# Patient Record
Sex: Female | Born: 1960
Health system: Southern US, Community
[De-identification: ages and names within clinical notes are randomized; demographics above are authoritative.]

## PROBLEM LIST (undated history)

## (undated) DIAGNOSIS — G8929 Other chronic pain: Secondary | ICD-10-CM

## (undated) DIAGNOSIS — J40 Bronchitis, not specified as acute or chronic: Secondary | ICD-10-CM

## (undated) DIAGNOSIS — M65311 Trigger thumb, right thumb: Secondary | ICD-10-CM

## (undated) DIAGNOSIS — E079 Disorder of thyroid, unspecified: Secondary | ICD-10-CM

## (undated) DIAGNOSIS — I10 Essential (primary) hypertension: Secondary | ICD-10-CM

## (undated) DIAGNOSIS — K760 Fatty (change of) liver, not elsewhere classified: Secondary | ICD-10-CM

## (undated) DIAGNOSIS — I639 Cerebral infarction, unspecified: Secondary | ICD-10-CM

## (undated) DIAGNOSIS — M549 Dorsalgia, unspecified: Secondary | ICD-10-CM

## (undated) DIAGNOSIS — E039 Hypothyroidism, unspecified: Secondary | ICD-10-CM

## (undated) DIAGNOSIS — N189 Chronic kidney disease, unspecified: Secondary | ICD-10-CM

## (undated) DIAGNOSIS — M1990 Unspecified osteoarthritis, unspecified site: Secondary | ICD-10-CM

## (undated) HISTORY — DX: Fatty (change of) liver, not elsewhere classified: K76.0

## (undated) HISTORY — PX: OOPHORECTOMY: SHX86

---

## 1991-10-28 HISTORY — PX: BACK SURGERY: SHX140

## 1998-03-28 ENCOUNTER — Emergency Department (HOSPITAL_COMMUNITY): Admission: EM | Admit: 1998-03-28 | Discharge: 1998-03-28 | Payer: Self-pay | Admitting: Emergency Medicine

## 1998-09-13 ENCOUNTER — Inpatient Hospital Stay (HOSPITAL_COMMUNITY): Admission: AD | Admit: 1998-09-13 | Discharge: 1998-09-13 | Payer: Self-pay | Admitting: Obstetrics & Gynecology

## 1998-11-30 ENCOUNTER — Emergency Department (HOSPITAL_COMMUNITY): Admission: EM | Admit: 1998-11-30 | Discharge: 1998-11-30 | Payer: Self-pay | Admitting: Internal Medicine

## 1998-11-30 ENCOUNTER — Encounter: Payer: Self-pay | Admitting: Internal Medicine

## 1999-01-16 ENCOUNTER — Encounter: Payer: Self-pay | Admitting: Emergency Medicine

## 1999-01-16 ENCOUNTER — Emergency Department (HOSPITAL_COMMUNITY): Admission: EM | Admit: 1999-01-16 | Discharge: 1999-01-16 | Payer: Self-pay | Admitting: Emergency Medicine

## 1999-11-08 ENCOUNTER — Emergency Department (HOSPITAL_COMMUNITY): Admission: EM | Admit: 1999-11-08 | Discharge: 1999-11-08 | Payer: Self-pay | Admitting: Emergency Medicine

## 1999-11-08 ENCOUNTER — Encounter: Payer: Self-pay | Admitting: Emergency Medicine

## 1999-12-09 ENCOUNTER — Encounter: Admission: RE | Admit: 1999-12-09 | Discharge: 1999-12-09 | Payer: Self-pay | Admitting: Internal Medicine

## 1999-12-24 ENCOUNTER — Ambulatory Visit (HOSPITAL_COMMUNITY): Admission: RE | Admit: 1999-12-24 | Discharge: 1999-12-24 | Payer: Self-pay | Admitting: *Deleted

## 2000-04-06 ENCOUNTER — Encounter: Payer: Self-pay | Admitting: Obstetrics

## 2000-04-06 ENCOUNTER — Inpatient Hospital Stay (HOSPITAL_COMMUNITY): Admission: AD | Admit: 2000-04-06 | Discharge: 2000-04-06 | Payer: Self-pay | Admitting: Obstetrics

## 2000-04-14 ENCOUNTER — Encounter: Payer: Self-pay | Admitting: Emergency Medicine

## 2000-04-14 ENCOUNTER — Emergency Department (HOSPITAL_COMMUNITY): Admission: EM | Admit: 2000-04-14 | Discharge: 2000-04-14 | Payer: Self-pay | Admitting: Emergency Medicine

## 2000-04-18 ENCOUNTER — Inpatient Hospital Stay (HOSPITAL_COMMUNITY): Admission: AD | Admit: 2000-04-18 | Discharge: 2000-04-18 | Payer: Self-pay | Admitting: Obstetrics

## 2000-06-09 ENCOUNTER — Encounter: Admission: RE | Admit: 2000-06-09 | Discharge: 2000-06-09 | Payer: Self-pay | Admitting: Obstetrics & Gynecology

## 2000-06-10 ENCOUNTER — Encounter: Admission: RE | Admit: 2000-06-10 | Discharge: 2000-06-10 | Payer: Self-pay | Admitting: Hematology and Oncology

## 2000-07-20 ENCOUNTER — Encounter: Payer: Self-pay | Admitting: Obstetrics

## 2000-07-21 ENCOUNTER — Ambulatory Visit (HOSPITAL_COMMUNITY): Admission: RE | Admit: 2000-07-21 | Discharge: 2000-07-21 | Payer: Self-pay | Admitting: Obstetrics

## 2000-07-21 ENCOUNTER — Encounter (INDEPENDENT_AMBULATORY_CARE_PROVIDER_SITE_OTHER): Payer: Self-pay | Admitting: Specialist

## 2000-08-04 ENCOUNTER — Encounter: Admission: RE | Admit: 2000-08-04 | Discharge: 2000-08-04 | Payer: Self-pay | Admitting: Obstetrics & Gynecology

## 2002-03-16 ENCOUNTER — Emergency Department (HOSPITAL_COMMUNITY): Admission: EM | Admit: 2002-03-16 | Discharge: 2002-03-16 | Payer: Self-pay | Admitting: Emergency Medicine

## 2003-03-16 ENCOUNTER — Inpatient Hospital Stay (HOSPITAL_COMMUNITY): Admission: AD | Admit: 2003-03-16 | Discharge: 2003-03-16 | Payer: Self-pay | Admitting: Obstetrics

## 2003-03-30 ENCOUNTER — Encounter: Payer: Self-pay | Admitting: Obstetrics

## 2003-03-30 ENCOUNTER — Inpatient Hospital Stay (HOSPITAL_COMMUNITY): Admission: AD | Admit: 2003-03-30 | Discharge: 2003-04-01 | Payer: Self-pay | Admitting: Obstetrics

## 2003-03-30 ENCOUNTER — Ambulatory Visit (HOSPITAL_COMMUNITY): Admission: RE | Admit: 2003-03-30 | Discharge: 2003-03-30 | Payer: Self-pay | Admitting: Obstetrics

## 2003-09-04 ENCOUNTER — Emergency Department (HOSPITAL_COMMUNITY): Admission: EM | Admit: 2003-09-04 | Discharge: 2003-09-04 | Payer: Self-pay | Admitting: Emergency Medicine

## 2004-03-29 ENCOUNTER — Ambulatory Visit (HOSPITAL_COMMUNITY): Admission: RE | Admit: 2004-03-29 | Discharge: 2004-03-29 | Payer: Self-pay | Admitting: Obstetrics

## 2004-08-08 ENCOUNTER — Emergency Department (HOSPITAL_COMMUNITY): Admission: EM | Admit: 2004-08-08 | Discharge: 2004-08-09 | Payer: Self-pay

## 2004-12-04 ENCOUNTER — Ambulatory Visit (HOSPITAL_COMMUNITY): Admission: RE | Admit: 2004-12-04 | Discharge: 2004-12-04 | Payer: Self-pay | Admitting: Obstetrics

## 2004-12-07 ENCOUNTER — Emergency Department (HOSPITAL_COMMUNITY): Admission: AD | Admit: 2004-12-07 | Discharge: 2004-12-07 | Payer: Self-pay | Admitting: Family Medicine

## 2005-05-02 ENCOUNTER — Emergency Department (HOSPITAL_COMMUNITY): Admission: EM | Admit: 2005-05-02 | Discharge: 2005-05-02 | Payer: Self-pay | Admitting: Family Medicine

## 2005-06-05 ENCOUNTER — Emergency Department (HOSPITAL_COMMUNITY): Admission: EM | Admit: 2005-06-05 | Discharge: 2005-06-05 | Payer: Self-pay | Admitting: Emergency Medicine

## 2006-01-04 ENCOUNTER — Emergency Department (HOSPITAL_COMMUNITY): Admission: EM | Admit: 2006-01-04 | Discharge: 2006-01-04 | Payer: Self-pay | Admitting: Family Medicine

## 2006-06-02 ENCOUNTER — Emergency Department (HOSPITAL_COMMUNITY): Admission: EM | Admit: 2006-06-02 | Discharge: 2006-06-02 | Payer: Self-pay | Admitting: Family Medicine

## 2006-09-01 ENCOUNTER — Ambulatory Visit (HOSPITAL_COMMUNITY): Admission: RE | Admit: 2006-09-01 | Discharge: 2006-09-01 | Payer: Self-pay | Admitting: Obstetrics

## 2006-12-25 ENCOUNTER — Emergency Department (HOSPITAL_COMMUNITY): Admission: EM | Admit: 2006-12-25 | Discharge: 2006-12-25 | Payer: Self-pay | Admitting: Emergency Medicine

## 2007-04-08 ENCOUNTER — Emergency Department (HOSPITAL_COMMUNITY): Admission: EM | Admit: 2007-04-08 | Discharge: 2007-04-08 | Payer: Self-pay | Admitting: Emergency Medicine

## 2007-04-27 ENCOUNTER — Ambulatory Visit (HOSPITAL_COMMUNITY): Admission: RE | Admit: 2007-04-27 | Discharge: 2007-04-27 | Payer: Self-pay | Admitting: Obstetrics

## 2007-08-26 ENCOUNTER — Emergency Department (HOSPITAL_COMMUNITY): Admission: EM | Admit: 2007-08-26 | Discharge: 2007-08-26 | Payer: Self-pay | Admitting: Emergency Medicine

## 2007-11-10 ENCOUNTER — Emergency Department (HOSPITAL_COMMUNITY): Admission: EM | Admit: 2007-11-10 | Discharge: 2007-11-10 | Payer: Self-pay | Admitting: Emergency Medicine

## 2008-01-13 ENCOUNTER — Ambulatory Visit (HOSPITAL_COMMUNITY): Admission: RE | Admit: 2008-01-13 | Discharge: 2008-01-13 | Payer: Self-pay | Admitting: Obstetrics

## 2008-02-02 ENCOUNTER — Ambulatory Visit: Payer: Self-pay | Admitting: Gastroenterology

## 2008-02-16 ENCOUNTER — Ambulatory Visit: Payer: Self-pay | Admitting: Gastroenterology

## 2008-02-16 ENCOUNTER — Encounter: Payer: Self-pay | Admitting: Gastroenterology

## 2008-04-22 ENCOUNTER — Emergency Department (HOSPITAL_COMMUNITY): Admission: EM | Admit: 2008-04-22 | Discharge: 2008-04-22 | Payer: Self-pay | Admitting: Emergency Medicine

## 2008-07-14 ENCOUNTER — Emergency Department (HOSPITAL_COMMUNITY): Admission: EM | Admit: 2008-07-14 | Discharge: 2008-07-14 | Payer: Self-pay | Admitting: Emergency Medicine

## 2008-09-23 ENCOUNTER — Emergency Department (HOSPITAL_COMMUNITY): Admission: EM | Admit: 2008-09-23 | Discharge: 2008-09-24 | Payer: Self-pay | Admitting: Emergency Medicine

## 2008-10-24 ENCOUNTER — Emergency Department (HOSPITAL_COMMUNITY): Admission: EM | Admit: 2008-10-24 | Discharge: 2008-10-24 | Payer: Self-pay | Admitting: Family Medicine

## 2008-12-13 ENCOUNTER — Emergency Department (HOSPITAL_COMMUNITY): Admission: EM | Admit: 2008-12-13 | Discharge: 2008-12-13 | Payer: Self-pay | Admitting: Family Medicine

## 2009-01-18 ENCOUNTER — Ambulatory Visit (HOSPITAL_COMMUNITY): Admission: RE | Admit: 2009-01-18 | Discharge: 2009-01-18 | Payer: Self-pay | Admitting: Internal Medicine

## 2009-02-06 ENCOUNTER — Emergency Department (HOSPITAL_COMMUNITY): Admission: EM | Admit: 2009-02-06 | Discharge: 2009-02-06 | Payer: Self-pay | Admitting: Emergency Medicine

## 2009-02-06 ENCOUNTER — Ambulatory Visit (HOSPITAL_COMMUNITY): Admission: RE | Admit: 2009-02-06 | Discharge: 2009-02-06 | Payer: Self-pay | Admitting: Obstetrics

## 2009-03-15 ENCOUNTER — Emergency Department (HOSPITAL_COMMUNITY): Admission: EM | Admit: 2009-03-15 | Discharge: 2009-03-15 | Payer: Self-pay | Admitting: Emergency Medicine

## 2009-03-18 ENCOUNTER — Emergency Department (HOSPITAL_COMMUNITY): Admission: EM | Admit: 2009-03-18 | Discharge: 2009-03-18 | Payer: Self-pay | Admitting: Emergency Medicine

## 2009-03-30 ENCOUNTER — Emergency Department (HOSPITAL_COMMUNITY): Admission: EM | Admit: 2009-03-30 | Discharge: 2009-03-30 | Payer: Self-pay | Admitting: Family Medicine

## 2009-12-10 ENCOUNTER — Emergency Department (HOSPITAL_COMMUNITY): Admission: EM | Admit: 2009-12-10 | Discharge: 2009-12-10 | Payer: Self-pay | Admitting: Emergency Medicine

## 2010-03-07 ENCOUNTER — Emergency Department (HOSPITAL_COMMUNITY): Admission: EM | Admit: 2010-03-07 | Discharge: 2010-03-07 | Payer: Self-pay | Admitting: Emergency Medicine

## 2010-04-18 ENCOUNTER — Encounter: Admission: RE | Admit: 2010-04-18 | Discharge: 2010-04-18 | Payer: Self-pay | Admitting: Orthopaedic Surgery

## 2010-05-07 ENCOUNTER — Emergency Department (HOSPITAL_COMMUNITY): Admission: EM | Admit: 2010-05-07 | Discharge: 2010-05-07 | Payer: Self-pay | Admitting: Emergency Medicine

## 2010-05-16 ENCOUNTER — Ambulatory Visit (HOSPITAL_COMMUNITY): Admission: RE | Admit: 2010-05-16 | Discharge: 2010-05-16 | Payer: Self-pay | Admitting: Obstetrics

## 2010-06-07 ENCOUNTER — Emergency Department (HOSPITAL_COMMUNITY): Admission: EM | Admit: 2010-06-07 | Discharge: 2010-06-07 | Payer: Self-pay | Admitting: Emergency Medicine

## 2010-10-09 ENCOUNTER — Encounter: Admission: RE | Admit: 2010-10-09 | Payer: Self-pay | Source: Home / Self Care | Admitting: Internal Medicine

## 2010-10-30 ENCOUNTER — Encounter: Admission: RE | Admit: 2010-10-30 | Payer: Self-pay | Source: Home / Self Care | Admitting: Internal Medicine

## 2010-11-17 ENCOUNTER — Encounter: Payer: Self-pay | Admitting: Obstetrics

## 2010-11-18 ENCOUNTER — Emergency Department (HOSPITAL_COMMUNITY)
Admission: EM | Admit: 2010-11-18 | Discharge: 2010-11-18 | Payer: Self-pay | Source: Home / Self Care | Admitting: Emergency Medicine

## 2011-01-12 LAB — CBC
HCT: 39.7 % (ref 36.0–46.0)
Hemoglobin: 13.9 g/dL (ref 12.0–15.0)
MCH: 31.9 pg (ref 26.0–34.0)
MCHC: 35 g/dL (ref 30.0–36.0)
MCV: 91 fL (ref 78.0–100.0)
Platelets: 244 10*3/uL (ref 150–400)
RBC: 4.36 MIL/uL (ref 3.87–5.11)
RDW: 13.1 % (ref 11.5–15.5)
WBC: 5.8 10*3/uL (ref 4.0–10.5)

## 2011-01-12 LAB — DIFFERENTIAL
Basophils Absolute: 0 10*3/uL (ref 0.0–0.1)
Basophils Relative: 1 % (ref 0–1)
Eosinophils Absolute: 0.1 10*3/uL (ref 0.0–0.7)
Eosinophils Relative: 2 % (ref 0–5)
Lymphocytes Relative: 54 % — ABNORMAL HIGH (ref 12–46)
Lymphs Abs: 3.1 10*3/uL (ref 0.7–4.0)
Monocytes Absolute: 0.6 10*3/uL (ref 0.1–1.0)
Monocytes Relative: 10 % (ref 3–12)
Neutro Abs: 1.9 10*3/uL (ref 1.7–7.7)
Neutrophils Relative %: 33 % — ABNORMAL LOW (ref 43–77)

## 2011-01-12 LAB — BASIC METABOLIC PANEL
BUN: 10 mg/dL (ref 6–23)
CO2: 29 mEq/L (ref 19–32)
Calcium: 9.1 mg/dL (ref 8.4–10.5)
Chloride: 106 mEq/L (ref 96–112)
Creatinine, Ser: 0.86 mg/dL (ref 0.4–1.2)
GFR calc Af Amer: >60 mL/min (ref >60)
GFR calc non Af Amer: >60 mL/min (ref >60)
Glucose, Bld: 141 mg/dL — ABNORMAL HIGH (ref 70–99)
Potassium: 3.5 mEq/L (ref 3.5–5.1)
Sodium: 140 mEq/L (ref 135–145)

## 2011-01-17 LAB — POCT I-STAT, CHEM 8
BUN: 10 mg/dL (ref 6–23)
Calcium, Ion: 1.19 mmol/L (ref 1.12–1.32)
Chloride: 102 mEq/L (ref 96–112)
Creatinine, Ser: 1 mg/dL (ref 0.4–1.2)
Glucose, Bld: 153 mg/dL — ABNORMAL HIGH (ref 70–99)
HCT: 48 % — ABNORMAL HIGH (ref 36.0–46.0)
Hemoglobin: 16.3 g/dL — ABNORMAL HIGH (ref 12.0–15.0)
Potassium: 4.6 mEq/L (ref 3.5–5.1)
Sodium: 136 mEq/L (ref 135–145)
TCO2: 30 mmol/L (ref 0–100)

## 2011-01-17 LAB — URINALYSIS, ROUTINE W REFLEX MICROSCOPIC
Bilirubin Urine: NEGATIVE
Glucose, UA: NEGATIVE mg/dL
Hgb urine dipstick: NEGATIVE
Ketones, ur: NEGATIVE mg/dL
Nitrite: NEGATIVE
Protein, ur: NEGATIVE mg/dL
Specific Gravity, Urine: 1.02 (ref 1.005–1.030)
Urobilinogen, UA: 0.2 mg/dL (ref 0.0–1.0)
pH: 5.5 (ref 5.0–8.0)

## 2011-03-11 NOTE — Letter (Signed)
February 02, 2008    Charles A. Clearance Coots, M.D.  [address]  [place], [place]  [address]   RE:  NEKESHA, FONT  MRN:  [id number]  /  DOB:  October 24, 1961   Dear Dr. Clearance Coots:   Upon your kind referral, I had the pleasure of evaluating your patient  and I am pleased to offer my findings.  I saw Ms. Wyble in the office  today.  Enclosed is a copy of my progress note that details my findings  and recommendations.   Thank you for the opportunity to participate in your patient's care.    Sincerely,      Barbette Hair. Arlyce Dice, MD,FACG  Electronically Signed    RDK/MedQ  DD: 02/02/2008  DT: 02/02/2008  Job #: [id number]

## 2011-03-11 NOTE — Assessment & Plan Note (Signed)
[facility]                         GASTROENTEROLOGY OFFICE NOTE   MAKAI, AGOSTINELLI                      MRN:          [id number]  DATE:02/02/2008                            DOB:          1961-05-08    REASON FOR CONSULTATION:  Abdominal pain.   HISTORY OF PRESENT ILLNESS:  Margaret Webster is a 50 year old African American  female referred through the courtesy of Dr. Clearance Coots for evaluation.  For  several weeks, she has been complaining of mid abdominal pain.  Pain is  invariably worse postprandially.  It is poorly described, but appears  like an aching-type pain that radiates across both sides but not through  to the back.  She has had intermittent pyrosis and nausea.  There has  been no change in her bowel habits.  She has mild chronic constipation.  She has taken Zantac and short courses of PPI therapy with relief, only  for the pain to return.  She notes that the pain has occurred when she  has had two different sets of steroid injections.  She does not take  nonsteroidals because of stomach upset.  There is no history of melena  or hematochezia.  She also complains of rectal pain which she attributes  to hemorrhoids.  She has had no bleeding.   PAST MEDICAL HISTORY:  1. Arthritis.  2. Sleep apnea.  3. Slipped disk and is on disability.  4. Status post oophorectomy and back surgery.   FAMILY HISTORY:  Pertinent for daughter with ovarian cancer.   ALLERGIES:  Ibuprofen and Naprosyn.   MEDICATIONS:  Citalopram daily.   SOCIAL HISTORY:  She is single and on disability.  She smokes half a  pack a day.  She drinks rarely.   REVIEW OF SYSTEMS:  Pertinent for feet swelling, joint pains, sleeping  problems, back pain, headaches, anxiety, depression, muscle pains.  Remainder of review of systems was negative.   PHYSICAL EXAMINATION:  VITAL SIGNS:  Pulse 106, blood pressure 120/68,  weight 229.  HEENT: EOMI.  PERRLA.  Sclerae are anicteric.   Conjunctivae are pink.  NECK:  Supple without thyromegaly, adenopathy or carotid bruits.  CHEST:  Clear to auscultation and percussion without adventitious  sounds.  CARDIAC:  Regular rhythm; normal S1 S2.  There are no murmurs, gallops  or rubs.  ABDOMEN:  Bowel sounds are normoactive.  Abdomen is soft, nontender and  nondistended.  There are no abdominal masses, tenderness, splenic  enlargement or hepatomegaly.  EXTREMITIES:  Full range of motion.  No cyanosis, clubbing or edema.  RECTAL:  External hemorrhoids.  There are no masses.  Stool is Hemoccult  negative.  MUSCULOSKELETAL:  Without deformities.  NEUROLOGICAL:  Alert and oriented x3.  No focal abnormalities.   IMPRESSION:  1. Dyspepsia.  This could be due to ulcer or nonulcerative dyspepsia.  2. GERD.  3. Symptomatic hemorrhoids.   RECOMMENDATION:  1. Upper endoscopy.  2. Begin Protonix 40 mg daily.  3. Anusol-HC suppositories.     Barbette Hair. Arlyce Dice, MD,FACG  Electronically Signed    RDK/MedQ  DD: 02/02/2008  DT: 02/02/2008  Job #:  [id number]   cc:   Fleet Contras, M.D.  Charles A. Clearance Coots, M.D.

## 2011-03-11 NOTE — Letter (Signed)
February 02, 2008    [name]   RE:  CORALYNN, GAONA  MRN:  [id number]  /  DOB:  12-24-1960   Dear Ms. Manson Passey:   It is my pleasure to have treated you recently as a new patient in my  office.  I appreciate your confidence and the opportunity to participate  in your care.   Since I do have a busy inpatient endoscopy schedule and office schedule,  my office hours vary weekly.  I am, however, available for emergency  calls every day through my office.  If I cannot promptly meet an urgent  office appointment, another one of our gastroenterologists will be able  to assist you.   My well-trained staff are prepared to help you at all times.  For  emergencies after office hours, a physician from our gastroenterology  section is always available through my 24-hour answering service.   While you are under my care, I encourage discussion of your questions  and concerns, and I will be happy to return your calls as soon as I am  available.   Once again, I welcome you as a new patient and I look forward to a happy  and healthy relationship.    Sincerely,      Barbette Hair. Arlyce Dice, MD,FACG  Electronically Signed   RDK/MedQ  DD: 02/02/2008  DT: 02/02/2008  Job #: [id number]

## 2011-03-14 NOTE — Op Note (Signed)
NAME:  Margaret Webster, Margaret Webster                         ACCOUNT NO.:  [id number]   MEDICAL RECORD NO.:  [id number]                   PATIENT TYPE:  AMB   LOCATION:  SDC                                  FACILITY:  [facility]   PHYSICIAN:  Charles A. Clearance Coots, M.D.             DATE OF BIRTH:  07-01-1961   DATE OF PROCEDURE:  03/29/2004  DATE OF DISCHARGE:                                 OPERATIVE REPORT   PREOPERATIVE DIAGNOSIS:  Metrorrhagia.   POSTOPERATIVE DIAGNOSIS:  Metrorrhagia.   PROCEDURE:  Bipolar endometrial ablation (Novasure).   SURGEON:  Charles A. Clearance Coots, M.D.   ANESTHESIA:  General.   ESTIMATED BLOOD LOSS:  Negligible.   COMPLICATIONS:  None.   SPECIMENS:  None.   OPERATION:  The patient was brought to the operating room and after  satisfactory general endotracheal anesthesia, the legs were brought up in  stirrups and the vagina was prepped and draped in the usual sterile fashion.  The urinary bladder was emptied of approximately 100 mL of clear urine.  Bimanual examination revealed the uterus to be about eight weeks size,  retroverted.  A sterile speculum was inserted in the vaginal vault and the  cervix was isolated.  The anterior lip of the cervix was grasped with a  single tooth tenaculum.  The uterine cavity was sounded to 8.5 cm.  The  Hegar dilator was used to measure the distance to the internal os which was  3 cm.  Hysteroscopic survey was then performed.  The bipolar endometrial  ablation technique was then performed in a routine fashion after  hysteroscopy.  The power used was 79 watts and time of ablation was 60  seconds.  The patient tolerated the procedure well.  A post ablation  hysteroscopy was then performed.  All instruments were retired.  The patient  tolerated the procedure well and was transferred to the recovery room in  satisfactory condition.                                               Charles A. Clearance Coots, M.D.    CAH/MEDQ  D:  03/29/2004  T:   03/29/2004  Job:  [id number]

## 2011-03-14 NOTE — Op Note (Signed)
[facility]  Patient:    Margaret Webster, Margaret Webster                        MRN: [id number] Proc. Date: 07/21/00 Attending:  Kathreen Cosier, M.D.                           Operative Report  PREOPERATIVE DIAGNOSIS:       Dysfunctional uterine bleeding.  PROCEDURE:                    Diagnostic dilatation and curettage.  SURGEON:                      Kathreen Cosier, M.D.   DESCRIPTION OF PROCEDURE:     Using MAC with the patient in the lithotomy position, the perineum and vagina were prepped and draped.  The bladder was entered with a straight catheter.  A bimanual examination revealed a top normal sized uterus ______ and adnexa.   A weighted speculum was placed in the vagina.  The cervix was injected at 3, 9, and 12 oclock with 1% Xylocaine, a total of 5%.  The anterior lip of the cervix was grasped with the tenaculum.  The endocervix was curetted.  A small amount of tissue was obtained.  The endometrial cavity was sounded to 9 cm.  The cervix was dilated with #25 Shawnie Pons and the endometrium curetted, and a small amount of tissue obtained.  Polyp forceps were inserted.  No polyps were obtained.  The patient tolerated the procedure well and was taken to the recovery room in good condition. DD:  07/21/00 TD:  07/21/00 Job: [id number]

## 2011-03-14 NOTE — Discharge Summary (Signed)
NAME:  Margaret Webster, Margaret Webster                         ACCOUNT NO.:  [id number]   MEDICAL RECORD NO.:  [id number]                   PATIENT TYPE:  INP   LOCATION:  9304                                 FACILITY:  [facility]   PHYSICIAN:  Charles A. Clearance Coots, M.D.             DATE OF BIRTH:  [birth year]   DATE OF ADMISSION:  03/30/2003  DATE OF DISCHARGE:  04/01/2003                                 DISCHARGE SUMMARY   ADMITTING DIAGNOSES:  Acute pelvic inflammatory disease.   DISCHARGE DIAGNOSES:  Acute pelvic inflammatory disease.   DISPOSITION:  Discharged home improved after IV antibiotic therapy.   REASON FOR ADMISSION:  A 50 year old black female G1, P1.  Last menstrual  period February 24, 2003.  Last Pap smear February 13, 2003 within normal limits.  Last mammogram February 13, 2002 was normal.  The patient presented to the  [facility] for complaint of abnormal bleeding during the month of  April.  Periods are usually regular but she had onset of her period on January 29, 2003 and bled until February 24, 2003.  She also complained of right lower  quadrant pain.  She is no contraception.   PAST SURGICAL HISTORY:  1. Back surgery.  2. Left ovarian cystectomy.  3. D&C.   PAST MEDICAL HISTORY:  1. Irritable bowel syndrome.  2. Ear infection.   MEDICATIONS:  1. Zelnorm.  2. Amoxicillin.  3. Multivitamins.  4. Decongestant for ear infection.   ALLERGIES:  No known drug allergies.   SOCIAL HISTORY:  Single.  Positive for tobacco and alcohol use.  Negative  for recreational drug use.   FAMILY HISTORY:  Positive for stroke, diabetes, Alzheimer's disease.  Negative for gynecologic cancer.   REVIEW OF SYSTEMS:  Noncontributory except for history of present illness.   PHYSICAL EXAMINATION:  GENERAL:  Well-nourished, well-developed black female  in no acute distress.  VITAL SIGNS:  Temperature 98.5, pulse 92, blood pressure 130/92.  HEENT:  Within normal limits.  LUNGS:  Clear to  auscultation bilaterally.  HEART:  Regular rate and rhythm.  ABDOMEN:  Soft.  Positive right lower quadrant tenderness.  No masses  appreciated.  PELVIC:  Normal external female genitalia.  Vaginal mucosa normal.  Cervix  is parous.  Scant amount of menstrual like blood from the cervical os.  Endometrial biopsy was done at the time of examination.  GC and Chlamydia  cultures were done.  Uterus was normal size, shape, and contour, mid  position, nontender.  The adnexa positive for right adnexal tenderness.  No  masses were appreciated.   LABORATORIES:  GC culture was positive.  Ultrasound examination revealed  normal uterus and ovaries and a scant amount of free fluid around the right  ovary.   IMPRESSION:  1. Acute pelvic inflammatory disease.  2. Menometrorrhagia.   PLAN:  Admit for IV antibiotic therapy.   ADMITTING LABORATORY VALUES:  Hemoglobin 13.9, hematocrit  39.7, white blood  cell count 8200, platelets 324,000.  Comprehensive metabolic panel was  within normal limits.  GC culture was positive.   HOSPITAL COURSE:  The patient was admitted and received IV antibiotic  therapy.  She was started on Unasyn 3 g IV q.6h. along with doxycycline 100  mg p.o. b.i.d.  Responded well to IV antibiotic therapy and was discharged  home on hospital day #2 much improved, nontender on examination to complete  a 10-day course of p.o. antibiotic therapy with doxycycline and Flagyl.   DISCHARGE DISPOSITION:   DISCHARGE MEDICATIONS:  1. Doxycycline 100 mg p.o. b.i.d. for eight days.  2. Flagyl 500 mg b.i.d. for eight days.   FOLLOWUP:  The patient is to follow up at the [facility] for a  reexamination in two to three days.   DISCHARGE INSTRUCTIONS:  Routine written instructions were given for  gynecologic discharge.                                               Charles A. Clearance Coots, M.D.    CAH/MEDQ  D:  06/15/2003  T:  06/15/2003  Job:  [id number]

## 2011-03-14 NOTE — H&P (Signed)
NAME:  Margaret Webster, Margaret Webster                         ACCOUNT NO.:  [id number]   MEDICAL RECORD NO.:  [id number]                   PATIENT TYPE:  OUT   LOCATION:  ULT                                  [facility]ILITY:  [facility]   PHYSICIAN:  Charles A. Clearance Coots, M.D.             DATE OF BIRTH:  July 07, 1961   DATE OF ADMISSION:  03/30/2003  DATE OF DISCHARGE:                                HISTORY & PHYSICAL   HISTORY OF PRESENT ILLNESS:  A 50 year old black female, G1, P1, last  menstrual period February 24, 2003, last Pap smear February 13, 2003, normal.  Last mammogram February 13, 2002, normal, presents to the [facility]  for history of abnormal bleeding during the month of April.  Her periods are  usually regular, but she had onset of her period on January 29, 2003 and bled  until February 24, 2003.  She also complained of right lower quadrant pain.  Contraception is none.   PAST MEDICAL HISTORY:  Surgeries:  Back surgery, left ovarian cystectomy,  D&C.  Illnesses:  Irritable bowel syndrome, ear infection.   MEDICATIONS:  Zelnorm, amoxicillin, multivitamins, and a decongestant for  ear infection.   ALLERGIES:  No known drug allergies.   SOCIAL HISTORY:  Single.  Positive tobacco and ethanol use.  Negative drug  use.   FAMILY HISTORY:  Positive for stroke, diabetes, Alzheimer's disease.  Negative for gynecologic cancer.   REVIEW OF SYSTEMS:  Noncontributory except per history of present illness.   PHYSICAL EXAMINATION:  GENERAL:  Well nourished, well developed, black  female in no acute distress.  VITAL SIGNS:  Temperature 98.5, pulse 92,  blood pressure 130/92.  HEENT:  Within normal limits.  LUNGS:  Clear to auscultation bilaterally.  HEART:  Regular, rate, and rhythm.  ABDOMEN:  Soft, positive right lower quadrant tenderness.  No masses  appreciated.  PELVIC:  Normal external female genitalia.  Vaginal mucosa normal.  Cervix  parous.  Scant amount of menstrual period from cervical os.   Endometrial  biopsy was done at the time of exam.  GC and Chlamydia cultures were done.  Uterus is normal size, shape, and contour, mid position, nontender.  Adnexa  with positive right adnexal tenderness.  No masses appreciated.   LABORATORY VALUES:  GC culture was positive.  The patient received an  ultrasound today, which also showed normal uterus and ovaries and scant  amount of free fluid around the right ovary.    IMPRESSION:  1. Dysfunctional uterine bleeding (menometrorrhagia).  2. Positive gonorrhea culture and evidence of acute pelvic inflammatory     disease on exam.   PLAN:  The patient was therefore admitted for I.V. antibiotic therapy for  acute pelvic inflammatory disease.  Charles A. Clearance Coots, M.D.    CAH/MEDQ  D:  03/30/2003  T:  03/30/2003  Job:  [id number]

## 2011-06-23 ENCOUNTER — Other Ambulatory Visit: Payer: Self-pay | Admitting: Obstetrics

## 2011-06-23 DIAGNOSIS — Z1231 Encounter for screening mammogram for malignant neoplasm of breast: Secondary | ICD-10-CM

## 2011-06-25 ENCOUNTER — Ambulatory Visit (HOSPITAL_COMMUNITY)
Admission: RE | Admit: 2011-06-25 | Discharge: 2011-06-25 | Disposition: A | Discharge status: Home / Self Care | Payer: Medicare Other | Source: Ambulatory Visit | Attending: Obstetrics | Admitting: Obstetrics

## 2011-06-25 DIAGNOSIS — Z1231 Encounter for screening mammogram for malignant neoplasm of breast: Secondary | ICD-10-CM | POA: Insufficient documentation

## 2011-07-13 ENCOUNTER — Inpatient Hospital Stay (INDEPENDENT_AMBULATORY_CARE_PROVIDER_SITE_OTHER)
Admission: RE | Admit: 2011-07-13 | Discharge: 2011-07-13 | Disposition: A | Discharge status: Home / Self Care | Payer: Medicare Other | Source: Ambulatory Visit | Attending: Family Medicine | Admitting: Family Medicine

## 2011-07-13 DIAGNOSIS — S90129A Contusion of unspecified lesser toe(s) without damage to nail, initial encounter: Secondary | ICD-10-CM

## 2011-07-24 LAB — CBC
HCT: 39.6
Hemoglobin: 14
MCHC: 35.3
MCV: 91.6
Platelets: 264
RBC: 4.32
RDW: 12.7
WBC: 7.2

## 2011-07-24 LAB — DIFFERENTIAL
Basophils Absolute: 0
Basophils Relative: 1
Eosinophils Absolute: 0.1
Eosinophils Relative: 1
Lymphocytes Relative: 36
Lymphs Abs: 2.6
Monocytes Absolute: 0.6
Monocytes Relative: 8
Neutro Abs: 3.9
Neutrophils Relative %: 54

## 2011-07-24 LAB — COMPREHENSIVE METABOLIC PANEL
ALT: 21
AST: 22
Albumin: 3.9
Alkaline Phosphatase: 61
BUN: 11
CO2: 28
Calcium: 9.6
Chloride: 106
Creatinine, Ser: 0.84
GFR calc Af Amer: >60
GFR calc non Af Amer: >60
Glucose, Bld: 130 — ABNORMAL HIGH
Potassium: 4
Sodium: 141
Total Bilirubin: 0.9
Total Protein: 6.8

## 2011-07-24 LAB — URINALYSIS, ROUTINE W REFLEX MICROSCOPIC
Bilirubin Urine: NEGATIVE
Glucose, UA: NEGATIVE
Hgb urine dipstick: NEGATIVE
Ketones, ur: NEGATIVE
Nitrite: NEGATIVE
Protein, ur: NEGATIVE
Specific Gravity, Urine: 1.016
Urobilinogen, UA: 0.2
pH: 5.5

## 2011-07-24 LAB — LIPASE, BLOOD: Lipase: 61 — ABNORMAL HIGH

## 2011-07-24 LAB — URINE MICROSCOPIC-ADD ON

## 2011-07-24 LAB — POCT PREGNANCY, URINE
Operator id: 133351
Preg Test, Ur: NEGATIVE

## 2011-07-29 LAB — RAPID STREP SCREEN (MED CTR MEBANE ONLY): Streptococcus, Group A Screen (Direct): NEGATIVE

## 2011-08-06 LAB — DIFFERENTIAL
Basophils Absolute: 0.1
Basophils Relative: 1
Eosinophils Absolute: 0.1
Eosinophils Relative: 2
Lymphocytes Relative: 51 — ABNORMAL HIGH
Lymphs Abs: 2.7
Monocytes Absolute: 0.5
Monocytes Relative: 9
Neutro Abs: 1.9
Neutrophils Relative %: 37 — ABNORMAL LOW

## 2011-08-06 LAB — I-STAT 8, (EC8 V) (CONVERTED LAB)
Acid-Base Excess: 1
BUN: 14
Bicarbonate: 26.6 — ABNORMAL HIGH
Chloride: 107
Glucose, Bld: 151 — ABNORMAL HIGH
HCT: 43
Hemoglobin: 14.6
Operator id: 198171
Potassium: 4.4
Sodium: 137
TCO2: 28
pCO2, Ven: 43.3 — ABNORMAL LOW
pH, Ven: 7.396 — ABNORMAL HIGH

## 2011-08-06 LAB — CBC
HCT: 39.5
Hemoglobin: 13.9
MCHC: 35.3
MCV: 89.6
Platelets: 296
RBC: 4.41
RDW: 12.8
WBC: 5.2

## 2011-08-06 LAB — POCT I-STAT CREATININE
Creatinine, Ser: 0.7
Operator id: 198171

## 2011-08-06 LAB — POCT CARDIAC MARKERS
CKMB, poc: 1.3
Myoglobin, poc: 52.2
Operator id: 198171
Troponin i, poc: <0.05

## 2011-10-09 ENCOUNTER — Emergency Department (HOSPITAL_COMMUNITY)
Admission: EM | Admit: 2011-10-09 | Discharge: 2011-10-09 | Discharge status: Absent without leave | Payer: Medicare Other | Source: Home / Self Care

## 2011-10-31 ENCOUNTER — Encounter: Payer: Self-pay | Admitting: Gastroenterology

## 2011-11-04 ENCOUNTER — Emergency Department (HOSPITAL_COMMUNITY)
Admission: EM | Admit: 2011-11-04 | Discharge: 2011-11-04 | Disposition: A | Discharge status: Home / Self Care | Payer: Medicare Other | Attending: Emergency Medicine | Admitting: Emergency Medicine

## 2011-11-04 ENCOUNTER — Emergency Department (HOSPITAL_COMMUNITY): Payer: Medicare Other

## 2011-11-04 ENCOUNTER — Encounter: Payer: Self-pay | Admitting: Emergency Medicine

## 2011-11-04 DIAGNOSIS — M546 Pain in thoracic spine: Secondary | ICD-10-CM | POA: Insufficient documentation

## 2011-11-04 DIAGNOSIS — M545 Low back pain, unspecified: Secondary | ICD-10-CM | POA: Insufficient documentation

## 2011-11-04 DIAGNOSIS — R509 Fever, unspecified: Secondary | ICD-10-CM | POA: Insufficient documentation

## 2011-11-04 DIAGNOSIS — R05 Cough: Secondary | ICD-10-CM | POA: Insufficient documentation

## 2011-11-04 DIAGNOSIS — I10 Essential (primary) hypertension: Secondary | ICD-10-CM | POA: Insufficient documentation

## 2011-11-04 DIAGNOSIS — R059 Cough, unspecified: Secondary | ICD-10-CM | POA: Insufficient documentation

## 2011-11-04 DIAGNOSIS — M533 Sacrococcygeal disorders, not elsewhere classified: Secondary | ICD-10-CM | POA: Insufficient documentation

## 2011-11-04 DIAGNOSIS — G8929 Other chronic pain: Secondary | ICD-10-CM | POA: Insufficient documentation

## 2011-11-04 DIAGNOSIS — E119 Type 2 diabetes mellitus without complications: Secondary | ICD-10-CM | POA: Insufficient documentation

## 2011-11-04 DIAGNOSIS — IMO0001 Reserved for inherently not codable concepts without codable children: Secondary | ICD-10-CM | POA: Insufficient documentation

## 2011-11-04 DIAGNOSIS — R51 Headache: Secondary | ICD-10-CM | POA: Insufficient documentation

## 2011-11-04 DIAGNOSIS — M79609 Pain in unspecified limb: Secondary | ICD-10-CM | POA: Insufficient documentation

## 2011-11-04 DIAGNOSIS — M549 Dorsalgia, unspecified: Secondary | ICD-10-CM | POA: Insufficient documentation

## 2011-11-04 DIAGNOSIS — R070 Pain in throat: Secondary | ICD-10-CM | POA: Insufficient documentation

## 2011-11-04 DIAGNOSIS — Z79899 Other long term (current) drug therapy: Secondary | ICD-10-CM | POA: Insufficient documentation

## 2011-11-04 DIAGNOSIS — R6889 Other general symptoms and signs: Secondary | ICD-10-CM | POA: Insufficient documentation

## 2011-11-04 HISTORY — DX: Dorsalgia, unspecified: M54.9

## 2011-11-04 HISTORY — DX: Essential (primary) hypertension: I10

## 2011-11-04 HISTORY — DX: Other chronic pain: G89.29

## 2011-11-04 LAB — URINALYSIS, ROUTINE W REFLEX MICROSCOPIC
Bilirubin Urine: NEGATIVE
Glucose, UA: NEGATIVE mg/dL
Hgb urine dipstick: NEGATIVE
Ketones, ur: NEGATIVE mg/dL
Leukocytes, UA: NEGATIVE
Nitrite: NEGATIVE
Protein, ur: 100 mg/dL — AB
Specific Gravity, Urine: 1.023 (ref 1.005–1.030)
Urobilinogen, UA: 0.2 mg/dL (ref 0.0–1.0)
pH: 5 (ref 5.0–8.0)

## 2011-11-04 LAB — CBC
HCT: 39.4 % (ref 36.0–46.0)
Hemoglobin: 13.5 g/dL (ref 12.0–15.0)
MCH: 30.8 pg (ref 26.0–34.0)
MCHC: 34.3 g/dL (ref 30.0–36.0)
MCV: 89.7 fL (ref 78.0–100.0)
Platelets: 269 10*3/uL (ref 150–400)
RBC: 4.39 MIL/uL (ref 3.87–5.11)
RDW: 13.1 % (ref 11.5–15.5)
WBC: 10.4 10*3/uL (ref 4.0–10.5)

## 2011-11-04 LAB — DIFFERENTIAL
Basophils Absolute: 0 10*3/uL (ref 0.0–0.1)
Basophils Relative: 0 % (ref 0–1)
Eosinophils Absolute: 0.1 10*3/uL (ref 0.0–0.7)
Eosinophils Relative: 1 % (ref 0–5)
Lymphocytes Relative: 44 % (ref 12–46)
Lymphs Abs: 4.6 10*3/uL — ABNORMAL HIGH (ref 0.7–4.0)
Monocytes Absolute: 0.7 10*3/uL (ref 0.1–1.0)
Monocytes Relative: 7 % (ref 3–12)
Neutro Abs: 5 10*3/uL (ref 1.7–7.7)
Neutrophils Relative %: 48 % (ref 43–77)

## 2011-11-04 LAB — POCT I-STAT, CHEM 8
BUN: 22 mg/dL (ref 6–23)
Calcium, Ion: 1.21 mmol/L (ref 1.12–1.32)
Chloride: 104 mEq/L (ref 96–112)
Creatinine, Ser: 1.1 mg/dL (ref 0.50–1.10)
Glucose, Bld: 113 mg/dL — ABNORMAL HIGH (ref 70–99)
HCT: 40 % (ref 36.0–46.0)
Hemoglobin: 13.6 g/dL (ref 12.0–15.0)
Potassium: 4.5 mEq/L (ref 3.5–5.1)
Sodium: 139 mEq/L (ref 135–145)
TCO2: 28 mmol/L (ref 0–100)

## 2011-11-04 LAB — URINE MICROSCOPIC-ADD ON

## 2011-11-04 NOTE — ED Provider Notes (Signed)
History     CSN: [id number]  Arrival date & time 11/04/11  1457   First MD Initiated Contact with Patient 11/04/11 2015      Chief Complaint  Patient presents with  . Back Pain    (Consider location/radiation/quality/duration/timing/severity/associated sxs/prior treatment) HPI Comments: Patient has a history of chronic back pain.  This has been exacerbated over the last 4 days with flulike symptoms, myalgias, fever, headache, sore throat, runny nose, cough  Patient is a 51 y.o. female presenting with back pain. The history is provided by the patient.  Back Pain  This is a new problem. The current episode started 2 days ago. The problem occurs constantly. The problem has not changed since onset.The pain is associated with no known injury. The pain is present in the thoracic spine, lumbar spine, sacro-iliac joint and gluteal region. The pain is at a severity of 5/10. Associated symptoms include leg pain. Pertinent negatives include no chest pain, no fever, no perianal numbness, no dysuria, no pelvic pain and no weakness. She has tried nothing for the symptoms. Risk factors include obesity.    Past Medical History  Diagnosis Date  . Diabetes mellitus   . Chronic back pain   . Hypertension     History reviewed. No pertinent past surgical history.  History reviewed. No pertinent family history.  History  Substance Use Topics  . Smoking status: Current Everyday Smoker  . Smokeless tobacco: Not on file  . Alcohol Use: Yes    OB History    Grav Para Term Preterm Abortions TAB SAB Ect Mult Living                  Review of Systems  Constitutional: Negative for fever and chills.  HENT: Positive for rhinorrhea. Negative for neck stiffness.   Eyes: Negative.   Respiratory: Positive for cough. Negative for shortness of breath and wheezing.   Cardiovascular: Negative for chest pain.  Gastrointestinal: Negative for nausea and vomiting.  Genitourinary: Negative for dysuria and  pelvic pain.  Musculoskeletal: Positive for myalgias and back pain.  Skin: Negative.   Neurological: Negative for dizziness and weakness.  Psychiatric/Behavioral: Negative.     Allergies  Lipitor and Naproxen  Home Medications   Current Outpatient Rx  Name Route Sig Dispense Refill  . VITAMIN D-3 5000 UNITS PO TABS Oral Take 1 tablet by mouth daily.      Marland Kitchen DEXTROMETHORPHAN POLISTIREX ER 30 MG/5ML PO LQCR Oral Take 60 mg by mouth 2 (two) times daily as needed. For cough.     . EZETIMIBE-SIMVASTATIN 10-20 MG PO TABS Oral Take 1 tablet by mouth at bedtime.      Marland Kitchen LISINOPRIL 20 MG PO TABS Oral Take 20 mg by mouth daily.      Marland Kitchen LORATADINE 10 MG PO TABS Oral Take 10 mg by mouth daily.      Marland Kitchen METFORMIN HCL 500 MG PO TABS Oral Take 500 mg by mouth 2 (two) times daily with a meal.      . SENNOSIDES-DOCUSATE SODIUM 8.6-50 MG PO TABS Oral Take 2 tablets by mouth daily.        BP 105/64  Pulse 76  Temp(Src) 98.8 F (37.1 C) (Oral)  Resp 17  SpO2 91%  Physical Exam  Constitutional: She is oriented to person, place, and time. She appears well-developed and well-nourished.  HENT:  Head: Normocephalic.  Neck: Normal range of motion.  Cardiovascular: Normal rate.   Pulmonary/Chest: Effort normal.  Abdominal: Soft. Bowel  sounds are normal.  Musculoskeletal: Normal range of motion.  Neurological: She is alert and oriented to person, place, and time.  Skin: Skin is warm.  Psychiatric: She has a normal mood and affect.    ED Course  Procedures (including critical care time)  Labs Reviewed  URINALYSIS, ROUTINE W REFLEX MICROSCOPIC - Abnormal; Notable for the following:    APPearance HAZY (*)    Protein, ur 100 (*)    All other components within normal limits  URINE MICROSCOPIC-ADD ON - Abnormal; Notable for the following:    Squamous Epithelial / LPF MANY (*)    Bacteria, UA FEW (*)    All other components within normal limits  DIFFERENTIAL - Abnormal; Notable for the following:     Lymphs Abs 4.6 (*)    All other components within normal limits  POCT I-STAT, CHEM 8 - Abnormal; Notable for the following:    Glucose, Bld 113 (*)    All other components within normal limits  CBC  I-STAT, CHEM 8   Dg Chest 2 View  11/04/2011  *RADIOLOGY REPORT*  Clinical Data: Chest pain  CHEST - 2 VIEW  Comparison:  05/07/2010  Findings:  The heart size and mediastinal contours are within normal limits.  Both lungs are clear.  The visualized skeletal structures are unremarkable.  IMPRESSION: No active cardiopulmonary disease.  Original Report Authenticated By: Judie Petit. Ruel Favors, M.D.     1. Flu-like symptoms     Will obtain labs to assess for potassium level and her blood sugars.  She is diabetic has recently been on steroids as well as a chest x-ray  MDM  Flulike symptoms.  She is exacerbating her back pain.  She describes as cramping        Arman Filter, NP 11/04/11 2049  Arman Filter, NP 11/04/11 2049  Arman Filter, NP 11/04/11 2229

## 2011-11-04 NOTE — ED Notes (Signed)
Pt c/o bilateral mid back pain that is tight muscle feeling; pt sts pain around to right hip and down leg; pt sts urinary frequency recently

## 2011-11-04 NOTE — ED Notes (Signed)
Pt c/o back pain with radiation to legs since Sunday, ambulates with no difficulty to dep

## 2011-11-04 NOTE — ED Provider Notes (Signed)
Medical screening examination/treatment/procedure(s) were performed by non-physician practitioner and as supervising physician I was immediately available for consultation/collaboration.   Pecola Haxton, MD 11/04/11 2333 

## 2011-11-07 ENCOUNTER — Encounter: Payer: Self-pay | Admitting: Gastroenterology

## 2011-11-13 ENCOUNTER — Ambulatory Visit: Payer: Medicare Other | Admitting: Gastroenterology

## 2011-11-18 ENCOUNTER — Telehealth: Payer: Self-pay | Admitting: Gastroenterology

## 2011-11-18 ENCOUNTER — Ambulatory Visit: Payer: Medicare Other | Admitting: Gastroenterology

## 2011-11-18 NOTE — Telephone Encounter (Signed)
No Charge, Pt called and cancelled

## 2011-12-02 ENCOUNTER — Encounter: Payer: Self-pay | Admitting: Gastroenterology

## 2011-12-02 ENCOUNTER — Ambulatory Visit (INDEPENDENT_AMBULATORY_CARE_PROVIDER_SITE_OTHER): Payer: Medicare Other | Admitting: Gastroenterology

## 2011-12-02 DIAGNOSIS — R195 Other fecal abnormalities: Secondary | ICD-10-CM

## 2011-12-02 DIAGNOSIS — E119 Type 2 diabetes mellitus without complications: Secondary | ICD-10-CM

## 2011-12-02 MED ORDER — PEG-KCL-NACL-NASULF-NA ASC-C 100 G PO SOLR: 1.0000 | Freq: Once | ORAL | Status: DC

## 2011-12-02 NOTE — Assessment & Plan Note (Signed)
Heme positive stool could be due to hemorrhoidal disease. A more proximal colonic bleeding source should be ruled out.  Recommendations #1 colonoscopy; if negative I will obtain followup Hemoccults

## 2011-12-02 NOTE — Patient Instructions (Signed)
Colonoscopy A colonoscopy is an exam to evaluate your entire colon. In this exam, your colon is cleansed. A long fiberoptic tube is inserted through your rectum and into your colon. The fiberoptic scope (endoscope) is a long bundle of enclosed and very flexible fibers. These fibers transmit light to the area examined and send images from that area to your caregiver. Discomfort is usually minimal. You may be given a drug to help you sleep (sedative) during or prior to the procedure. This exam helps to detect lumps (tumors), polyps, inflammation, and areas of bleeding. Your caregiver may also take a small piece of tissue (biopsy) that will be examined under a microscope. LET YOUR CAREGIVER KNOW ABOUT:   Allergies to food or medicine.   Medicines taken, including vitamins, herbs, eyedrops, over-the-counter medicines, and creams.   Use of steroids (by mouth or creams).   Previous problems with anesthetics or numbing medicines.   History of bleeding problems or blood clots.   Previous surgery.   Other health problems, including diabetes and kidney problems.   Possibility of pregnancy, if this applies.  BEFORE THE PROCEDURE   A clear liquid diet may be required for 2 days before the exam.   Ask your caregiver about changing or stopping your regular medications.   Liquid injections (enemas) or laxatives may be required.   A large amount of electrolyte solution may be given to you to drink over a short period of time. This solution is used to clean out your colon.   You should be present 60 minutes prior to your procedure or as directed by your caregiver.  AFTER THE PROCEDURE   If you received a sedative or pain relieving medication, you will need to arrange for someone to drive you home.   Occasionally, there is a little blood passed with the first bowel movement. Do not be concerned.  FINDING OUT THE RESULTS OF YOUR TEST Not all test results are available during your visit. If your test  results are not back during the visit, make an appointment with your caregiver to find out the results. Do not assume everything is normal if you have not heard from your caregiver or the medical facility. It is important for you to follow up on all of your test results. HOME CARE INSTRUCTIONS   It is not unusual to pass moderate amounts of gas and experience mild abdominal cramping following the procedure. This is due to air being used to inflate your colon during the exam. Walking or a warm pack on your belly (abdomen) may help.   You may resume all normal meals and activities after sedatives and medicines have worn off.   Only take over-the-counter or prescription medicines for pain, discomfort, or fever as directed by your caregiver. Do not use aspirin or blood thinners if a biopsy was taken. Consult your caregiver for medicine usage if biopsies were taken.  SEEK IMMEDIATE MEDICAL CARE IF:   You have a fever.   You pass large blood clots or fill a toilet with blood following the procedure. This may also occur 10 to 14 days following the procedure. This is more likely if a biopsy was taken.   You develop abdominal pain that keeps getting worse and cannot be relieved with medicine.  Document Released: 10/10/2000 Document Revised: 06/25/2011 Document Reviewed: 05/25/2008 [employer] Patient Information 2012 [employer]. 

## 2011-12-02 NOTE — Progress Notes (Signed)
History of Present Illness:  Margaret Webster is a 51 year old Afro-American female referred after request of Dr.Ariwodo evaluation of Hemoccult-positive stool. This was noted on routine testing. Except for occasional constipation the patient has no GI complaints. With straining she may rarely see blood on the toilet tissue. She is on no gastric irritants including nonsteroidals.    Past Medical History  Diagnosis Date  . Diabetes mellitus   . Chronic back pain   . Hypertension    Past Surgical History  Procedure Date  . Back surgery 1993  . Oophorectomy    family history includes Cancer in her sister; Clotting disorder in her maternal grandfather; Cystic fibrosis in her sister; and Heart disease in her maternal grandmother. Current Outpatient Prescriptions  Medication Sig Dispense Refill  . Cholecalciferol (VITAMIN D-3) 5000 UNITS TABS Take 1 tablet by mouth daily.        Marland Kitchen dextromethorphan (DELSYM) 30 MG/5ML liquid Take 60 mg by mouth 2 (two) times daily as needed. For cough.       . ezetimibe-simvastatin (VYTORIN) 10-20 MG per tablet Take 1 tablet by mouth at bedtime.        Marland Kitchen lisinopril (PRINIVIL,ZESTRIL) 20 MG tablet Take 20 mg by mouth daily.        Marland Kitchen loratadine (CLARITIN) 10 MG tablet Take 10 mg by mouth daily.        . metFORMIN (GLUCOPHAGE) 500 MG tablet Take 500 mg by mouth 2 (two) times daily with a meal.        . senna-docusate (SENOKOT-S) 8.6-50 MG per tablet Take 2 tablets by mouth daily.         Allergies as of 12/02/2011 - Review Complete 12/02/2011  Allergen Reaction Noted  . Lipitor (atorvastatin calcium)  11/04/2011  . Naproxen  11/04/2011    reports that she has been smoking.  She does not have any smokeless tobacco history on file. She reports that she drinks alcohol. She reports that she does not use illicit drugs.     Review of Systems: Pertinent positive and negative review of systems were noted in the above HPI section. All other review of systems were otherwise  negative.  Vital signs were reviewed in today's medical record Physical Exam: General: Well developed , well nourished, no acute distress Head: Normocephalic and atraumatic Eyes:  sclerae anicteric, EOMI Ears: Normal auditory acuity Mouth: No deformity or lesions Neck: Supple, no masses or thyromegaly Lungs: Clear throughout to auscultation Heart: Regular rate and rhythm; no murmurs, rubs or bruits Abdomen: Soft, non tender and non distended. No masses, hepatosplenomegaly or hernias noted. Normal Bowel sounds Rectal:deferred Musculoskeletal: Symmetrical with no gross deformities  Skin: No lesions on visible extremities Pulses:  Normal pulses noted Extremities: No clubbing, cyanosis, edema or deformities noted Neurological: Alert oriented x 4, grossly nonfocal Cervical Nodes:  No significant cervical adenopathy Inguinal Nodes: No significant inguinal adenopathy Psychological:  Alert and cooperative. Normal mood and affect

## 2011-12-08 ENCOUNTER — Ambulatory Visit (AMBULATORY_SURGERY_CENTER): Payer: Medicare Other | Admitting: Gastroenterology

## 2011-12-08 ENCOUNTER — Encounter: Payer: Self-pay | Admitting: Gastroenterology

## 2011-12-08 VITALS — BP 133/98 | HR 72 | Temp 97.6°F | Resp 21 | Ht 67.0 in | Wt 226.0 lb

## 2011-12-08 DIAGNOSIS — D126 Benign neoplasm of colon, unspecified: Secondary | ICD-10-CM

## 2011-12-08 DIAGNOSIS — R195 Other fecal abnormalities: Secondary | ICD-10-CM

## 2011-12-08 DIAGNOSIS — K621 Rectal polyp: Secondary | ICD-10-CM

## 2011-12-08 DIAGNOSIS — K620 Anal polyp: Secondary | ICD-10-CM

## 2011-12-08 LAB — GLUCOSE, CAPILLARY
Glucose-Capillary: 81 mg/dL (ref 70–99)
Glucose-Capillary: 129 mg/dL — ABNORMAL HIGH (ref 70–99)
Glucose-Capillary: 106 mg/dL — ABNORMAL HIGH (ref 70–99)

## 2011-12-08 MED ORDER — SODIUM CHLORIDE 0.9 % IV SOLN: 500.0000 mL | INTRAVENOUS | Status: DC

## 2011-12-08 MED ORDER — DEXTROSE 5 % IV SOLN: INTRAVENOUS | Status: DC

## 2011-12-08 NOTE — Progress Notes (Signed)
1400 cbg 129; ivf's changed from d5w to NS

## 2011-12-08 NOTE — Patient Instructions (Signed)
Discharge instructions given with verbal understanding. Handouts on polyps given. hemeoccults cards sent home to start after five days. Resume previous medications.

## 2011-12-08 NOTE — Progress Notes (Signed)
Patient did not experience any of the following events: a burn prior to discharge; a fall within the facility; wrong site/side/patient/procedure/implant event; or a hospital transfer or hospital admission upon discharge from the facility. (G8907) Patient did not have preoperative order for IV antibiotic SSI prophylaxis. (G8918)  

## 2011-12-08 NOTE — Progress Notes (Signed)
Propofol per s camp crna. See scanned intra procedure report. ewm 

## 2011-12-08 NOTE — Op Note (Signed)
[facility] [address] [place], [place]  [address]  COLONOSCOPY PROCEDURE REPORT  PATIENT:  Webster, Margaret  MR#:  [id number] BIRTHDATE:  [birth year], 50 yrs. old  GENDER:  female ENDOSCOPIST:  Margaret Hair. Arlyce Dice, MD REF. BY: PROCEDURE DATE:  12/08/2011 PROCEDURE:  Colon with cold biopsy polypectomy ASA CLASS:  Class II INDICATIONS:  heme positive stool MEDICATIONS:   MAC sedation, administered by CRNA propofol 280mg IV  DESCRIPTION OF PROCEDURE:   After the risks benefits and alternatives of the procedure were thoroughly explained, informed consent was obtained.  Digital rectal exam was performed and revealed no abnormalities.   The [id number] endoscope was introduced through the anus and advanced to the cecum, which was identified by both the appendix and ileocecal valve, without limitations.  The quality of the prep was excellent, using MoviPrep.  The instrument was then slowly withdrawn as the colon was fully examined. <<PROCEDUREIMAGES>>  FINDINGS:  A sessile polyp was found in the rectum. 2mm nonbleeding polyp in rectal vault The polyp was removed using cold biopsy forceps (see image5).  This was otherwise a normal examination of the colon (see image2, image3, and image4). Retroflexed views in the rectum revealed no abnormalities.    The time to cecum =  1) 2.50  minutes. The scope was then withdrawn in 1) 7.0  minutes from the cecum and the procedure completed. COMPLICATIONS:  None ENDOSCOPIC IMPRESSION: 1) Sessile polyp in the rectum 2) Otherwise normal examination RECOMMENDATIONS: 1) followup hemeoccults in 4-5 days 2) If the polyp(s) removed today are proven to be adenomatous (pre-cancerous) polyps, you will need a repeat colonoscopy in 5 years. Otherwise you should continue to follow colorectal cancer screening guidelines for "routine risk" patients with colonoscopy in 10 years. REPEAT EXAM:  You will receive a letter from Dr. Arlyce Dice in 1-2 weeks,  after reviewing the final pathology, with followup recommendations.  ______________________________ Margaret Hair Arlyce Dice, MD  CC:  Nicki Reaper MD  n. Rosalie DoctorBarbette Hair. Dequane Webster at 12/08/2011 02:42 PM  Leonie Douglas, [phone]

## 2011-12-09 ENCOUNTER — Telehealth: Payer: Self-pay | Admitting: *Deleted

## 2011-12-09 NOTE — Telephone Encounter (Signed)
  Follow up Call-  Call back number 12/08/2011  Post procedure Call Back phone  # [phone]  Permission to leave phone message Yes     Patient questions:  Do you have a fever, pain , or abdominal swelling? no Pain Score  0 *  Have you tolerated food without any problems? no  Patient states trouble swallowing; suggested EGD  Have you been able to return to your normal activities? yes  Do you have any questions about your discharge instructions: Diet   no Medications  no Follow up visit  no  Do you have questions or concerns about your Care? no  Actions: * If pain score is 4 or above: No action needed, pain <4.

## 2011-12-16 ENCOUNTER — Encounter: Payer: Self-pay | Admitting: Gastroenterology

## 2011-12-26 ENCOUNTER — Other Ambulatory Visit: Payer: Self-pay | Admitting: Gastroenterology

## 2011-12-26 DIAGNOSIS — D649 Anemia, unspecified: Secondary | ICD-10-CM

## 2012-06-08 ENCOUNTER — Other Ambulatory Visit (HOSPITAL_COMMUNITY): Payer: Self-pay | Admitting: Internal Medicine

## 2012-06-08 DIAGNOSIS — Z1231 Encounter for screening mammogram for malignant neoplasm of breast: Secondary | ICD-10-CM

## 2012-06-29 ENCOUNTER — Ambulatory Visit (HOSPITAL_COMMUNITY): Payer: Medicare Other | Attending: Internal Medicine

## 2012-07-08 ENCOUNTER — Ambulatory Visit (HOSPITAL_COMMUNITY)
Admission: RE | Admit: 2012-07-08 | Discharge: 2012-07-08 | Disposition: A | Discharge status: Home / Self Care | Payer: Medicare Other | Source: Ambulatory Visit | Attending: Internal Medicine | Admitting: Internal Medicine

## 2012-07-08 DIAGNOSIS — Z1231 Encounter for screening mammogram for malignant neoplasm of breast: Secondary | ICD-10-CM | POA: Insufficient documentation

## 2012-08-04 ENCOUNTER — Encounter (HOSPITAL_COMMUNITY): Payer: Self-pay | Admitting: Emergency Medicine

## 2012-08-04 ENCOUNTER — Emergency Department (HOSPITAL_COMMUNITY)
Admission: EM | Admit: 2012-08-04 | Discharge: 2012-08-04 | Disposition: A | Discharge status: Home / Self Care | Payer: Medicare Other | Attending: Emergency Medicine | Admitting: Emergency Medicine

## 2012-08-04 ENCOUNTER — Emergency Department (HOSPITAL_COMMUNITY): Payer: Medicare Other

## 2012-08-04 DIAGNOSIS — R10819 Abdominal tenderness, unspecified site: Secondary | ICD-10-CM | POA: Insufficient documentation

## 2012-08-04 DIAGNOSIS — R112 Nausea with vomiting, unspecified: Secondary | ICD-10-CM | POA: Insufficient documentation

## 2012-08-04 DIAGNOSIS — Z79899 Other long term (current) drug therapy: Secondary | ICD-10-CM | POA: Insufficient documentation

## 2012-08-04 DIAGNOSIS — M549 Dorsalgia, unspecified: Secondary | ICD-10-CM

## 2012-08-04 DIAGNOSIS — R1012 Left upper quadrant pain: Secondary | ICD-10-CM | POA: Insufficient documentation

## 2012-08-04 DIAGNOSIS — F17200 Nicotine dependence, unspecified, uncomplicated: Secondary | ICD-10-CM | POA: Insufficient documentation

## 2012-08-04 DIAGNOSIS — I10 Essential (primary) hypertension: Secondary | ICD-10-CM | POA: Insufficient documentation

## 2012-08-04 DIAGNOSIS — E119 Type 2 diabetes mellitus without complications: Secondary | ICD-10-CM | POA: Insufficient documentation

## 2012-08-04 LAB — COMPREHENSIVE METABOLIC PANEL
ALT: 23 U/L (ref 0–35)
AST: 22 U/L (ref 0–37)
Albumin: 3.7 g/dL (ref 3.5–5.2)
Alkaline Phosphatase: 63 U/L (ref 39–117)
BUN: 10 mg/dL (ref 6–23)
CO2: 26 mEq/L (ref 19–32)
Calcium: 10 mg/dL (ref 8.4–10.5)
Chloride: 106 mEq/L (ref 96–112)
Creatinine, Ser: 0.81 mg/dL (ref 0.50–1.10)
GFR calc Af Amer: >90 mL/min (ref >90)
GFR calc non Af Amer: 81 mL/min — ABNORMAL LOW (ref >90)
Glucose, Bld: 95 mg/dL (ref 70–99)
Potassium: 3.9 mEq/L (ref 3.5–5.1)
Sodium: 141 mEq/L (ref 135–145)
Total Bilirubin: 0.3 mg/dL (ref 0.3–1.2)
Total Protein: 7.2 g/dL (ref 6.0–8.3)

## 2012-08-04 LAB — URINALYSIS, ROUTINE W REFLEX MICROSCOPIC
Bilirubin Urine: NEGATIVE
Glucose, UA: NEGATIVE mg/dL
Hgb urine dipstick: NEGATIVE
Ketones, ur: NEGATIVE mg/dL
Leukocytes, UA: NEGATIVE
Nitrite: NEGATIVE
Protein, ur: 30 mg/dL — AB
Specific Gravity, Urine: 1.006 (ref 1.005–1.030)
Urobilinogen, UA: 0.2 mg/dL (ref 0.0–1.0)
pH: 7.5 (ref 5.0–8.0)

## 2012-08-04 LAB — CBC WITH DIFFERENTIAL/PLATELET
Basophils Absolute: 0 10*3/uL (ref 0.0–0.1)
Basophils Relative: 0 % (ref 0–1)
Eosinophils Absolute: 0.1 10*3/uL (ref 0.0–0.7)
Eosinophils Relative: 1 % (ref 0–5)
HCT: 37.9 % (ref 36.0–46.0)
Hemoglobin: 13.3 g/dL (ref 12.0–15.0)
Lymphocytes Relative: 39 % (ref 12–46)
Lymphs Abs: 2.1 10*3/uL (ref 0.7–4.0)
MCH: 31.5 pg (ref 26.0–34.0)
MCHC: 35.1 g/dL (ref 30.0–36.0)
MCV: 89.8 fL (ref 78.0–100.0)
Monocytes Absolute: 0.5 10*3/uL (ref 0.1–1.0)
Monocytes Relative: 9 % (ref 3–12)
Neutro Abs: 2.7 10*3/uL (ref 1.7–7.7)
Neutrophils Relative %: 51 % (ref 43–77)
Platelets: 226 10*3/uL (ref 150–400)
RBC: 4.22 MIL/uL (ref 3.87–5.11)
RDW: 13 % (ref 11.5–15.5)
WBC: 5.3 10*3/uL (ref 4.0–10.5)

## 2012-08-04 LAB — GLUCOSE, CAPILLARY
Glucose-Capillary: 69 mg/dL — ABNORMAL LOW (ref 70–99)
Glucose-Capillary: 99 mg/dL (ref 70–99)

## 2012-08-04 LAB — LIPASE, BLOOD: Lipase: 20 U/L (ref 11–59)

## 2012-08-04 LAB — URINE MICROSCOPIC-ADD ON

## 2012-08-04 LAB — PREGNANCY, URINE: Preg Test, Ur: NEGATIVE

## 2012-08-04 MED ORDER — OXYCODONE-ACETAMINOPHEN 5-325 MG PO TABS: 1.0000 | ORAL_TABLET | ORAL | Status: DC | PRN

## 2012-08-04 MED ORDER — MORPHINE SULFATE 4 MG/ML IJ SOLN: 4.0000 mg | Freq: Once | INTRAMUSCULAR | Status: DC

## 2012-08-04 MED ORDER — OXYCODONE-ACETAMINOPHEN 5-325 MG PO TABS
2.0000 | ORAL_TABLET | Freq: Once | ORAL | Status: AC
  Administered 2012-08-04: 2 via ORAL
  Filled 2012-08-04: qty 2

## 2012-08-04 MED ORDER — SODIUM CHLORIDE 0.9 % IV BOLUS (SEPSIS)
1000.0000 mL | Freq: Once | INTRAVENOUS | Status: AC
  Administered 2012-08-04: 1000 mL via INTRAVENOUS

## 2012-08-04 MED ORDER — MORPHINE SULFATE 4 MG/ML IJ SOLN
4.0000 mg | Freq: Once | INTRAMUSCULAR | Status: AC
  Administered 2012-08-04: 4 mg via INTRAVENOUS
  Filled 2012-08-04: qty 1

## 2012-08-04 NOTE — ED Provider Notes (Signed)
History     CSN: [id number]  Arrival date & time 08/04/12  1018   First MD Initiated Contact with Patient 08/04/12 1104      Chief Complaint  Patient presents with  . Flank Pain    (Consider location/radiation/quality/duration/timing/severity/associated sxs/prior treatment) HPI Comments: This is a 51 year old, female who presents to the ED with a chief complaint of RUQ pain.  She states that she has been having pain since last Friday.  She states that she has not had this type of pain before.  She states that if she lies down on her right side it will cause her to vomit.  Nothing makes the pain better.  The pain is intermittent in nature.  She is in 8/10 pain.  The history is provided by the patient. No language interpreter was used.    Past Medical History  Diagnosis Date  . Diabetes mellitus   . Chronic back pain   . Hypertension     Past Surgical History  Procedure Date  . Back surgery 1993  . Oophorectomy     Family History  Problem Relation Age of Onset  . Cystic fibrosis Sister   . Cancer Sister     ?  Marland Kitchen Heart disease Maternal Grandmother   . Clotting disorder Maternal Grandfather     History  Substance Use Topics  . Smoking status: Current Every Day Smoker  . Smokeless tobacco: Not on file  . Alcohol Use: Yes     rarely    OB History    Grav Para Term Preterm Abortions TAB SAB Ect Mult Living                  Review of Systems  Constitutional: Negative for fever.  Eyes: Negative for visual disturbance.  Respiratory: Negative for cough and shortness of breath.   Cardiovascular: Negative for chest pain.  Gastrointestinal: Positive for nausea, vomiting and abdominal pain. Negative for diarrhea and constipation.  Genitourinary: Negative for dysuria, vaginal bleeding and pelvic pain.  Musculoskeletal: Positive for back pain.  Skin: Negative for rash and wound.  Neurological: Negative for weakness.  Psychiatric/Behavioral: Negative for decreased  concentration.  All other systems reviewed and are negative.    Allergies  Lipitor; Naproxen; and Other  Home Medications   Current Outpatient Rx  Name Route Sig Dispense Refill  . VITAMIN D-3 5000 UNITS PO TABS Oral Take 1 tablet by mouth daily.      Marland Kitchen EZETIMIBE-SIMVASTATIN 10-20 MG PO TABS Oral Take 1 tablet by mouth at bedtime.      Marland Kitchen LISINOPRIL 20 MG PO TABS Oral Take 20 mg by mouth daily.      Marland Kitchen LORATADINE 10 MG PO TABS Oral Take 10 mg by mouth daily.      Marland Kitchen METFORMIN HCL 500 MG PO TABS Oral Take 500 mg by mouth 2 (two) times daily with a meal.      . SENNOSIDES-DOCUSATE SODIUM 8.6-50 MG PO TABS Oral Take 1 tablet by mouth daily as needed. For constipation      BP 145/63  Pulse 63  Temp 98.2 F (36.8 C)  Resp 16  SpO2 99%  Physical Exam  Nursing note and vitals reviewed. Constitutional: She is oriented to person, place, and time. She appears well-developed and well-nourished.  HENT:  Head: Normocephalic and atraumatic.  Eyes: Conjunctivae normal and EOM are normal. Pupils are equal, round, and reactive to light.  Neck: Normal range of motion. Neck supple.  Cardiovascular: Normal  rate, regular rhythm and normal heart sounds.   Pulmonary/Chest: Effort normal and breath sounds normal.  Abdominal: Soft. She exhibits no distension and no mass. There is tenderness. There is no rebound and no guarding.       RUQ and RLQ pain with palpation. CVA tenderness.    Genitourinary: No vaginal discharge found.  Musculoskeletal: Normal range of motion.  Neurological: She is alert and oriented to person, place, and time.  Skin: Skin is warm and dry.  Psychiatric: She has a normal mood and affect. Her behavior is normal. Judgment and thought content normal.    ED Course  Procedures (including critical care time)   Labs Reviewed  CBC WITH DIFFERENTIAL  URINALYSIS, ROUTINE W REFLEX MICROSCOPIC  COMPREHENSIVE METABOLIC PANEL  LIPASE, BLOOD  PREGNANCY, URINE   Results for orders  placed during the hospital encounter of 08/04/12  URINALYSIS, ROUTINE W REFLEX MICROSCOPIC      Component Value Range   Color, Urine YELLOW  YELLOW   APPearance CLEAR  CLEAR   Specific Gravity, Urine 1.006  1.005 - 1.030   pH 7.5  5.0 - 8.0   Glucose, UA NEGATIVE  NEGATIVE mg/dL   Hgb urine dipstick NEGATIVE  NEGATIVE   Bilirubin Urine NEGATIVE  NEGATIVE   Ketones, ur NEGATIVE  NEGATIVE mg/dL   Protein, ur 30 (*) NEGATIVE mg/dL   Urobilinogen, UA 0.2  0.0 - 1.0 mg/dL   Nitrite NEGATIVE  NEGATIVE   Leukocytes, UA NEGATIVE  NEGATIVE  CBC WITH DIFFERENTIAL      Component Value Range   WBC 5.3  4.0 - 10.5 K/uL   RBC 4.22  3.87 - 5.11 MIL/uL   Hemoglobin 13.3  12.0 - 15.0 g/dL   HCT 16.1  09.6 - 04.5 %   MCV 89.8  78.0 - 100.0 fL   MCH 31.5  26.0 - 34.0 pg   MCHC 35.1  30.0 - 36.0 g/dL   RDW 40.9  81.1 - 91.4 %   Platelets 226  150 - 400 K/uL   Neutrophils Relative 51  43 - 77 %   Neutro Abs 2.7  1.7 - 7.7 K/uL   Lymphocytes Relative 39  12 - 46 %   Lymphs Abs 2.1  0.7 - 4.0 K/uL   Monocytes Relative 9  3 - 12 %   Monocytes Absolute 0.5  0.1 - 1.0 K/uL   Eosinophils Relative 1  0 - 5 %   Eosinophils Absolute 0.1  0.0 - 0.7 K/uL   Basophils Relative 0  0 - 1 %   Basophils Absolute 0.0  0.0 - 0.1 K/uL  COMPREHENSIVE METABOLIC PANEL      Component Value Range   Sodium 141  135 - 145 mEq/L   Potassium 3.9  3.5 - 5.1 mEq/L   Chloride 106  96 - 112 mEq/L   CO2 26  19 - 32 mEq/L   Glucose, Bld 95  70 - 99 mg/dL   BUN 10  6 - 23 mg/dL   Creatinine, Ser 7.82  0.50 - 1.10 mg/dL   Calcium 95.6  8.4 - 21.3 mg/dL   Total Protein 7.2  6.0 - 8.3 g/dL   Albumin 3.7  3.5 - 5.2 g/dL   AST 22  0 - 37 U/L   ALT 23  0 - 35 U/L   Alkaline Phosphatase 63  39 - 117 U/L   Total Bilirubin 0.3  0.3 - 1.2 mg/dL   GFR calc non Af Amer 81 (*) >  90 mL/min   GFR calc Af Amer >90  >90 mL/min  LIPASE, BLOOD      Component Value Range   Lipase 20  11 - 59 U/L  PREGNANCY, URINE      Component Value  Range   Preg Test, Ur NEGATIVE  NEGATIVE  URINE MICROSCOPIC-ADD ON      Component Value Range   Squamous Epithelial / LPF RARE  RARE   Bacteria, UA RARE  RARE   Urine-Other RARE YEAST    GLUCOSE, CAPILLARY      Component Value Range   Glucose-Capillary 69 (*) 70 - 99 mg/dL   Comment 1 Documented in Chart     US Abdomen Complete  08/04/2012  *RADIOLOGY REPORT*  Clinical Data:  Right upper quadrant pain  COMPLETE ABDOMINAL ULTRASOUND  Comparison:  None.  Findings:  Gallbladder:  Normal without stones, sludge or wall thickening.  Common bile duct:  Normal at 4 mm.  Liver:  Normal echogenicity.  No focal lesions or ductal dilatation.  IVC:  Normal.  Pancreas:  Normal.  Spleen:  Normal.  6.5 cm in length.  Right Kidney:  13 cm in length.  Normal echogenicity.  No cyst, mass, stone or hydronephrosis.  Left Kidney:  11.7 cm in length.  Similarly normal.  Abdominal aorta:  Normal.  No ascites  IMPRESSION: Normal abdominal ultrasound.  No cause of right upper quadrant pain identified.   Original Report Authenticated By: Thomasenia Sales, M.D.       1. Back pain       MDM  51 year old female, who presents with RUQ pain.  Concerned about gallbladder disease vs kidney stone.  Patient has had no history of kidney stones.  Murphy's sign is positive, will perform abdominal US as suspicion is higher for gallbladder disease. Abdominal US results detailed above.  This patient has been discussed with Dr. Effie Shy.  2:38 PM Dr. Effie Shy evaluated the patient.  We believe the pain to be musculoskeletal in nature.  Nurse tells me that the patient's blood sugar is 69.  Told nurse to give orange juice and percocet.  3:19 PM  Dr. Effie Shy tells me that this patient could benefit from being seen at the [facility].  As there are no emergent findings throughout the workup, I am going to discharge the patient to home.  Patient is encouraged to use ice and heat for the pain.  I will prescribe pain medicine and have  her follow-up with her PCP for PT or the pain clinic.  Patient is agreeable with this plan.  The patient is stable and ready for discharge.        Roxy Horseman, PA-C 08/04/12 1524

## 2012-08-04 NOTE — ED Provider Notes (Signed)
Medical screening examination/treatment/procedure(s) were conducted as a shared visit with non-physician practitioner(s) and myself.  I personally evaluated the patient during the encounter   Patient with right flank pain, reproducible on examination. Ongoing, without trauma. No significant nausea, vomiting, weakness, or dizziness. ED evaluation is negative for acute infectious processes, or evidence of gallbladder, liver, or kidney disorders. She is improved, with ED treatment, and stable for discharge.  Flint Melter, MD 08/04/12 2008

## 2012-08-04 NOTE — ED Notes (Signed)
MD at bedside. 

## 2012-08-04 NOTE — ED Provider Notes (Signed)
Medical screening examination/treatment/procedure(s) were performed by non-physician practitioner and as supervising physician I was immediately available for consultation/collaboration.   EDP Exam- Tender right chest and flank. NO ABDOMINAL TENDERNESS.  Plan: f/u with PCP. No recommended f/u with a pain clinic.  Flint Melter, MD 08/04/12 2011

## 2012-08-04 NOTE — ED Notes (Signed)
Back pain and rt side vomits she states got flu shot on Friday has been sick since

## 2012-11-04 ENCOUNTER — Emergency Department (HOSPITAL_COMMUNITY)
Admission: EM | Admit: 2012-11-04 | Discharge: 2012-11-04 | Disposition: A | Discharge status: Home / Self Care | Payer: Medicare Other | Attending: Emergency Medicine | Admitting: Emergency Medicine

## 2012-11-04 ENCOUNTER — Encounter (HOSPITAL_COMMUNITY): Payer: Self-pay | Admitting: Emergency Medicine

## 2012-11-04 ENCOUNTER — Emergency Department (HOSPITAL_COMMUNITY): Payer: Medicare Other

## 2012-11-04 DIAGNOSIS — H6690 Otitis media, unspecified, unspecified ear: Secondary | ICD-10-CM | POA: Insufficient documentation

## 2012-11-04 DIAGNOSIS — Z79899 Other long term (current) drug therapy: Secondary | ICD-10-CM | POA: Insufficient documentation

## 2012-11-04 DIAGNOSIS — I10 Essential (primary) hypertension: Secondary | ICD-10-CM | POA: Insufficient documentation

## 2012-11-04 DIAGNOSIS — G8929 Other chronic pain: Secondary | ICD-10-CM | POA: Insufficient documentation

## 2012-11-04 DIAGNOSIS — F17200 Nicotine dependence, unspecified, uncomplicated: Secondary | ICD-10-CM | POA: Insufficient documentation

## 2012-11-04 DIAGNOSIS — E119 Type 2 diabetes mellitus without complications: Secondary | ICD-10-CM | POA: Insufficient documentation

## 2012-11-04 MED ORDER — DIPHENHYDRAMINE HCL 25 MG PO CAPS: 25.0000 mg | ORAL_CAPSULE | Freq: Four times a day (QID) | ORAL | Status: DC | PRN

## 2012-11-04 MED ORDER — AMOXICILLIN-POT CLAVULANATE 875-125 MG PO TABS: 1.0000 | ORAL_TABLET | Freq: Two times a day (BID) | ORAL | Status: DC

## 2012-11-04 MED ORDER — DEXTROMETHORPHAN HBR 15 MG/5ML PO SYRP: 10.0000 mL | ORAL_SOLUTION | Freq: Four times a day (QID) | ORAL | Status: DC | PRN

## 2012-11-04 NOTE — ED Notes (Signed)
States productive cough green sputum with bilateral ear pain right worse to right then left. Pain 6/10 dull intermittent sharp. No hearing deficits. Airway intact bilateral equal chest rise and fall.

## 2012-11-04 NOTE — ED Provider Notes (Signed)
History     CSN: [id number]  Arrival date & time 11/04/12  0865   First MD Initiated Contact with Patient 11/04/12 0957      Chief Complaint  Patient presents with  . Cough  . Otalgia    (Consider location/radiation/quality/duration/timing/severity/associated sxs/prior treatment) HPI Comments: Patient is a 52 year old female who presents with a 2 day history of right ear pain. Symptoms started gradually and progressively worsened since the onset. The pain is sharp and severe and radiates down her right neck. Pain is constant. No aggravating/alleviating factors. Associated symptoms include productive cough. Patient has not tried anything for symptoms.   Patient is a 52 y.o. female presenting with cough and ear pain.  Cough Associated symptoms include ear pain.  Otalgia Associated symptoms include cough.    Past Medical History  Diagnosis Date  . Diabetes mellitus   . Chronic back pain   . Hypertension     Past Surgical History  Procedure Date  . Back surgery 1993  . Oophorectomy     Family History  Problem Relation Age of Onset  . Cystic fibrosis Sister   . Cancer Sister     ?  Marland Kitchen Heart disease Maternal Grandmother   . Clotting disorder Maternal Grandfather     History  Substance Use Topics  . Smoking status: Current Every Day Smoker  . Smokeless tobacco: Not on file  . Alcohol Use: Yes     Comment: rarely    OB History    Grav Para Term Preterm Abortions TAB SAB Ect Mult Living                  Review of Systems  HENT: Positive for ear pain.   Respiratory: Positive for cough.   All other systems reviewed and are negative.    Allergies  Lipitor; Naproxen; and Other  Home Medications   Current Outpatient Rx  Name  Route  Sig  Dispense  Refill  . ALBUTEROL SULFATE HFA 108 (90 BASE) MCG/ACT IN AERS   Inhalation   Inhale 2 puffs into the lungs every 6 (six) hours as needed. For shortness of breath         . ALPRAZOLAM 0.5 MG PO TABS   Oral  Take 0.5 mg by mouth 3 (three) times daily as needed. For anxiety         . VITAMIN D-3 5000 UNITS PO TABS   Oral   Take 1 tablet by mouth daily.           Marland Kitchen ESOMEPRAZOLE MAGNESIUM 40 MG PO CPDR   Oral   Take 40 mg by mouth daily before breakfast.         . EZETIMIBE-SIMVASTATIN 10-20 MG PO TABS   Oral   Take 1 tablet by mouth at bedtime.           Marland Kitchen HYDROCODONE-ACETAMINOPHEN 7.5-750 MG PO TABS   Oral   Take 1 tablet by mouth every 6 (six) hours as needed. For pain         . LISINOPRIL 20 MG PO TABS   Oral   Take 20 mg by mouth daily.           Marland Kitchen LORATADINE 10 MG PO TABS   Oral   Take 10 mg by mouth daily.           Marland Kitchen METFORMIN HCL 500 MG PO TABS   Oral   Take 500 mg by mouth 2 (two) times daily with  a meal.           . SENNOSIDES-DOCUSATE SODIUM 8.6-50 MG PO TABS   Oral   Take 1 tablet by mouth daily as needed. For constipation           BP 108/75  Pulse 84  Temp 99.1 F (37.3 C) (Oral)  Resp 16  SpO2 99%  Physical Exam  Nursing note and vitals reviewed. Constitutional: She is oriented to person, place, and time. She appears well-developed and well-nourished. No distress.  HENT:  Head: Normocephalic and atraumatic.  Mouth/Throat: Oropharynx is clear and moist. No oropharyngeal exudate.       Left ear painful with retraction of auricle and palpation of mastoid process. Left external ear canal erythematous. Right external ear canal clear. TM intact bilaterally.   Eyes: Conjunctivae normal and EOM are normal. Pupils are equal, round, and reactive to light. No scleral icterus.  Neck: Normal range of motion. Neck supple.  Cardiovascular: Normal rate and regular rhythm.  Exam reveals no gallop and no friction rub.   No murmur heard. Pulmonary/Chest: Effort normal and breath sounds normal. She has no wheezes. She has no rales. She exhibits no tenderness.  Abdominal: Soft. She exhibits no distension. There is no tenderness. There is no rebound and no  guarding.  Musculoskeletal: Normal range of motion.  Neurological: She is alert and oriented to person, place, and time.       Speech is goal-oriented. Moves limbs without ataxia.   Skin: Skin is warm and dry.  Psychiatric: She has a normal mood and affect. Her behavior is normal.    ED Course  Procedures (including critical care time)  Labs Reviewed - No data to display Dg Chest 2 View  11/04/2012  *RADIOLOGY REPORT*  Clinical Data: Cough.  Pain in the neck.  CHEST - 2 VIEW  Comparison: Chest x-ray 11/04/2011.  Findings: Lung volumes are normal.  No consolidative airspace disease.  No pleural effusions.  No pneumothorax.  No pulmonary nodule or mass noted.  Pulmonary vasculature and the cardiomediastinal silhouette are within normal limits.  IMPRESSION: 1. No radiographic evidence of acute cardiopulmonary disease.   Original Report Authenticated By: Trudie Reed, M.D.      1. Otitis media       MDM  10:23 AM Chest xray unremarkable. Patient likely has otitis media and otitis externa. Patient will have Augmentin. Patient will also have benadryl for congestion and dextromethorphan for cough. Patient afebrile. Vitals stable for discharge. No further evaluation needed at this time.         Emilia Beck, PA-C 11/08/12 1438

## 2012-11-08 NOTE — ED Provider Notes (Signed)
Medical screening examination/treatment/procedure(s) were performed by non-physician practitioner and as supervising physician I was immediately available for consultation/collaboration.   Richardean Canal, MD 11/08/12 [phone]

## 2012-11-27 HISTORY — PX: CERVICAL SPINE SURGERY: SHX589

## 2012-12-06 DIAGNOSIS — M4712 Other spondylosis with myelopathy, cervical region: Secondary | ICD-10-CM | POA: Insufficient documentation

## 2012-12-06 DIAGNOSIS — G5603 Carpal tunnel syndrome, bilateral upper limbs: Secondary | ICD-10-CM | POA: Insufficient documentation

## 2012-12-13 DIAGNOSIS — M5412 Radiculopathy, cervical region: Secondary | ICD-10-CM | POA: Insufficient documentation

## 2013-01-17 DIAGNOSIS — Z981 Arthrodesis status: Secondary | ICD-10-CM | POA: Insufficient documentation

## 2013-02-21 ENCOUNTER — Encounter (HOSPITAL_COMMUNITY): Payer: Self-pay

## 2013-02-21 ENCOUNTER — Emergency Department (HOSPITAL_COMMUNITY): Payer: Medicare Other

## 2013-02-21 ENCOUNTER — Emergency Department (HOSPITAL_COMMUNITY)
Admission: EM | Admit: 2013-02-21 | Discharge: 2013-02-21 | Disposition: A | Discharge status: Home / Self Care | Payer: Medicare Other | Attending: Emergency Medicine | Admitting: Emergency Medicine

## 2013-02-21 DIAGNOSIS — IMO0002 Reserved for concepts with insufficient information to code with codable children: Secondary | ICD-10-CM | POA: Insufficient documentation

## 2013-02-21 DIAGNOSIS — I10 Essential (primary) hypertension: Secondary | ICD-10-CM | POA: Insufficient documentation

## 2013-02-21 DIAGNOSIS — M25519 Pain in unspecified shoulder: Secondary | ICD-10-CM | POA: Insufficient documentation

## 2013-02-21 DIAGNOSIS — X58XXXA Exposure to other specified factors, initial encounter: Secondary | ICD-10-CM | POA: Insufficient documentation

## 2013-02-21 DIAGNOSIS — S29011A Strain of muscle and tendon of front wall of thorax, initial encounter: Secondary | ICD-10-CM

## 2013-02-21 DIAGNOSIS — E119 Type 2 diabetes mellitus without complications: Secondary | ICD-10-CM | POA: Insufficient documentation

## 2013-02-21 DIAGNOSIS — Y939 Activity, unspecified: Secondary | ICD-10-CM | POA: Insufficient documentation

## 2013-02-21 DIAGNOSIS — Z8739 Personal history of other diseases of the musculoskeletal system and connective tissue: Secondary | ICD-10-CM | POA: Insufficient documentation

## 2013-02-21 DIAGNOSIS — Y929 Unspecified place or not applicable: Secondary | ICD-10-CM | POA: Insufficient documentation

## 2013-02-21 DIAGNOSIS — IMO0001 Reserved for inherently not codable concepts without codable children: Secondary | ICD-10-CM | POA: Insufficient documentation

## 2013-02-21 DIAGNOSIS — F17200 Nicotine dependence, unspecified, uncomplicated: Secondary | ICD-10-CM | POA: Insufficient documentation

## 2013-02-21 DIAGNOSIS — Z79899 Other long term (current) drug therapy: Secondary | ICD-10-CM | POA: Insufficient documentation

## 2013-02-21 LAB — BASIC METABOLIC PANEL
BUN: 22 mg/dL (ref 6–23)
CO2: 30 mEq/L (ref 19–32)
Calcium: 10 mg/dL (ref 8.4–10.5)
Chloride: 104 mEq/L (ref 96–112)
Creatinine, Ser: 1.21 mg/dL — ABNORMAL HIGH (ref 0.50–1.10)
GFR calc Af Amer: 58 mL/min — ABNORMAL LOW (ref >90)
GFR calc non Af Amer: 50 mL/min — ABNORMAL LOW (ref >90)
Glucose, Bld: 106 mg/dL — ABNORMAL HIGH (ref 70–99)
Potassium: 4.7 mEq/L (ref 3.5–5.1)
Sodium: 140 mEq/L (ref 135–145)

## 2013-02-21 LAB — POCT I-STAT TROPONIN I: Troponin i, poc: 0 ng/mL (ref 0.00–0.08)

## 2013-02-21 LAB — CBC
HCT: 34.7 % — ABNORMAL LOW (ref 36.0–46.0)
Hemoglobin: 12.1 g/dL (ref 12.0–15.0)
MCH: 30.6 pg (ref 26.0–34.0)
MCHC: 34.9 g/dL (ref 30.0–36.0)
MCV: 87.8 fL (ref 78.0–100.0)
Platelets: 238 10*3/uL (ref 150–400)
RBC: 3.95 MIL/uL (ref 3.87–5.11)
RDW: 13.4 % (ref 11.5–15.5)
WBC: 6.2 10*3/uL (ref 4.0–10.5)

## 2013-02-21 MED ORDER — METHOCARBAMOL 500 MG PO TABS: 500.0000 mg | ORAL_TABLET | Freq: Two times a day (BID) | ORAL | Status: DC

## 2013-02-21 MED ORDER — METHOCARBAMOL 500 MG PO TABS
500.0000 mg | ORAL_TABLET | Freq: Once | ORAL | Status: AC
  Administered 2013-02-21: 500 mg via ORAL
  Filled 2013-02-21: qty 1

## 2013-02-21 MED ORDER — HYDROCODONE-ACETAMINOPHEN 5-325 MG PO TABS: 1.0000 | ORAL_TABLET | ORAL | Status: DC | PRN

## 2013-02-21 MED ORDER — HYDROCODONE-ACETAMINOPHEN 5-325 MG PO TABS
1.0000 | ORAL_TABLET | Freq: Once | ORAL | Status: AC
  Administered 2013-02-21: 1 via ORAL
  Filled 2013-02-21: qty 1

## 2013-02-21 NOTE — ED Notes (Signed)
Patient transported to X-ray 

## 2013-02-21 NOTE — ED Provider Notes (Signed)
History     CSN: [id number]  Arrival date & time 02/21/13  4098   First MD Initiated Contact with Patient 02/21/13 [phone]      Chief Complaint  Patient presents with  . Chest Pain  . Shoulder Pain    (Consider location/radiation/quality/duration/timing/severity/associated sxs/prior treatment) HPI Pt woke 2 days ago with R chest and shoulder pain. Worse with movement and palpation. No known trauma. No SOB, wheezing or cough. No fever or chills. No lower extremity swelling.  Past Medical History  Diagnosis Date  . Diabetes mellitus   . Chronic back pain   . Hypertension     Past Surgical History  Procedure Laterality Date  . Back surgery  1993  . Oophorectomy    . Cervical spine surgery Right 11/2012    Family History  Problem Relation Age of Onset  . Cystic fibrosis Sister   . Cancer Sister     ?  Marland Kitchen Heart disease Maternal Grandmother   . Clotting disorder Maternal Grandfather     History  Substance Use Topics  . Smoking status: Current Every Day Smoker  . Smokeless tobacco: Never Used  . Alcohol Use: Yes     Comment: rarely    OB History   Grav Para Term Preterm Abortions TAB SAB Ect Mult Living                  Review of Systems  Constitutional: Negative for fever and chills.  HENT: Negative for neck pain and neck stiffness.   Respiratory: Negative for chest tightness and shortness of breath.   Cardiovascular: Positive for chest pain. Negative for palpitations and leg swelling.  Gastrointestinal: Negative for nausea, vomiting, abdominal pain and diarrhea.  Musculoskeletal: Positive for myalgias. Negative for back pain and arthralgias.  Skin: Negative for rash and wound.  Neurological: Negative for dizziness, weakness, light-headedness, numbness and headaches.  All other systems reviewed and are negative.    Allergies  Lipitor; Naproxen; and Other  Home Medications   Current Outpatient Rx  Name  Route  Sig  Dispense  Refill  . albuterol  (PROVENTIL) 4 MG tablet   Oral   Take 2 mg by mouth 3 (three) times daily as needed (for shortness of breath).         . Cholecalciferol (VITAMIN D-3) 5000 UNITS TABS   Oral   Take 1 tablet by mouth daily.           . diazepam (VALIUM) 10 MG tablet   Oral   Take 10 mg by mouth daily as needed for anxiety.         Marland Kitchen esomeprazole (NEXIUM) 40 MG capsule   Oral   Take 40 mg by mouth daily before breakfast.         . ezetimibe-simvastatin (VYTORIN) 10-20 MG per tablet   Oral   Take 1 tablet by mouth at bedtime.           . gabapentin (NEURONTIN) 300 MG capsule   Oral   Take 300 mg by mouth 2 (two) times daily.         Marland Kitchen lisinopril (PRINIVIL,ZESTRIL) 20 MG tablet   Oral   Take 20 mg by mouth daily.           Marland Kitchen loratadine (CLARITIN) 10 MG tablet   Oral   Take 10 mg by mouth daily.           . metFORMIN (GLUCOPHAGE) 500 MG tablet   Oral   Take  500 mg by mouth 2 (two) times daily with a meal.           . Multiple Vitamin (MULTIVITAMIN WITH MINERALS) TABS   Oral   Take 1 tablet by mouth daily.         Marland Kitchen senna-docusate (SENOKOT-S) 8.6-50 MG per tablet   Oral   Take 1 tablet by mouth daily as needed. For constipation         . HYDROcodone-acetaminophen (NORCO) 5-325 MG per tablet   Oral   Take 1 tablet by mouth every 4 (four) hours as needed for pain.   10 tablet   0   . methocarbamol (ROBAXIN) 500 MG tablet   Oral   Take 1 tablet (500 mg total) by mouth 2 (two) times daily.   20 tablet   0     BP 100/58  Temp(Src) 98.3 F (36.8 C) (Oral)  Resp 18  SpO2 99%  Physical Exam  Nursing note and vitals reviewed. Constitutional: She is oriented to person, place, and time. She appears well-developed and well-nourished. No distress.  HENT:  Head: Normocephalic and atraumatic.  Mouth/Throat: Oropharynx is clear and moist.  Eyes: EOM are normal. Pupils are equal, round, and reactive to light.  Neck: Normal range of motion. Neck supple.  No  posterior cervical tenderness  Cardiovascular: Normal rate and regular rhythm.   Pulmonary/Chest: Effort normal and breath sounds normal. No respiratory distress. She has no wheezes. She has no rales. She exhibits tenderness (Pain is completely reproduced with palapation of chest wall and R deltoid. No crepitance or deformity. ).  Abdominal: Soft. Bowel sounds are normal. She exhibits no distension and no mass. There is no tenderness. There is no rebound and no guarding.  Musculoskeletal: Normal range of motion. She exhibits no edema and no tenderness.  Neurological: She is alert and oriented to person, place, and time.  5/5 motor in all ext, sensation intact  Skin: Skin is warm and dry. No rash noted. No erythema.  Psychiatric: She has a normal mood and affect. Her behavior is normal.    ED Course  Procedures (including critical care time)  Labs Reviewed  CBC - Abnormal; Notable for the following:    HCT 34.7 (*)    All other components within normal limits  BASIC METABOLIC PANEL - Abnormal; Notable for the following:    Glucose, Bld 106 (*)    Creatinine, Ser 1.21 (*)    GFR calc non Af Amer 50 (*)    GFR calc Af Amer 58 (*)    All other components within normal limits   Dg Chest 2 View  02/21/2013  *RADIOLOGY REPORT*  Clinical Data: 52 year old female left chest pain.  CHEST - 2 VIEW  Comparison: 11/04/2012 and earlier.  Findings: Interval multilevel cervical ACDF.  Hardware incompletely visible on the lateral view.  AP view shows a degree of hardware obliquity.  Cardiac size and mediastinal contours are within normal limits.  No pneumothorax, pulmonary edema, pleural effusion or confluent pulmonary opacity.  Mildly lower lung volumes.  IMPRESSION: Mildly lower lung volumes. No acute cardiopulmonary abnormality.   Original Report Authenticated By: Erskine Speed, M.D.      1. Chest wall muscle strain, initial encounter       Date: 02/21/2013  Rate: 67  Rhythm: normal sinus  rhythm  QRS Axis: normal  Intervals: normal  ST/T Wave abnormalities: normal  Conduction Disutrbances:none  Narrative Interpretation:   Old EKG Reviewed: none available  MDM  Chest pain appears MSK. Will do screening test and treat symptomatically.         Loren Racer, MD 02/21/13 1028

## 2013-02-21 NOTE — ED Notes (Signed)
Patient presents with c/o right chest and right shoulder pain. Onset 2 days ago. Also with nausea and abdominal pain. No dizziness, headache or shortness of breath. Pain worse with movement. Describes pain as sharp, 10/10.

## 2013-02-28 ENCOUNTER — Encounter: Payer: Self-pay | Admitting: Obstetrics & Gynecology

## 2013-04-14 ENCOUNTER — Emergency Department (HOSPITAL_COMMUNITY): Payer: Medicare Other

## 2013-04-14 ENCOUNTER — Encounter (HOSPITAL_COMMUNITY): Payer: Self-pay | Admitting: Emergency Medicine

## 2013-04-14 ENCOUNTER — Emergency Department (HOSPITAL_COMMUNITY)
Admission: EM | Admit: 2013-04-14 | Discharge: 2013-04-14 | Disposition: A | Discharge status: Home / Self Care | Payer: Medicare Other | Attending: Emergency Medicine | Admitting: Emergency Medicine

## 2013-04-14 DIAGNOSIS — S0990XA Unspecified injury of head, initial encounter: Secondary | ICD-10-CM | POA: Insufficient documentation

## 2013-04-14 DIAGNOSIS — F17200 Nicotine dependence, unspecified, uncomplicated: Secondary | ICD-10-CM | POA: Insufficient documentation

## 2013-04-14 DIAGNOSIS — Z9889 Other specified postprocedural states: Secondary | ICD-10-CM | POA: Insufficient documentation

## 2013-04-14 DIAGNOSIS — IMO0002 Reserved for concepts with insufficient information to code with codable children: Secondary | ICD-10-CM | POA: Insufficient documentation

## 2013-04-14 DIAGNOSIS — Y9389 Activity, other specified: Secondary | ICD-10-CM | POA: Insufficient documentation

## 2013-04-14 DIAGNOSIS — Z79899 Other long term (current) drug therapy: Secondary | ICD-10-CM | POA: Insufficient documentation

## 2013-04-14 DIAGNOSIS — E119 Type 2 diabetes mellitus without complications: Secondary | ICD-10-CM | POA: Insufficient documentation

## 2013-04-14 DIAGNOSIS — S0993XA Unspecified injury of face, initial encounter: Secondary | ICD-10-CM | POA: Insufficient documentation

## 2013-04-14 DIAGNOSIS — Y92410 Unspecified street and highway as the place of occurrence of the external cause: Secondary | ICD-10-CM | POA: Insufficient documentation

## 2013-04-14 DIAGNOSIS — I10 Essential (primary) hypertension: Secondary | ICD-10-CM | POA: Insufficient documentation

## 2013-04-14 DIAGNOSIS — M542 Cervicalgia: Secondary | ICD-10-CM

## 2013-04-14 MED ORDER — METHOCARBAMOL 500 MG PO TABS
1000.0000 mg | ORAL_TABLET | Freq: Once | ORAL | Status: AC
  Administered 2013-04-14: 1000 mg via ORAL
  Filled 2013-04-14: qty 2

## 2013-04-14 MED ORDER — METHOCARBAMOL 500 MG PO TABS: 500.0000 mg | ORAL_TABLET | Freq: Three times a day (TID) | ORAL | Status: DC | PRN
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
## 2013-04-15 NOTE — ED Provider Notes (Signed)
Medical screening examination/treatment/procedure(s) were performed by non-physician practitioner and as supervising physician I was immediately available for consultation/collaboration.  Prudie Guthridge L Reola Buckles, MD 04/15/13 0048 

## 2013-05-26 ENCOUNTER — Encounter (HOSPITAL_COMMUNITY): Payer: Self-pay | Admitting: Emergency Medicine

## 2013-05-26 ENCOUNTER — Emergency Department (HOSPITAL_COMMUNITY)
Admission: EM | Admit: 2013-05-26 | Discharge: 2013-05-27 | Disposition: A | Discharge status: Home / Self Care | Payer: Medicare Other | Attending: Emergency Medicine | Admitting: Emergency Medicine

## 2013-05-26 DIAGNOSIS — Z23 Encounter for immunization: Secondary | ICD-10-CM | POA: Insufficient documentation

## 2013-05-26 DIAGNOSIS — T148 Other injury of unspecified body region: Secondary | ICD-10-CM | POA: Insufficient documentation

## 2013-05-26 DIAGNOSIS — E119 Type 2 diabetes mellitus without complications: Secondary | ICD-10-CM | POA: Insufficient documentation

## 2013-05-26 DIAGNOSIS — Y939 Activity, unspecified: Secondary | ICD-10-CM | POA: Insufficient documentation

## 2013-05-26 DIAGNOSIS — W57XXXA Bitten or stung by nonvenomous insect and other nonvenomous arthropods, initial encounter: Secondary | ICD-10-CM | POA: Insufficient documentation

## 2013-05-26 DIAGNOSIS — I10 Essential (primary) hypertension: Secondary | ICD-10-CM | POA: Insufficient documentation

## 2013-05-26 DIAGNOSIS — Z79899 Other long term (current) drug therapy: Secondary | ICD-10-CM | POA: Insufficient documentation

## 2013-05-26 DIAGNOSIS — L299 Pruritus, unspecified: Secondary | ICD-10-CM | POA: Insufficient documentation

## 2013-05-26 DIAGNOSIS — M549 Dorsalgia, unspecified: Secondary | ICD-10-CM | POA: Insufficient documentation

## 2013-05-26 DIAGNOSIS — F17200 Nicotine dependence, unspecified, uncomplicated: Secondary | ICD-10-CM | POA: Insufficient documentation

## 2013-05-26 DIAGNOSIS — Y929 Unspecified place or not applicable: Secondary | ICD-10-CM | POA: Insufficient documentation

## 2013-05-26 DIAGNOSIS — G8929 Other chronic pain: Secondary | ICD-10-CM | POA: Insufficient documentation

## 2013-05-26 MED ORDER — TETANUS-DIPHTH-ACELL PERTUSSIS 5-2.5-18.5 LF-MCG/0.5 IM SUSP
0.5000 mL | Freq: Once | INTRAMUSCULAR | Status: AC
  Administered 2013-05-27: 0.5 mL via INTRAMUSCULAR
  Filled 2013-05-26: qty 0.5

## 2013-05-26 NOTE — ED Provider Notes (Signed)
CSN: [id number]     Arrival date & time 05/26/13  2231 History    This chart was scribed for non-physician practitioner, Wynetta Emery, PA-C, working with Paula Libra, MD, by Yevette Edwards, ED Scribe. This patient was seen in room TR06C/TR06C and the patient's care was started at 11:42 PM.   First MD Initiated Contact with Patient 05/26/13 2332     Chief Complaint  Patient presents with  . Insect Bite    The history is provided by the patient. No language interpreter was used.   HPI Comments: Margaret Webster is a 52 y.o. female who presents to the Emergency Department complaining of a suspected insect bite to her left thumb several hours prior to arrival. She describes her pain as moderate, 5/10, exacerbated by flexion. She reports experiencing a burning pain, soreness, and itching around the affected site. She states the pain is mild, but due to her DM, she wanted to seek treatment. The pt has not treated the pain with any medication. She is unsure of her last tetanus shot.   Past Medical History  Diagnosis Date  . Diabetes mellitus   . Chronic back pain   . Hypertension    Past Surgical History  Procedure Laterality Date  . Back surgery  1993  . Oophorectomy    . Cervical spine surgery Right 11/2012   Family History  Problem Relation Age of Onset  . Cystic fibrosis Sister   . Cancer Sister     ?  Marland Kitchen Heart disease Maternal Grandmother   . Clotting disorder Maternal Grandfather    History  Substance Use Topics  . Smoking status: Current Every Day Smoker  . Smokeless tobacco: Never Used  . Alcohol Use: Yes     Comment: rarely   OB History   Grav Para Term Preterm Abortions TAB SAB Ect Mult Living                 Review of Systems 10 systems reviewed and found to be negative, except as noted in the HPI   Allergies  Influenza vaccines; Lipitor; and Naproxen  Home Medications   Current Outpatient Rx  Name  Route  Sig  Dispense  Refill  . acetaminophen  (TYLENOL) 325 MG tablet   Oral   Take 650 mg by mouth every 6 (six) hours as needed for pain.         . Cholecalciferol (VITAMIN D-3) 5000 UNITS TABS   Oral   Take 1 tablet by mouth daily.           . diazepam (VALIUM) 10 MG tablet   Oral   Take 10 mg by mouth daily as needed for anxiety.         Marland Kitchen esomeprazole (NEXIUM) 40 MG capsule   Oral   Take 40 mg by mouth daily before breakfast.         . ezetimibe-simvastatin (VYTORIN) 10-20 MG per tablet   Oral   Take 1 tablet by mouth at bedtime.           . gabapentin (NEURONTIN) 300 MG capsule   Oral   Take 300 mg by mouth daily as needed (pain).          Marland Kitchen lisinopril (PRINIVIL,ZESTRIL) 20 MG tablet   Oral   Take 20 mg by mouth daily.           Marland Kitchen loratadine (CLARITIN) 10 MG tablet   Oral   Take 10 mg by mouth daily.           Marland Kitchen  metFORMIN (GLUCOPHAGE) 500 MG tablet   Oral   Take 500 mg by mouth 2 (two) times daily.          . methocarbamol (ROBAXIN) 500 MG tablet   Oral   Take 1 tablet (500 mg total) by mouth 3 (three) times daily as needed.   16 tablet   0   . oxyCODONE-acetaminophen (PERCOCET) 10-325 MG per tablet   Oral   Take 1 tablet by mouth 2 (two) times daily.          Marland Kitchen senna-docusate (SENOKOT-S) 8.6-50 MG per tablet   Oral   Take 1 tablet by mouth daily as needed. For constipation          Triage Vitals: BP 182/87  Pulse 80  Temp(Src) 98.4 F (36.9 C) (Oral)  Resp 14  SpO2 100%  Physical Exam  Nursing note and vitals reviewed. Constitutional: She is oriented to person, place, and time. She appears well-developed and well-nourished. No distress.  HENT:  Head: Normocephalic.  Eyes: Conjunctivae and EOM are normal.  Cardiovascular: Normal rate.   Pulmonary/Chest: Effort normal. No stridor.  Musculoskeletal: Normal range of motion.  Neurological: She is alert and oriented to person, place, and time.  Skin:  Interphalangeal joint on the palmar side shows a pinpoint puncture  wound, no erythema, full range of motion and joint in both flexion and extension.  Psychiatric: She has a normal mood and affect.    ED Course   Procedures (including critical care time)  Labs Reviewed - No data to display No results found. No diagnosis found.  MDM   Filed Vitals:   05/26/13 2234  BP: 182/87  Pulse: 80  Temp: 98.4 F (36.9 C)  TempSrc: Oral  Resp: 14  SpO2: 100%     Margaret Webster is a 52 y.o. female insect bite to left thumb. Patient will be updated on her tetanus. Return precautions for infection given.   Medications  TDaP (BOOSTRIX) injection 0.5 mL (0.5 mLs Intramuscular Given 05/27/13 0001)    Pt is hemodynamically stable, appropriate for, and amenable to discharge at this time. Pt verbalized understanding and agrees with care plan. All questions answered. Outpatient follow-up and specific return precautions discussed.    Note: Portions of this report may have been transcribed using voice recognition software. Every effort was made to ensure accuracy; however, inadvertent computerized transcription errors may be present    Wynetta Emery, PA-C 05/27/13 0424

## 2013-05-26 NOTE — ED Notes (Signed)
PT. REPORTS LEFT THUMB INSECT BITE THIS AFTERNOON WITH PAIN AND SLIGHT SWELLING.

## 2013-05-27 NOTE — ED Provider Notes (Signed)
Medical screening examination/treatment/procedure(s) were performed by non-physician practitioner and as supervising physician I was immediately available for consultation/collaboration.   Hanley Seamen, MD 05/27/13 [phone]

## 2013-06-06 ENCOUNTER — Other Ambulatory Visit: Payer: Self-pay | Admitting: Family Medicine

## 2013-06-06 DIAGNOSIS — Z1231 Encounter for screening mammogram for malignant neoplasm of breast: Secondary | ICD-10-CM

## 2013-06-07 ENCOUNTER — Encounter: Payer: Self-pay | Admitting: Obstetrics

## 2013-06-07 ENCOUNTER — Ambulatory Visit (INDEPENDENT_AMBULATORY_CARE_PROVIDER_SITE_OTHER): Payer: Medicare Other | Admitting: Obstetrics

## 2013-06-07 VITALS — BP 171/95 | HR 73 | Temp 99.5°F | Ht 67.5 in | Wt 204.0 lb

## 2013-06-07 DIAGNOSIS — R52 Pain, unspecified: Secondary | ICD-10-CM | POA: Insufficient documentation

## 2013-06-07 DIAGNOSIS — N760 Acute vaginitis: Secondary | ICD-10-CM | POA: Insufficient documentation

## 2013-06-07 DIAGNOSIS — Z0000 Encounter for general adult medical examination without abnormal findings: Secondary | ICD-10-CM

## 2013-06-07 DIAGNOSIS — N390 Urinary tract infection, site not specified: Secondary | ICD-10-CM

## 2013-06-07 LAB — POCT URINALYSIS DIPSTICK
Bilirubin, UA: NEGATIVE
Blood, UA: NEGATIVE
Glucose, UA: NEGATIVE
Ketones, UA: NEGATIVE
Leukocytes, UA: NEGATIVE
Nitrite, UA: NEGATIVE
Spec Grav, UA: <=1.005
Urobilinogen, UA: NEGATIVE
pH, UA: 5

## 2013-06-07 MED ORDER — OXYCODONE-ACETAMINOPHEN 10-325 MG PO TABS: 1.0000 | ORAL_TABLET | Freq: Two times a day (BID) | ORAL | Status: DC

## 2013-06-07 MED ORDER — FLUCONAZOLE 150 MG PO TABS: 150.0000 mg | ORAL_TABLET | Freq: Once | ORAL | Status: DC

## 2013-06-07 NOTE — Progress Notes (Signed)
Subjective:     Margaret Webster is a 52 y.o. female here for a routine exam.  Current complaints: pt is having vaginal discharge with itching. Pt states she is also having pelvic pain. Pt states when she sits down pain shoots across the bottom of her abdomen. Pt states the pain is a constant sharp pain. Pt states she it hurts when she urinates.  Personal health questionnaire reviewed: yes.   Gynecologic History No LMP recorded. Patient is postmenopausal. Contraception: post menopausal status Last Pap: 07/2012. Results were: normal Last mammogram: 06/2012. Results were: normal  Obstetric History OB History   Grav Para Term Preterm Abortions TAB SAB Ect Mult Living                   The following portions of the patient's history were reviewed and updated as appropriate: allergies, current medications, past family history, past medical history, past social history, past surgical history and problem list.  Review of Systems Pertinent items are noted in HPI.    Objective:    General appearance: alert and no distress Abdomen: normal findings: soft, non-tender Pelvic: cervix normal in appearance, external genitalia normal, no adnexal masses or tenderness, no cervical motion tenderness, uterus normal size, shape, and consistency and vagina with whitish discharge.    Assessment:    Yeast vaginitis.    Plan:    Follow up in: several months. Diflucan Rx.

## 2013-06-08 LAB — WET PREP BY MOLECULAR PROBE
Candida species: POSITIVE — AB
Gardnerella vaginalis: POSITIVE — AB
Trichomonas vaginosis: NEGATIVE

## 2013-06-08 LAB — GC/CHLAMYDIA PROBE AMP
CT Probe RNA: NEGATIVE
GC Probe RNA: NEGATIVE

## 2013-06-09 ENCOUNTER — Other Ambulatory Visit: Payer: Self-pay | Admitting: *Deleted

## 2013-06-09 DIAGNOSIS — B9689 Other specified bacterial agents as the cause of diseases classified elsewhere: Secondary | ICD-10-CM

## 2013-06-09 MED ORDER — METRONIDAZOLE 500 MG PO TABS: 500.0000 mg | ORAL_TABLET | Freq: Two times a day (BID) | ORAL | Status: DC

## 2013-06-09 NOTE — Progress Notes (Signed)
Pt aware of results and prescription sent to pt's pharmacy. Pt expressed understanding.  (a prescription for Diflucan had already been sent to pharmacy-per our records with refills.)

## 2013-07-11 ENCOUNTER — Ambulatory Visit (HOSPITAL_COMMUNITY): Payer: Medicare Other | Attending: Family Medicine

## 2013-08-03 ENCOUNTER — Ambulatory Visit: Payer: Medicare Other | Admitting: Obstetrics

## 2013-11-03 ENCOUNTER — Ambulatory Visit: Payer: Medicare Other | Admitting: Obstetrics

## 2013-11-03 ENCOUNTER — Ambulatory Visit (HOSPITAL_COMMUNITY): Payer: Medicare Other

## 2013-11-16 ENCOUNTER — Ambulatory Visit (INDEPENDENT_AMBULATORY_CARE_PROVIDER_SITE_OTHER): Payer: Medicare Other | Admitting: Obstetrics

## 2013-11-16 ENCOUNTER — Encounter: Payer: Self-pay | Admitting: Obstetrics

## 2013-11-16 ENCOUNTER — Ambulatory Visit (HOSPITAL_COMMUNITY)
Admission: RE | Admit: 2013-11-16 | Discharge: 2013-11-16 | Disposition: A | Discharge status: Home / Self Care | Payer: Medicare Other | Source: Ambulatory Visit | Attending: Family Medicine | Admitting: Family Medicine

## 2013-11-16 VITALS — BP 123/82 | HR 67 | Temp 98.1°F | Ht 67.0 in | Wt 200.0 lb

## 2013-11-16 DIAGNOSIS — B373 Candidiasis of vulva and vagina: Secondary | ICD-10-CM

## 2013-11-16 DIAGNOSIS — B3731 Acute candidiasis of vulva and vagina: Secondary | ICD-10-CM

## 2013-11-16 DIAGNOSIS — N949 Unspecified condition associated with female genital organs and menstrual cycle: Secondary | ICD-10-CM

## 2013-11-16 DIAGNOSIS — Z124 Encounter for screening for malignant neoplasm of cervix: Secondary | ICD-10-CM

## 2013-11-16 DIAGNOSIS — Z1231 Encounter for screening mammogram for malignant neoplasm of breast: Secondary | ICD-10-CM

## 2013-11-16 MED ORDER — CYCLOBENZAPRINE HCL 10 MG PO TABS: 10.0000 mg | ORAL_TABLET | Freq: Three times a day (TID) | ORAL | Status: DC | PRN

## 2013-11-16 MED ORDER — FLUCONAZOLE 150 MG PO TABS: 150.0000 mg | ORAL_TABLET | Freq: Once | ORAL | Status: DC

## 2013-11-16 MED ORDER — OXYCODONE HCL 10 MG PO TABS: 10.0000 mg | ORAL_TABLET | Freq: Four times a day (QID) | ORAL | Status: DC | PRN

## 2013-11-16 MED ORDER — CLOTRIMAZOLE 1 % EX SOLN: 1.0000 "application " | Freq: Two times a day (BID) | CUTANEOUS | Status: DC

## 2013-11-16 NOTE — Progress Notes (Signed)
Subjective:     Margaret Webster is a 53 y.o. female here for a routine exam.  Current complaints: annual exam. Patient states she has a yeats infection. Patient states she is having discharge with itching. Patient states she is having recurrent yeast infections.  Personal health questionnaire reviewed: yes.   Gynecologic History No LMP recorded. Patient is postmenopausal. Contraception: none Last Pap:07/2012 . Results were: normal Last mammogram: 11-16-13. Results were: awaiting results  Obstetric History OB History  No data available     The following portions of the patient's history were reviewed and updated as appropriate: allergies, current medications, past family history, past medical history, past social history, past surgical history and problem list.  Review of Systems Pertinent items are noted in HPI.    Objective:    General appearance: alert and no distress Breasts: normal appearance, no masses or tenderness Abdomen: normal findings: Tenderness to palpation RLQ. Pelvic: cervix normal in appearance, external genitalia normal, no adnexal masses or tenderness, no cervical motion tenderness, positive findings: tenderness of right adnexa(e), rectovaginal septum normal, uterus normal size, shape, and consistency and vagina normal without discharge    Assessment:    Healthy female exam.   Pelvic pain.  Vulvovaginal yeast   Plan:    Education reviewed: safe sex/STD prevention, self breast exams and management of pelvic pain.  Vulvovaginal yeast management.. Follow up in: 2 weeks. Diflucan Rx and Clotrimazole Ext. cream Rx.

## 2013-11-16 NOTE — Addendum Note (Signed)
Addended by: Lewie Loron D on: 11/16/2013 04:27 PM   Modules accepted: Orders

## 2013-11-17 LAB — WET PREP BY MOLECULAR PROBE
Candida species: POSITIVE — AB
Gardnerella vaginalis: NEGATIVE
Trichomonas vaginosis: NEGATIVE

## 2013-11-17 LAB — GC/CHLAMYDIA PROBE AMP
CT Probe RNA: NEGATIVE
GC Probe RNA: NEGATIVE

## 2013-11-17 LAB — PAP IG W/ RFLX HPV ASCU

## 2013-11-22 ENCOUNTER — Other Ambulatory Visit: Payer: Self-pay | Admitting: Obstetrics

## 2013-11-22 ENCOUNTER — Ambulatory Visit (HOSPITAL_COMMUNITY)
Admission: RE | Admit: 2013-11-22 | Discharge: 2013-11-22 | Disposition: A | Discharge status: Home / Self Care | Payer: Medicare Other | Source: Ambulatory Visit | Attending: Obstetrics | Admitting: Obstetrics

## 2013-11-22 DIAGNOSIS — N949 Unspecified condition associated with female genital organs and menstrual cycle: Secondary | ICD-10-CM

## 2013-11-22 DIAGNOSIS — N83209 Unspecified ovarian cyst, unspecified side: Secondary | ICD-10-CM | POA: Insufficient documentation

## 2013-11-22 DIAGNOSIS — R1031 Right lower quadrant pain: Secondary | ICD-10-CM | POA: Insufficient documentation

## 2013-11-22 DIAGNOSIS — D250 Submucous leiomyoma of uterus: Secondary | ICD-10-CM | POA: Insufficient documentation

## 2013-11-30 ENCOUNTER — Encounter: Payer: Self-pay | Admitting: Obstetrics

## 2013-11-30 ENCOUNTER — Ambulatory Visit (INDEPENDENT_AMBULATORY_CARE_PROVIDER_SITE_OTHER): Payer: Medicare Other | Admitting: Obstetrics

## 2013-11-30 VITALS — BP 134/75 | HR 68 | Temp 97.0°F | Ht 67.0 in | Wt 201.0 lb

## 2013-11-30 DIAGNOSIS — D259 Leiomyoma of uterus, unspecified: Secondary | ICD-10-CM

## 2013-11-30 NOTE — Progress Notes (Signed)
Subjective:     Margaret Webster is a 53 y.o. female here for a routine exam.  Current complaints: follow up to discuss ultrasound.  Personal health questionnaire reviewed: yes.   Gynecologic History No LMP recorded. Patient is postmenopausal. Contraception: none   Obstetric History OB History  No data available     The following portions of the patient's history were reviewed and updated as appropriate: allergies, current medications, past family history, past medical history, past social history, past surgical history and problem list.  Review of Systems Pertinent items are noted in HPI.    Objective:    No exam performed today, Consult only..    Assessment:    Uterine fibroid, small.  Will follow.   Plan:    Education reviewed: Management of fibroids..  F/U 6 months.

## 2013-12-07 ENCOUNTER — Encounter: Payer: Self-pay | Admitting: Obstetrics

## 2014-04-06 ENCOUNTER — Ambulatory Visit (INDEPENDENT_AMBULATORY_CARE_PROVIDER_SITE_OTHER): Payer: Medicare Other | Admitting: Obstetrics

## 2014-04-06 VITALS — BP 117/72 | HR 69 | Temp 98.5°F | Wt 205.0 lb

## 2014-04-06 DIAGNOSIS — B9689 Other specified bacterial agents as the cause of diseases classified elsewhere: Secondary | ICD-10-CM | POA: Insufficient documentation

## 2014-04-06 DIAGNOSIS — N899 Noninflammatory disorder of vagina, unspecified: Secondary | ICD-10-CM

## 2014-04-06 DIAGNOSIS — N898 Other specified noninflammatory disorders of vagina: Secondary | ICD-10-CM

## 2014-04-06 DIAGNOSIS — R52 Pain, unspecified: Secondary | ICD-10-CM

## 2014-04-06 DIAGNOSIS — N760 Acute vaginitis: Secondary | ICD-10-CM | POA: Insufficient documentation

## 2014-04-06 DIAGNOSIS — A499 Bacterial infection, unspecified: Secondary | ICD-10-CM

## 2014-04-06 DIAGNOSIS — L0232 Furuncle of buttock: Secondary | ICD-10-CM | POA: Insufficient documentation

## 2014-04-06 DIAGNOSIS — L0233 Carbuncle of buttock: Secondary | ICD-10-CM

## 2014-04-06 LAB — POCT URINALYSIS DIPSTICK
Blood, UA: NEGATIVE
Glucose, UA: NEGATIVE
Ketones, UA: NEGATIVE
Leukocytes, UA: NEGATIVE
Nitrite, UA: NEGATIVE
Protein, UA: 2
Spec Grav, UA: 1.02
pH, UA: 5

## 2014-04-06 MED ORDER — OXYCODONE HCL 10 MG PO TABS: 10.0000 mg | ORAL_TABLET | Freq: Four times a day (QID) | ORAL | Status: DC | PRN

## 2014-04-06 MED ORDER — METRONIDAZOLE 500 MG PO TABS: 500.0000 mg | ORAL_TABLET | Freq: Two times a day (BID) | ORAL | Status: DC

## 2014-04-07 ENCOUNTER — Encounter: Payer: Self-pay | Admitting: Obstetrics

## 2014-04-07 DIAGNOSIS — R52 Pain, unspecified: Secondary | ICD-10-CM

## 2014-04-07 DIAGNOSIS — N898 Other specified noninflammatory disorders of vagina: Secondary | ICD-10-CM | POA: Insufficient documentation

## 2014-04-07 DIAGNOSIS — N760 Acute vaginitis: Secondary | ICD-10-CM | POA: Insufficient documentation

## 2014-04-07 HISTORY — DX: Pain, unspecified: R52

## 2014-04-07 LAB — GC/CHLAMYDIA PROBE AMP
CT Probe RNA: NEGATIVE
GC Probe RNA: NEGATIVE

## 2014-04-07 LAB — WET PREP BY MOLECULAR PROBE
Candida species: NEGATIVE
Gardnerella vaginalis: POSITIVE — AB
Trichomonas vaginosis: NEGATIVE

## 2014-04-07 NOTE — Progress Notes (Signed)
Patient ID: Margaret Webster, female   DOB: 1961/07/20, 53 y.o.   MRN: [id number]  Chief Complaint  Patient presents with  . Pelvic Pain    pain in lower pelvis.  some itch/irritation with some d/c    HPI Margaret Webster is a 53 y.o. female.  Lower abdominal pain and malodorous vaginal discharge.  HPI  Past Medical History  Diagnosis Date  . Diabetes mellitus   . Chronic back pain   . Hypertension     Past Surgical History  Procedure Laterality Date  . Back surgery  1993  . Oophorectomy    . Cervical spine surgery Right 11/2012    Family History  Problem Relation Age of Onset  . Cystic fibrosis Sister   . Cancer Sister     ?  Marland Kitchen Heart disease Maternal Grandmother   . Clotting disorder Maternal Grandfather   . Diabetes Mother     Social History History  Substance Use Topics  . Smoking status: Current Every Day Smoker -- 0.50 packs/day  . Smokeless tobacco: Never Used  . Alcohol Use: Yes     Comment: rarely    Allergies  Allergen Reactions  . Influenza Vaccines Hives  . Lipitor [Atorvastatin Calcium] Other (See Comments)    Muscle ache   . Naproxen Nausea And Vomiting    Current Outpatient Prescriptions  Medication Sig Dispense Refill  . acetaminophen (TYLENOL) 325 MG tablet Take 650 mg by mouth every 6 (six) hours as needed for pain.      . Cholecalciferol (VITAMIN D-3) 5000 UNITS TABS Take 1 tablet by mouth daily.        . metFORMIN (GLUCOPHAGE) 500 MG tablet Take 500 mg by mouth 2 (two) times daily.       . simvastatin (ZOCOR) 10 MG tablet Take 10 mg by mouth daily.      . metroNIDAZOLE (FLAGYL) 500 MG tablet Take 1 tablet (500 mg total) by mouth 2 (two) times daily.  14 tablet  2  . Oxycodone HCl 10 MG TABS Take 1 tablet (10 mg total) by mouth every 6 (six) hours as needed.  40 tablet  0  . senna-docusate (SENOKOT-S) 8.6-50 MG per tablet Take 1 tablet by mouth daily as needed. For constipation       Current Facility-Administered Medications  Medication  Dose Route Frequency Provider Last Rate Last Dose  . dextrose 5 % solution   Intravenous Continuous Inda Castle, MD        Review of Systems Review of Systems Constitutional: negative for fatigue and weight loss Respiratory: negative for cough and wheezing Cardiovascular: negative for chest pain, fatigue and palpitations Gastrointestinal: negative for abdominal pain and change in bowel habits Genitourinary:negative Integument/breast: negative for nipple discharge Musculoskeletal:negative for myalgias Neurological: negative for gait problems and tremors Behavioral/Psych: negative for abusive relationship, depression Endocrine: negative for temperature intolerance     Blood pressure 117/72, pulse 69, temperature 98.5 F (36.9 C), weight 205 lb (92.987 kg).  Physical Exam Physical Exam General:   alert  Skin:   no rash or abnormalities  Lungs:   clear to auscultation bilaterally  Heart:   regular rate and rhythm, S1, S2 normal, no murmur, click, rub or gallop  Breasts:   normal without suspicious masses, skin or nipple changes or axillary nodes  Abdomen:  normal findings: no organomegaly, soft, non-tender and no hernia  Pelvis:  External genitalia: normal general appearance Urinary system: urethral meatus normal and bladder without fullness, nontender Vaginal:  normal without tenderness, induration or masses Cervix: normal appearance Adnexa: normal bimanual exam Uterus: anteverted and non-tender, normal size      Data Reviewed Labs Ultrasound  Assessment    Vaginitis  Pelvic pain.  Probable MSK pain. Boil on buttocks    Plan  Flagyl Rx Oxycodone Rx.  Robaxin Rx. Doxycycline Rx  F/U in 6 weeks.    Orders Placed This Encounter  Procedures  . WET PREP BY MOLECULAR PROBE  . GC/Chlamydia Probe Amp  . POCT urinalysis dipstick   Meds ordered this encounter  Medications  . simvastatin (ZOCOR) 10 MG tablet    Sig: Take 10 mg by mouth daily.  . Oxycodone HCl 10  MG TABS    Sig: Take 1 tablet (10 mg total) by mouth every 6 (six) hours as needed.    Dispense:  40 tablet    Refill:  0  . metroNIDAZOLE (FLAGYL) 500 MG tablet    Sig: Take 1 tablet (500 mg total) by mouth 2 (two) times daily.    Dispense:  14 tablet    Refill:  2      Henley Boettner A 04/07/2014, 12:34 AM

## 2014-04-18 ENCOUNTER — Other Ambulatory Visit: Payer: Self-pay | Admitting: Obstetrics

## 2014-05-18 ENCOUNTER — Ambulatory Visit: Payer: Medicare Other | Admitting: Obstetrics

## 2014-05-30 ENCOUNTER — Ambulatory Visit: Payer: Medicare Other | Admitting: Obstetrics

## 2014-06-21 ENCOUNTER — Emergency Department (HOSPITAL_COMMUNITY)
Admission: EM | Admit: 2014-06-21 | Discharge: 2014-06-21 | Disposition: A | Discharge status: Home / Self Care | Payer: Medicare Other | Attending: Emergency Medicine | Admitting: Emergency Medicine

## 2014-06-21 ENCOUNTER — Encounter (HOSPITAL_COMMUNITY): Payer: Self-pay | Admitting: Emergency Medicine

## 2014-06-21 DIAGNOSIS — Z79899 Other long term (current) drug therapy: Secondary | ICD-10-CM | POA: Insufficient documentation

## 2014-06-21 DIAGNOSIS — K089 Disorder of teeth and supporting structures, unspecified: Secondary | ICD-10-CM | POA: Insufficient documentation

## 2014-06-21 DIAGNOSIS — F17200 Nicotine dependence, unspecified, uncomplicated: Secondary | ICD-10-CM | POA: Diagnosis not present

## 2014-06-21 DIAGNOSIS — E119 Type 2 diabetes mellitus without complications: Secondary | ICD-10-CM | POA: Diagnosis not present

## 2014-06-21 DIAGNOSIS — R21 Rash and other nonspecific skin eruption: Secondary | ICD-10-CM | POA: Insufficient documentation

## 2014-06-21 DIAGNOSIS — K029 Dental caries, unspecified: Secondary | ICD-10-CM | POA: Insufficient documentation

## 2014-06-21 DIAGNOSIS — I10 Essential (primary) hypertension: Secondary | ICD-10-CM | POA: Diagnosis not present

## 2014-06-21 DIAGNOSIS — G8929 Other chronic pain: Secondary | ICD-10-CM | POA: Insufficient documentation

## 2014-06-21 DIAGNOSIS — R42 Dizziness and giddiness: Secondary | ICD-10-CM | POA: Insufficient documentation

## 2014-06-21 DIAGNOSIS — K0889 Other specified disorders of teeth and supporting structures: Secondary | ICD-10-CM

## 2014-06-21 LAB — CBG MONITORING, ED: Glucose-Capillary: 91 mg/dL (ref 70–99)

## 2014-06-21 MED ORDER — CLOTRIMAZOLE-BETAMETHASONE 1-0.05 % EX CREA: TOPICAL_CREAM | CUTANEOUS | Status: DC

## 2014-06-21 MED ORDER — HYDROCODONE-ACETAMINOPHEN 5-325 MG PO TABS: 1.0000 | ORAL_TABLET | Freq: Four times a day (QID) | ORAL | Status: DC | PRN

## 2014-06-21 MED ORDER — CLINDAMYCIN HCL 300 MG PO CAPS: 300.0000 mg | ORAL_CAPSULE | Freq: Three times a day (TID) | ORAL | Status: DC

## 2014-06-21 NOTE — ED Notes (Signed)
Per pt sts rash to neck area. sts she has been using cream that her doctor gave her but not working. sts also some right dental pain and has a tooth she needs pulled.

## 2014-06-21 NOTE — Discharge Instructions (Signed)
Dental Caries °Dental caries (also called tooth decay) is the most common oral disease. It can occur at any age but is more common in children and young adults.  °HOW DENTAL CARIES DEVELOPS  °The process of decay begins when bacteria and foods (particularly sugars and starches) combine in your mouth to produce plaque. Plaque is a substance that sticks to the hard, outer surface of a tooth (enamel). The bacteria in plaque produce acids that attack enamel. These acids may also attack the root surface of a tooth (cementum) if it is exposed. Repeated attacks dissolve these surfaces and create holes in the tooth (cavities). If left untreated, the acids destroy the other layers of the tooth.  °RISK FACTORS °· Frequent sipping of sugary beverages.   °· Frequent snacking on sugary and starchy foods, especially those that easily get stuck in the teeth.   °· Poor oral hygiene.   °· Dry mouth.   °· Substance abuse such as methamphetamine abuse.   °· Broken or poor-fitting dental restorations.   °· Eating disorders.   °· Gastroesophageal reflux disease (GERD).   °· Certain radiation treatments to the head and neck. °SYMPTOMS °In the early stages of dental caries, symptoms are seldom present. Sometimes white, chalky areas may be seen on the enamel or other tooth layers. In later stages, symptoms may include: °· Pits and holes on the enamel. °· Toothache after sweet, hot, or cold foods or drinks are consumed. °· Pain around the tooth. °· Swelling around the tooth. °DIAGNOSIS  °Most of the time, dental caries is detected during a regular dental checkup. A diagnosis is made after a thorough medical and dental history is taken and the surfaces of your teeth are checked for signs of dental caries. Sometimes special instruments, such as lasers, are used to check for dental caries. Dental X-ray exams may be taken so that areas not visible to the eye (such as between the contact areas of the teeth) can be checked for cavities.    °TREATMENT  °If dental caries is in its early stages, it may be reversed with a fluoride treatment or an application of a remineralizing agent at the dental office. Thorough brushing and flossing at home is needed to aid these treatments. If it is in its later stages, treatment depends on the location and extent of tooth destruction:  °· If a small area of the tooth has been destroyed, the destroyed area will be removed and cavities will be filled with a material such as gold, silver amalgam, or composite resin.   °· If a large area of the tooth has been destroyed, the destroyed area will be removed and a cap (crown) will be fitted over the remaining tooth structure.   °· If the center part of the tooth (pulp) is affected, a procedure called a root canal will be needed before a filling or crown can be placed.   °· If most of the tooth has been destroyed, the tooth may need to be pulled (extracted). °HOME CARE INSTRUCTIONS °You can prevent, stop, or reverse dental caries at home by practicing good oral hygiene. Good oral hygiene includes: °· Thoroughly cleaning your teeth at least twice a day with a toothbrush and dental floss.   °· Using a fluoride toothpaste. A fluoride mouth rinse may also be used if recommended by your dentist or health care provider.   °· Restricting the amount of sugary and starchy foods and sugary liquids you consume.   °· Avoiding frequent snacking on these foods and sipping of these liquids.   °· Keeping regular visits with   a dentist for checkups and cleanings. PREVENTION   Practice good oral hygiene.  Consider a dental sealant. A dental sealant is a coating material that is applied by your dentist to the pits and grooves of teeth. The sealant prevents food from being trapped in them. It may protect the teeth for several years.  Ask about fluoride supplements if you live in a community without fluorinated water or with water that has a low fluoride content. Use fluoride supplements  as directed by your dentist or health care provider.  Allow fluoride varnish applications to teeth if directed by your dentist or health care provider. Document Released: 07/05/2002 Document Revised: 02/27/2014 Document Reviewed: 10/15/2012 [facility] Patient Information 2015 [place], [place]. This information is not intended to replace advice given to you by your health care provider. Make sure you discuss any questions you have with your health care provider.  Dental Pain A tooth ache may be caused by cavities (tooth decay). Cavities expose the nerve of the tooth to air and hot or cold temperatures. It may come from an infection or abscess (also called a boil or furuncle) around your tooth. It is also often caused by dental caries (tooth decay). This causes the pain you are having. DIAGNOSIS  Your caregiver can diagnose this problem by exam. TREATMENT   If caused by an infection, it may be treated with medications which kill germs (antibiotics) and pain medications as prescribed by your caregiver. Take medications as directed.  Only take over-the-counter or prescription medicines for pain, discomfort, or fever as directed by your caregiver.  Whether the tooth ache today is caused by infection or dental disease, you should see your dentist as soon as possible for further care. SEEK MEDICAL CARE IF: The exam and treatment you received today has been provided on an emergency basis only. This is not a substitute for complete medical or dental care. If your problem worsens or new problems (symptoms) appear, and you are unable to meet with your dentist, call or return to this location. SEEK IMMEDIATE MEDICAL CARE IF:   You have a fever.  You develop redness and swelling of your face, jaw, or neck.  You are unable to open your mouth.  You have severe pain uncontrolled by pain medicine. MAKE SURE YOU:   Understand these instructions.  Will watch your condition.  Will get help right away if  you are not doing well or get worse. Document Released: 10/13/2005 Document Revised: 01/05/2012 Document Reviewed: 05/31/2008 [facility] Patient Information 2015 [place], [place]. This information is not intended to replace advice given to you by your health care provider. Make sure you discuss any questions you have with your health care provider.  Rash A rash is a change in the color or texture of your skin. There are many different types of rashes. You may have other problems that accompany your rash. CAUSES   Infections.  Allergic reactions. This can include allergies to pets or foods.  Certain medicines.  Exposure to certain chemicals, soaps, or cosmetics.  Heat.  Exposure to poisonous plants.  Tumors, both cancerous and noncancerous. SYMPTOMS   Redness.  Scaly skin.  Itchy skin.  Dry or cracked skin.  Bumps.  Blisters.  Pain. DIAGNOSIS  Your caregiver may do a physical exam to determine what type of rash you have. A skin sample (biopsy) may be taken and examined under a microscope. TREATMENT  Treatment depends on the type of rash you have. Your caregiver may prescribe certain medicines. For serious  conditions, you may need to see a skin doctor (dermatologist). HOME CARE INSTRUCTIONS   Avoid the substance that caused your rash.  Do not scratch your rash. This can cause infection.  You may take cool baths to help stop itching.  Only take over-the-counter or prescription medicines as directed by your caregiver.  Keep all follow-up appointments as directed by your caregiver. SEEK IMMEDIATE MEDICAL CARE IF:  You have increasing pain, swelling, or redness.  You have a fever.  You have new or severe symptoms.  You have body aches, diarrhea, or vomiting.  Your rash is not better after 3 days. MAKE SURE YOU:  Understand these instructions.  Will watch your condition.  Will get help right away if you are not doing well or get worse. Document Released:  10/03/2002 Document Revised: 01/05/2012 Document Reviewed: 07/28/2011 [facility] Patient Information 2015 [place], [place]. This information is not intended to replace advice given to you by your health care provider. Make sure you discuss any questions you have with your health care provider.

## 2014-06-21 NOTE — ED Provider Notes (Signed)
CSN: [id number]     Arrival date & time 06/21/14  0909 History   First MD Initiated Contact with Patient 06/21/14 0930     Chief Complaint  Patient presents with  . Rash  . Dental Pain     (Consider location/radiation/quality/duration/timing/severity/associated sxs/prior Treatment) HPI Comments: 53 year old female presents complaining of a toothache in her right upper jaw and a rash on the right side of her neck. She has had a toothache for a few days. She says the mole in and was draining pus last night, and today the pain is not as bad. She has had difficulty eating and is worried about her blood sugar being low. Also she has a rash on the right side of her neck. This initially started one month ago, he was treated with a clotrimazole solution. It got better, but then came back about a week ago and has been getting worse. She did not try using the solution again. The rash is itchy and painful. She says that was draining pus as well. No systemic symptoms at this time, although she does admit to being dizzy a few days ago.  Patient is a 53 y.o. female presenting with rash and tooth pain.  Rash Dental Pain   Past Medical History  Diagnosis Date  . Diabetes mellitus   . Chronic back pain   . Hypertension    Past Surgical History  Procedure Laterality Date  . Back surgery  1993  . Oophorectomy    . Cervical spine surgery Right 11/2012   Family History  Problem Relation Age of Onset  . Cystic fibrosis Sister   . Cancer Sister     ?  Marland Kitchen Heart disease Maternal Grandmother   . Clotting disorder Maternal Grandfather   . Diabetes Mother    History  Substance Use Topics  . Smoking status: Current Every Day Smoker -- 0.50 packs/day  . Smokeless tobacco: Never Used  . Alcohol Use: Yes     Comment: rarely   OB History   Grav Para Term Preterm Abortions TAB SAB Ect Mult Living                 Review of Systems  HENT: Positive for dental problem.   Skin: Positive for rash.   Neurological: Positive for dizziness.  All other systems reviewed and are negative.     Allergies  Influenza vaccines; Lipitor; and Naproxen  Home Medications   Prior to Admission medications   Medication Sig Start Date End Date Taking? Authorizing Provider  acetaminophen (TYLENOL) 325 MG tablet Take 650 mg by mouth every 6 (six) hours as needed for pain.    Historical Provider, MD  Cholecalciferol (VITAMIN D-3) 5000 UNITS TABS Take 1 tablet by mouth daily.      Historical Provider, MD  clindamycin (CLEOCIN) 300 MG capsule Take 1 capsule (300 mg total) by mouth 3 (three) times daily. 06/21/14   Liam Graham, PA-C  clotrimazole-betamethasone (LOTRISONE) cream Apply to affected area 2 times daily prn 06/21/14   Liam Graham, PA-C  HYDROcodone-acetaminophen (NORCO) 5-325 MG per tablet Take 1 tablet by mouth every 6 (six) hours as needed for moderate pain. 06/21/14   Liam Graham, PA-C  metFORMIN (GLUCOPHAGE) 500 MG tablet Take 500 mg by mouth 2 (two) times daily.     Historical Provider, MD  metroNIDAZOLE (FLAGYL) 500 MG tablet Take 1 tablet (500 mg total) by mouth 2 (two) times daily. 04/06/14   Shelly Bombard, MD  Oxycodone  HCl 10 MG TABS Take 1 tablet (10 mg total) by mouth every 6 (six) hours as needed. 04/06/14   Shelly Bombard, MD  senna-docusate (SENOKOT-S) 8.6-50 MG per tablet Take 1 tablet by mouth daily as needed. For constipation    Historical Provider, MD  simvastatin (ZOCOR) 10 MG tablet Take 10 mg by mouth daily.    Historical Provider, MD   BP 136/83  Pulse 69  Temp(Src) 98.3 F (36.8 C) (Oral)  Resp 20  SpO2 100% Physical Exam  Nursing note and vitals reviewed. Constitutional: She is oriented to person, place, and time. Vital signs are normal. She appears well-developed and well-nourished. No distress.  HENT:  Head: Normocephalic and atraumatic.  Right Ear: Tympanic membrane, external ear and ear canal normal.  Left Ear: Tympanic membrane, external ear and  ear canal normal.  Mouth/Throat: Uvula is midline. No trismus in the jaw. Abnormal dentition (Multiple missing teeth in the upper and lower jaw, with multiple dental caries. The upper right lateral incisor is very loose). Dental caries present.  Pulmonary/Chest: Effort normal. No respiratory distress.  Lymphadenopathy:       Head (right side): No tonsillar adenopathy present.       Head (left side): No tonsillar adenopathy present.    She has no cervical adenopathy.  Neurological: She is alert and oriented to person, place, and time. She has normal strength. Coordination normal.  Skin: Skin is warm and dry. Rash (on the right side of the neck, there is a 4 cm diameter area of erythema, weeping. Inferior and lateral to this, there is an area with multiple 1 mm papules with a slight scale.) noted. She is not diaphoretic.  Psychiatric: She has a normal mood and affect. Judgment normal.    ED Course  Procedures (including critical care time) Labs Review Labs Reviewed  CBG MONITORING, ED    Imaging Review No results found.   EKG Interpretation None      MDM   Final diagnoses:  Dental caries  Toothache  Rash of neck    Rash may be tinea-type infection, maybe bacterial superinfection given the pain associated with it. Will use clindamycin for this and a dental infection, should cover both. Also Lotrisone. Followup with primary care in one week, and free dental clinic this weekend.   Meds ordered this encounter  Medications  . clindamycin (CLEOCIN) 300 MG capsule    Sig: Take 1 capsule (300 mg total) by mouth 3 (three) times daily.    Dispense:  30 capsule    Refill:  0    Order Specific Question:  Supervising Provider    Answer:  MERRELL, DAVID J [[id number]]  . clotrimazole-betamethasone (LOTRISONE) cream    Sig: Apply to affected area 2 times daily prn    Dispense:  45 g    Refill:  0    Order Specific Question:  Supervising Provider    Answer:  MERRELL, DAVID J [[id number]]  .  HYDROcodone-acetaminophen (NORCO) 5-325 MG per tablet    Sig: Take 1 tablet by mouth every 6 (six) hours as needed for moderate pain.    Dispense:  12 tablet    Refill:  0    Order Specific Question:  Supervising Provider    Answer:  MERRELL, DAVID J [[id number]]     Followup with free dental clinic.    Liam Graham, PA-C 06/21/14 [phone]

## 2014-06-21 NOTE — ED Provider Notes (Signed)
Medical screening examination/treatment/procedure(s) were performed by non-physician practitioner and as supervising physician I was immediately available for consultation/collaboration.  Philipp Deputy, M.D.   Harden Mo, MD 06/21/14 [phone]

## 2014-07-05 ENCOUNTER — Emergency Department (HOSPITAL_COMMUNITY)
Admission: EM | Admit: 2014-07-05 | Discharge: 2014-07-05 | Disposition: A | Discharge status: Home / Self Care | Payer: Medicare Other | Attending: Emergency Medicine | Admitting: Emergency Medicine

## 2014-07-05 ENCOUNTER — Encounter (HOSPITAL_COMMUNITY): Payer: Self-pay | Admitting: Emergency Medicine

## 2014-07-05 ENCOUNTER — Emergency Department (HOSPITAL_COMMUNITY): Payer: Medicare Other

## 2014-07-05 DIAGNOSIS — Z79899 Other long term (current) drug therapy: Secondary | ICD-10-CM | POA: Insufficient documentation

## 2014-07-05 DIAGNOSIS — Z792 Long term (current) use of antibiotics: Secondary | ICD-10-CM | POA: Diagnosis not present

## 2014-07-05 DIAGNOSIS — G8929 Other chronic pain: Secondary | ICD-10-CM | POA: Insufficient documentation

## 2014-07-05 DIAGNOSIS — E119 Type 2 diabetes mellitus without complications: Secondary | ICD-10-CM | POA: Insufficient documentation

## 2014-07-05 DIAGNOSIS — R059 Cough, unspecified: Secondary | ICD-10-CM | POA: Diagnosis present

## 2014-07-05 DIAGNOSIS — J189 Pneumonia, unspecified organism: Secondary | ICD-10-CM

## 2014-07-05 DIAGNOSIS — I10 Essential (primary) hypertension: Secondary | ICD-10-CM | POA: Insufficient documentation

## 2014-07-05 DIAGNOSIS — R05 Cough: Secondary | ICD-10-CM | POA: Diagnosis present

## 2014-07-05 DIAGNOSIS — J209 Acute bronchitis, unspecified: Secondary | ICD-10-CM | POA: Insufficient documentation

## 2014-07-05 DIAGNOSIS — J159 Unspecified bacterial pneumonia: Secondary | ICD-10-CM | POA: Insufficient documentation

## 2014-07-05 DIAGNOSIS — R6883 Chills (without fever): Secondary | ICD-10-CM | POA: Diagnosis not present

## 2014-07-05 DIAGNOSIS — J40 Bronchitis, not specified as acute or chronic: Secondary | ICD-10-CM

## 2014-07-05 DIAGNOSIS — F17200 Nicotine dependence, unspecified, uncomplicated: Secondary | ICD-10-CM | POA: Insufficient documentation

## 2014-07-05 HISTORY — DX: Bronchitis, not specified as acute or chronic: J40

## 2014-07-05 LAB — CBG MONITORING, ED: Glucose-Capillary: 119 mg/dL — ABNORMAL HIGH (ref 70–99)

## 2014-07-05 MED ORDER — PREDNISONE 20 MG PO TABS
60.0000 mg | ORAL_TABLET | Freq: Once | ORAL | Status: AC
  Administered 2014-07-05: 60 mg via ORAL
  Filled 2014-07-05: qty 3

## 2014-07-05 MED ORDER — AZITHROMYCIN 250 MG PO TABS: ORAL_TABLET | ORAL | Status: DC

## 2014-07-05 MED ORDER — IPRATROPIUM-ALBUTEROL 0.5-2.5 (3) MG/3ML IN SOLN
3.0000 mL | Freq: Once | RESPIRATORY_TRACT | Status: AC
Start: 2014-07-05 — End: 2014-07-05
  Administered 2014-07-05: 3 mL via RESPIRATORY_TRACT
  Filled 2014-07-05: qty 3

## 2014-07-05 MED ORDER — AZITHROMYCIN 250 MG PO TABS
500.0000 mg | ORAL_TABLET | Freq: Once | ORAL | Status: AC
  Administered 2014-07-05: 500 mg via ORAL
  Filled 2014-07-05: qty 2

## 2014-07-05 MED ORDER — KETOROLAC TROMETHAMINE 30 MG/ML IJ SOLN
30.0000 mg | Freq: Once | INTRAMUSCULAR | Status: AC
  Administered 2014-07-05: 30 mg via INTRAMUSCULAR
  Filled 2014-07-05: qty 1

## 2014-07-05 MED ORDER — PREDNISONE 20 MG PO TABS: 60.0000 mg | ORAL_TABLET | Freq: Once | ORAL | Status: DC

## 2014-07-05 NOTE — Discharge Instructions (Signed)
Pneumonia  You will be placed on steroids for your wheezing and an antibiotic for the pneumonia.  You should continue to monitor glucoses while on steroids.  Pneumonia is an infection of the lungs.  CAUSES Pneumonia may be caused by bacteria or a virus. Usually, these infections are caused by breathing infectious particles into the lungs (respiratory tract). SIGNS AND SYMPTOMS   Cough.  Fever.  Chest pain.  Increased rate of breathing.  Wheezing.  Mucus production. DIAGNOSIS  If you have the common symptoms of pneumonia, your health care provider will typically confirm the diagnosis with a chest X-ray. The X-ray will show an abnormality in the lung (pulmonary infiltrate) if you have pneumonia. Other tests of your blood, urine, or sputum may be done to find the specific cause of your pneumonia. Your health care provider may also do tests (blood gases or pulse oximetry) to see how well your lungs are working. TREATMENT  Some forms of pneumonia may be spread to other people when you cough or sneeze. You may be asked to wear a mask before and during your exam. Pneumonia that is caused by bacteria is treated with antibiotic medicine. Pneumonia that is caused by the influenza virus may be treated with an antiviral medicine. Most other viral infections must run their course. These infections will not respond to antibiotics.  HOME CARE INSTRUCTIONS   Cough suppressants may be used if you are losing too much rest. However, coughing protects you by clearing your lungs. You should avoid using cough suppressants if you can.  Your health care provider may have prescribed medicine if he or she thinks your pneumonia is caused by bacteria or influenza. Finish your medicine even if you start to feel better.  Your health care provider may also prescribe an expectorant. This loosens the mucus to be coughed up.  Take medicines only as directed by your health care provider.  Do not smoke. Smoking is a  common cause of bronchitis and can contribute to pneumonia. If you are a smoker and continue to smoke, your cough may last several weeks after your pneumonia has cleared.  A cold steam vaporizer or humidifier in your room or home may help loosen mucus.  Coughing is often worse at night. Sleeping in a semi-upright position in a recliner or using a couple pillows under your head will help with this.  Get rest as you feel it is needed. Your body will usually let you know when you need to rest. PREVENTION A pneumococcal shot (vaccine) is available to prevent a common bacterial cause of pneumonia. This is usually suggested for:  People over 55 years old.  Patients on chemotherapy.  People with chronic lung problems, such as bronchitis or emphysema.  People with immune system problems. If you are over 65 or have a high risk condition, you may receive the pneumococcal vaccine if you have not received it before. In some countries, a routine influenza vaccine is also recommended. This vaccine can help prevent some cases of pneumonia.You may be offered the influenza vaccine as part of your care. If you smoke, it is time to quit. You may receive instructions on how to stop smoking. Your health care provider can provide medicines and counseling to help you quit. SEEK MEDICAL CARE IF: You have a fever. SEEK IMMEDIATE MEDICAL CARE IF:   Your illness becomes worse. This is especially true if you are elderly or weakened from any other disease.  You cannot control your cough with suppressants and  are losing sleep.  You begin coughing up blood.  You develop pain which is getting worse or is uncontrolled with medicines.  Any of the symptoms which initially brought you in for treatment are getting worse rather than better.  You develop shortness of breath or chest pain. MAKE SURE YOU:   Understand these instructions.  Will watch your condition.  Will get help right away if you are not doing well  or get worse. Document Released: 10/13/2005 Document Revised: 02/27/2014 Document Reviewed: 01/02/2011 [facility] Patient Information 2015 [place], [place]. This information is not intended to replace advice given to you by your health care provider. Make sure you discuss any questions you have with your health care provider.

## 2014-07-05 NOTE — ED Notes (Signed)
Pt c/o productive cough with green sputum; pt sts hx of bronchitis and feels similar; pt sts pain with cough and HA

## 2014-07-05 NOTE — ED Provider Notes (Signed)
CSN: [id number]     Arrival date & time 07/05/14  0750 History   First MD Initiated Contact with Patient 07/05/14 0800     Chief Complaint  Patient presents with  . Cough  . URI     (Consider location/radiation/quality/duration/timing/severity/associated sxs/prior Treatment) HPI  This is a 53 year old female with a history of diabetes and bronchitis who presents with cough. Patient reports cough since Saturday. She reports production of green sputum. She has associated chest pain but only when coughing. She also reports shortness of breath. Denies any fevers but does endorse chills. No known sick contacts. Patient is a current smoker. She does have a history of bronchitis.  Patient reports that she tried an inhaler at home which did not seem to help. She reports rhinorrhea. She denies any nausea, vomiting, diarrhea, abdominal pain.  Past Medical History  Diagnosis Date  . Diabetes mellitus   . Chronic back pain   . Hypertension   . Bronchitis    Past Surgical History  Procedure Laterality Date  . Back surgery  1993  . Oophorectomy    . Cervical spine surgery Right 11/2012   Family History  Problem Relation Age of Onset  . Cystic fibrosis Sister   . Cancer Sister     ?  Marland Kitchen Heart disease Maternal Grandmother   . Clotting disorder Maternal Grandfather   . Diabetes Mother    History  Substance Use Topics  . Smoking status: Current Every Day Smoker -- 0.50 packs/day  . Smokeless tobacco: Never Used  . Alcohol Use: Yes     Comment: rarely   OB History   Grav Para Term Preterm Abortions TAB SAB Ect Mult Living                 Review of Systems  Constitutional: Positive for chills. Negative for fever.  HENT: Positive for rhinorrhea.   Respiratory: Positive for cough, chest tightness and shortness of breath.   Cardiovascular: Positive for chest pain. Negative for leg swelling.  Gastrointestinal: Negative for nausea, vomiting and abdominal pain.  Genitourinary: Negative for  dysuria.  Skin: Negative for wound.  Neurological: Negative for headaches.  All other systems reviewed and are negative.     Allergies  Sulfa antibiotics; Influenza vaccines; Lipitor; and Naproxen  Home Medications   Prior to Admission medications   Medication Sig Start Date End Date Taking? Authorizing Provider  albuterol (PROVENTIL HFA;VENTOLIN HFA) 108 (90 BASE) MCG/ACT inhaler Inhale 2 puffs into the lungs every 6 (six) hours as needed for wheezing or shortness of breath.   Yes Historical Provider, MD  butalbital-acetaminophen-caffeine (FIORICET, ESGIC) 50-325-40 MG per tablet Take 1 tablet by mouth daily as needed for headache or migraine.  06/08/14  Yes Historical Provider, MD  Cholecalciferol (VITAMIN D-3) 5000 UNITS TABS Take 5,000 Units by mouth daily.    Yes Historical Provider, MD  clindamycin (CLEOCIN) 300 MG capsule Take 1 capsule (300 mg total) by mouth 3 (three) times daily. 06/21/14  Yes Liam Graham, PA-C  clotrimazole-betamethasone (LOTRISONE) cream Apply 1 application topically 2 (two) times daily.   Yes Historical Provider, MD  HYDROcodone-acetaminophen (NORCO) 5-325 MG per tablet Take 1 tablet by mouth every 6 (six) hours as needed for moderate pain. 06/21/14  Yes Liam Graham, PA-C  levothyroxine (SYNTHROID, LEVOTHROID) 125 MCG tablet Take 125 mcg by mouth daily. 05/23/14  Yes Historical Provider, MD  lisinopril (PRINIVIL,ZESTRIL) 20 MG tablet Take 20 mg by mouth daily. 06/18/14  Yes Historical Provider, MD  LYRICA 75 MG capsule Take 75 mg by mouth daily. 04/26/14  Yes Historical Provider, MD  metFORMIN (GLUCOPHAGE) 500 MG tablet Take 500 mg by mouth 2 (two) times daily.    Yes Historical Provider, MD  senna-docusate (SENOKOT-S) 8.6-50 MG per tablet Take 1 tablet by mouth daily as needed for moderate constipation.    Yes Historical Provider, MD  simvastatin (ZOCOR) 10 MG tablet Take 10 mg by mouth daily.   Yes Historical Provider, MD  ZETIA 10 MG tablet Take 10 mg by  mouth daily. 06/18/14  Yes Historical Provider, MD  acetaminophen (TYLENOL) 325 MG tablet Take 325 mg by mouth every 6 (six) hours as needed for moderate pain or headache.     Historical Provider, MD  azithromycin (ZITHROMAX) 250 MG tablet Take 250 mg (1 tablet) by mouth daily for the next four days. 07/05/14   Merryl Hacker, MD  predniSONE (DELTASONE) 20 MG tablet Take 3 tablets (60 mg total) by mouth once. 07/05/14   Merryl Hacker, MD   BP 119/68  Pulse 57  Temp(Src) 98 F (36.7 C) (Oral)  Resp 20  SpO2 97% Physical Exam  Nursing note and vitals reviewed. Constitutional: She is oriented to person, place, and time. She appears well-developed and well-nourished.  HENT:  Head: Normocephalic and atraumatic.  Mouth/Throat: Oropharynx is clear and moist.  Eyes: Pupils are equal, round, and reactive to light.  Neck: Neck supple.  Cardiovascular: Normal rate, regular rhythm and normal heart sounds.   No murmur heard. Pulmonary/Chest: Effort normal. No respiratory distress. She has wheezes. She exhibits tenderness.  Actively coughing  Abdominal: Soft. Bowel sounds are normal. There is no tenderness. There is no rebound.  Musculoskeletal: She exhibits no edema.  Neurological: She is alert and oriented to person, place, and time.  Skin: Skin is warm and dry.  Psychiatric: She has a normal mood and affect.    ED Course  Procedures (including critical care time) Labs Review Labs Reviewed  CBG MONITORING, ED - Abnormal; Notable for the following:    Glucose-Capillary 119 (*)    All other components within normal limits    Imaging Review Dg Chest 2 View  07/05/2014   CLINICAL DATA:  Cough. Upper respiratory infection. Shortness of breath. Right-sided chest pain.  EXAM: CHEST  2 VIEW  COMPARISON:  01/2013  FINDINGS: The cardiomediastinal silhouette is within normal limits. Lung volumes are unchanged. New hazy infiltrate is questioned in the right lung base. Left lung remains clear. No  pleural effusion or pneumothorax is identified. No acute osseous abnormality is seen. Obliquely oriented lower cervical spine fusion plate is again seen.  IMPRESSION: Possible right basilar infiltrate, which may reflect developing infection.   Electronically Signed   By: Logan Bores   On: 07/05/2014 08:49     EKG Interpretation None      MDM   Final diagnoses:  Community acquired pneumonia  Bronchitis    Patient presents with cough and reproducible chest pain on exam. Expiratory wheezing with chest wall tenderness and active coughing. Patient is afebrile. Patient given a DuoNeb and a chest x-ray was obtained. Chest x-ray concerning for developing infiltrate. Given symptoms, will place on azithromycin. Patient was also given prednisone given wheezing and history of smoking. Discussed with patient need to monitor blood sugars while on prednisone. She will use albuterol for cough when necessary.  After history, exam, and medical workup I feel the patient has been appropriately medically screened and is safe for discharge home.  Pertinent diagnoses were discussed with the patient. Patient was given return precautions.     Merryl Hacker, MD 07/05/14 [phone]

## 2014-07-20 ENCOUNTER — Emergency Department (HOSPITAL_BASED_OUTPATIENT_CLINIC_OR_DEPARTMENT_OTHER): Payer: Medicare Other

## 2014-07-20 ENCOUNTER — Emergency Department (HOSPITAL_BASED_OUTPATIENT_CLINIC_OR_DEPARTMENT_OTHER)
Admission: EM | Admit: 2014-07-20 | Discharge: 2014-07-20 | Disposition: A | Discharge status: Home / Self Care | Payer: Medicare Other | Attending: Emergency Medicine | Admitting: Emergency Medicine

## 2014-07-20 ENCOUNTER — Encounter (HOSPITAL_BASED_OUTPATIENT_CLINIC_OR_DEPARTMENT_OTHER): Payer: Self-pay | Admitting: Emergency Medicine

## 2014-07-20 DIAGNOSIS — IMO0002 Reserved for concepts with insufficient information to code with codable children: Secondary | ICD-10-CM | POA: Insufficient documentation

## 2014-07-20 DIAGNOSIS — Z79899 Other long term (current) drug therapy: Secondary | ICD-10-CM | POA: Diagnosis not present

## 2014-07-20 DIAGNOSIS — W010XXA Fall on same level from slipping, tripping and stumbling without subsequent striking against object, initial encounter: Secondary | ICD-10-CM | POA: Diagnosis not present

## 2014-07-20 DIAGNOSIS — S161XXA Strain of muscle, fascia and tendon at neck level, initial encounter: Secondary | ICD-10-CM

## 2014-07-20 DIAGNOSIS — S0993XA Unspecified injury of face, initial encounter: Secondary | ICD-10-CM | POA: Diagnosis present

## 2014-07-20 DIAGNOSIS — Y939 Activity, unspecified: Secondary | ICD-10-CM | POA: Diagnosis not present

## 2014-07-20 DIAGNOSIS — E119 Type 2 diabetes mellitus without complications: Secondary | ICD-10-CM | POA: Insufficient documentation

## 2014-07-20 DIAGNOSIS — J40 Bronchitis, not specified as acute or chronic: Secondary | ICD-10-CM | POA: Diagnosis not present

## 2014-07-20 DIAGNOSIS — G8929 Other chronic pain: Secondary | ICD-10-CM | POA: Diagnosis not present

## 2014-07-20 DIAGNOSIS — F17200 Nicotine dependence, unspecified, uncomplicated: Secondary | ICD-10-CM | POA: Diagnosis not present

## 2014-07-20 DIAGNOSIS — S199XXA Unspecified injury of neck, initial encounter: Secondary | ICD-10-CM

## 2014-07-20 DIAGNOSIS — Y929 Unspecified place or not applicable: Secondary | ICD-10-CM | POA: Diagnosis not present

## 2014-07-20 DIAGNOSIS — W19XXXA Unspecified fall, initial encounter: Secondary | ICD-10-CM

## 2014-07-20 DIAGNOSIS — S139XXA Sprain of joints and ligaments of unspecified parts of neck, initial encounter: Secondary | ICD-10-CM | POA: Diagnosis not present

## 2014-07-20 DIAGNOSIS — S335XXA Sprain of ligaments of lumbar spine, initial encounter: Secondary | ICD-10-CM | POA: Insufficient documentation

## 2014-07-20 DIAGNOSIS — I10 Essential (primary) hypertension: Secondary | ICD-10-CM | POA: Insufficient documentation

## 2014-07-20 DIAGNOSIS — S39012A Strain of muscle, fascia and tendon of lower back, initial encounter: Secondary | ICD-10-CM

## 2014-07-20 MED ORDER — DIAZEPAM 5 MG PO TABS: 5.0000 mg | ORAL_TABLET | Freq: Two times a day (BID) | ORAL | Status: DC

## 2014-07-20 MED ORDER — DIAZEPAM 5 MG PO TABS
5.0000 mg | ORAL_TABLET | Freq: Once | ORAL | Status: AC
Start: 2014-07-20 — End: 2014-07-20
  Administered 2014-07-20: 5 mg via ORAL
  Filled 2014-07-20: qty 1

## 2014-07-20 MED ORDER — HYDROCODONE-ACETAMINOPHEN 5-325 MG PO TABS: 1.0000 | ORAL_TABLET | ORAL | Status: DC | PRN

## 2014-07-20 NOTE — Discharge Instructions (Signed)
Take Vicodin for severe pain only. No driving or operating heavy machinery while taking vicodin. This medication may cause drowsiness. Take Valium as needed as directed for muscle spasm. No driving or operating heavy machinery while taking valium. This medication may cause drowsiness.  Cervical Sprain A cervical sprain is when the tissues (ligaments) that hold the neck bones in place stretch or tear. HOME CARE   Put ice on the injured area.  Put ice in a plastic bag.  Place a towel between your skin and the bag.  Leave the ice on for 15-20 minutes, 3-4 times a day.  You may have been given a collar to wear. This collar keeps your neck from moving while you heal.  Do not take the collar off unless told by your doctor.  If you have long hair, keep it outside of the collar.  Ask your doctor before changing the position of your collar. You may need to change its position over time to make it more comfortable.  If you are allowed to take off the collar for cleaning or bathing, follow your doctor's instructions on how to do it safely.  Keep your collar clean by wiping it with mild soap and water. Dry it completely. If the collar has removable pads, remove them every 1-2 days to hand wash them with soap and water. Allow them to air dry. They should be dry before you wear them in the collar.  Do not drive while wearing the collar.  Only take medicine as told by your doctor.  Keep all doctor visits as told.  Keep all physical therapy visits as told.  Adjust your work station so that you have good posture while you work.  Avoid positions and activities that make your problems worse.  Warm up and stretch before being active. GET HELP IF:  Your pain is not controlled with medicine.  You cannot take less pain medicine over time as planned.  Your activity level does not improve as expected. GET HELP RIGHT AWAY IF:   You are bleeding.  Your stomach is upset.  You have an  allergic reaction to your medicine.  You develop new problems that you cannot explain.  You lose feeling (become numb) or you cannot move any part of your body (paralysis).  You have tingling or weakness in any part of your body.  Your symptoms get worse. Symptoms include:  Pain, soreness, stiffness, puffiness (swelling), or a burning feeling in your neck.  Pain when your neck is touched.  Shoulder or upper back pain.  Limited ability to move your neck.  Headache.  Dizziness.  Your hands or arms feel week, lose feeling, or tingle.  Muscle spasms.  Difficulty swallowing or chewing. MAKE SURE YOU:   Understand these instructions.  Will watch your condition.  Will get help right away if you are not doing well or get worse. Document Released: 03/31/2008 Document Revised: 06/15/2013 Document Reviewed: 04/20/2013 [facility] Patient Information 2015 [place], [place]. This information is not intended to replace advice given to you by your health care provider. Make sure you discuss any questions you have with your health care provider.  Lumbosacral Strain Lumbosacral strain is a strain of any of the parts that make up your lumbosacral vertebrae. Your lumbosacral vertebrae are the bones that make up the lower third of your backbone. Your lumbosacral vertebrae are held together by muscles and tough, fibrous tissue (ligaments).  CAUSES  A sudden blow to your back can cause lumbosacral strain. Also,  anything that causes an excessive stretch of the muscles in the low back can cause this strain. This is typically seen when people exert themselves strenuously, fall, lift heavy objects, bend, or crouch repeatedly. RISK FACTORS  Physically demanding work.  Participation in pushing or pulling sports or sports that require a sudden twist of the back (tennis, golf, baseball).  Weight lifting.  Excessive lower back curvature.  Forward-tilted pelvis.  Weak back or abdominal muscles or  both.  Tight hamstrings. SIGNS AND SYMPTOMS  Lumbosacral strain may cause pain in the area of your injury or pain that moves (radiates) down your leg.  DIAGNOSIS Your health care provider can often diagnose lumbosacral strain through a physical exam. In some cases, you may need tests such as X-ray exams.  TREATMENT  Treatment for your lower back injury depends on many factors that your clinician will have to evaluate. However, most treatment will include the use of anti-inflammatory medicines. HOME CARE INSTRUCTIONS   Avoid hard physical activities (tennis, racquetball, waterskiing) if you are not in proper physical condition for it. This may aggravate or create problems.  If you have a back problem, avoid sports requiring sudden body movements. Swimming and walking are generally safer activities.  Maintain good posture.  Maintain a healthy weight.  For acute conditions, you may put ice on the injured area.  Put ice in a plastic bag.  Place a towel between your skin and the bag.  Leave the ice on for 20 minutes, 2-3 times a day.  When the low back starts healing, stretching and strengthening exercises may be recommended. SEEK MEDICAL CARE IF:  Your back pain is getting worse.  You experience severe back pain not relieved with medicines. SEEK IMMEDIATE MEDICAL CARE IF:   You have numbness, tingling, weakness, or problems with the use of your arms or legs.  There is a change in bowel or bladder control.  You have increasing pain in any area of the body, including your belly (abdomen).  You notice shortness of breath, dizziness, or feel faint.  You feel sick to your stomach (nauseous), are throwing up (vomiting), or become sweaty.  You notice discoloration of your toes or legs, or your feet get very cold. MAKE SURE YOU:   Understand these instructions.  Will watch your condition.  Will get help right away if you are not doing well or get worse. Document Released:  07/23/2005 Document Revised: 10/18/2013 Document Reviewed: 06/01/2013 [facility] Patient Information 2015 [place], [place]. This information is not intended to replace advice given to you by your health care provider. Make sure you discuss any questions you have with your health care provider.

## 2014-07-20 NOTE — ED Provider Notes (Signed)
Medical screening examination/treatment/procedure(s) were performed by non-physician practitioner and as supervising physician I was immediately available for consultation/collaboration.   Dot Lanes, MD 07/20/14 [phone]

## 2014-07-20 NOTE — ED Notes (Signed)
She tripped over her friends dogs this am and fell forward landing on her arms. C.o pain from her neck down to her lower back. Hx of neck surgery. To tx room via w/c. She was able to stand and move to the stretcher without difficulty.

## 2014-07-20 NOTE — ED Provider Notes (Signed)
CSN: [id number]     Arrival date & time 07/20/14  1105 History   First MD Initiated Contact with Patient 07/20/14 1220     No chief complaint on file.    (Consider location/radiation/quality/duration/timing/severity/associated sxs/prior Treatment) HPI Comments: Pt is a 53 y/o female who presents to the ED complaining of neck and lower back pain beginning after tripping and falling over her friend's dogs earlier this morning. Pt reports she fell forward and landed onto her arms causing her neck and lower back to "twitch". Pain radiates from her neck down to her lower back, constant, slightly relieved when she takes pressure off of her back, worse with certain movements. States she always has pain radiating down her right leg, no new radiating pain down extremities. Denies numbness or tingling, loss of control of bowels or bladder or saddle anesthesia. Hx of cervical and lumbar fusion.  The history is provided by the patient.    Past Medical History  Diagnosis Date  . Diabetes mellitus   . Chronic back pain   . Hypertension   . Bronchitis    Past Surgical History  Procedure Laterality Date  . Back surgery  1993  . Oophorectomy    . Cervical spine surgery Right 11/2012   Family History  Problem Relation Age of Onset  . Cystic fibrosis Sister   . Cancer Sister     ?  Marland Kitchen Heart disease Maternal Grandmother   . Clotting disorder Maternal Grandfather   . Diabetes Mother    History  Substance Use Topics  . Smoking status: Current Every Day Smoker -- 0.50 packs/day  . Smokeless tobacco: Never Used  . Alcohol Use: Yes     Comment: rarely   OB History   Grav Para Term Preterm Abortions TAB SAB Ect Mult Living                 Review of Systems  Musculoskeletal: Positive for back pain and neck pain.  All other systems reviewed and are negative.     Allergies  Sulfa antibiotics; Influenza vaccines; Lipitor; and Naproxen  Home Medications   Prior to Admission medications    Medication Sig Start Date End Date Taking? Authorizing Provider  acetaminophen (TYLENOL) 325 MG tablet Take 325 mg by mouth every 6 (six) hours as needed for moderate pain or headache.     Historical Provider, MD  albuterol (PROVENTIL HFA;VENTOLIN HFA) 108 (90 BASE) MCG/ACT inhaler Inhale 2 puffs into the lungs every 6 (six) hours as needed for wheezing or shortness of breath.    Historical Provider, MD  azithromycin (ZITHROMAX) 250 MG tablet Take 250 mg (1 tablet) by mouth daily for the next four days. 07/05/14   Merryl Hacker, MD  butalbital-acetaminophen-caffeine (FIORICET, ESGIC) [phone] MG per tablet Take 1 tablet by mouth daily as needed for headache or migraine.  06/08/14   Historical Provider, MD  Cholecalciferol (VITAMIN D-3) 5000 UNITS TABS Take 5,000 Units by mouth daily.     Historical Provider, MD  clindamycin (CLEOCIN) 300 MG capsule Take 1 capsule (300 mg total) by mouth 3 (three) times daily. 06/21/14   Liam Graham, PA-C  clotrimazole-betamethasone (LOTRISONE) cream Apply 1 application topically 2 (two) times daily.    Historical Provider, MD  diazepam (VALIUM) 5 MG tablet Take 1 tablet (5 mg total) by mouth 2 (two) times daily. 07/20/14   Illene Labrador, PA-C  HYDROcodone-acetaminophen (NORCO) 5-325 MG per tablet Take 1 tablet by mouth every 6 (six) hours as  needed for moderate pain. 06/21/14   Liam Graham, PA-C  HYDROcodone-acetaminophen (NORCO/VICODIN) 5-325 MG per tablet Take 1-2 tablets by mouth every 4 (four) hours as needed. 07/20/14   Illene Labrador, PA-C  levothyroxine (SYNTHROID, LEVOTHROID) 125 MCG tablet Take 125 mcg by mouth daily. 05/23/14   Historical Provider, MD  lisinopril (PRINIVIL,ZESTRIL) 20 MG tablet Take 20 mg by mouth daily. 06/18/14   Historical Provider, MD  LYRICA 75 MG capsule Take 75 mg by mouth daily. 04/26/14   Historical Provider, MD  metFORMIN (GLUCOPHAGE) 500 MG tablet Take 500 mg by mouth 2 (two) times daily.     Historical Provider, MD   predniSONE (DELTASONE) 20 MG tablet Take 3 tablets (60 mg total) by mouth once. 07/05/14   Merryl Hacker, MD  senna-docusate (SENOKOT-S) 8.6-50 MG per tablet Take 1 tablet by mouth daily as needed for moderate constipation.     Historical Provider, MD  simvastatin (ZOCOR) 10 MG tablet Take 10 mg by mouth daily.    Historical Provider, MD  ZETIA 10 MG tablet Take 10 mg by mouth daily. 06/18/14   Historical Provider, MD   BP 123/55  Pulse 61  Temp(Src) 98.8 F (37.1 C) (Oral)  Resp 20  Wt 205 lb (92.987 kg)  SpO2 100% Physical Exam  Nursing note and vitals reviewed. Constitutional: She is oriented to person, place, and time. She appears well-developed and well-nourished. No distress.  HENT:  Head: Normocephalic and atraumatic.  Mouth/Throat: Oropharynx is clear and moist.  Eyes: Conjunctivae are normal.  Neck: Normal range of motion. Neck supple. No spinous process tenderness and no muscular tenderness present.  Cardiovascular: Normal rate, regular rhythm and normal heart sounds.   Pulmonary/Chest: Effort normal and breath sounds normal. No respiratory distress.  Musculoskeletal: She exhibits no edema.  TTP lower cervical spine and paraspinal muscles. TTP across lower back, winces with any palpation, light touch or stronger palpation.  Neurological: She is alert and oriented to person, place, and time. She has normal strength.  Strength lower extremities 5/5 and equal bilateral. Sensation intact. Gait not assessed on initial exam.  Skin: Skin is warm and dry. No rash noted. She is not diaphoretic.  Psychiatric: She has a normal mood and affect. Her behavior is normal.    ED Course  Procedures (including critical care time) Labs Review Labs Reviewed - No data to display  Imaging Review Dg Cervical Spine Complete  07/20/2014   CLINICAL DATA:  Pain post trauma  EXAM: CERVICAL SPINE  4+ VIEWS  COMPARISON:  April 14, 2013  FINDINGS: Frontal, lateral, open-mouth odontoid, and  bilateral oblique views were obtained. Patient is status post anterior fusion from C4-C7. There are bony plugs at C4-5, C5-6, and C6-7. The screw and plate fixation device and bony plug devices appear intact. There is no fracture or spondylolisthesis. Prevertebral soft tissues and predental space regions are normal. There is no appreciable disc space narrowing. There is no appreciable exit foraminal narrowing on the oblique views.  IMPRESSION: Multilevel postoperative change.  No fracture or spondylolisthesis.   Electronically Signed   By: Lowella Grip M.D.   On: 07/20/2014 13:05   Dg Lumbar Spine Complete  07/20/2014   CLINICAL DATA:  Tripped over dog this morning. Lower back pain. History of lumbar spine surgery.  EXAM: LUMBAR SPINE - COMPLETE 4+ VIEW  COMPARISON:  04/18/2010 MRI  FINDINGS: There is transitional anatomy at L5. Degenerative changes are identified at L5-S1 with significant disc height loss at this  level. There is no evidence for acute fracture or dislocation. No suspicious lytic or blastic lesion identified.  IMPRESSION: No evidence for acute  abnormality.   Electronically Signed   By: Shon Hale M.D.   On: 07/20/2014 13:02     EKG Interpretation None      MDM   Final diagnoses:  Fall, initial encounter  Neck strain, initial encounter  Lumbar strain, initial encounter   Pt presenting with neck and back pain after fall. She is well appearing and in NAD. AFVSS. Neurovascularly intact. No red flags concerning patient's back pain. No s/s of central cord compression or cauda equina. Upper and lower extremities are neurovascularly intact and patient is ambulating without difficulty. Gait evaluated after pt received valium and imaging studies returned normal. Tx with muscle relaxer and short course of pain medication. Pt requests something "stronger than vicodin". I do not feel this is appropriate, will give rx for vicodin. F/u with PCP. Stable for d/c. Return precautions given.  Patient states understanding of treatment care plan and is agreeable.   Illene Labrador, PA-C 07/20/14 [place], PA-C 07/20/14 1501

## 2015-01-10 ENCOUNTER — Observation Stay (HOSPITAL_COMMUNITY)
Admission: EM | Admit: 2015-01-10 | Discharge: 2015-01-11 | Disposition: A | Discharge status: Home / Self Care | Payer: Medicare Other | Attending: Internal Medicine | Admitting: Internal Medicine

## 2015-01-10 ENCOUNTER — Emergency Department (HOSPITAL_COMMUNITY): Payer: Medicare Other

## 2015-01-10 ENCOUNTER — Encounter (HOSPITAL_COMMUNITY): Payer: Self-pay

## 2015-01-10 DIAGNOSIS — E119 Type 2 diabetes mellitus without complications: Secondary | ICD-10-CM | POA: Diagnosis present

## 2015-01-10 DIAGNOSIS — R0902 Hypoxemia: Secondary | ICD-10-CM | POA: Diagnosis not present

## 2015-01-10 DIAGNOSIS — R195 Other fecal abnormalities: Secondary | ICD-10-CM | POA: Diagnosis not present

## 2015-01-10 DIAGNOSIS — E079 Disorder of thyroid, unspecified: Secondary | ICD-10-CM | POA: Insufficient documentation

## 2015-01-10 DIAGNOSIS — G8929 Other chronic pain: Secondary | ICD-10-CM | POA: Diagnosis not present

## 2015-01-10 DIAGNOSIS — R1032 Left lower quadrant pain: Secondary | ICD-10-CM | POA: Diagnosis present

## 2015-01-10 DIAGNOSIS — Z792 Long term (current) use of antibiotics: Secondary | ICD-10-CM | POA: Insufficient documentation

## 2015-01-10 DIAGNOSIS — J40 Bronchitis, not specified as acute or chronic: Secondary | ICD-10-CM | POA: Insufficient documentation

## 2015-01-10 DIAGNOSIS — R1031 Right lower quadrant pain: Secondary | ICD-10-CM | POA: Diagnosis not present

## 2015-01-10 DIAGNOSIS — I10 Essential (primary) hypertension: Secondary | ICD-10-CM | POA: Insufficient documentation

## 2015-01-10 DIAGNOSIS — E039 Hypothyroidism, unspecified: Secondary | ICD-10-CM | POA: Diagnosis present

## 2015-01-10 DIAGNOSIS — H538 Other visual disturbances: Secondary | ICD-10-CM | POA: Insufficient documentation

## 2015-01-10 DIAGNOSIS — Z720 Tobacco use: Secondary | ICD-10-CM | POA: Diagnosis not present

## 2015-01-10 DIAGNOSIS — R1084 Generalized abdominal pain: Secondary | ICD-10-CM | POA: Diagnosis not present

## 2015-01-10 DIAGNOSIS — R0981 Nasal congestion: Secondary | ICD-10-CM | POA: Diagnosis not present

## 2015-01-10 DIAGNOSIS — R509 Fever, unspecified: Secondary | ICD-10-CM | POA: Diagnosis not present

## 2015-01-10 DIAGNOSIS — O26899 Other specified pregnancy related conditions, unspecified trimester: Secondary | ICD-10-CM

## 2015-01-10 DIAGNOSIS — R05 Cough: Secondary | ICD-10-CM | POA: Insufficient documentation

## 2015-01-10 DIAGNOSIS — R102 Pelvic and perineal pain: Principal | ICD-10-CM | POA: Insufficient documentation

## 2015-01-10 DIAGNOSIS — R51 Headache: Secondary | ICD-10-CM

## 2015-01-10 DIAGNOSIS — Z79899 Other long term (current) drug therapy: Secondary | ICD-10-CM | POA: Insufficient documentation

## 2015-01-10 DIAGNOSIS — Z3202 Encounter for pregnancy test, result negative: Secondary | ICD-10-CM | POA: Diagnosis not present

## 2015-01-10 DIAGNOSIS — I129 Hypertensive chronic kidney disease with stage 1 through stage 4 chronic kidney disease, or unspecified chronic kidney disease: Secondary | ICD-10-CM | POA: Diagnosis present

## 2015-01-10 DIAGNOSIS — R112 Nausea with vomiting, unspecified: Secondary | ICD-10-CM | POA: Insufficient documentation

## 2015-01-10 DIAGNOSIS — K5900 Constipation, unspecified: Secondary | ICD-10-CM | POA: Insufficient documentation

## 2015-01-10 DIAGNOSIS — R079 Chest pain, unspecified: Secondary | ICD-10-CM | POA: Diagnosis not present

## 2015-01-10 DIAGNOSIS — R519 Headache, unspecified: Secondary | ICD-10-CM | POA: Diagnosis present

## 2015-01-10 DIAGNOSIS — R109 Unspecified abdominal pain: Secondary | ICD-10-CM | POA: Diagnosis present

## 2015-01-10 HISTORY — DX: Disorder of thyroid, unspecified: E07.9

## 2015-01-10 LAB — URINALYSIS, ROUTINE W REFLEX MICROSCOPIC
Bilirubin Urine: NEGATIVE
Glucose, UA: NEGATIVE mg/dL
Hgb urine dipstick: NEGATIVE
Ketones, ur: NEGATIVE mg/dL
Leukocytes, UA: NEGATIVE
Nitrite: NEGATIVE
Protein, ur: 100 mg/dL — AB
Specific Gravity, Urine: 1.024 (ref 1.005–1.030)
Urobilinogen, UA: 0.2 mg/dL (ref 0.0–1.0)
pH: 5.5 (ref 5.0–8.0)

## 2015-01-10 LAB — COMPREHENSIVE METABOLIC PANEL
ALT: 13 U/L (ref 0–35)
AST: 19 U/L (ref 0–37)
Albumin: 3.4 g/dL — ABNORMAL LOW (ref 3.5–5.2)
Alkaline Phosphatase: 63 U/L (ref 39–117)
Anion gap: 7 (ref 5–15)
BUN: 8 mg/dL (ref 6–23)
CO2: 27 mmol/L (ref 19–32)
Calcium: 9.7 mg/dL (ref 8.4–10.5)
Chloride: 106 mmol/L (ref 96–112)
Creatinine, Ser: 1.09 mg/dL (ref 0.50–1.10)
GFR calc Af Amer: 65 mL/min — ABNORMAL LOW (ref >90)
GFR calc non Af Amer: 56 mL/min — ABNORMAL LOW (ref >90)
Glucose, Bld: 104 mg/dL — ABNORMAL HIGH (ref 70–99)
Potassium: 4 mmol/L (ref 3.5–5.1)
Sodium: 140 mmol/L (ref 135–145)
Total Bilirubin: 0.9 mg/dL (ref 0.3–1.2)
Total Protein: 6.7 g/dL (ref 6.0–8.3)

## 2015-01-10 LAB — WET PREP, GENITAL
Clue Cells Wet Prep HPF POC: NONE SEEN
Trich, Wet Prep: NONE SEEN

## 2015-01-10 LAB — CBC
HCT: 35.5 % — ABNORMAL LOW (ref 36.0–46.0)
Hemoglobin: 12.3 g/dL (ref 12.0–15.0)
MCH: 31.1 pg (ref 26.0–34.0)
MCHC: 34.6 g/dL (ref 30.0–36.0)
MCV: 89.9 fL (ref 78.0–100.0)
Platelets: 235 10*3/uL (ref 150–400)
RBC: 3.95 MIL/uL (ref 3.87–5.11)
RDW: 12.5 % (ref 11.5–15.5)
WBC: 5.6 10*3/uL (ref 4.0–10.5)

## 2015-01-10 LAB — CBC WITH DIFFERENTIAL/PLATELET
Basophils Absolute: 0 10*3/uL (ref 0.0–0.1)
Basophils Relative: 0 % (ref 0–1)
Eosinophils Absolute: 0.1 10*3/uL (ref 0.0–0.7)
Eosinophils Relative: 2 % (ref 0–5)
HCT: 40.6 % (ref 36.0–46.0)
Hemoglobin: 13.6 g/dL (ref 12.0–15.0)
Lymphocytes Relative: 39 % (ref 12–46)
Lymphs Abs: 2.2 10*3/uL (ref 0.7–4.0)
MCH: 30.7 pg (ref 26.0–34.0)
MCHC: 33.5 g/dL (ref 30.0–36.0)
MCV: 91.6 fL (ref 78.0–100.0)
Monocytes Absolute: 0.6 10*3/uL (ref 0.1–1.0)
Monocytes Relative: 11 % (ref 3–12)
Neutro Abs: 2.6 10*3/uL (ref 1.7–7.7)
Neutrophils Relative %: 48 % (ref 43–77)
Platelets: 256 10*3/uL (ref 150–400)
RBC: 4.43 MIL/uL (ref 3.87–5.11)
RDW: 12.8 % (ref 11.5–15.5)
WBC: 5.5 10*3/uL (ref 4.0–10.5)

## 2015-01-10 LAB — URINE MICROSCOPIC-ADD ON

## 2015-01-10 LAB — POC URINE PREG, ED: Preg Test, Ur: NEGATIVE

## 2015-01-10 LAB — GLUCOSE, CAPILLARY
Glucose-Capillary: 122 mg/dL — ABNORMAL HIGH (ref 70–99)
Glucose-Capillary: 132 mg/dL — ABNORMAL HIGH (ref 70–99)

## 2015-01-10 LAB — D-DIMER, QUANTITATIVE (NOT AT ARMC): D-Dimer, Quant: 0.47 ug/mL-FEU (ref 0.00–0.48)

## 2015-01-10 LAB — I-STAT TROPONIN, ED: Troponin i, poc: 0 ng/mL (ref 0.00–0.08)

## 2015-01-10 LAB — LIPASE, BLOOD: Lipase: 23 U/L (ref 11–59)

## 2015-01-10 LAB — CBG MONITORING, ED: Glucose-Capillary: 88 mg/dL (ref 70–99)

## 2015-01-10 LAB — POC OCCULT BLOOD, ED: Fecal Occult Bld: NEGATIVE

## 2015-01-10 MED ORDER — PNEUMOCOCCAL VAC POLYVALENT 25 MCG/0.5ML IJ INJ
0.5000 mL | INJECTION | INTRAMUSCULAR | Status: DC
  Filled 2015-01-10: qty 0.5

## 2015-01-10 MED ORDER — EZETIMIBE 10 MG PO TABS
10.0000 mg | ORAL_TABLET | Freq: Every day | ORAL | Status: DC
  Administered 2015-01-10 – 2015-01-11 (×2): 10 mg via ORAL
  Filled 2015-01-10 (×2): qty 1

## 2015-01-10 MED ORDER — SIMVASTATIN 20 MG PO TABS
20.0000 mg | ORAL_TABLET | Freq: Every day | ORAL | Status: DC
  Filled 2015-01-10: qty 1

## 2015-01-10 MED ORDER — ZOLPIDEM TARTRATE 5 MG PO TABS
5.0000 mg | ORAL_TABLET | Freq: Every evening | ORAL | Status: DC | PRN
  Administered 2015-01-10: 5 mg via ORAL
  Filled 2015-01-10: qty 1

## 2015-01-10 MED ORDER — PREGABALIN 50 MG PO CAPS
75.0000 mg | ORAL_CAPSULE | Freq: Every day | ORAL | Status: DC
  Administered 2015-01-10 – 2015-01-11 (×2): 75 mg via ORAL
  Filled 2015-01-10 (×4): qty 1

## 2015-01-10 MED ORDER — IOHEXOL 300 MG/ML  SOLN
80.0000 mL | Freq: Once | INTRAMUSCULAR | Status: AC | PRN
  Administered 2015-01-10: 80 mL via INTRAVENOUS

## 2015-01-10 MED ORDER — IOHEXOL 300 MG/ML  SOLN
25.0000 mL | Freq: Once | INTRAMUSCULAR | Status: AC | PRN
  Administered 2015-01-10: 25 mL via ORAL

## 2015-01-10 MED ORDER — GI COCKTAIL ~~LOC~~
30.0000 mL | Freq: Four times a day (QID) | ORAL | Status: DC | PRN
  Administered 2015-01-11: 30 mL via ORAL
  Filled 2015-01-10: qty 30

## 2015-01-10 MED ORDER — LEVOTHYROXINE SODIUM 25 MCG PO TABS
125.0000 ug | ORAL_TABLET | Freq: Every day | ORAL | Status: DC
  Administered 2015-01-11: 125 ug via ORAL
  Filled 2015-01-10 (×2): qty 1

## 2015-01-10 MED ORDER — LISINOPRIL 20 MG PO TABS
20.0000 mg | ORAL_TABLET | Freq: Every day | ORAL | Status: DC
  Administered 2015-01-10 – 2015-01-11 (×2): 20 mg via ORAL
  Filled 2015-01-10 (×2): qty 1

## 2015-01-10 MED ORDER — INSULIN ASPART 100 UNIT/ML ~~LOC~~ SOLN: 0.0000 [IU] | Freq: Three times a day (TID) | SUBCUTANEOUS | Status: DC

## 2015-01-10 MED ORDER — ONDANSETRON HCL 4 MG/2ML IJ SOLN: 4.0000 mg | Freq: Four times a day (QID) | INTRAMUSCULAR | Status: DC | PRN

## 2015-01-10 MED ORDER — ENOXAPARIN SODIUM 40 MG/0.4ML ~~LOC~~ SOLN
40.0000 mg | SUBCUTANEOUS | Status: DC
  Administered 2015-01-10: 40 mg via SUBCUTANEOUS
  Filled 2015-01-10: qty 0.4

## 2015-01-10 MED ORDER — ACETAMINOPHEN 325 MG PO TABS
650.0000 mg | ORAL_TABLET | ORAL | Status: DC | PRN
  Administered 2015-01-10 – 2015-01-11 (×2): 650 mg via ORAL
  Filled 2015-01-10 (×2): qty 2

## 2015-01-10 MED ORDER — SODIUM CHLORIDE 0.9 % IV BOLUS (SEPSIS)
1000.0000 mL | Freq: Once | INTRAVENOUS | Status: AC
  Administered 2015-01-10: 1000 mL via INTRAVENOUS

## 2015-01-10 NOTE — ED Notes (Signed)
Pt transported to CT scan.

## 2015-01-10 NOTE — H&P (Signed)
History and Physical  Margaret Webster GMW:[id number] DOB: 1961-06-20 DOA: 01/10/2015  Referring physician: Jamse Mead, ER PA PCP: Patient is not exactly sure what her doctors last name is-she thinks it is Dr. Elpidio Eric. We'll need to confirm with her pharmacy when it is opened tomorrow  Chief Complaint: Abdominal pain and headache  HPI: Margaret Webster is a 54 y.o. female  With past history of diabetes and hypothyroidism who came to the emergency room of her having intermittent headaches and vague abdominal pain described as random sharp pain 7 going on intermittently for the past 4 weeks. In the emergency room, CT scan of the head and abdomen and pelvis were unremarkable. Patient was also complaining of some chest pain but this again was more vague and described as more radiating from her belly. Occasional sharp and stabbing, radiating 1 Center for chest without going elsewhere. No associated shortness of breath. In the emergency room however they did note that she had some episodes of mild hypoxia. Patient suffers noted some cough with clear white sputum in the past few weeks. No fever. Chest x-ray unrevealing. D-dimer within normal limits. EKG noted some ST depressions in the inferior leads, however this was similar to previous labs. Patient had an overnight for observation.   Review of Systems:  Patient seen after arrival to floor. Pt complains of intermittent headache. Abdominal pain and chest pain feeling better. No shortness of breath. Complains of associated blurry vision with the headaches. Denies any dysphagia, palpitations, shortness of breath, wheeze, hematuria, dysuria, constipation, diarrhea, focal extremity numbness weakness or pain   Review of systems are otherwise negative  Past Medical History  Diagnosis Date  . Diabetes mellitus   . Chronic back pain   . Hypertension   . Bronchitis   . Thyroid disease    Past Surgical History  Procedure Laterality Date  . Back surgery   1993  . Oophorectomy    . Cervical spine surgery Right 11/2012   Social History:  reports that she has been smoking.  She has never used smokeless tobacco. She reports that she drinks alcohol. She reports that she uses illicit drugs (Marijuana). Patient lives at home by herself & is able to participate in activities of daily living with out any assistance  Allergies  Allergen Reactions  . Sulfa Antibiotics Other (See Comments)    Hard to breathe  . Influenza Vaccines Hives  . Lipitor [Atorvastatin Calcium] Other (See Comments)    Muscle ache   . Naproxen Nausea And Vomiting    Family History  Problem Relation Age of Onset  . Cystic fibrosis Sister   . Cancer Sister     ?  Marland Kitchen Heart disease Maternal Grandmother   . Clotting disorder Maternal Grandfather   . Diabetes Mother     Barbaraann Rondo and grandmother with heart attacks  Prior to Admission medications   Medication Sig Start Date End Date Taking? Authorizing Provider  levothyroxine (SYNTHROID, LEVOTHROID) 125 MCG tablet Take 125 mcg by mouth daily. 05/23/14  Yes Historical Provider, MD  lisinopril (PRINIVIL,ZESTRIL) 20 MG tablet Take 20 mg by mouth daily. 06/18/14  Yes Historical Provider, MD  LYRICA 75 MG capsule Take 75 mg by mouth daily. 04/26/14  Yes Historical Provider, MD  metFORMIN (GLUCOPHAGE-XR) 500 MG 24 hr tablet Take 500 mg by mouth daily. 12/04/14  Yes Historical Provider, MD  simvastatin (ZOCOR) 10 MG tablet Take 20 mg by mouth daily.    Yes Historical Provider, MD  ZETIA 10 MG  tablet Take 10 mg by mouth daily. 06/18/14  Yes Historical Provider, MD    Physical Exam: BP 151/76 mmHg  Pulse 57  Temp(Src) 98.4 F (36.9 C) (Oral)  Resp 14  SpO2 97%  General:  Alert and oriented 3, no acute distress Eyes: Sclera nonicteric, extraocular movements are intact ENT: Normocephalic, atraumatic, mucous membranes are moist Neck: Supple, no JVD Cardiovascular: Regular rate and rhythm, X5-Q0, soft 2/6 systolic ejection  murmur Respiratory: Clear to auscultation bilaterally Abdomen: Soft, nontender, nondistended, positive bowel sounds Skin: No skin breaks, tears or lesions Musculoskeletal: No clubbing or cyanosis or edema Psychiatric: Patient is appropriate, no evidence of psychoses Neurologic: No focal deficits           Labs on Admission:  Basic Metabolic Panel:  Recent Labs Lab 01/10/15 1134  NA 140  K 4.0  CL 106  CO2 27  GLUCOSE 104*  BUN 8  CREATININE 1.09  CALCIUM 9.7   Liver Function Tests:  Recent Labs Lab 01/10/15 1134  AST 19  ALT 13  ALKPHOS 63  BILITOT 0.9  PROT 6.7  ALBUMIN 3.4*    Recent Labs Lab 01/10/15 1134  LIPASE 23   No results for input(s): AMMONIA in the last 168 hours. CBC:  Recent Labs Lab 01/10/15 1134  WBC 5.5  NEUTROABS 2.6  HGB 13.6  HCT 40.6  MCV 91.6  PLT 256   Cardiac Enzymes: No results for input(s): CKTOTAL, CKMB, CKMBINDEX, TROPONINI in the last 168 hours.  BNP (last 3 results) No results for input(s): BNP in the last 8760 hours.  ProBNP (last 3 results) No results for input(s): PROBNP in the last 8760 hours.  CBG:  Recent Labs Lab 01/10/15 1236 01/10/15 1837  GLUCAP 88 122*    Radiological Exams on Admission: Dg Chest 2 View  01/10/2015   CLINICAL DATA:  54 year old female with abdominal pain which is increasing. Initial encounter.  EXAM: CHEST  2 VIEW  COMPARISON:  07/05/2014 and earlier.  FINDINGS: Normal lung volumes. Normal cardiac size and mediastinal contours. No pneumothorax, pulmonary edema, pleural effusion or confluent pulmonary opacity. No pneumoperitoneum. Negative visible bowel gas pattern. Chronic cervical ACDF hardware. No acute osseous abnormality identified.  IMPRESSION: No acute cardiopulmonary abnormality.   Electronically Signed   By: Genevie Ann M.D.   On: 01/10/2015 13:16   Ct Head Wo Contrast  01/10/2015   CLINICAL DATA:  Progressive headache for approximately 1 month. Intermittent blurred vision.   EXAM: CT HEAD WITHOUT CONTRAST  TECHNIQUE: Contiguous axial images were obtained from the base of the skull through the vertex without intravenous contrast.  COMPARISON:  None.  FINDINGS: The ventricles are normal in size and configuration. There is no mass, hemorrhage, extra-axial fluid collection, or midline shift. Gray-white compartments appear normal. No acute infarct apparent. Bony calvarium appears intact. The mastoid air cells clear.  IMPRESSION: Study within normal limits.   Electronically Signed   By: Lowella Grip III M.D.   On: 01/10/2015 13:56   US Transvaginal Non-ob  01/10/2015   CLINICAL DATA:  Pelvic pain affecting pregnancy. Pelvic pain for 4 weeks.  EXAM: TRANSABDOMINAL AND TRANSVAGINAL ULTRASOUND OF PELVIS  DOPPLER ULTRASOUND OF OVARIES  TECHNIQUE: Both transabdominal and transvaginal ultrasound examinations of the pelvis were performed. Transabdominal technique was performed for global imaging of the pelvis including uterus, ovaries, adnexal regions, and pelvic cul-de-sac.  It was necessary to proceed with endovaginal exam following the transabdominal exam to visualize the ovaries and endometrium. Color and duplex Doppler  ultrasound was utilized to evaluate blood flow to the ovaries.  COMPARISON:  CT of the abdomen and pelvis from the same day. Pelvic ultrasound 11/22/2013.  FINDINGS: Uterus  Measurements: 4.6 x 1.8 x 3.9 cm. A 12 mm anterior submucosal fibroid is stable to slightly increased in size. No other fibroids or other mass visualized.  Endometrium  Thickness: 12 mm.  No focal abnormality visualized.  Right ovary  Measurements: 3.2 x 1.4 x 3.4 cm, within normal limits. A 10 mm follicle or paraovarian cyst is noted.  Left ovary  Surgically absent.  Pulsed Doppler evaluation of both ovaries demonstrates normal low-resistance arterial and venous waveforms in the right ovary.  Other findings  No free fluid.  IMPRESSION: 1. Stable to slight increase in size of a anterior sub mucosal  fibroid. 2. The right ovary is within normal limits. A dominant follicle or paraovarian cyst measures 10 mm maximally.   Electronically Signed   By: San Morelle M.D.   On: 01/10/2015 15:51   US Pelvis Complete  01/10/2015   CLINICAL DATA:  Pelvic pain affecting pregnancy. Pelvic pain for 4 weeks.  EXAM: TRANSABDOMINAL AND TRANSVAGINAL ULTRASOUND OF PELVIS  DOPPLER ULTRASOUND OF OVARIES  TECHNIQUE: Both transabdominal and transvaginal ultrasound examinations of the pelvis were performed. Transabdominal technique was performed for global imaging of the pelvis including uterus, ovaries, adnexal regions, and pelvic cul-de-sac.  It was necessary to proceed with endovaginal exam following the transabdominal exam to visualize the ovaries and endometrium. Color and duplex Doppler ultrasound was utilized to evaluate blood flow to the ovaries.  COMPARISON:  CT of the abdomen and pelvis from the same day. Pelvic ultrasound 11/22/2013.  FINDINGS: Uterus  Measurements: 4.6 x 1.8 x 3.9 cm. A 12 mm anterior submucosal fibroid is stable to slightly increased in size. No other fibroids or other mass visualized.  Endometrium  Thickness: 12 mm.  No focal abnormality visualized.  Right ovary  Measurements: 3.2 x 1.4 x 3.4 cm, within normal limits. A 10 mm follicle or paraovarian cyst is noted.  Left ovary  Surgically absent.  Pulsed Doppler evaluation of both ovaries demonstrates normal low-resistance arterial and venous waveforms in the right ovary.  Other findings  No free fluid.  IMPRESSION: 1. Stable to slight increase in size of a anterior sub mucosal fibroid. 2. The right ovary is within normal limits. A dominant follicle or paraovarian cyst measures 10 mm maximally.   Electronically Signed   By: San Morelle M.D.   On: 01/10/2015 15:51   Ct Abdomen Pelvis W Contrast  01/10/2015   CLINICAL DATA:  54 year old female with a history of abdominal pain. For week duration.  EXAM: CT ABDOMEN AND PELVIS WITH  CONTRAST  TECHNIQUE: Multidetector CT imaging of the abdomen and pelvis was performed using the standard protocol following bolus administration of intravenous contrast.  CONTRAST:  58mL OMNIPAQUE IOHEXOL 300 MG/ML  SOLN  COMPARISON:  Abdominal plain film 07/20/2014, MR lumbar spine 04/18/2010  FINDINGS: Lower chest:  Unremarkable appearance of the superficial soft tissue the chest wall.  Unremarkable appearance of the heart, with no pericardial fluid/ thickening.  Small hiatal hernia.  No pleural effusion or confluent airspace disease.  Abdomen/pelvis:  Focal fatty infiltration of the liver adjacent to the falciform ligament. Unremarkable appearance the spleen.  Unremarkable appearance the bilateral adrenal glands.  Unremarkable appearance of the pancreas and gallbladder. No intrahepatic or extrahepatic biliary ductal dilatation.  Enteric contrast within the distal small bowel and proximal colon. No evidence of  abnormally distended small bowel or colon. Normal appendix.  No intraperitoneal free air. No significant free fluid. No abnormally enlarged lymph nodes of the mesenteric. No mesenteric inflammatory changes. Index lymph node at the celiac nodal station measures 8 mm in greatest diameter.  No evidence of left or right-sided hydronephrosis. No nephrolithiasis.  Unremarkable appearance of the course of the bilateral ureters.  Decompressed urinary bladder. The dome of the urinary bladder is peaked towards the midline abdominal wall musculature, extending towards the umbilicus. This may represent a partially persistent urachus.  Unremarkable appearance of the uterus and adnexa.  No evidence of aneurysm or dissection flap. Scattered atherosclerotic calcifications, predominantly distal aorta and the a iliofemoral systems. Calcifications of the pelvic vasculature.  Mesenteric vessels are patent.  Unremarkable appearance of the venous system.  No displaced fracture.  Degenerative changes of L5-S1 with nearly  bone-on-bone articulation. Facet disease of the L4-L5 level. Trace retrolisthesis.  No aggressive bone lesions identified.  IMPRESSION: No acute CT finding to account for the patient's symptoms of abdominal pain.  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 No significant inflammatory changes. Referral for urologic evaluation may be warranted, as urachal remnants are known to rarely develop carcinoma.  Atherosclerosis.  Small hiatal hernia.  Signed,  Dulcy Fanny. Earleen Newport, DO  [facility]  [facility]   Electronically Signed   By: Corrie Mckusick D.O.   On: 01/10/2015 14:10   [place] Art/ven Flow Abd Pelv Doppler  01/10/2015   CLINICAL DATA:  Pelvic pain affecting pregnancy. Pelvic pain for 4 weeks.  EXAM: TRANSABDOMINAL AND TRANSVAGINAL ULTRASOUND OF PELVIS  DOPPLER ULTRASOUND OF OVARIES  TECHNIQUE: Both transabdominal and transvaginal ultrasound examinations of the pelvis were performed. Transabdominal technique was performed for global imaging of the pelvis including uterus, ovaries, adnexal regions, and pelvic cul-de-sac.  It was necessary to proceed with endovaginal exam following the transabdominal exam to visualize the ovaries and endometrium. Color and duplex Doppler ultrasound was utilized to evaluate blood flow to the ovaries.  COMPARISON:  CT of the abdomen and pelvis from the same day. Pelvic ultrasound 11/22/2013.  FINDINGS: Uterus  Measurements: 4.6 x 1.8 x 3.9 cm. A 12 mm anterior submucosal fibroid is stable to slightly increased in size. No other fibroids or other mass visualized.  Endometrium  Thickness: 12 mm.  No focal abnormality visualized.  Right ovary  Measurements: 3.2 x 1.4 x 3.4 cm, within normal limits. A 10 mm follicle or paraovarian cyst is noted.  Left ovary  Surgically absent.  Pulsed Doppler evaluation of both ovaries demonstrates normal low-resistance  arterial and venous waveforms in the right ovary.  Other findings  No free fluid.  IMPRESSION: 1. Stable to slight increase in size of a anterior sub mucosal fibroid. 2. The right ovary is within normal limits. A dominant follicle or paraovarian cyst measures 10 mm maximally.   Electronically Signed   By: San Morelle M.D.   On: 01/10/2015 15:51    EKG: Independently reviewed. Abnormal R-wave progression, inferior ST depressions-however this was previously seen on EKG in June 2011  Assessment/Plan Present on Admission:  . Chest pain: Limited suspicion for cardiac in nature. Heart score of 4. Check TSH. Cycle enzymes. If negative, home tomorrow  . Diabetes mellitus: Check A1c, sliding scale  . Hypertension: Continue ACE inhibitor  . Thyroid disease: Check TSH  . Headache: Suspect migraines, Tylenol  . Abdominal pain: Unremarkable  CT. Vague in nature. Question marked hypoxia: Chest x-ray unrevealing. We'll check laboratory pulse ox morning. Patient comfortable, does complain of a mild productive cough. We'll check flu titer Tobacco abuse: Counseled, declines nicotine patch Consultants: None  Code Status: Full code  Family Communication: No family  Disposition Plan: Home likely tomorrow if enzymes negative  Time spent: 35 minutes  [facility] Pager [phone]

## 2015-01-10 NOTE — ED Notes (Signed)
PA Marissa at bedside. 

## 2015-01-10 NOTE — ED Notes (Signed)
Phlebotomy at bedside.

## 2015-01-10 NOTE — ED Provider Notes (Signed)
CSN: [id number]     Arrival date & time 01/10/15  1033 History   First MD Initiated Contact with Patient 01/10/15 1038     Chief Complaint  Patient presents with  . Abdominal Pain     (Consider location/radiation/quality/duration/timing/severity/associated sxs/prior Treatment) The history is provided by the patient. No language interpreter was used.  Margaret Webster is a 54 year old female with past medical history of diabetes, chronic back pain, hypertension, bronchitis, thyroid disease presenting to the ED with stomach pain and headache that has been ongoing for approximately 4 weeks. Patient reports the abdominal pain is localized to lower portion of her abdomen and pelvis described as a sharp shooting pain that was intermittent for the past 3 weeks, but has now become constant over the past week. Reported that at times the stomach pain radiates towards her back, but reports it is mainly localized to the lower portion of her abdomen. Stated that she has been feeling constipated-reported that she took a laxative within normal bowel movement on Sunday. Stated that she did experience melena couple of days ago, but reported that has now resolved. Reported that she's been having chest discomfort described as a gaseous sensation. Reported that she's been having decreased appetite. Stated that she's been having nausea and vomiting with at least 2 episodes yesterday mainly of phlegm-NB/NB. Reported that she was able to hold down small amount of chicken today as well as fluids. Reported that the headache started off as a low-grade headache and progressive gotten worse over the past 2 weeks. Reported that the headache comes and goes-stated that she's been using Benadryl without relief. Reports that it hurts all over her forehead and has radiated to her ears with blurred vision intermittently bilaterally. Reported that she's been having sinus congestion with a runny nose of thick clear sputum. Reported that she  is also been having some sinus pressure as well. Stated that there are sick contacts at work. Reported that she's been having intermittent chills. Denied fever, hematochezia, dysuria, hematuria, diarrhea, travels, fainting, dizziness, vaginal bleeding, vaginal discharge, head injury, blood thinners, shortness of breath, difficulty breathing, hemoptysis, worse headache of life, thunderclap onset. PCP Dr. Harmon Pier  Past Medical History  Diagnosis Date  . Diabetes mellitus   . Chronic back pain   . Hypertension   . Bronchitis   . Thyroid disease    Past Surgical History  Procedure Laterality Date  . Back surgery  1993  . Oophorectomy    . Cervical spine surgery Right 11/2012   Family History  Problem Relation Age of Onset  . Cystic fibrosis Sister   . Cancer Sister     ?  Marland Kitchen Heart disease Maternal Grandmother   . Clotting disorder Maternal Grandfather   . Diabetes Mother    History  Substance Use Topics  . Smoking status: Current Every Day Smoker -- 0.50 packs/day  . Smokeless tobacco: Never Used  . Alcohol Use: Yes     Comment: rarely   OB History    No data available     Review of Systems  Constitutional: Positive for fever (Subjective). Negative for chills.  HENT: Positive for congestion and sinus pressure.   Eyes: Positive for visual disturbance. Negative for photophobia.  Respiratory: Positive for cough. Negative for chest tightness and shortness of breath.   Cardiovascular: Positive for chest pain.  Gastrointestinal: Positive for nausea, vomiting, abdominal pain, constipation and blood in stool. Negative for diarrhea and anal bleeding.  Genitourinary: Positive for pelvic pain. Negative for  dysuria, hematuria, vaginal bleeding, vaginal discharge and vaginal pain.  Musculoskeletal: Negative for back pain, neck pain and neck stiffness.  Neurological: Positive for headaches. Negative for dizziness, weakness and numbness.      Allergies  Sulfa antibiotics; Influenza  vaccines; Lipitor; and Naproxen  Home Medications   Prior to Admission medications   Medication Sig Start Date End Date Taking? Authorizing Provider  levothyroxine (SYNTHROID, LEVOTHROID) 125 MCG tablet Take 125 mcg by mouth daily. 05/23/14  Yes Historical Provider, MD  lisinopril (PRINIVIL,ZESTRIL) 20 MG tablet Take 20 mg by mouth daily. 06/18/14  Yes Historical Provider, MD  LYRICA 75 MG capsule Take 75 mg by mouth daily. 04/26/14  Yes Historical Provider, MD  metFORMIN (GLUCOPHAGE-XR) 500 MG 24 hr tablet Take 500 mg by mouth daily. 12/04/14  Yes Historical Provider, MD  simvastatin (ZOCOR) 10 MG tablet Take 20 mg by mouth daily.    Yes Historical Provider, MD  ZETIA 10 MG tablet Take 10 mg by mouth daily. 06/18/14  Yes Historical Provider, MD  albuterol (PROVENTIL HFA;VENTOLIN HFA) 108 (90 BASE) MCG/ACT inhaler Inhale 2 puffs into the lungs every 6 (six) hours as needed for wheezing or shortness of breath.    Historical Provider, MD  azithromycin (ZITHROMAX) 250 MG tablet Take 250 mg (1 tablet) by mouth daily for the next four days. Patient not taking: Reported on 01/10/2015 07/05/14   Merryl Hacker, MD  clindamycin (CLEOCIN) 300 MG capsule Take 1 capsule (300 mg total) by mouth 3 (three) times daily. Patient not taking: Reported on 01/10/2015 06/21/14   Liam Graham, PA-C  diazepam (VALIUM) 5 MG tablet Take 1 tablet (5 mg total) by mouth 2 (two) times daily. Patient not taking: Reported on 01/10/2015 07/20/14   Carman Ching, PA-C  HYDROcodone-acetaminophen (NORCO) 5-325 MG per tablet Take 1 tablet by mouth every 6 (six) hours as needed for moderate pain. Patient not taking: Reported on 01/10/2015 06/21/14   Liam Graham, PA-C  HYDROcodone-acetaminophen (NORCO/VICODIN) 5-325 MG per tablet Take 1-2 tablets by mouth every 4 (four) hours as needed. Patient not taking: Reported on 01/10/2015 07/20/14   Carman Ching, PA-C  predniSONE (DELTASONE) 20 MG tablet Take 3 tablets (60 mg total) by mouth  once. Patient not taking: Reported on 01/10/2015 07/05/14   Merryl Hacker, MD   BP 151/82 mmHg  Pulse 64  Temp(Src) 98.4 F (36.9 C) (Oral)  Resp 18  SpO2 96% Physical Exam  Constitutional: She is oriented to person, place, and time. She appears well-developed and well-nourished. No distress.  HENT:  Head: Normocephalic and atraumatic.  Mouth/Throat: Oropharynx is clear and moist. No oropharyngeal exudate.  Eyes: Conjunctivae and EOM are normal. Pupils are equal, round, and reactive to light. Right eye exhibits no discharge. Left eye exhibits no discharge.  Negative nystagmus  Neck: Normal range of motion. Neck supple. No tracheal deviation present.  Negative neck stiffness Negative nuchal rigidity  Negative cervical lymphadenopathy  Negative meningeal signs   Cardiovascular: Normal rate, regular rhythm and normal heart sounds.  Exam reveals no friction rub.   No murmur heard. Pulses:      Radial pulses are 2+ on the right side, and 2+ on the left side.       Dorsalis pedis pulses are 2+ on the right side, and 2+ on the left side.  Pulmonary/Chest: Effort normal and breath sounds normal. No respiratory distress. She has no wheezes. She has no rales. She exhibits tenderness (Mild discomfort upon palpation to  the center and left side of the chest - appears muscular in nature.).  Abdominal: Soft. Bowel sounds are normal. She exhibits no distension. There is tenderness in the right lower quadrant, suprapubic area and left lower quadrant. There is no rebound and no guarding.  Obese   Genitourinary:  Pelvic exam: Negative swelling, erythema, inflammation, lesions, sores, deformities, areas of fluctuance or induration identified to the external genitalia. Negative blood in the vaginal fold. Thick white discharge identified with negative odor. Cervix identified with negative friability, deformities. Negative CMT or left adnexal tenderness. Right adnexal tenderness identified. Exam chaperoned  with nurse, Hildred Alamin.  Rectal exam: Negative bright red blood per rectum. Negative hemorrhoids identified. Strong sphincter tone. Negative palpation of masses or polyps. Graffeo stool noted on glove without bright red blood. Exam chaperoned with nurse, Hildred Alamin.  Musculoskeletal: Normal range of motion.  Full ROM to upper and lower extremities without difficulty noted, negative ataxia noted.  Lymphadenopathy:    She has no cervical adenopathy.  Neurological: She is alert and oriented to person, place, and time. No cranial nerve deficit. She exhibits normal muscle tone. Coordination normal.  Cranial nerves III-XII grossly intact Strength 5+/5+ to upper and lower extremities bilaterally with resistance applied, equal distribution noted Equal grip strength bilaterally Negative saddle paresthesias bilaterally Sensation intact Patient is able to bring finger to nose bilaterally without difficulty or ataxia Negative arm drift Fine motor skills intact Patient follows commands well Patient responds to questions appropriately  Skin: Skin is warm and dry. No rash noted. She is not diaphoretic. No erythema.  Psychiatric: She has a normal mood and affect. Her behavior is normal. Thought content normal.  Nursing note and vitals reviewed.   ED Course  Procedures (including critical care time)  Results for orders placed or performed during the hospital encounter of 01/10/15  Wet prep, genital  Result Value Ref Range   Yeast Wet Prep HPF POC FEW (A) NONE SEEN   Trich, Wet Prep NONE SEEN NONE SEEN   Clue Cells Wet Prep HPF POC NONE SEEN NONE SEEN   WBC, Wet Prep HPF POC FEW (A) NONE SEEN  CBC with Differential/Platelet  Result Value Ref Range   WBC 5.5 4.0 - 10.5 K/uL   RBC 4.43 3.87 - 5.11 MIL/uL   Hemoglobin 13.6 12.0 - 15.0 g/dL   HCT 40.6 36.0 - 46.0 %   MCV 91.6 78.0 - 100.0 fL   MCH 30.7 26.0 - 34.0 pg   MCHC 33.5 30.0 - 36.0 g/dL   RDW 12.8 11.5 - 15.5 %   Platelets 256 150 - 400 K/uL    Neutrophils Relative % 48 43 - 77 %   Neutro Abs 2.6 1.7 - 7.7 K/uL   Lymphocytes Relative 39 12 - 46 %   Lymphs Abs 2.2 0.7 - 4.0 K/uL   Monocytes Relative 11 3 - 12 %   Monocytes Absolute 0.6 0.1 - 1.0 K/uL   Eosinophils Relative 2 0 - 5 %   Eosinophils Absolute 0.1 0.0 - 0.7 K/uL   Basophils Relative 0 0 - 1 %   Basophils Absolute 0.0 0.0 - 0.1 K/uL  Comprehensive metabolic panel  Result Value Ref Range   Sodium 140 135 - 145 mmol/L   Potassium 4.0 3.5 - 5.1 mmol/L   Chloride 106 96 - 112 mmol/L   CO2 27 19 - 32 mmol/L   Glucose, Bld 104 (H) 70 - 99 mg/dL   BUN 8 6 - 23 mg/dL   Creatinine,  Ser 1.09 0.50 - 1.10 mg/dL   Calcium 9.7 8.4 - 10.5 mg/dL   Total Protein 6.7 6.0 - 8.3 g/dL   Albumin 3.4 (L) 3.5 - 5.2 g/dL   AST 19 0 - 37 U/L   ALT 13 0 - 35 U/L   Alkaline Phosphatase 63 39 - 117 U/L   Total Bilirubin 0.9 0.3 - 1.2 mg/dL   GFR calc non Af Amer 56 (L) >90 mL/min   GFR calc Af Amer 65 (L) >90 mL/min   Anion gap 7 5 - 15  Lipase, blood  Result Value Ref Range   Lipase 23 11 - 59 U/L  Urinalysis, Routine w reflex microscopic  Result Value Ref Range   Color, Urine AMBER (A) YELLOW   APPearance CLOUDY (A) CLEAR   Specific Gravity, Urine 1.024 1.005 - 1.030   pH 5.5 5.0 - 8.0   Glucose, UA NEGATIVE NEGATIVE mg/dL   Hgb urine dipstick NEGATIVE NEGATIVE   Bilirubin Urine NEGATIVE NEGATIVE   Ketones, ur NEGATIVE NEGATIVE mg/dL   Protein, ur 100 (A) NEGATIVE mg/dL   Urobilinogen, UA 0.2 0.0 - 1.0 mg/dL   Nitrite NEGATIVE NEGATIVE   Leukocytes, UA NEGATIVE NEGATIVE  Urine microscopic-add on  Result Value Ref Range   Squamous Epithelial / LPF MANY (A) RARE   WBC, UA 3-6 <3 WBC/hpf   Bacteria, UA FEW (A) RARE   Casts GRANULAR CAST (A) NEGATIVE   Urine-Other MUCOUS PRESENT   POC urine preg, ED (not at [facility])  Result Value Ref Range   Preg Test, Ur NEGATIVE NEGATIVE  POC occult blood, ED  Result Value Ref Range   Fecal Occult Bld NEGATIVE NEGATIVE  CBG monitoring,  ED  Result Value Ref Range   Glucose-Capillary 88 70 - 99 mg/dL  I-stat troponin, ED  Result Value Ref Range   Troponin i, poc 0.00 0.00 - 0.08 ng/mL   Comment 3            Labs Review Labs Reviewed  WET PREP, GENITAL - Abnormal; Notable for the following:    Yeast Wet Prep HPF POC FEW (*)    WBC, Wet Prep HPF POC FEW (*)    All other components within normal limits  COMPREHENSIVE METABOLIC PANEL - Abnormal; Notable for the following:    Glucose, Bld 104 (*)    Albumin 3.4 (*)    GFR calc non Af Amer 56 (*)    GFR calc Af Amer 65 (*)    All other components within normal limits  URINALYSIS, ROUTINE W REFLEX MICROSCOPIC - Abnormal; Notable for the following:    Color, Urine AMBER (*)    APPearance CLOUDY (*)    Protein, ur 100 (*)    All other components within normal limits  URINE MICROSCOPIC-ADD ON - Abnormal; Notable for the following:    Squamous Epithelial / LPF MANY (*)    Bacteria, UA FEW (*)    Casts GRANULAR CAST (*)    All other components within normal limits  CBC WITH DIFFERENTIAL/PLATELET  LIPASE, BLOOD  D-DIMER, QUANTITATIVE  POC URINE PREG, ED  POC OCCULT BLOOD, ED  CBG MONITORING, ED  I-STAT TROPOININ, ED  GC/CHLAMYDIA PROBE AMP ([place])    Imaging Review Dg Chest 2 View  01/10/2015   CLINICAL DATA:  54 year old female with abdominal pain which is increasing. Initial encounter.  EXAM: CHEST  2 VIEW  COMPARISON:  07/05/2014 and earlier.  FINDINGS: Normal lung volumes. Normal cardiac size and mediastinal contours.  No pneumothorax, pulmonary edema, pleural effusion or confluent pulmonary opacity. No pneumoperitoneum. Negative visible bowel gas pattern. Chronic cervical ACDF hardware. No acute osseous abnormality identified.  IMPRESSION: No acute cardiopulmonary abnormality.   Electronically Signed   By: Genevie Ann M.D.   On: 01/10/2015 13:16   Ct Head Wo Contrast  01/10/2015   CLINICAL DATA:  Progressive headache for approximately 1 month. Intermittent  blurred vision.  EXAM: CT HEAD WITHOUT CONTRAST  TECHNIQUE: Contiguous axial images were obtained from the base of the skull through the vertex without intravenous contrast.  COMPARISON:  None.  FINDINGS: The ventricles are normal in size and configuration. There is no mass, hemorrhage, extra-axial fluid collection, or midline shift. Gray-white compartments appear normal. No acute infarct apparent. Bony calvarium appears intact. The mastoid air cells clear.  IMPRESSION: Study within normal limits.   Electronically Signed   By: Lowella Grip III M.D.   On: 01/10/2015 13:56   US Transvaginal Non-ob  01/10/2015   CLINICAL DATA:  Pelvic pain affecting pregnancy. Pelvic pain for 4 weeks.  EXAM: TRANSABDOMINAL AND TRANSVAGINAL ULTRASOUND OF PELVIS  DOPPLER ULTRASOUND OF OVARIES  TECHNIQUE: Both transabdominal and transvaginal ultrasound examinations of the pelvis were performed. Transabdominal technique was performed for global imaging of the pelvis including uterus, ovaries, adnexal regions, and pelvic cul-de-sac.  It was necessary to proceed with endovaginal exam following the transabdominal exam to visualize the ovaries and endometrium. Color and duplex Doppler ultrasound was utilized to evaluate blood flow to the ovaries.  COMPARISON:  CT of the abdomen and pelvis from the same day. Pelvic ultrasound 11/22/2013.  FINDINGS: Uterus  Measurements: 4.6 x 1.8 x 3.9 cm. A 12 mm anterior submucosal fibroid is stable to slightly increased in size. No other fibroids or other mass visualized.  Endometrium  Thickness: 12 mm.  No focal abnormality visualized.  Right ovary  Measurements: 3.2 x 1.4 x 3.4 cm, within normal limits. A 10 mm follicle or paraovarian cyst is noted.  Left ovary  Surgically absent.  Pulsed Doppler evaluation of both ovaries demonstrates normal low-resistance arterial and venous waveforms in the right ovary.  Other findings  No free fluid.  IMPRESSION: 1. Stable to slight increase in size of a  anterior sub mucosal fibroid. 2. The right ovary is within normal limits. A dominant follicle or paraovarian cyst measures 10 mm maximally.   Electronically Signed   By: San Morelle M.D.   On: 01/10/2015 15:51   US Pelvis Complete  01/10/2015   CLINICAL DATA:  Pelvic pain affecting pregnancy. Pelvic pain for 4 weeks.  EXAM: TRANSABDOMINAL AND TRANSVAGINAL ULTRASOUND OF PELVIS  DOPPLER ULTRASOUND OF OVARIES  TECHNIQUE: Both transabdominal and transvaginal ultrasound examinations of the pelvis were performed. Transabdominal technique was performed for global imaging of the pelvis including uterus, ovaries, adnexal regions, and pelvic cul-de-sac.  It was necessary to proceed with endovaginal exam following the transabdominal exam to visualize the ovaries and endometrium. Color and duplex Doppler ultrasound was utilized to evaluate blood flow to the ovaries.  COMPARISON:  CT of the abdomen and pelvis from the same day. Pelvic ultrasound 11/22/2013.  FINDINGS: Uterus  Measurements: 4.6 x 1.8 x 3.9 cm. A 12 mm anterior submucosal fibroid is stable to slightly increased in size. No other fibroids or other mass visualized.  Endometrium  Thickness: 12 mm.  No focal abnormality visualized.  Right ovary  Measurements: 3.2 x 1.4 x 3.4 cm, within normal limits. A 10 mm follicle or paraovarian cyst is noted.  Left ovary  Surgically absent.  Pulsed Doppler evaluation of both ovaries demonstrates normal low-resistance arterial and venous waveforms in the right ovary.  Other findings  No free fluid.  IMPRESSION: 1. Stable to slight increase in size of a anterior sub mucosal fibroid. 2. The right ovary is within normal limits. A dominant follicle or paraovarian cyst measures 10 mm maximally.   Electronically Signed   By: San Morelle M.D.   On: 01/10/2015 15:51   Ct Abdomen Pelvis W Contrast  01/10/2015   CLINICAL DATA:  54 year old female with a history of abdominal pain. For week duration.  EXAM: CT ABDOMEN  AND PELVIS WITH CONTRAST  TECHNIQUE: Multidetector CT imaging of the abdomen and pelvis was performed using the standard protocol following bolus administration of intravenous contrast.  CONTRAST:  32mL OMNIPAQUE IOHEXOL 300 MG/ML  SOLN  COMPARISON:  Abdominal plain film 07/20/2014, MR lumbar spine 04/18/2010  FINDINGS: Lower chest:  Unremarkable appearance of the superficial soft tissue the chest wall.  Unremarkable appearance of the heart, with no pericardial fluid/ thickening.  Small hiatal hernia.  No pleural effusion or confluent airspace disease.  Abdomen/pelvis:  Focal fatty infiltration of the liver adjacent to the falciform ligament. Unremarkable appearance the spleen.  Unremarkable appearance the bilateral adrenal glands.  Unremarkable appearance of the pancreas and gallbladder. No intrahepatic or extrahepatic biliary ductal dilatation.  Enteric contrast within the distal small bowel and proximal colon. No evidence of abnormally distended small bowel or colon. Normal appendix.  No intraperitoneal free air. No significant free fluid. No abnormally enlarged lymph nodes of the mesenteric. No mesenteric inflammatory changes. Index lymph node at the celiac nodal station measures 8 mm in greatest diameter.  No evidence of left or right-sided hydronephrosis. No nephrolithiasis.  Unremarkable appearance of the course of the bilateral ureters.  Decompressed urinary bladder. The dome of the urinary bladder is peaked towards the midline abdominal wall musculature, extending towards the umbilicus. This may represent a partially persistent urachus.  Unremarkable appearance of the uterus and adnexa.  No evidence of aneurysm or dissection flap. Scattered atherosclerotic calcifications, predominantly distal aorta and the a iliofemoral systems. Calcifications of the pelvic vasculature.  Mesenteric vessels are patent.  Unremarkable appearance of the venous system.  No displaced fracture.  Degenerative changes of L5-S1 with  nearly bone-on-bone articulation. Facet disease of the L4-L5 level. Trace retrolisthesis.  No aggressive bone lesions identified.  IMPRESSION: No acute CT finding to account for the patient's symptoms of abdominal pain.  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 No significant inflammatory changes. Referral for urologic evaluation may be warranted, as urachal remnants are known to rarely develop carcinoma.  Atherosclerosis.  Small hiatal hernia.  Signed,  Dulcy Fanny. Earleen Newport, DO  [facility]  [facility]   Electronically Signed   By: Corrie Mckusick D.O.   On: 01/10/2015 14:10   [place] Art/ven Flow Abd Pelv Doppler  01/10/2015   CLINICAL DATA:  Pelvic pain affecting pregnancy. Pelvic pain for 4 weeks.  EXAM: TRANSABDOMINAL AND TRANSVAGINAL ULTRASOUND OF PELVIS  DOPPLER ULTRASOUND OF OVARIES  TECHNIQUE: Both transabdominal and transvaginal ultrasound examinations of the pelvis were performed. Transabdominal technique was performed for global imaging of the pelvis including uterus, ovaries, adnexal regions, and pelvic cul-de-sac.  It was necessary to proceed with endovaginal exam following the transabdominal exam to visualize the ovaries and endometrium. Color and duplex Doppler ultrasound was utilized  to evaluate blood flow to the ovaries.  COMPARISON:  CT of the abdomen and pelvis from the same day. Pelvic ultrasound 11/22/2013.  FINDINGS: Uterus  Measurements: 4.6 x 1.8 x 3.9 cm. A 12 mm anterior submucosal fibroid is stable to slightly increased in size. No other fibroids or other mass visualized.  Endometrium  Thickness: 12 mm.  No focal abnormality visualized.  Right ovary  Measurements: 3.2 x 1.4 x 3.4 cm, within normal limits. A 10 mm follicle or paraovarian cyst is noted.  Left ovary  Surgically absent.  Pulsed Doppler evaluation of both ovaries demonstrates normal low-resistance  arterial and venous waveforms in the right ovary.  Other findings  No free fluid.  IMPRESSION: 1. Stable to slight increase in size of a anterior sub mucosal fibroid. 2. The right ovary is within normal limits. A dominant follicle or paraovarian cyst measures 10 mm maximally.   Electronically Signed   By: San Morelle M.D.   On: 01/10/2015 15:51     EKG Interpretation   Date/Time:  Wednesday January 10 2015 11:27:31 EDT Ventricular Rate:  62 PR Interval:  150 QRS Duration: 79 QT Interval:  404 QTC Calculation: 410 R Axis:   55 Text Interpretation:  Sinus rhythm Abnormal R-wave progression, early  transition Minimal ST depression, inferior leads - present on 04/07/10  Confirmed by Hazle Coca [phone]) on 01/10/2015 5:00:54 PM       5:35 PM This provider spoke with Dr. Lyman Speller, discussed case, labs, imaging, vitals, ED course in great detail. Patient to be admitted for observation telemetry regarding chest pain.   MDM   Final diagnoses:  Pelvic pain affecting pregnancy  Chest pain, unspecified chest pain type  Hypoxia   **Correction - patient is not pregnant, patient has had a hysterectomy. Accidental clicking of this option - correct diagnosis is pelvic pain in a female, computer would not let this provider change it.**  Medications  iohexol (OMNIPAQUE) 300 MG/ML solution 25 mL (25 mLs Oral Contrast Given 01/10/15 1141)  sodium chloride 0.9 % bolus 1,000 mL (0 mLs Intravenous Stopped 01/10/15 1439)  iohexol (OMNIPAQUE) 300 MG/ML solution 80 mL (80 mLs Intravenous Contrast Given 01/10/15 1333)    Filed Vitals:   01/10/15 1700 01/10/15 1715 01/10/15 1719 01/10/15 1730  BP: 135/72  135/72 151/82  Pulse: 61 64 64 64  Temp:      TempSrc:      Resp: 9 18 18 18   SpO2: 93% 95% 99% 96%   EKG noted sinus rhythm with a heart rate of 62 bpm - deep q waves noted in the inferior leads. Troponin negative elevation. CBC negative elevated leukocytosis. Hemoglobin 13.3, hematocrit 40.6.  CMP unremarkable-glucose 104, negative elevated anion gap. Lipase negative elevation. Urine pregnancy negative. Urinalysis negative for hemoglobin, nitrites, leukocytes-few bacteria identified with granular casts. Wet prep noted few yeast with few white blood cells. Fecal occult negative. Chest x-ray no acute cortical abnormalities. CT head within normal limits. CT abdomen and pelvis with contrast identified no acute CT findings for patient's symptoms-anterior tethering of the urinary bladder dome likely along the spectrum the patent urachus - referral for urologic evaluation may be warranted. Small hiatal hernia identified. Pelvic ultrasound identified stable to slight increase in size of an anterior subcostal fibroid. Right ovary is normal limits with a dominant follicle or paraovarian cyst that measures approximately 10 mm.  Doubt SAH. Doubt ICH. Doubt stroke. Patient has been monitored in ED setting without changes to mentation. Negative findings of PID  are acute infection of the pelvis. Negative findings of UTI or pyelonephritis. Negative findings of torsion. Negative acute abdominal processes. Ultrasound identified right ovarian cyst. Negative findings of pneumonia. Discomfort in the abdomen on the right lower quadrant identified to be due to ovarian cyst. Headache appears to be typical. Patient did present to the ED with chest pain-EKG changes identified with deep Q waves and very subtle ST segment depressions that appeared to be similar to 2011. First troponin not elevated. Patient did appear to be hypoxic in ED setting with a pulse ox of 87% and 89% on room air. D-dimer to be ordered. Patient to be admitted to the hospital for observation regarding hypoxia and chest pain for cardiac rule out. Discussed plan for admission with patient agrees to plan of care. Patient stable for transfer to floor.   Jamse Mead, PA-C 01/10/15 [place], PA-C 01/10/15 [place], MD 01/11/15  [phone]

## 2015-01-10 NOTE — ED Notes (Addendum)
Gave pt [place] sandwich and water per PA Marissa approval. Pt tolerating well at this time.

## 2015-01-10 NOTE — ED Notes (Signed)
Pt finished drinking PO contrast and CT made aware.

## 2015-01-10 NOTE — ED Notes (Signed)
Pt returned from US

## 2015-01-10 NOTE — ED Notes (Signed)
Placed pt on 2L nasal cannula 

## 2015-01-10 NOTE — ED Notes (Signed)
Pt. States she started having abd pain approx 4 weeks ago in the bottom of her abd./pelvic area. Pt. States she had some constipation so she took xlax for it and it helped. Pt. States it has been getting worse weekly. Having BM's weekly. Pt. States her appetite has diminished. Pt. Started having a HA 4 weeks ago as well, that has gotten worse weekly too. Pt. States that she has been taking benadryl allergy medicine and ibuprofen. Pt. States she has had some blurry vision.

## 2015-01-10 NOTE — ED Notes (Signed)
Marissa,PA at the bedside.  

## 2015-01-10 NOTE — ED Notes (Signed)
Lab called to verify troponin I results.

## 2015-01-10 NOTE — ED Notes (Addendum)
CBG 88 

## 2015-01-11 DIAGNOSIS — R079 Chest pain, unspecified: Secondary | ICD-10-CM | POA: Diagnosis not present

## 2015-01-11 DIAGNOSIS — R1031 Right lower quadrant pain: Secondary | ICD-10-CM | POA: Diagnosis not present

## 2015-01-11 DIAGNOSIS — R51 Headache: Secondary | ICD-10-CM | POA: Diagnosis not present

## 2015-01-11 DIAGNOSIS — R0789 Other chest pain: Secondary | ICD-10-CM

## 2015-01-11 DIAGNOSIS — R102 Pelvic and perineal pain: Secondary | ICD-10-CM | POA: Diagnosis not present

## 2015-01-11 DIAGNOSIS — R0902 Hypoxemia: Secondary | ICD-10-CM | POA: Diagnosis not present

## 2015-01-11 DIAGNOSIS — I10 Essential (primary) hypertension: Secondary | ICD-10-CM | POA: Diagnosis not present

## 2015-01-11 LAB — TROPONIN I
Troponin I: <0.03 ng/mL (ref <0.031)
Troponin I: <0.03 ng/mL (ref <0.031)
Troponin I: <0.03 ng/mL (ref <0.031)

## 2015-01-11 LAB — INFLUENZA PANEL BY PCR (TYPE A & B)
H1N1 flu by pcr: NOT DETECTED
Influenza A By PCR: NEGATIVE
Influenza B By PCR: NEGATIVE

## 2015-01-11 LAB — GLUCOSE, CAPILLARY: Glucose-Capillary: 109 mg/dL — ABNORMAL HIGH (ref 70–99)

## 2015-01-11 LAB — CREATININE, SERUM
Creatinine, Ser: 0.94 mg/dL (ref 0.50–1.10)
GFR calc Af Amer: 78 mL/min — ABNORMAL LOW (ref >90)
GFR calc non Af Amer: 67 mL/min — ABNORMAL LOW (ref >90)

## 2015-01-11 LAB — GC/CHLAMYDIA PROBE AMP (~~LOC~~) NOT AT ARMC
Chlamydia: NEGATIVE
Neisseria Gonorrhea: NEGATIVE

## 2015-01-11 MED ORDER — SALINE SPRAY 0.65 % NA SOLN: 1.0000 | NASAL | Status: DC | PRN

## 2015-01-11 MED ORDER — SALINE SPRAY 0.65 % NA SOLN
1.0000 | NASAL | Status: DC | PRN
  Filled 2015-01-11: qty 44

## 2015-01-11 NOTE — Discharge Instructions (Signed)
Your primary doctor is LAWAL GARBA.

## 2015-01-11 NOTE — Progress Notes (Signed)
UR completed 

## 2015-01-11 NOTE — Discharge Summary (Signed)
Discharge Summary  Margaret Webster MGQ:[id number] DOB: 28-Apr-1961  PCP: Barbette Merino, MD  Admit date: 01/10/2015 Discharge date: 01/11/2015  Time spent: 25 minutes  Recommendations for Outpatient Follow-up:  1. Saline nasal spray use when necessary 2. Patient will follow-up with Dr. Mcarthur Rossetti, her PCP as needed   Discharge Diagnoses:  Active Hospital Problems   Diagnosis Date Noted  . Chest pain 01/10/2015  . Diabetes mellitus 01/10/2015  . Hypertension 01/10/2015  . Thyroid disease 01/10/2015  . Headache 01/10/2015  . Abdominal pain 01/10/2015    Resolved Hospital Problems   Diagnosis Date Noted Date Resolved  No resolved problems to display.    Discharge Condition: Improved, being discharged home  Diet recommendation: Carb modified  Filed Weights   01/10/15 2000 01/11/15 0440  Weight: 90.719 kg (200 lb) 90.655 kg (199 lb 13.7 oz)    History of present illness:  54 year old female past history of diabetes and hypothyroidism can to the emergency room on 3/16 complaining of intermittent headaches and vague abdominal pain with unrevealing findings by CT scan of abdomen pelvis or head. She also was complaining of some vague chest pain and was noted to have a few episodes of hypoxia. She was brought to the hospital service for further evaluation.  Hospital Course:  Principal Problem:   Chest pain-likely GERD: Enzymes 3 negative. Heart score 4 at best. Initial EKG unchanged from previous and repeat EKG in the morning unchanged as well. No further workup indicated. D-dimer within normal limits. Active Problems:   Diabetes mellitus: CBGs remained stable during hospitalization, staying under 150 patient on sliding scale   Hypertension   Thyroid disease: Patient continued on Synthroid   Headache: Looked to be migraine related. Patient isn't sensitive to light. After having some sleep, she felt better.   Abdominal pain: No acute findings by CT scan. No evidence of  UTI.   Procedures:  None  Consultations:  None  Discharge Exam: BP 127/77 mmHg  Pulse 60  Temp(Src) 98.3 F (36.8 C) (Oral)  Resp 18  Ht 5\' 8"  (1.727 m)  Wt 90.655 kg (199 lb 13.7 oz)  BMI 30.40 kg/m2  SpO2 100%  General: Alert and oriented 3, no acute distress Cardiovascular: Regular rate and rhythm, S1-S2 Respiratory: Clear to auscultation bilaterally  Discharge Instructions You were cared for by a hospitalist during your hospital stay. If you have any questions about your discharge medications or the care you received while you were in the hospital after you are discharged, you can call the unit and asked to speak with the hospitalist on call if the hospitalist that took care of you is not available. Once you are discharged, your primary care physician will handle any further medical issues. Please note that NO REFILLS for any discharge medications will be authorized once you are discharged, as it is imperative that you return to your primary care physician (or establish a relationship with a primary care physician if you do not have one) for your aftercare needs so that they can reassess your need for medications and monitor your lab values.  Discharge Instructions    Diet Carb Modified    Complete by:  As directed      Increase activity slowly    Complete by:  As directed             Medication List    TAKE these medications        levothyroxine 125 MCG tablet  Commonly known as:  SYNTHROID, LEVOTHROID  Take  125 mcg by mouth daily.     lisinopril 20 MG tablet  Commonly known as:  PRINIVIL,ZESTRIL  Take 20 mg by mouth daily.     LYRICA 75 MG capsule  Generic drug:  pregabalin  Take 75 mg by mouth daily.     metFORMIN 500 MG 24 hr tablet  Commonly known as:  GLUCOPHAGE-XR  Take 500 mg by mouth daily.  Notes to Patient:  Hold for 48 hours after contrast adminstartion     simvastatin 10 MG tablet  Commonly known as:  ZOCOR  Take 20 mg by mouth daily.      sodium chloride 0.65 % Soln nasal spray  Commonly known as:  OCEAN  Place 1 spray into both nostrils as needed for congestion.     ZETIA 10 MG tablet  Generic drug:  ezetimibe  Take 10 mg by mouth daily.       Allergies  Allergen Reactions  . Sulfa Antibiotics Other (See Comments)    Hard to breathe  . Influenza Vaccines Hives  . Lipitor [Atorvastatin Calcium] Other (See Comments)    Muscle ache   . Naproxen Nausea And Vomiting      The results of significant diagnostics from this hospitalization (including imaging, microbiology, ancillary and laboratory) are listed below for reference.    Significant Diagnostic Studies: Dg Chest 2 View  01/10/2015   CLINICAL DATA:  54 year old female with abdominal pain which is increasing. Initial encounter.  EXAM: CHEST  2 VIEW  COMPARISON:  07/05/2014 and earlier.  FINDINGS: Normal lung volumes. Normal cardiac size and mediastinal contours. No pneumothorax, pulmonary edema, pleural effusion or confluent pulmonary opacity. No pneumoperitoneum. Negative visible bowel gas pattern. Chronic cervical ACDF hardware. No acute osseous abnormality identified.  IMPRESSION: No acute cardiopulmonary abnormality.   Electronically Signed   By: Genevie Ann M.D.   On: 01/10/2015 13:16   Ct Head Wo Contrast  01/10/2015   CLINICAL DATA:  Progressive headache for approximately 1 month. Intermittent blurred vision.  EXAM: CT HEAD WITHOUT CONTRAST  TECHNIQUE: Contiguous axial images were obtained from the base of the skull through the vertex without intravenous contrast.  COMPARISON:  None.  FINDINGS: The ventricles are normal in size and configuration. There is no mass, hemorrhage, extra-axial fluid collection, or midline shift. Gray-white compartments appear normal. No acute infarct apparent. Bony calvarium appears intact. The mastoid air cells clear.  IMPRESSION: Study within normal limits.   Electronically Signed   By: Lowella Grip III M.D.   On: 01/10/2015 13:56    US Transvaginal Non-ob  01/10/2015   CLINICAL DATA:  Pelvic pain affecting pregnancy. Pelvic pain for 4 weeks.  EXAM: TRANSABDOMINAL AND TRANSVAGINAL ULTRASOUND OF PELVIS  DOPPLER ULTRASOUND OF OVARIES  TECHNIQUE: Both transabdominal and transvaginal ultrasound examinations of the pelvis were performed. Transabdominal technique was performed for global imaging of the pelvis including uterus, ovaries, adnexal regions, and pelvic cul-de-sac.  It was necessary to proceed with endovaginal exam following the transabdominal exam to visualize the ovaries and endometrium. Color and duplex Doppler ultrasound was utilized to evaluate blood flow to the ovaries.  COMPARISON:  CT of the abdomen and pelvis from the same day. Pelvic ultrasound 11/22/2013.  FINDINGS: Uterus  Measurements: 4.6 x 1.8 x 3.9 cm. A 12 mm anterior submucosal fibroid is stable to slightly increased in size. No other fibroids or other mass visualized.  Endometrium  Thickness: 12 mm.  No focal abnormality visualized.  Right ovary  Measurements: 3.2 x 1.4 x 3.4  cm, within normal limits. A 10 mm follicle or paraovarian cyst is noted.  Left ovary  Surgically absent.  Pulsed Doppler evaluation of both ovaries demonstrates normal low-resistance arterial and venous waveforms in the right ovary.  Other findings  No free fluid.  IMPRESSION: 1. Stable to slight increase in size of a anterior sub mucosal fibroid. 2. The right ovary is within normal limits. A dominant follicle or paraovarian cyst measures 10 mm maximally.   Electronically Signed   By: San Morelle M.D.   On: 01/10/2015 15:51   US Pelvis Complete  01/10/2015   CLINICAL DATA:  Pelvic pain affecting pregnancy. Pelvic pain for 4 weeks.  EXAM: TRANSABDOMINAL AND TRANSVAGINAL ULTRASOUND OF PELVIS  DOPPLER ULTRASOUND OF OVARIES  TECHNIQUE: Both transabdominal and transvaginal ultrasound examinations of the pelvis were performed. Transabdominal technique was performed for global imaging of the  pelvis including uterus, ovaries, adnexal regions, and pelvic cul-de-sac.  It was necessary to proceed with endovaginal exam following the transabdominal exam to visualize the ovaries and endometrium. Color and duplex Doppler ultrasound was utilized to evaluate blood flow to the ovaries.  COMPARISON:  CT of the abdomen and pelvis from the same day. Pelvic ultrasound 11/22/2013.  FINDINGS: Uterus  Measurements: 4.6 x 1.8 x 3.9 cm. A 12 mm anterior submucosal fibroid is stable to slightly increased in size. No other fibroids or other mass visualized.  Endometrium  Thickness: 12 mm.  No focal abnormality visualized.  Right ovary  Measurements: 3.2 x 1.4 x 3.4 cm, within normal limits. A 10 mm follicle or paraovarian cyst is noted.  Left ovary  Surgically absent.  Pulsed Doppler evaluation of both ovaries demonstrates normal low-resistance arterial and venous waveforms in the right ovary.  Other findings  No free fluid.  IMPRESSION: 1. Stable to slight increase in size of a anterior sub mucosal fibroid. 2. The right ovary is within normal limits. A dominant follicle or paraovarian cyst measures 10 mm maximally.   Electronically Signed   By: San Morelle M.D.   On: 01/10/2015 15:51   Ct Abdomen Pelvis W Contrast  01/10/2015   CLINICAL DATA:  54 year old female with a history of abdominal pain. For week duration.  EXAM: CT ABDOMEN AND PELVIS WITH CONTRAST  TECHNIQUE: Multidetector CT imaging of the abdomen and pelvis was performed using the standard protocol following bolus administration of intravenous contrast.  CONTRAST:  67mL OMNIPAQUE IOHEXOL 300 MG/ML  SOLN  COMPARISON:  Abdominal plain film 07/20/2014, MR lumbar spine 04/18/2010  FINDINGS: Lower chest:  Unremarkable appearance of the superficial soft tissue the chest wall.  Unremarkable appearance of the heart, with no pericardial fluid/ thickening.  Small hiatal hernia.  No pleural effusion or confluent airspace disease.  Abdomen/pelvis:  Focal fatty  infiltration of the liver adjacent to the falciform ligament. Unremarkable appearance the spleen.  Unremarkable appearance the bilateral adrenal glands.  Unremarkable appearance of the pancreas and gallbladder. No intrahepatic or extrahepatic biliary ductal dilatation.  Enteric contrast within the distal small bowel and proximal colon. No evidence of abnormally distended small bowel or colon. Normal appendix.  No intraperitoneal free air. No significant free fluid. No abnormally enlarged lymph nodes of the mesenteric. No mesenteric inflammatory changes. Index lymph node at the celiac nodal station measures 8 mm in greatest diameter.  No evidence of left or right-sided hydronephrosis. No nephrolithiasis.  Unremarkable appearance of the course of the bilateral ureters.  Decompressed urinary bladder. The dome of the urinary bladder is peaked towards the midline  abdominal wall musculature, extending towards the umbilicus. This may represent a partially persistent urachus.  Unremarkable appearance of the uterus and adnexa.  No evidence of aneurysm or dissection flap. Scattered atherosclerotic calcifications, predominantly distal aorta and the a iliofemoral systems. Calcifications of the pelvic vasculature.  Mesenteric vessels are patent.  Unremarkable appearance of the venous system.  No displaced fracture.  Degenerative changes of L5-S1 with nearly bone-on-bone articulation. Facet disease of the L4-L5 level. Trace retrolisthesis.  No aggressive bone lesions identified.  IMPRESSION: No acute CT finding to account for the patient's symptoms of abdominal pain.  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 No significant inflammatory changes. Referral for urologic evaluation may be warranted, as urachal remnants are known to rarely develop carcinoma.  Atherosclerosis.  Small hiatal hernia.  Signed,  Dulcy Fanny. Earleen Newport, DO  [facility]  [facility]   Electronically Signed   By: Corrie Mckusick D.O.   On: 01/10/2015 14:10   [place] Art/ven Flow Abd Pelv Doppler  01/10/2015   CLINICAL DATA:  Pelvic pain affecting pregnancy. Pelvic pain for 4 weeks.  EXAM: TRANSABDOMINAL AND TRANSVAGINAL ULTRASOUND OF PELVIS  DOPPLER ULTRASOUND OF OVARIES  TECHNIQUE: Both transabdominal and transvaginal ultrasound examinations of the pelvis were performed. Transabdominal technique was performed for global imaging of the pelvis including uterus, ovaries, adnexal regions, and pelvic cul-de-sac.  It was necessary to proceed with endovaginal exam following the transabdominal exam to visualize the ovaries and endometrium. Color and duplex Doppler ultrasound was utilized to evaluate blood flow to the ovaries.  COMPARISON:  CT of the abdomen and pelvis from the same day. Pelvic ultrasound 11/22/2013.  FINDINGS: Uterus  Measurements: 4.6 x 1.8 x 3.9 cm. A 12 mm anterior submucosal fibroid is stable to slightly increased in size. No other fibroids or other mass visualized.  Endometrium  Thickness: 12 mm.  No focal abnormality visualized.  Right ovary  Measurements: 3.2 x 1.4 x 3.4 cm, within normal limits. A 10 mm follicle or paraovarian cyst is noted.  Left ovary  Surgically absent.  Pulsed Doppler evaluation of both ovaries demonstrates normal low-resistance arterial and venous waveforms in the right ovary.  Other findings  No free fluid.  IMPRESSION: 1. Stable to slight increase in size of a anterior sub mucosal fibroid. 2. The right ovary is within normal limits. A dominant follicle or paraovarian cyst measures 10 mm maximally.   Electronically Signed   By: San Morelle M.D.   On: 01/10/2015 15:51    Microbiology: Recent Results (from the past 240 hour(s))  Wet prep, genital     Status: Abnormal   Collection Time: 01/10/15 12:02 PM  Result Value Ref Range Status   Yeast Wet Prep HPF POC FEW (A) NONE SEEN Final    Trich, Wet Prep NONE SEEN NONE SEEN Final   Clue Cells Wet Prep HPF POC NONE SEEN NONE SEEN Final   WBC, Wet Prep HPF POC FEW (A) NONE SEEN Final     Labs: Basic Metabolic Panel:  Recent Labs Lab 01/10/15 1134 01/10/15 2230  NA 140  --   K 4.0  --   CL 106  --   CO2 27  --   GLUCOSE 104*  --   BUN 8  --   CREATININE 1.09 0.94  CALCIUM 9.7  --    Liver Function Tests:  Recent Labs Lab 01/10/15 1134  AST 19  ALT 13  ALKPHOS 63  BILITOT 0.9  PROT 6.7  ALBUMIN 3.4*    Recent Labs Lab 01/10/15 1134  LIPASE 23   No results for input(s): AMMONIA in the last 168 hours. CBC:  Recent Labs Lab 01/10/15 1134 01/10/15 2230  WBC 5.5 5.6  NEUTROABS 2.6  --   HGB 13.6 12.3  HCT 40.6 35.5*  MCV 91.6 89.9  PLT 256 235   Cardiac Enzymes:  Recent Labs Lab 01/10/15 2230 01/11/15 0116 01/11/15 0440  TROPONINI <0.03 <0.03 <0.03   BNP: BNP (last 3 results) No results for input(s): BNP in the last 8760 hours.  ProBNP (last 3 results) No results for input(s): PROBNP in the last 8760 hours.  CBG:  Recent Labs Lab 01/10/15 1236 01/10/15 1837 01/10/15 2103 01/11/15 0748  GLUCAP 88 122* 132* 109*       Signed:  Yerick Eggebrecht K  [facility] 01/11/2015, 5:59 PM

## 2015-01-12 LAB — HEMOGLOBIN A1C
Hgb A1c MFr Bld: 5.9 % — ABNORMAL HIGH (ref 4.8–5.6)
Mean Plasma Glucose: 123 mg/dL

## 2015-03-21 ENCOUNTER — Emergency Department (HOSPITAL_COMMUNITY)
Admission: EM | Admit: 2015-03-21 | Discharge: 2015-03-21 | Disposition: A | Discharge status: Home / Self Care | Payer: Medicare Other | Attending: Emergency Medicine | Admitting: Emergency Medicine

## 2015-03-21 ENCOUNTER — Emergency Department (HOSPITAL_COMMUNITY): Payer: Medicare Other

## 2015-03-21 ENCOUNTER — Encounter (HOSPITAL_COMMUNITY): Payer: Self-pay | Admitting: Emergency Medicine

## 2015-03-21 DIAGNOSIS — Y9289 Other specified places as the place of occurrence of the external cause: Secondary | ICD-10-CM | POA: Diagnosis not present

## 2015-03-21 DIAGNOSIS — S4992XA Unspecified injury of left shoulder and upper arm, initial encounter: Secondary | ICD-10-CM | POA: Diagnosis not present

## 2015-03-21 DIAGNOSIS — Z720 Tobacco use: Secondary | ICD-10-CM | POA: Insufficient documentation

## 2015-03-21 DIAGNOSIS — M25511 Pain in right shoulder: Secondary | ICD-10-CM

## 2015-03-21 DIAGNOSIS — E079 Disorder of thyroid, unspecified: Secondary | ICD-10-CM | POA: Insufficient documentation

## 2015-03-21 DIAGNOSIS — Y9389 Activity, other specified: Secondary | ICD-10-CM | POA: Insufficient documentation

## 2015-03-21 DIAGNOSIS — Y998 Other external cause status: Secondary | ICD-10-CM | POA: Insufficient documentation

## 2015-03-21 DIAGNOSIS — S3992XA Unspecified injury of lower back, initial encounter: Secondary | ICD-10-CM | POA: Insufficient documentation

## 2015-03-21 DIAGNOSIS — Z8709 Personal history of other diseases of the respiratory system: Secondary | ICD-10-CM | POA: Diagnosis not present

## 2015-03-21 DIAGNOSIS — T148XXA Other injury of unspecified body region, initial encounter: Secondary | ICD-10-CM

## 2015-03-21 DIAGNOSIS — I10 Essential (primary) hypertension: Secondary | ICD-10-CM | POA: Diagnosis not present

## 2015-03-21 DIAGNOSIS — S4991XA Unspecified injury of right shoulder and upper arm, initial encounter: Secondary | ICD-10-CM | POA: Diagnosis not present

## 2015-03-21 DIAGNOSIS — S80211A Abrasion, right knee, initial encounter: Secondary | ICD-10-CM | POA: Insufficient documentation

## 2015-03-21 DIAGNOSIS — Z79899 Other long term (current) drug therapy: Secondary | ICD-10-CM | POA: Diagnosis not present

## 2015-03-21 DIAGNOSIS — Z23 Encounter for immunization: Secondary | ICD-10-CM | POA: Diagnosis not present

## 2015-03-21 DIAGNOSIS — W19XXXA Unspecified fall, initial encounter: Secondary | ICD-10-CM

## 2015-03-21 DIAGNOSIS — W010XXA Fall on same level from slipping, tripping and stumbling without subsequent striking against object, initial encounter: Secondary | ICD-10-CM | POA: Diagnosis not present

## 2015-03-21 DIAGNOSIS — E119 Type 2 diabetes mellitus without complications: Secondary | ICD-10-CM | POA: Insufficient documentation

## 2015-03-21 DIAGNOSIS — G8929 Other chronic pain: Secondary | ICD-10-CM | POA: Diagnosis not present

## 2015-03-21 DIAGNOSIS — S8991XA Unspecified injury of right lower leg, initial encounter: Secondary | ICD-10-CM | POA: Diagnosis present

## 2015-03-21 DIAGNOSIS — M25561 Pain in right knee: Secondary | ICD-10-CM

## 2015-03-21 DIAGNOSIS — M549 Dorsalgia, unspecified: Secondary | ICD-10-CM

## 2015-03-21 MED ORDER — TETANUS-DIPHTH-ACELL PERTUSSIS 5-2.5-18.5 LF-MCG/0.5 IM SUSP
0.5000 mL | Freq: Once | INTRAMUSCULAR | Status: AC
  Administered 2015-03-21: 0.5 mL via INTRAMUSCULAR
  Filled 2015-03-21: qty 0.5

## 2015-03-21 MED ORDER — TRAMADOL HCL 50 MG PO TABS: 50.0000 mg | ORAL_TABLET | Freq: Four times a day (QID) | ORAL | Status: DC | PRN

## 2015-03-21 MED ORDER — IBUPROFEN 400 MG PO TABS: 800.0000 mg | ORAL_TABLET | Freq: Once | ORAL | Status: DC

## 2015-03-21 MED ORDER — CYCLOBENZAPRINE HCL 10 MG PO TABS: 10.0000 mg | ORAL_TABLET | Freq: Two times a day (BID) | ORAL | Status: DC | PRN

## 2015-03-21 NOTE — Discharge Instructions (Signed)
Knee Pain The knee is the complex joint between your thigh and your lower leg. It is made up of bones, tendons, ligaments, and cartilage. The bones that make up the knee are:  The femur in the thigh.  The tibia and fibula in the lower leg.  The patella or kneecap riding in the groove on the lower femur. CAUSES  Knee pain is a common complaint with many causes. A few of these causes are:  Injury, such as:  A ruptured ligament or tendon injury.  Torn cartilage.  Medical conditions, such as:  Gout  Arthritis  Infections  Overuse, over training, or overdoing a physical activity. Knee pain can be minor or severe. Knee pain can accompany debilitating injury. Minor knee problems often respond well to self-care measures or get well on their own. More serious injuries may need medical intervention or even surgery. SYMPTOMS The knee is complex. Symptoms of knee problems can vary widely. Some of the problems are:  Pain with movement and weight bearing.  Swelling and tenderness.  Buckling of the knee.  Inability to straighten or extend your knee.  Your knee locks and you cannot straighten it.  Warmth and redness with pain and fever.  Deformity or dislocation of the kneecap. DIAGNOSIS  Determining what is wrong may be very straight forward such as when there is an injury. It can also be challenging because of the complexity of the knee. Tests to make a diagnosis may include:  Your caregiver taking a history and doing a physical exam.  Routine X-rays can be used to rule out other problems. X-rays will not reveal a cartilage tear. Some injuries of the knee can be diagnosed by:  Arthroscopy a surgical technique by which a small video camera is inserted through tiny incisions on the sides of the knee. This procedure is used to examine and repair internal knee joint problems. Tiny instruments can be used during arthroscopy to repair the torn knee cartilage (meniscus).  Arthrography  is a radiology technique. A contrast liquid is directly injected into the knee joint. Internal structures of the knee joint then become visible on X-ray film.  An MRI scan is a non X-ray radiology procedure in which magnetic fields and a computer produce two- or three-dimensional images of the inside of the knee. Cartilage tears are often visible using an MRI scanner. MRI scans have largely replaced arthrography in diagnosing cartilage tears of the knee.  Blood work.  Examination of the fluid that helps to lubricate the knee joint (synovial fluid). This is done by taking a sample out using a needle and a syringe. TREATMENT The treatment of knee problems depends on the cause. Some of these treatments are:  Depending on the injury, proper casting, splinting, surgery, or physical therapy care will be needed.  Give yourself adequate recovery time. Do not overuse your joints. If you begin to get sore during workout routines, back off. Slow down or do fewer repetitions.  For repetitive activities such as cycling or running, maintain your strength and nutrition.  Alternate muscle groups. For example, if you are a weight lifter, work the upper body on one day and the lower body the next.  Either tight or weak muscles do not give the proper support for your knee. Tight or weak muscles do not absorb the stress placed on the knee joint. Keep the muscles surrounding the knee strong.  Take care of mechanical problems.  If you have flat feet, orthotics or special shoes may help.  See your caregiver if you need help.  Arch supports, sometimes with wedges on the inner or outer aspect of the heel, can help. These can shift pressure away from the side of the knee most bothered by osteoarthritis.  A brace called an "unloader" brace also may be used to help ease the pressure on the most arthritic side of the knee.  If your caregiver has prescribed crutches, braces, wraps or ice, use as directed. The acronym  for this is PRICE. This means protection, rest, ice, compression, and elevation.  Nonsteroidal anti-inflammatory drugs (NSAIDs), can help relieve pain. But if taken immediately after an injury, they may actually increase swelling. Take NSAIDs with food in your stomach. Stop them if you develop stomach problems. Do not take these if you have a history of ulcers, stomach pain, or bleeding from the bowel. Do not take without your caregiver's approval if you have problems with fluid retention, heart failure, or kidney problems.  For ongoing knee problems, physical therapy may be helpful.  Glucosamine and chondroitin are over-the-counter dietary supplements. Both may help relieve the pain of osteoarthritis in the knee. These medicines are different from the usual anti-inflammatory drugs. Glucosamine may decrease the rate of cartilage destruction.  Injections of a corticosteroid drug into your knee joint may help reduce the symptoms of an arthritis flare-up. They may provide pain relief that lasts a few months. You may have to wait a few months between injections. The injections do have a small increased risk of infection, water retention, and elevated blood sugar levels.  Hyaluronic acid injected into damaged joints may ease pain and provide lubrication. These injections may work by reducing inflammation. A series of shots may give relief for as long as 6 months.  Topical painkillers. Applying certain ointments to your skin may help relieve the pain and stiffness of osteoarthritis. Ask your pharmacist for suggestions. Many over the-counter products are approved for temporary relief of arthritis pain.  In some countries, doctors often prescribe topical NSAIDs for relief of chronic conditions such as arthritis and tendinitis. A review of treatment with NSAID creams found that they worked as well as oral medications but without the serious side effects. PREVENTION  Maintain a healthy weight. Extra pounds  put more strain on your joints.  Get strong, stay limber. Weak muscles are a common cause of knee injuries. Stretching is important. Include flexibility exercises in your workouts.  Be smart about exercise. If you have osteoarthritis, chronic knee pain or recurring injuries, you may need to change the way you exercise. This does not mean you have to stop being active. If your knees ache after jogging or playing basketball, consider switching to swimming, water aerobics, or other low-impact activities, at least for a few days a week. Sometimes limiting high-impact activities will provide relief.  Make sure your shoes fit well. Choose footwear that is right for your sport.  Protect your knees. Use the proper gear for knee-sensitive activities. Use kneepads when playing volleyball or laying carpet. Buckle your seat belt every time you drive. Most shattered kneecaps occur in car accidents.  Rest when you are tired. SEEK MEDICAL CARE IF:  You have knee pain that is continual and does not seem to be getting better.  SEEK IMMEDIATE MEDICAL CARE IF:  Your knee joint feels hot to the touch and you have a high fever. MAKE SURE YOU:   Understand these instructions.  Will watch your condition.  Will get help right away if you are not  doing well or get worse. Document Released: 08/10/2007 Document Revised: 01/05/2012 Document Reviewed: 08/10/2007 [facility] Patient Information 2015 [place], [place]. This information is not intended to replace advice given to you by your health care provider. Make sure you discuss any questions you have with your health care provider.  Shoulder Pain The shoulder is the joint that connects your arms to your body. The bones that form the shoulder joint include the upper arm bone (humerus), the shoulder blade (scapula), and the collarbone (clavicle). The top of the humerus is shaped like a ball and fits into a rather flat socket on the scapula (glenoid cavity). A combination  of muscles and strong, fibrous tissues that connect muscles to bones (tendons) support your shoulder joint and hold the ball in the socket. Small, fluid-filled sacs (bursae) are located in different areas of the joint. They act as cushions between the bones and the overlying soft tissues and help reduce friction between the gliding tendons and the bone as you move your arm. Your shoulder joint allows a wide range of motion in your arm. This range of motion allows you to do things like scratch your back or throw a ball. However, this range of motion also makes your shoulder more prone to pain from overuse and injury. Causes of shoulder pain can originate from both injury and overuse and usually can be grouped in the following four categories:  Redness, swelling, and pain (inflammation) of the tendon (tendinitis) or the bursae (bursitis).  Instability, such as a dislocation of the joint.  Inflammation of the joint (arthritis).  Broken bone (fracture). HOME CARE INSTRUCTIONS   Apply ice to the sore area.  Put ice in a plastic bag.  Place a towel between your skin and the bag.  Leave the ice on for 15-20 minutes, 3-4 times per day for the first 2 days, or as directed by your health care provider.  Stop using cold packs if they do not help with the pain.  If you have a shoulder sling or immobilizer, wear it as long as your caregiver instructs. Only remove it to shower or bathe. Move your arm as little as possible, but keep your hand moving to prevent swelling.  Squeeze a soft ball or foam pad as much as possible to help prevent swelling.  Only take over-the-counter or prescription medicines for pain, discomfort, or fever as directed by your caregiver. SEEK MEDICAL CARE IF:   Your shoulder pain increases, or new pain develops in your arm, hand, or fingers.  Your hand or fingers become cold and numb.  Your pain is not relieved with medicines. SEEK IMMEDIATE MEDICAL CARE IF:   Your arm,  hand, or fingers are numb or tingling.  Your arm, hand, or fingers are significantly swollen or turn white or blue. MAKE SURE YOU:   Understand these instructions.  Will watch your condition.  Will get help right away if you are not doing well or get worse. Document Released: 07/23/2005 Document Revised: 02/27/2014 Document Reviewed: 09/27/2011 [facility] Patient Information 2015 [place], [place]. This information is not intended to replace advice given to you by your health care provider. Make sure you discuss any questions you have with your health care provider.  Back Pain, Adult Low back pain is very common. About 1 in 5 people have back pain.The cause of low back pain is rarely dangerous. The pain often gets better over time.About half of people with a sudden onset of back pain feel better in just 2 weeks.  About 8 in 10 people feel better by 6 weeks.  CAUSES Some common causes of back pain include:  Strain of the muscles or ligaments supporting the spine.  Wear and tear (degeneration) of the spinal discs.  Arthritis.  Direct injury to the back. DIAGNOSIS Most of the time, the direct cause of low back pain is not known.However, back pain can be treated effectively even when the exact cause of the pain is unknown.Answering your caregiver's questions about your overall health and symptoms is one of the most accurate ways to make sure the cause of your pain is not dangerous. If your caregiver needs more information, he or she may order lab work or imaging tests (X-rays or MRIs).However, even if imaging tests show changes in your back, this usually does not require surgery. HOME CARE INSTRUCTIONS For many people, back pain returns.Since low back pain is rarely dangerous, it is often a condition that people can learn to [facility] their own.   Remain active. It is stressful on the back to sit or stand in one place. Do not sit, drive, or stand in one place for more than 30 minutes at a  time. Take short walks on level surfaces as soon as pain allows.Try to increase the length of time you walk each day.  Do not stay in bed.Resting more than 1 or 2 days can delay your recovery.  Do not avoid exercise or work.Your body is made to move.It is not dangerous to be active, even though your back may hurt.Your back will likely heal faster if you return to being active before your pain is gone.  Pay attention to your body when you bend and lift. Many people have less discomfortwhen lifting if they bend their knees, keep the load close to their bodies,and avoid twisting. Often, the most comfortable positions are those that put less stress on your recovering back.  Find a comfortable position to sleep. Use a firm mattress and lie on your side with your knees slightly bent. If you lie on your back, put a pillow under your knees.  Only take over-the-counter or prescription medicines as directed by your caregiver. Over-the-counter medicines to reduce pain and inflammation are often the most helpful.Your caregiver may prescribe muscle relaxant drugs.These medicines help dull your pain so you can more quickly return to your normal activities and healthy exercise.  Put ice on the injured area.  Put ice in a plastic bag.  Place a towel between your skin and the bag.  Leave the ice on for 15-20 minutes, 03-04 times a day for the first 2 to 3 days. After that, ice and heat may be alternated to reduce pain and spasms.  Ask your caregiver about trying back exercises and gentle massage. This may be of some benefit.  Avoid feeling anxious or stressed.Stress increases muscle tension and can worsen back pain.It is important to recognize when you are anxious or stressed and learn ways to manage it.Exercise is a great option. SEEK MEDICAL CARE IF:  You have pain that is not relieved with rest or medicine.  You have pain that does not improve in 1 week.  You have new symptoms.  You are  generally not feeling well. SEEK IMMEDIATE MEDICAL CARE IF:   You have pain that radiates from your back into your legs.  You develop new bowel or bladder control problems.  You have unusual weakness or numbness in your arms or legs.  You develop nausea or vomiting.  You develop  abdominal pain.  You feel faint. Document Released: 10/13/2005 Document Revised: 04/13/2012 Document Reviewed: 02/14/2014 [facility] Patient Information 2015 [place], [place]. This information is not intended to replace advice given to you by your health care provider. Make sure you discuss any questions you have with your health care provider.

## 2015-03-21 NOTE — ED Notes (Signed)
Pt sts tripped and fall today c/o right sided pain; abrasion noted to right knee

## 2015-03-21 NOTE — ED Provider Notes (Signed)
CSN: [id number]     Arrival date & time 03/21/15  1314 History  This chart was scribed for non-physician practitioner, Margarita Mail, PA-C working with Milton Ferguson, MD by Hansel Feinstein, ED scribe. This patient was seen in room TR08C/TR08C and the patient's care was started at 2:56 PM    Chief Complaint  Patient presents with  . Fall  . Knee Pain   The history is provided by the patient. No language interpreter was used.    HPI Comments: Margaret Webster is a 54 y.o. female with Hx of DM and HTN who presents to the Emergency Department complaining of a mechanical fall 4 hours PTA with associated right-sided shoulder pain, right-sided knee pain.  She reports losing her balance and falling from a standing position onto her right side. She denies LOC or head injury.  She reports that she took Tylenol PTA with mild relief.  Past Medical History  Diagnosis Date  . Diabetes mellitus   . Chronic back pain   . Hypertension   . Bronchitis   . Thyroid disease    Past Surgical History  Procedure Laterality Date  . Back surgery  1993  . Oophorectomy    . Cervical spine surgery Right 11/2012   Family History  Problem Relation Age of Onset  . Cystic fibrosis Sister   . Cancer Sister     ?  Marland Kitchen Heart disease Maternal Grandmother   . Clotting disorder Maternal Grandfather   . Diabetes Mother    History  Substance Use Topics  . Smoking status: Current Every Day Smoker -- 0.50 packs/day  . Smokeless tobacco: Never Used  . Alcohol Use: Yes     Comment: rarely   OB History    No data available     Review of Systems  Musculoskeletal: Positive for back pain, arthralgias and neck pain.  Skin: Positive for wound.    Allergies  Sulfa antibiotics; Influenza vaccines; Lipitor; and Naproxen  Home Medications   Prior to Admission medications   Medication Sig Start Date End Date Taking? Authorizing Provider  cyclobenzaprine (FLEXERIL) 10 MG tablet Take 1 tablet (10 mg total) by mouth 2 (two)  times daily as needed for muscle spasms. 03/21/15   Margarita Mail, PA-C  levothyroxine (SYNTHROID, LEVOTHROID) 125 MCG tablet Take 125 mcg by mouth daily. 05/23/14   Historical Provider, MD  lisinopril (PRINIVIL,ZESTRIL) 20 MG tablet Take 20 mg by mouth daily. 06/18/14   Historical Provider, MD  LYRICA 75 MG capsule Take 75 mg by mouth daily. 04/26/14   Historical Provider, MD  metFORMIN (GLUCOPHAGE-XR) 500 MG 24 hr tablet Take 500 mg by mouth daily. 12/04/14   Historical Provider, MD  simvastatin (ZOCOR) 10 MG tablet Take 20 mg by mouth daily.     Historical Provider, MD  sodium chloride (OCEAN) 0.65 % SOLN nasal spray Place 1 spray into both nostrils as needed for congestion. 01/11/15   Annita Brod, MD  traMADol (ULTRAM) 50 MG tablet Take 1 tablet (50 mg total) by mouth every 6 (six) hours as needed. 03/21/15   Calyn Rubi, PA-C  ZETIA 10 MG tablet Take 10 mg by mouth daily. 06/18/14   Historical Provider, MD   BP 126/75 mmHg  Pulse 68  Temp(Src) 97.7 F (36.5 C) (Oral)  Resp 18  SpO2 98% Physical Exam  Constitutional: She is oriented to person, place, and time. She appears well-developed and well-nourished. No distress.  HENT:  Head: Normocephalic and atraumatic.  Eyes: Conjunctivae and EOM  are normal.  Neck: Neck supple.  Cardiovascular: Normal rate.   Pulmonary/Chest: Breath sounds normal. No respiratory distress.  Musculoskeletal:  TTP to left trapezius.  Full passive ROM and strength of right shoulder.  Pain with active flexion of the right shoulder.   Full active ROM of ligaments of right knee; stable; NVI  Neurological: She is alert and oriented to person, place, and time.  Skin: Skin is warm and dry.  Abrasion over the tibial tuberosity right knee.     Psychiatric: Her behavior is normal.  Nursing note and vitals reviewed.   ED Course  Procedures (including critical care time) DIAGNOSTIC STUDIES: Oxygen Saturation is 98% on RA, normal by my interpretation.     COORDINATION OF CARE: 3:02 PM Informed patient of negative imaging.  Will order ibuprofen.  Pt agreed to plan.   Labs Review Labs Reviewed - No data to display  Imaging Review Dg Thoracic Spine 2 View  03/21/2015   CLINICAL DATA:  Pain after fall  EXAM: THORACIC SPINE - 3 VIEW  COMPARISON:  Chest radiograph January 10, 2015  FINDINGS: Frontal, lateral, and swimmer's views were obtained. There is no fracture or spondylolisthesis. There is mild disc space narrowing at several levels. There are anterior and lateral osteophytes at several levels in the mid to lower thoracic spine. No erosive change. There is postoperative change anteriorly in the lower cervical region.  IMPRESSION: Areas of relatively mild osteoarthritic change. No fracture or spondylolisthesis. Postoperative change anteriorly in the cervical spine region.   Electronically Signed   By: Lowella Grip III M.D.   On: 03/21/2015 14:50   Dg Lumbar Spine Complete  03/21/2015   CLINICAL DATA:  Fall with right-sided pain. Diffuse thoracic and lumbar spine without radiation. Cannot bear weight on right lower extremity. Initial encounter.  EXAM: LUMBAR SPINE - COMPLETE 4+ VIEW  COMPARISON:  MR lumbar spine 04/18/2010.  FINDINGS: Numbering system utilized on 04/18/2010 is preserved. Alignment is anatomic. Vertebral body height is maintained. Endplate degenerative changes are seen at L5-S1 with associated loss of disc space height and facet hypertrophy. Mild anterior marginal osteophytosis at L3-4. No definite pars defects.  IMPRESSION: 1. No acute findings. 2. L5-S1 degenerative disc disease.   Electronically Signed   By: Lorin Picket M.D.   On: 03/21/2015 14:50   Dg Knee Complete 4 Views Right  03/21/2015   CLINICAL DATA:  Tripped and fell today with injury to right knee. Pain anterior knee with difficulty bearing weight.  EXAM: RIGHT KNEE - COMPLETE 4+ VIEW  COMPARISON:  05/02/2005  FINDINGS: There is no evidence of fracture, dislocation,  or joint effusion. There is no evidence of arthropathy or other focal bone abnormality. Soft tissues are unremarkable.  IMPRESSION: Negative.   Electronically Signed   By: Marin Olp M.D.   On: 03/21/2015 14:48     EKG Interpretation None     MDM   Final diagnoses:  Fall, initial encounter  Knee pain, acute, right  Shoulder pain, right  Back pain, chronic  Abrasion   Patient X-Ray negative for obvious fracture or dislocation. Pain managed in ED. Pt advised to follow up with orthopedics if symptoms persist for possibility of missed fracture diagnosis. Patient given brace while in ED, conservative therapy recommended and discussed. Patient will be dc home & is agreeable with above plan.  I personally performed the services described in this documentation, which was scribed in my presence. The recorded information has been reviewed and is accurate.  Margarita Mail, PA-C 03/21/15 [place], MD 03/22/15 [phone]

## 2015-03-27 ENCOUNTER — Encounter (HOSPITAL_COMMUNITY): Payer: Self-pay

## 2015-03-27 ENCOUNTER — Emergency Department (HOSPITAL_COMMUNITY): Payer: Medicare Other

## 2015-03-27 ENCOUNTER — Emergency Department (HOSPITAL_COMMUNITY)
Admission: EM | Admit: 2015-03-27 | Discharge: 2015-03-27 | Disposition: A | Discharge status: Home / Self Care | Payer: Medicare Other | Attending: Emergency Medicine | Admitting: Emergency Medicine

## 2015-03-27 DIAGNOSIS — R0789 Other chest pain: Secondary | ICD-10-CM | POA: Insufficient documentation

## 2015-03-27 DIAGNOSIS — Z720 Tobacco use: Secondary | ICD-10-CM | POA: Insufficient documentation

## 2015-03-27 DIAGNOSIS — I10 Essential (primary) hypertension: Secondary | ICD-10-CM | POA: Insufficient documentation

## 2015-03-27 DIAGNOSIS — E119 Type 2 diabetes mellitus without complications: Secondary | ICD-10-CM | POA: Insufficient documentation

## 2015-03-27 DIAGNOSIS — Z8709 Personal history of other diseases of the respiratory system: Secondary | ICD-10-CM | POA: Insufficient documentation

## 2015-03-27 DIAGNOSIS — G8929 Other chronic pain: Secondary | ICD-10-CM | POA: Diagnosis not present

## 2015-03-27 DIAGNOSIS — Z79899 Other long term (current) drug therapy: Secondary | ICD-10-CM | POA: Insufficient documentation

## 2015-03-27 DIAGNOSIS — K625 Hemorrhage of anus and rectum: Secondary | ICD-10-CM | POA: Insufficient documentation

## 2015-03-27 DIAGNOSIS — E079 Disorder of thyroid, unspecified: Secondary | ICD-10-CM | POA: Insufficient documentation

## 2015-03-27 DIAGNOSIS — R0781 Pleurodynia: Secondary | ICD-10-CM

## 2015-03-27 LAB — CBC
HCT: 40.4 % (ref 36.0–46.0)
Hemoglobin: 13.6 g/dL (ref 12.0–15.0)
MCH: 30.5 pg (ref 26.0–34.0)
MCHC: 33.7 g/dL (ref 30.0–36.0)
MCV: 90.6 fL (ref 78.0–100.0)
Platelets: 310 10*3/uL (ref 150–400)
RBC: 4.46 MIL/uL (ref 3.87–5.11)
RDW: 12.7 % (ref 11.5–15.5)
WBC: 5.1 10*3/uL (ref 4.0–10.5)

## 2015-03-27 LAB — COMPREHENSIVE METABOLIC PANEL
ALT: 13 U/L — ABNORMAL LOW (ref 14–54)
AST: 17 U/L (ref 15–41)
Albumin: 3.2 g/dL — ABNORMAL LOW (ref 3.5–5.0)
Alkaline Phosphatase: 68 U/L (ref 38–126)
Anion gap: 9 (ref 5–15)
BUN: 12 mg/dL (ref 6–20)
CO2: 26 mmol/L (ref 22–32)
Calcium: 9.1 mg/dL (ref 8.9–10.3)
Chloride: 107 mmol/L (ref 101–111)
Creatinine, Ser: 1.03 mg/dL — ABNORMAL HIGH (ref 0.44–1.00)
GFR calc Af Amer: >60 mL/min (ref >60)
GFR calc non Af Amer: 60 mL/min — ABNORMAL LOW (ref >60)
Glucose, Bld: 132 mg/dL — ABNORMAL HIGH (ref 65–99)
Potassium: 3.4 mmol/L — ABNORMAL LOW (ref 3.5–5.1)
Sodium: 142 mmol/L (ref 135–145)
Total Bilirubin: 0.5 mg/dL (ref 0.3–1.2)
Total Protein: 6.4 g/dL — ABNORMAL LOW (ref 6.5–8.1)

## 2015-03-27 LAB — TYPE AND SCREEN
ABO/RH(D): O POS
Antibody Screen: NEGATIVE

## 2015-03-27 LAB — ABO/RH: ABO/RH(D): O POS

## 2015-03-27 LAB — POC OCCULT BLOOD, ED: Fecal Occult Bld: NEGATIVE

## 2015-03-27 MED ORDER — DOCUSATE SODIUM 100 MG PO CAPS: 100.0000 mg | ORAL_CAPSULE | Freq: Two times a day (BID) | ORAL | Status: DC

## 2015-03-27 MED ORDER — OXYCODONE-ACETAMINOPHEN 5-325 MG PO TABS: 2.0000 | ORAL_TABLET | Freq: Four times a day (QID) | ORAL | Status: DC | PRN

## 2015-03-27 NOTE — Discharge Instructions (Signed)
Bloody Stools Bloody stools often mean that there is a problem in the digestive tract. Your caregiver may use the term "melena" to describe black, tarry, and bad smelling stools or "hematochezia" to describe red or maroon-colored stools. Blood seen in the stool can be caused by bleeding anywhere along the intestinal tract.  A black stool usually means that blood is coming from the upper part of the gastrointestinal tract (esophagus, stomach, or small bowel). Passing maroon-colored stools or bright red blood usually means that blood is coming from lower down in the large bowel or the rectum. However, sometimes massive bleeding in the stomach or small intestine can cause bright red bloody stools.  Consuming black licorice, lead, iron pills, medicines containing bismuth subsalicylate, or blueberries can also cause black stools. Your caregiver can test black stools to see if blood is present. It is important that the cause of the bleeding be found. Treatment can then be started, and the problem can be corrected. Rectal bleeding may not be serious, but you should not assume everything is okay until you know the cause.It is very important to follow up with your caregiver or a specialist in gastrointestinal problems. CAUSES  Blood in the stools can come from various underlying causes.Often, the cause is not found during your first visit. Testing is often needed to discover the cause of bleeding in the gastrointestinal tract. Causes range from simple to serious or even life-threatening.Possible causes include:  Hemorrhoids.These are veins that are full of blood (engorged) in the rectum. They cause pain, inflammation, and may bleed.  Anal fissures.These are areas of painful tearing which may bleed. They are often caused by passing hard stool.  Diverticulosis.These are pouches that form on the colon over time, with age, and may bleed significantly.  Diverticulitis.This is inflammation in areas with  diverticulosis. It can cause pain, fever, and bloody stools, although bleeding is rare.  Proctitis and colitis. These are inflamed areas of the rectum or colon. They may cause pain, fever, and bloody stools.  Polyps and cancer. Colon cancer is a leading cause of preventable cancer death.It often starts out as precancerous polyps that can be removed during a colonoscopy, preventing progression into cancer. Sometimes, polyps and cancer may cause rectal bleeding.  Gastritis and ulcers.Bleeding from the upper gastrointestinal tract (near the stomach) may travel through the intestines and produce black, sometimes tarry, often bad smelling stools. In certain cases, if the bleeding is fast enough, the stools may not be black, but red and the condition may be life-threatening. SYMPTOMS  You may have stools that are bright red and bloody, that are normal color with blood on them, or that are dark black and tarry. In some cases, you may only have blood in the toilet bowl. Any of these cases need medical care. You may also have:  Pain at the anus or anywhere in the rectum.  Lightheadedness or feeling faint.  Extreme weakness.  Nausea or vomiting.  Fever. DIAGNOSIS Your caregiver may use the following methods to find the cause of your bleeding:  Taking a medical history. Age is important. Older people tend to develop polyps and cancer more often. If there is anal pain and a hard, large stool associated with bleeding, a tear of the anus may be the cause. If blood drips into the toilet after a bowel movement, bleeding hemorrhoids may be the problem. The color and frequency of the bleeding are additional considerations. In most cases, the medical history provides clues, but seldom the final  answer.  A visual and finger (digital) exam. Your caregiver will inspect the anal area, looking for tears and hemorrhoids. A finger exam can provide information when there is tenderness or a growth inside. In men, the  prostate is also examined.  Endoscopy. Several types of small, long scopes (endoscopes) are used to view the colon.  In the office, your caregiver may use a rigid, or more commonly, a flexible viewing sigmoidoscope. This exam is called flexible sigmoidoscopy. It is performed in 5 to 10 minutes.  A more thorough exam is accomplished with a colonoscope. It allows your caregiver to view the entire 5 to 6 foot long colon. Medicine to help you relax (sedative) is usually given for this exam. Frequently, a bleeding lesion may be present beyond the reach of the sigmoidoscope. So, a colonoscopy may be the best exam to start with. Both exams are usually done on an outpatient basis. This means the patient does not stay overnight in the hospital or surgery center.  An upper endoscopy may be needed to examine your stomach. Sedation is used and a flexible endoscope is put in your mouth, down to your stomach.  A barium enema X-ray. This is an X-ray exam. It uses liquid barium inserted by enema into the rectum. This test alone may not identify an actual bleeding point. X-rays highlight abnormal shadows, such as those made by lumps (tumors), diverticuli, or colitis. TREATMENT  Treatment depends on the cause of your bleeding.   For bleeding from the stomach or colon, the caregiver doing your endoscopy or colonoscopy may be able to stop the bleeding as part of the procedure.  Inflammation or infection of the colon can be treated with medicines.  Many rectal problems can be treated with creams, suppositories, or warm baths.  Surgery is sometimes needed.  Blood transfusions are sometimes needed if you have lost a lot of blood.  For any bleeding problem, let your caregiver know if you take aspirin or other blood thinners regularly. HOME CARE INSTRUCTIONS   Take any medicines exactly as prescribed.  Keep your stools soft by eating a diet high in fiber. Prunes (1 to 3 a day) work well for many people.  Drink  enough water and fluids to keep your urine clear or pale yellow.  Take sitz baths if advised. A sitz bath is when you sit in a bathtub with warm water for 10 to 15 minutes to soak, soothe, and cleanse the rectal area.  If enemas or suppositories are advised, be sure you know how to use them. Tell your caregiver if you have problems with this.  Monitor your bowel movements to look for signs of improvement or worsening. SEEK MEDICAL CARE IF:   You do not improve in the time expected.  Your condition worsens after initial improvement.  You develop any new symptoms. SEEK IMMEDIATE MEDICAL CARE IF:   You develop severe or prolonged rectal bleeding.  You vomit blood.  You feel weak or faint.  You have a fever. MAKE SURE YOU:  Understand these instructions.  Will watch your condition.  Will get help right away if you are not doing well or get worse. Document Released: 10/03/2002 Document Revised: 01/05/2012 Document Reviewed: 02/28/2011 [facility] Patient Information 2015 [place], [place]. This information is not intended to replace advice given to you by your health care provider. Make sure you discuss any questions you have with your health care provider. Rib Contusion A rib contusion (bruise) can occur by a blow to the  chest or by a fall against a hard object. Usually these will be much better in a couple weeks. If X-rays were taken today and there are no broken bones (fractures), the diagnosis of bruising is made. However, broken ribs may not show up for several days, or may be discovered later on a routine X-ray when signs of healing show up. If this happens to you, it does not mean that something was missed on the X-ray, but simply that it did not show up on the first X-rays. Earlier diagnosis will not usually change the treatment. HOME CARE INSTRUCTIONS   Avoid strenuous activity. Be careful during activities and avoid bumping the injured ribs. Activities that pull on the injured  ribs and cause pain should be avoided, if possible.  For the first day or two, an ice pack used every 20 minutes while awake may be helpful. Put ice in a plastic bag and put a towel between the bag and the skin.  Eat a normal, well-balanced diet. Drink plenty of fluids to avoid constipation.  Take deep breaths several times a day to keep lungs free of infection. Try to cough several times a day. Splint the injured area with a pillow while coughing to ease pain. Coughing can help prevent pneumonia.  Wear a rib belt or binder only if told to do so by your caregiver. If you are wearing a rib belt or binder, you must do the breathing exercises as directed by your caregiver. If not used properly, rib belts or binders restrict breathing which can lead to pneumonia.  Only take over-the-counter or prescription medicines for pain, discomfort, or fever as directed by your caregiver. SEEK MEDICAL CARE IF:   You or your child has an oral temperature above 102 F (38.9 C).  Your baby is older than 3 months with a rectal temperature of 100.5 F (38.1 C) or higher for more than 1 day.  You develop a cough, with thick or bloody sputum. SEEK IMMEDIATE MEDICAL CARE IF:   You have difficulty breathing.  You feel sick to your stomach (nausea), have vomiting or belly (abdominal) pain.  You have worsening pain, not controlled with medications, or there is a change in the location of the pain.  You develop sweating or radiation of the pain into the arms, jaw or shoulders, or become light headed or faint.  You or your child has an oral temperature above 102 F (38.9 C), not controlled by medicine.  Your or your baby is older than 3 months with a rectal temperature of 102 F (38.9 C) or higher.  Your baby is 66 months old or younger with a rectal temperature of 100.4 F (38 C) or higher. MAKE SURE YOU:   Understand these instructions.  Will watch your condition.  Will get help right away if you are  not doing well or get worse. Document Released: 07/08/2001 Document Revised: 02/07/2013 Document Reviewed: 05/31/2008 [facility] Patient Information 2015 [place], [place]. This information is not intended to replace advice given to you by your health care provider. Make sure you discuss any questions you have with your health care provider.

## 2015-03-27 NOTE — ED Provider Notes (Signed)
CSN: [id number]     Arrival date & time 03/27/15  1115 History   First MD Initiated Contact with Patient 03/27/15 1231     Chief Complaint  Patient presents with  . Rectal Bleeding     (Consider location/radiation/quality/duration/timing/severity/associated sxs/prior Treatment) HPI Comments: Patient presents to the emergency department with chief complaint rectal bleeding. She states that she had 2 episodes of bright red bleeding yesterday. She has not had a bowel movement today. She states that she did have to strain to have her bowel movement yesterday. She has been taking Vicodin following a mechanical fall over the weekend. She complains of some persistent right rib pain from the fall, and would like to have an x-ray done of this. She denies any shortness of breath or chest pain. Denies any associated nausea, vomiting, or diarrhea.  The history is provided by the patient. No language interpreter was used.    Past Medical History  Diagnosis Date  . Diabetes mellitus   . Chronic back pain   . Hypertension   . Bronchitis   . Thyroid disease    Past Surgical History  Procedure Laterality Date  . Back surgery  1993  . Oophorectomy    . Cervical spine surgery Right 11/2012   Family History  Problem Relation Age of Onset  . Cystic fibrosis Sister   . Cancer Sister     ?  Marland Kitchen Heart disease Maternal Grandmother   . Clotting disorder Maternal Grandfather   . Diabetes Mother    History  Substance Use Topics  . Smoking status: Current Every Day Smoker -- 0.50 packs/day  . Smokeless tobacco: Never Used  . Alcohol Use: Yes     Comment: rarely   OB History    No data available     Review of Systems  Constitutional: Negative for fever and chills.  Respiratory: Negative for shortness of breath.   Cardiovascular: Negative for chest pain.  Gastrointestinal: Positive for anal bleeding. Negative for nausea, vomiting, diarrhea and constipation.  Genitourinary: Negative for dysuria.   All other systems reviewed and are negative.     Allergies  Sulfa antibiotics; Influenza vaccines; Lipitor; and Naproxen  Home Medications   Prior to Admission medications   Medication Sig Start Date End Date Taking? Authorizing Provider  acetaminophen (TYLENOL) 325 MG tablet Take 650 mg by mouth every 6 (six) hours as needed for mild pain.   Yes Historical Provider, MD  cyclobenzaprine (FLEXERIL) 10 MG tablet Take 1 tablet (10 mg total) by mouth 2 (two) times daily as needed for muscle spasms. 03/21/15  Yes Margarita Mail, PA-C  HYDROcodone-acetaminophen (NORCO/VICODIN) 5-325 MG per tablet Take 1 tablet by mouth 4 (four) times daily. 03/23/15  Yes Historical Provider, MD  levothyroxine (SYNTHROID, LEVOTHROID) 125 MCG tablet Take 125 mcg by mouth daily. 05/23/14  Yes Historical Provider, MD  lisinopril (PRINIVIL,ZESTRIL) 20 MG tablet Take 20 mg by mouth daily. 06/18/14  Yes Historical Provider, MD  LYRICA 75 MG capsule Take 75 mg by mouth daily. 04/26/14  Yes Historical Provider, MD  metFORMIN (GLUCOPHAGE-XR) 500 MG 24 hr tablet Take 500 mg by mouth daily. 12/04/14  Yes Historical Provider, MD  simvastatin (ZOCOR) 10 MG tablet Take 20 mg by mouth daily.    Yes Historical Provider, MD  sodium chloride (OCEAN) 0.65 % SOLN nasal spray Place 1 spray into both nostrils as needed for congestion. 01/11/15  Yes Annita Brod, MD  traMADol (ULTRAM) 50 MG tablet Take 1 tablet (50 mg total)  by mouth every 6 (six) hours as needed. 03/21/15  Yes Abigail Harris, PA-C  ZETIA 10 MG tablet Take 10 mg by mouth daily. 06/18/14  Yes Historical Provider, MD   BP 154/82 mmHg  Pulse 63  Temp(Src) 98.7 F (37.1 C) (Oral)  Resp 20  Ht 5\' 7"  (1.702 m)  Wt 198 lb 9.6 oz (90.084 kg)  BMI 31.10 kg/m2  SpO2 100% Physical Exam  Constitutional: She is oriented to person, place, and time. She appears well-developed and well-nourished.  HENT:  Head: Normocephalic and atraumatic.  Eyes: Conjunctivae and EOM are normal.  Pupils are equal, round, and reactive to light.  Neck: Normal range of motion. Neck supple.  Cardiovascular: Normal rate and regular rhythm.  Exam reveals no gallop and no friction rub.   No murmur heard. Pulmonary/Chest: Effort normal and breath sounds normal. No respiratory distress. She has no wheezes. She has no rales. She exhibits no tenderness.  Moderate right inferior rib pain, CTAB, no bruising  Abdominal: Soft. Bowel sounds are normal. She exhibits no distension and no mass. There is no tenderness. There is no rebound and no guarding.  No focal abdominal pain  Genitourinary:  Rectal exam chaperoned by female nurse, no gross blood per rectum, small non-bleeding hemorrhoid, empty rectal vault, normal rectal tone  Musculoskeletal: Normal range of motion. She exhibits no edema or tenderness.  Neurological: She is alert and oriented to person, place, and time.  Skin: Skin is warm and dry.  Psychiatric: She has a normal mood and affect. Her behavior is normal. Judgment and thought content normal.  Nursing note and vitals reviewed.   ED Course  Procedures (including critical care time) Results for orders placed or performed during the hospital encounter of 03/27/15  CBC  Result Value Ref Range   WBC 5.1 4.0 - 10.5 K/uL   RBC 4.46 3.87 - 5.11 MIL/uL   Hemoglobin 13.6 12.0 - 15.0 g/dL   HCT 40.4 36.0 - 46.0 %   MCV 90.6 78.0 - 100.0 fL   MCH 30.5 26.0 - 34.0 pg   MCHC 33.7 30.0 - 36.0 g/dL   RDW 12.7 11.5 - 15.5 %   Platelets 310 150 - 400 K/uL  Comprehensive metabolic panel  Result Value Ref Range   Sodium 142 135 - 145 mmol/L   Potassium 3.4 (L) 3.5 - 5.1 mmol/L   Chloride 107 101 - 111 mmol/L   CO2 26 22 - 32 mmol/L   Glucose, Bld 132 (H) 65 - 99 mg/dL   BUN 12 6 - 20 mg/dL   Creatinine, Ser 1.03 (H) 0.44 - 1.00 mg/dL   Calcium 9.1 8.9 - 10.3 mg/dL   Total Protein 6.4 (L) 6.5 - 8.1 g/dL   Albumin 3.2 (L) 3.5 - 5.0 g/dL   AST 17 15 - 41 U/L   ALT 13 (L) 14 - 54 U/L    Alkaline Phosphatase 68 38 - 126 U/L   Total Bilirubin 0.5 0.3 - 1.2 mg/dL   GFR calc non Af Amer 60 (L) >60 mL/min   GFR calc Af Amer >60 >60 mL/min   Anion gap 9 5 - 15  POC occult blood, ED  Result Value Ref Range   Fecal Occult Bld NEGATIVE NEGATIVE  Type and screen  Result Value Ref Range   ABO/RH(D) O POS    Antibody Screen NEG    Sample Expiration 03/30/2015   ABO/Rh  Result Value Ref Range   ABO/RH(D) O POS    Dg  Ribs Unilateral W/chest Right  03/27/2015   CLINICAL DATA:  Status post fall 03/21/2015. Right rib pain. Initial encounter.  EXAM: RIGHT RIBS AND CHEST - 3+ VIEW  COMPARISON:  PA and lateral chest 01/10/2015.  FINDINGS: The lungs are clear. Heart size is normal. No pneumothorax or pleural effusion. No rib fracture is identified. Postoperative change of cervical fusion is noted.  IMPRESSION: Negative for fracture.  Negative chest.   Electronically Signed   By: Inge Rise M.D.   On: 03/27/2015 15:11   Dg Thoracic Spine 2 View  03/21/2015   CLINICAL DATA:  Pain after fall  EXAM: THORACIC SPINE - 3 VIEW  COMPARISON:  Chest radiograph January 10, 2015  FINDINGS: Frontal, lateral, and swimmer's views were obtained. There is no fracture or spondylolisthesis. There is mild disc space narrowing at several levels. There are anterior and lateral osteophytes at several levels in the mid to lower thoracic spine. No erosive change. There is postoperative change anteriorly in the lower cervical region.  IMPRESSION: Areas of relatively mild osteoarthritic change. No fracture or spondylolisthesis. Postoperative change anteriorly in the cervical spine region.   Electronically Signed   By: Lowella Grip III M.D.   On: 03/21/2015 14:50   Dg Lumbar Spine Complete  03/21/2015   CLINICAL DATA:  Fall with right-sided pain. Diffuse thoracic and lumbar spine without radiation. Cannot bear weight on right lower extremity. Initial encounter.  EXAM: LUMBAR SPINE - COMPLETE 4+ VIEW  COMPARISON:   MR lumbar spine 04/18/2010.  FINDINGS: Numbering system utilized on 04/18/2010 is preserved. Alignment is anatomic. Vertebral body height is maintained. Endplate degenerative changes are seen at L5-S1 with associated loss of disc space height and facet hypertrophy. Mild anterior marginal osteophytosis at L3-4. No definite pars defects.  IMPRESSION: 1. No acute findings. 2. L5-S1 degenerative disc disease.   Electronically Signed   By: Lorin Picket M.D.   On: 03/21/2015 14:50   Dg Knee Complete 4 Views Right  03/21/2015   CLINICAL DATA:  Tripped and fell today with injury to right knee. Pain anterior knee with difficulty bearing weight.  EXAM: RIGHT KNEE - COMPLETE 4+ VIEW  COMPARISON:  05/02/2005  FINDINGS: There is no evidence of fracture, dislocation, or joint effusion. There is no evidence of arthropathy or other focal bone abnormality. Soft tissues are unremarkable.  IMPRESSION: Negative.   Electronically Signed   By: Marin Olp M.D.   On: 03/21/2015 14:48      EKG Interpretation None      MDM   Final diagnoses:  Rib pain  Rectal bleeding    Patient with rectal bleeding yesterday.  It was bright red blood.  Has had straining with BMs likely 2/2 taking norco.    Rectal exam is remarkable for a small hemorrhoid, non-bleeding at this time, no gross blood on exam, FOBT is negative.  Also having right rib pain.  Will check CXR/with ribs per request, but don't change in management of symptoms.  3:45 PM Patient reassessed, she states that she is feeling better. Chest and rib films are negative.    H/H is normal.  VSS.  Patient not in any distress.  Plan for discharge to home with PCP follow-up.  No bleeding on rectal exam.  FOBT is negative.    Montine Circle, PA-C 03/27/15 [place], MD 03/27/15 [phone]

## 2015-03-27 NOTE — ED Notes (Signed)
Pt started having rectal bleeding yesterday. States it has been dark red and bright red. No hx of hemorrhoids. States she is still having some pain from a fall earlier this week.

## 2015-04-23 ENCOUNTER — Other Ambulatory Visit: Payer: Self-pay

## 2015-07-09 ENCOUNTER — Encounter (HOSPITAL_COMMUNITY): Payer: Self-pay | Admitting: Emergency Medicine

## 2015-07-09 ENCOUNTER — Emergency Department (HOSPITAL_COMMUNITY)
Admission: EM | Admit: 2015-07-09 | Discharge: 2015-07-09 | Disposition: A | Discharge status: Home / Self Care | Payer: Medicare Other | Attending: Physician Assistant | Admitting: Physician Assistant

## 2015-07-09 DIAGNOSIS — Z79899 Other long term (current) drug therapy: Secondary | ICD-10-CM | POA: Diagnosis not present

## 2015-07-09 DIAGNOSIS — Z720 Tobacco use: Secondary | ICD-10-CM | POA: Diagnosis not present

## 2015-07-09 DIAGNOSIS — E079 Disorder of thyroid, unspecified: Secondary | ICD-10-CM | POA: Insufficient documentation

## 2015-07-09 DIAGNOSIS — G8929 Other chronic pain: Secondary | ICD-10-CM | POA: Insufficient documentation

## 2015-07-09 DIAGNOSIS — J029 Acute pharyngitis, unspecified: Secondary | ICD-10-CM | POA: Diagnosis present

## 2015-07-09 DIAGNOSIS — E119 Type 2 diabetes mellitus without complications: Secondary | ICD-10-CM | POA: Insufficient documentation

## 2015-07-09 DIAGNOSIS — I10 Essential (primary) hypertension: Secondary | ICD-10-CM | POA: Diagnosis not present

## 2015-07-09 DIAGNOSIS — H6692 Otitis media, unspecified, left ear: Secondary | ICD-10-CM | POA: Insufficient documentation

## 2015-07-09 MED ORDER — CYCLOBENZAPRINE HCL 10 MG PO TABS: 10.0000 mg | ORAL_TABLET | Freq: Two times a day (BID) | ORAL | Status: DC | PRN

## 2015-07-09 MED ORDER — AMOXICILLIN 500 MG PO CAPS
500.0000 mg | ORAL_CAPSULE | Freq: Once | ORAL | Status: AC
  Administered 2015-07-09: 500 mg via ORAL
  Filled 2015-07-09: qty 1

## 2015-07-09 MED ORDER — AMOXICILLIN 500 MG PO CAPS: 500.0000 mg | ORAL_CAPSULE | Freq: Three times a day (TID) | ORAL | Status: DC

## 2015-07-09 MED ORDER — CYCLOBENZAPRINE HCL 10 MG PO TABS
10.0000 mg | ORAL_TABLET | Freq: Once | ORAL | Status: AC
  Administered 2015-07-09: 10 mg via ORAL
  Filled 2015-07-09: qty 1

## 2015-07-09 MED ORDER — METHOCARBAMOL 500 MG PO TABS
750.0000 mg | ORAL_TABLET | Freq: Once | ORAL | Status: AC
  Administered 2015-07-09: 750 mg via ORAL
  Filled 2015-07-09: qty 2

## 2015-07-09 NOTE — ED Notes (Signed)
Patient states she had an ear ache last night, but now is sore throat.  Patient states nausea, but no vomiting.  Patient also complains of mid back pain.  Patient states no appetite.   Patient also having muscle spasms all over.

## 2015-07-09 NOTE — ED Provider Notes (Signed)
CSN: [id number]     Arrival date & time 07/09/15  0847 History   First MD Initiated Contact with Patient 07/09/15 1028     Chief Complaint  Patient presents with  . Sore Throat  . Back Pain  . Generalized Body Aches     (Consider location/radiation/quality/duration/timing/severity/associated sxs/prior Treatment) HPI   Patient is a 54 year old female presenting with multiple complaints.  She has acute on chronic back pain. Patient complaining of muscle spasm in her back. She's had this prior. Shes had multiple back surgeries. No new symptoms. No red flags. No trouble walking. She's been working with minimal pain on her bilateral paraspinal.  Past Medical History  Diagnosis Date  . Diabetes mellitus   . Chronic back pain   . Hypertension   . Bronchitis   . Thyroid disease    Past Surgical History  Procedure Laterality Date  . Back surgery  1993  . Oophorectomy    . Cervical spine surgery Right 11/2012   Family History  Problem Relation Age of Onset  . Cystic fibrosis Sister   . Cancer Sister     ?  Marland Kitchen Heart disease Maternal Grandmother   . Clotting disorder Maternal Grandfather   . Diabetes Mother    Social History  Substance Use Topics  . Smoking status: Current Every Day Smoker -- 0.50 packs/day    Types: Cigarettes  . Smokeless tobacco: Never Used  . Alcohol Use: Yes     Comment: rarely   OB History    No data available     Review of Systems  Constitutional: Negative for activity change and fatigue.  HENT: Positive for ear pain and sore throat. Negative for congestion and drooling.   Eyes: Negative for discharge.  Respiratory: Negative for cough and chest tightness.   Cardiovascular: Negative for chest pain.  Gastrointestinal: Negative for abdominal distention.  Genitourinary: Negative for dysuria and difficulty urinating.  Musculoskeletal: Positive for back pain.  Skin: Negative for rash.  Allergic/Immunologic: Negative for immunocompromised state.   Neurological: Negative for seizures and speech difficulty.  Psychiatric/Behavioral: Negative for behavioral problems and agitation.      Allergies  Sulfa antibiotics; Influenza vaccines; Lipitor; and Naproxen  Home Medications   Prior to Admission medications   Medication Sig Start Date End Date Taking? Authorizing Provider  acetaminophen (TYLENOL) 325 MG tablet Take 650 mg by mouth every 6 (six) hours as needed for mild pain.   Yes Historical Provider, MD  levothyroxine (SYNTHROID, LEVOTHROID) 125 MCG tablet Take 125 mcg by mouth daily. 05/23/14  Yes Historical Provider, MD  lisinopril (PRINIVIL,ZESTRIL) 20 MG tablet Take 20 mg by mouth daily. 06/18/14  Yes Historical Provider, MD  metFORMIN (GLUCOPHAGE-XR) 500 MG 24 hr tablet Take 500 mg by mouth daily. 12/04/14  Yes Historical Provider, MD  simvastatin (ZOCOR) 10 MG tablet Take 20 mg by mouth daily.    Yes Historical Provider, MD  ZETIA 10 MG tablet Take 10 mg by mouth daily. 06/18/14  Yes Historical Provider, MD  cyclobenzaprine (FLEXERIL) 10 MG tablet Take 1 tablet (10 mg total) by mouth 2 (two) times daily as needed for muscle spasms. Patient not taking: Reported on 07/09/2015 03/21/15   Margarita Mail, PA-C  docusate sodium (COLACE) 100 MG capsule Take 1 capsule (100 mg total) by mouth every 12 (twelve) hours. Patient not taking: Reported on 07/09/2015 03/27/15   Montine Circle, PA-C  HYDROcodone-acetaminophen (NORCO/VICODIN) 5-325 MG per tablet Take 1 tablet by mouth 4 (four) times daily. 03/23/15  Historical Provider, MD  LYRICA 75 MG capsule Take 75 mg by mouth daily. 04/26/14   Historical Provider, MD  oxyCODONE-acetaminophen (PERCOCET/ROXICET) 5-325 MG per tablet Take 2 tablets by mouth every 6 (six) hours as needed for severe pain. Patient not taking: Reported on 07/09/2015 03/27/15   Montine Circle, PA-C  sodium chloride (OCEAN) 0.65 % SOLN nasal spray Place 1 spray into both nostrils as needed for congestion. Patient not taking:  Reported on 07/09/2015 01/11/15   Annita Brod, MD  traMADol (ULTRAM) 50 MG tablet Take 1 tablet (50 mg total) by mouth every 6 (six) hours as needed. Patient not taking: Reported on 07/09/2015 03/21/15   Margarita Mail, PA-C   BP 133/72 mmHg  Pulse 56  Temp(Src) 98 F (36.7 C) (Oral)  Resp 18  Ht 5\' 7"  (1.702 m)  Wt 198 lb (89.812 kg)  BMI 31.00 kg/m2  SpO2 99% Physical Exam  Constitutional: She is oriented to person, place, and time. She appears well-developed and well-nourished.  HENT:  Head: Normocephalic and atraumatic.  Left year with fullness, opacity.  Mild erythema to throat  Eyes: Conjunctivae are normal. Right eye exhibits no discharge.  Neck: Neck supple.  Cardiovascular: Normal rate, regular rhythm and normal heart sounds.   No murmur heard. Pulmonary/Chest: Effort normal and breath sounds normal. She has no wheezes. She has no rales.  Abdominal: Soft. She exhibits no distension. There is no tenderness.  Musculoskeletal: Normal range of motion. She exhibits no edema.  Muscle spasm to bilateral lumbar pain. No pain over C,T or L-spine midline.  Neurological: She is oriented to person, place, and time. No cranial nerve deficit.  Skin: Skin is warm and dry. No rash noted. She is not diaphoretic.  Psychiatric: She has a normal mood and affect. Her behavior is normal.  Nursing note and vitals reviewed.   ED Course  Procedures (including critical care time) Labs Review Labs Reviewed - No data to display  Imaging Review No results found. I have personally reviewed and evaluated these images and lab results as part of my medical decision-making.   EKG Interpretation None      MDM   Final diagnoses:  None   patient is a 34 her old female presenting with bilateral back spasm pain. She's had this chronically on and off. She also reports that she has ear pain and sore throat that started 2 days ago. Patient has mild erythema to the posterior pharynx and passively  of the left TM. We'll treat with amoxicillin. We'll give her Robaxin and Flexeril for the back pain/spasm. No red flags.     Soumya Colson Julio Alm, MD 07/09/15 1051

## 2015-07-20 ENCOUNTER — Emergency Department (HOSPITAL_COMMUNITY)
Admission: EM | Admit: 2015-07-20 | Discharge: 2015-07-20 | Disposition: A | Discharge status: Home / Self Care | Payer: Medicare Other | Attending: Emergency Medicine | Admitting: Emergency Medicine

## 2015-07-20 ENCOUNTER — Emergency Department (HOSPITAL_COMMUNITY): Payer: Medicare Other

## 2015-07-20 ENCOUNTER — Encounter (HOSPITAL_COMMUNITY): Payer: Self-pay | Admitting: *Deleted

## 2015-07-20 DIAGNOSIS — I10 Essential (primary) hypertension: Secondary | ICD-10-CM | POA: Insufficient documentation

## 2015-07-20 DIAGNOSIS — E119 Type 2 diabetes mellitus without complications: Secondary | ICD-10-CM | POA: Insufficient documentation

## 2015-07-20 DIAGNOSIS — M1100 Hydroxyapatite deposition disease, unspecified site: Secondary | ICD-10-CM | POA: Diagnosis not present

## 2015-07-20 DIAGNOSIS — M79644 Pain in right finger(s): Secondary | ICD-10-CM | POA: Diagnosis present

## 2015-07-20 DIAGNOSIS — Z720 Tobacco use: Secondary | ICD-10-CM | POA: Diagnosis not present

## 2015-07-20 DIAGNOSIS — G8929 Other chronic pain: Secondary | ICD-10-CM | POA: Insufficient documentation

## 2015-07-20 DIAGNOSIS — Z79899 Other long term (current) drug therapy: Secondary | ICD-10-CM | POA: Insufficient documentation

## 2015-07-20 DIAGNOSIS — E079 Disorder of thyroid, unspecified: Secondary | ICD-10-CM | POA: Diagnosis not present

## 2015-07-20 DIAGNOSIS — M1120 Other chondrocalcinosis, unspecified site: Secondary | ICD-10-CM

## 2015-07-20 MED ORDER — HYDROCODONE-ACETAMINOPHEN 5-325 MG PO TABS
1.0000 | ORAL_TABLET | Freq: Four times a day (QID) | ORAL | Status: DC | PRN
Start: 2015-07-20 — End: 2016-01-10

## 2015-07-20 MED ORDER — IBUPROFEN 600 MG PO TABS: 600.0000 mg | ORAL_TABLET | Freq: Four times a day (QID) | ORAL | Status: DC | PRN

## 2015-07-20 NOTE — ED Provider Notes (Signed)
CSN: [id number]     Arrival date & time 07/20/15  6195 History  This chart was scribed for non-physician practitioner Montine Circle, PA-C, working with Lacretia Leigh, MD, by Eustaquio Maize, ED Scribe. This patient was seen in room TR10C/TR10C and the patient's care was started at 10:15 AM.  Chief Complaint  Patient presents with  . Finger Injury   The history is provided by the patient. No language interpreter was used.     HPI Comments: Margaret Webster is a 54 y.o. female with hx DM who presents to the Emergency Department complaining of gradual onset, constant, 6/10, burning pain and swelling to right middle finger x 1 day. She notes there is a knot to the side of her finger as well. She denies fever, chills, drainage, or any other associated symptoms.    Past Medical History  Diagnosis Date  . Diabetes mellitus   . Chronic back pain   . Hypertension   . Bronchitis   . Thyroid disease    Past Surgical History  Procedure Laterality Date  . Back surgery  1993  . Oophorectomy    . Cervical spine surgery Right 11/2012   Family History  Problem Relation Age of Onset  . Cystic fibrosis Sister   . Cancer Sister     ?  Marland Kitchen Heart disease Maternal Grandmother   . Clotting disorder Maternal Grandfather   . Diabetes Mother    Social History  Substance Use Topics  . Smoking status: Current Every Day Smoker -- 0.50 packs/day    Types: Cigarettes  . Smokeless tobacco: Never Used  . Alcohol Use: Yes     Comment: rarely   OB History    No data available     Review of Systems  Constitutional: Negative for fever and chills.  Musculoskeletal: Positive for joint swelling and arthralgias (Right middle finger pain).  Skin: Negative for wound.  Neurological: Negative for weakness and numbness.      Allergies  Sulfa antibiotics; Influenza vaccines; Lipitor; and Naproxen  Home Medications   Prior to Admission medications   Medication Sig Start Date End Date Taking? Authorizing  Provider  acetaminophen (TYLENOL) 325 MG tablet Take 650 mg by mouth every 6 (six) hours as needed for mild pain.    Historical Provider, MD  amoxicillin (AMOXIL) 500 MG capsule Take 1 capsule (500 mg total) by mouth 3 (three) times daily. 07/09/15   Courteney Lyn Mackuen, MD  cyclobenzaprine (FLEXERIL) 10 MG tablet Take 1 tablet (10 mg total) by mouth 2 (two) times daily as needed for muscle spasms. Patient not taking: Reported on 07/09/2015 03/21/15   Margarita Mail, PA-C  cyclobenzaprine (FLEXERIL) 10 MG tablet Take 1 tablet (10 mg total) by mouth 2 (two) times daily as needed for muscle spasms. 07/09/15   Courteney Lyn Mackuen, MD  docusate sodium (COLACE) 100 MG capsule Take 1 capsule (100 mg total) by mouth every 12 (twelve) hours. Patient not taking: Reported on 07/09/2015 03/27/15   Montine Circle, PA-C  HYDROcodone-acetaminophen (NORCO/VICODIN) 5-325 MG per tablet Take 1 tablet by mouth 4 (four) times daily. 03/23/15   Historical Provider, MD  levothyroxine (SYNTHROID, LEVOTHROID) 125 MCG tablet Take 125 mcg by mouth daily. 05/23/14   Historical Provider, MD  lisinopril (PRINIVIL,ZESTRIL) 20 MG tablet Take 20 mg by mouth daily. 06/18/14   Historical Provider, MD  LYRICA 75 MG capsule Take 75 mg by mouth daily. 04/26/14   Historical Provider, MD  metFORMIN (GLUCOPHAGE-XR) 500 MG 24 hr tablet Take  500 mg by mouth daily. 12/04/14   Historical Provider, MD  oxyCODONE-acetaminophen (PERCOCET/ROXICET) 5-325 MG per tablet Take 2 tablets by mouth every 6 (six) hours as needed for severe pain. Patient not taking: Reported on 07/09/2015 03/27/15   Montine Circle, PA-C  simvastatin (ZOCOR) 10 MG tablet Take 20 mg by mouth daily.     Historical Provider, MD  sodium chloride (OCEAN) 0.65 % SOLN nasal spray Place 1 spray into both nostrils as needed for congestion. Patient not taking: Reported on 07/09/2015 01/11/15   Annita Brod, MD  traMADol (ULTRAM) 50 MG tablet Take 1 tablet (50 mg total) by mouth every 6  (six) hours as needed. Patient not taking: Reported on 07/09/2015 03/21/15   Margarita Mail, PA-C  ZETIA 10 MG tablet Take 10 mg by mouth daily. 06/18/14   Historical Provider, MD   Triage Vitals: BP 129/74 mmHg  Pulse 71  Temp(Src) 97.6 F (36.4 C) (Oral)  Resp 16  SpO2 100%   Physical Exam  Constitutional: She is oriented to person, place, and time. She appears well-developed and well-nourished. No distress.  HENT:  Head: Normocephalic and atraumatic.  Eyes: Conjunctivae and EOM are normal.  Neck: Neck supple. No tracheal deviation present.  Cardiovascular: Normal rate.   Pulmonary/Chest: Effort normal. No respiratory distress.  Musculoskeletal: Normal range of motion.  Palmar aspect of right middle finger DIP moderately tender to palpation, no bony abnormality or deformity  Neurological: She is alert and oriented to person, place, and time.  Skin: Skin is warm and dry.  No erythema, or evidence of felon or paronychia  Psychiatric: She has a normal mood and affect. Her behavior is normal.  Nursing note and vitals reviewed.   ED Course  Procedures (including critical care time)  DIAGNOSTIC STUDIES: Oxygen Saturation is 100% on RA, normal by my interpretation.    COORDINATION OF CARE: 10:18 AM-Discussed treatment plan which includes DG R Middle Fnger with pt at bedside and pt agreed to plan.   Labs Review Labs Reviewed - No data to display  Imaging Review Dg Finger Middle Right  07/20/2015   CLINICAL DATA:  Pain in the distal interphalangeal joint of the middle finger for 1 day. No known injury. Initial encounter.  EXAM: RIGHT MIDDLE FINGER 2+V  COMPARISON:  None.  FINDINGS: The mineralization and alignment are normal. There is mild distal and proximal interphalangeal joint space loss. No erosive changes are identified. There are 3 ossific/ calcific densities along the palmar aspect of the distal interphalangeal joint, best seen on the sagittal images. There may be mild soft  tissue swelling in the middle finger. No foreign body or soft tissue emphysema.  IMPRESSION: Ossific/ calcific densities along the palmar aspect of the third DIP joint, favored to reflect hydroxyapatite deposition. Loose bodies considered less likely. Mild underlying degenerative changes without evidence of inflammatory arthropathy.   Electronically Signed   By: Richardean Sale M.D.   On: 07/20/2015 10:35   I have personally reviewed and evaluated these images as part of my medical decision-making.   EKG Interpretation None      MDM   Final diagnoses:  Hydroxyapatite arthropathy   Patient with right middle finger pain. No evidence of felon or paronychia, no recent injuries. Plain films remarkable for hydroxyapatite depositions. Will treat with finger splint. Patient discussed with Dr. Zenia Resides. Recommend hand follow-up. Patient agrees the plan. She is stable and ready for discharge.  I personally performed the services described in this documentation, which was scribed  in my presence. The recorded information has been reviewed and is accurate.       Montine Circle, PA-C 07/21/15 1654  Lacretia Leigh, MD 07/24/15 1247

## 2015-07-20 NOTE — ED Notes (Signed)
Pt states that she has pain and a knot to her rt middle finger.

## 2015-07-20 NOTE — Discharge Instructions (Signed)
You have been diagnosed with HYDROXYAPATITE DEPOSITIONS in your right middle finger. Please follow-up with the hand specialist as directed.  Please take ibuprofen and apply ice and heat alternating.

## 2015-07-26 DIAGNOSIS — M25849 Other specified joint disorders, unspecified hand: Secondary | ICD-10-CM | POA: Insufficient documentation

## 2015-08-12 ENCOUNTER — Emergency Department (HOSPITAL_COMMUNITY): Payer: Medicare Other

## 2015-08-12 ENCOUNTER — Encounter (HOSPITAL_COMMUNITY): Payer: Self-pay | Admitting: *Deleted

## 2015-08-12 ENCOUNTER — Emergency Department (HOSPITAL_COMMUNITY)
Admission: EM | Admit: 2015-08-12 | Discharge: 2015-08-12 | Disposition: A | Discharge status: Home / Self Care | Payer: Medicare Other | Attending: Emergency Medicine | Admitting: Emergency Medicine

## 2015-08-12 DIAGNOSIS — Z792 Long term (current) use of antibiotics: Secondary | ICD-10-CM | POA: Insufficient documentation

## 2015-08-12 DIAGNOSIS — Z8709 Personal history of other diseases of the respiratory system: Secondary | ICD-10-CM | POA: Diagnosis not present

## 2015-08-12 DIAGNOSIS — G8929 Other chronic pain: Secondary | ICD-10-CM | POA: Insufficient documentation

## 2015-08-12 DIAGNOSIS — R1032 Left lower quadrant pain: Secondary | ICD-10-CM | POA: Insufficient documentation

## 2015-08-12 DIAGNOSIS — R51 Headache: Secondary | ICD-10-CM | POA: Diagnosis not present

## 2015-08-12 DIAGNOSIS — I10 Essential (primary) hypertension: Secondary | ICD-10-CM | POA: Diagnosis not present

## 2015-08-12 DIAGNOSIS — Z720 Tobacco use: Secondary | ICD-10-CM | POA: Insufficient documentation

## 2015-08-12 DIAGNOSIS — E079 Disorder of thyroid, unspecified: Secondary | ICD-10-CM | POA: Diagnosis not present

## 2015-08-12 DIAGNOSIS — H9202 Otalgia, left ear: Secondary | ICD-10-CM | POA: Insufficient documentation

## 2015-08-12 DIAGNOSIS — Z79899 Other long term (current) drug therapy: Secondary | ICD-10-CM | POA: Diagnosis not present

## 2015-08-12 DIAGNOSIS — R109 Unspecified abdominal pain: Secondary | ICD-10-CM

## 2015-08-12 DIAGNOSIS — R1013 Epigastric pain: Secondary | ICD-10-CM | POA: Diagnosis not present

## 2015-08-12 DIAGNOSIS — E119 Type 2 diabetes mellitus without complications: Secondary | ICD-10-CM | POA: Diagnosis not present

## 2015-08-12 DIAGNOSIS — R519 Headache, unspecified: Secondary | ICD-10-CM

## 2015-08-12 DIAGNOSIS — R1012 Left upper quadrant pain: Secondary | ICD-10-CM | POA: Insufficient documentation

## 2015-08-12 LAB — URINALYSIS, ROUTINE W REFLEX MICROSCOPIC
Bilirubin Urine: NEGATIVE
Glucose, UA: NEGATIVE mg/dL
Hgb urine dipstick: NEGATIVE
Ketones, ur: NEGATIVE mg/dL
Leukocytes, UA: NEGATIVE
Nitrite: NEGATIVE
Protein, ur: 100 mg/dL — AB
Specific Gravity, Urine: 1.005 (ref 1.005–1.030)
Urobilinogen, UA: 0.2 mg/dL (ref 0.0–1.0)
pH: 6.5 (ref 5.0–8.0)

## 2015-08-12 LAB — URINE MICROSCOPIC-ADD ON

## 2015-08-12 LAB — CBC
HCT: 40.9 % (ref 36.0–46.0)
Hemoglobin: 14.1 g/dL (ref 12.0–15.0)
MCH: 31.1 pg (ref 26.0–34.0)
MCHC: 34.5 g/dL (ref 30.0–36.0)
MCV: 90.3 fL (ref 78.0–100.0)
Platelets: 236 10*3/uL (ref 150–400)
RBC: 4.53 MIL/uL (ref 3.87–5.11)
RDW: 12.9 % (ref 11.5–15.5)
WBC: 6.5 10*3/uL (ref 4.0–10.5)

## 2015-08-12 LAB — COMPREHENSIVE METABOLIC PANEL
ALT: 15 U/L (ref 14–54)
AST: 22 U/L (ref 15–41)
Albumin: 3.6 g/dL (ref 3.5–5.0)
Alkaline Phosphatase: 69 U/L (ref 38–126)
Anion gap: 8 (ref 5–15)
BUN: 10 mg/dL (ref 6–20)
CO2: 25 mmol/L (ref 22–32)
Calcium: 9.5 mg/dL (ref 8.9–10.3)
Chloride: 105 mmol/L (ref 101–111)
Creatinine, Ser: 1.06 mg/dL — ABNORMAL HIGH (ref 0.44–1.00)
GFR calc Af Amer: >60 mL/min (ref >60)
GFR calc non Af Amer: 58 mL/min — ABNORMAL LOW (ref >60)
Glucose, Bld: 133 mg/dL — ABNORMAL HIGH (ref 65–99)
Potassium: 4.3 mmol/L (ref 3.5–5.1)
Sodium: 138 mmol/L (ref 135–145)
Total Bilirubin: 0.8 mg/dL (ref 0.3–1.2)
Total Protein: 6.8 g/dL (ref 6.5–8.1)

## 2015-08-12 LAB — SEDIMENTATION RATE: Sed Rate: 30 mm/hr — ABNORMAL HIGH (ref 0–22)

## 2015-08-12 LAB — LIPASE, BLOOD: Lipase: 29 U/L (ref 22–51)

## 2015-08-12 MED ORDER — ONDANSETRON HCL 4 MG PO TABS: 4.0000 mg | ORAL_TABLET | Freq: Three times a day (TID) | ORAL | Status: DC | PRN

## 2015-08-12 MED ORDER — SODIUM CHLORIDE 0.9 % IV BOLUS (SEPSIS)
1000.0000 mL | Freq: Once | INTRAVENOUS | Status: AC
  Administered 2015-08-12: 1000 mL via INTRAVENOUS

## 2015-08-12 MED ORDER — OMEPRAZOLE 20 MG PO CPDR: 20.0000 mg | DELAYED_RELEASE_CAPSULE | Freq: Every day | ORAL | Status: DC

## 2015-08-12 MED ORDER — HYDROMORPHONE HCL 1 MG/ML IJ SOLN
1.0000 mg | Freq: Once | INTRAMUSCULAR | Status: AC
  Administered 2015-08-12: 1 mg via INTRAVENOUS
  Filled 2015-08-12: qty 1

## 2015-08-12 MED ORDER — NEOMYCIN-POLYMYXIN-HC 3.5-10000-1 OT SUSP: 4.0000 [drp] | Freq: Four times a day (QID) | OTIC | Status: DC

## 2015-08-12 MED ORDER — IOHEXOL 300 MG/ML  SOLN
100.0000 mL | Freq: Once | INTRAMUSCULAR | Status: AC | PRN
  Administered 2015-08-12: 100 mL via INTRAVENOUS

## 2015-08-12 MED ORDER — ONDANSETRON HCL 4 MG/2ML IJ SOLN
4.0000 mg | Freq: Once | INTRAMUSCULAR | Status: AC
  Administered 2015-08-12: 4 mg via INTRAVENOUS
  Filled 2015-08-12: qty 2

## 2015-08-12 MED ORDER — SODIUM CHLORIDE 0.9 % IV SOLN
1.0000 g | Freq: Once | INTRAVENOUS | Status: DC
  Filled 2015-08-12: qty 10

## 2015-08-12 NOTE — ED Provider Notes (Signed)
CSN: [id number]     Arrival date & time 08/12/15  1217 History   First MD Initiated Contact with Patient 08/12/15 1252     Chief Complaint  Patient presents with  . Abdominal Pain     (Consider location/radiation/quality/duration/timing/severity/associated sxs/prior Treatment) HPI   Pt has two complaints.    Pt with hx DM, HTN p/w 12 days of left ear pain causing left sided headache.  Has had recurrent ear infections in that ear.  Was recently placed on Levaquin by PCP Dr Jonelle Sidle but did not feel it was helping and stopped it after 5 days.   Is sensitive to light.   Does have hx migraines but feels this is coming from her ear.   She has also had 8 days of abdominal pain with increased belching, N/V, not tolerating PO.  Pain is worse on the left side, exacerbated by eating.  Emesis is described as "slime."  Was constipated had BM yesterday with help of laxative.  Denies urinary or vaginal symptoms.  Denies CP, SOB, cough.   Hx abdominal surgery: oophorectomy for cyst.    Past Medical History  Diagnosis Date  . Diabetes mellitus   . Chronic back pain   . Hypertension   . Bronchitis   . Thyroid disease    Past Surgical History  Procedure Laterality Date  . Back surgery  1993  . Oophorectomy    . Cervical spine surgery Right 11/2012   Family History  Problem Relation Age of Onset  . Cystic fibrosis Sister   . Cancer Sister     ?  Marland Kitchen Heart disease Maternal Grandmother   . Clotting disorder Maternal Grandfather   . Diabetes Mother    Social History  Substance Use Topics  . Smoking status: Current Every Day Smoker -- 0.50 packs/day    Types: Cigarettes  . Smokeless tobacco: Never Used  . Alcohol Use: Yes     Comment: rarely   OB History    No data available     Review of Systems  All other systems reviewed and are negative.     Allergies  Sulfa antibiotics; Influenza vaccines; Lipitor; and Naproxen  Home Medications   Prior to Admission medications     Medication Sig Start Date End Date Taking? Authorizing Provider  acetaminophen (TYLENOL) 325 MG tablet Take 650 mg by mouth every 6 (six) hours as needed for mild pain.    Historical Provider, MD  amoxicillin (AMOXIL) 500 MG capsule Take 1 capsule (500 mg total) by mouth 3 (three) times daily. 07/09/15   Courteney Lyn Mackuen, MD  cyclobenzaprine (FLEXERIL) 10 MG tablet Take 1 tablet (10 mg total) by mouth 2 (two) times daily as needed for muscle spasms. Patient not taking: Reported on 07/09/2015 03/21/15   Margarita Mail, PA-C  cyclobenzaprine (FLEXERIL) 10 MG tablet Take 1 tablet (10 mg total) by mouth 2 (two) times daily as needed for muscle spasms. 07/09/15   Courteney Lyn Mackuen, MD  docusate sodium (COLACE) 100 MG capsule Take 1 capsule (100 mg total) by mouth every 12 (twelve) hours. Patient not taking: Reported on 07/09/2015 03/27/15   Montine Circle, PA-C  HYDROcodone-acetaminophen (NORCO/VICODIN) 5-325 MG per tablet Take 1 tablet by mouth every 6 (six) hours as needed. 07/20/15   Montine Circle, PA-C  ibuprofen (ADVIL,MOTRIN) 600 MG tablet Take 1 tablet (600 mg total) by mouth every 6 (six) hours as needed. 07/20/15   Montine Circle, PA-C  levothyroxine (SYNTHROID, LEVOTHROID) 125 MCG tablet Take 125 mcg  by mouth daily. 05/23/14   Historical Provider, MD  lisinopril (PRINIVIL,ZESTRIL) 20 MG tablet Take 20 mg by mouth daily. 06/18/14   Historical Provider, MD  LYRICA 75 MG capsule Take 75 mg by mouth daily. 04/26/14   Historical Provider, MD  metFORMIN (GLUCOPHAGE-XR) 500 MG 24 hr tablet Take 500 mg by mouth daily. 12/04/14   Historical Provider, MD  oxyCODONE-acetaminophen (PERCOCET/ROXICET) 5-325 MG per tablet Take 2 tablets by mouth every 6 (six) hours as needed for severe pain. Patient not taking: Reported on 07/09/2015 03/27/15   Montine Circle, PA-C  simvastatin (ZOCOR) 10 MG tablet Take 20 mg by mouth daily.     Historical Provider, MD  sodium chloride (OCEAN) 0.65 % SOLN nasal spray Place 1  spray into both nostrils as needed for congestion. Patient not taking: Reported on 07/09/2015 01/11/15   Annita Brod, MD  traMADol (ULTRAM) 50 MG tablet Take 1 tablet (50 mg total) by mouth every 6 (six) hours as needed. Patient not taking: Reported on 07/09/2015 03/21/15   Margarita Mail, PA-C  ZETIA 10 MG tablet Take 10 mg by mouth daily. 06/18/14   Historical Provider, MD   BP 148/78 mmHg  Pulse 67  Temp(Src) 98.2 F (36.8 C) (Oral)  Resp 16  Ht 5' 6.5" (1.689 m)  Wt 195 lb 7 oz (88.65 kg)  BMI 31.08 kg/m2  SpO2 97% Physical Exam  Constitutional: She appears well-developed and well-nourished. No distress.  HENT:  Head: Normocephalic and atraumatic.    Left Ear: Tympanic membrane and ear canal normal.  Neck: Neck supple.  Cardiovascular: Normal rate and regular rhythm.   Pulmonary/Chest: Effort normal and breath sounds normal. No respiratory distress. She has no wheezes. She has no rales.  Abdominal: Soft. She exhibits no distension. There is tenderness in the epigastric area, left upper quadrant and left lower quadrant. There is no rebound and no guarding.  Neurological: She is alert.  Moves all extremities equally, no facial droop.    Skin: She is not diaphoretic.  Nursing note and vitals reviewed.   ED Course  Procedures (including critical care time) Labs Review Labs Reviewed  COMPREHENSIVE METABOLIC PANEL - Abnormal; Notable for the following:    Glucose, Bld 133 (*)    Creatinine, Ser 1.06 (*)    GFR calc non Af Amer 58 (*)    All other components within normal limits  URINALYSIS, ROUTINE W REFLEX MICROSCOPIC (NOT AT [facility]) - Abnormal; Notable for the following:    Protein, ur 100 (*)    All other components within normal limits  SEDIMENTATION RATE - Abnormal; Notable for the following:    Sed Rate 30 (*)    All other components within normal limits  URINE MICROSCOPIC-ADD ON - Abnormal; Notable for the following:    Squamous Epithelial / LPF FEW (*)    All  other components within normal limits  LIPASE, BLOOD  CBC    Imaging Review Ct Abdomen Pelvis W Contrast  08/12/2015  CLINICAL DATA:  Left-sided abdominal pain for 2 days. nausea and vomiting. Constipation. EXAM: CT ABDOMEN AND PELVIS WITH CONTRAST TECHNIQUE: Multidetector CT imaging of the abdomen and pelvis was performed using the standard protocol following bolus administration of intravenous contrast. CONTRAST:  159mL OMNIPAQUE IOHEXOL 300 MG/ML  SOLN COMPARISON:  01/10/2015 FINDINGS: Lower chest:  No acute findings. Hepatobiliary: No masses or other significant abnormality. Gallbladder is unremarkable. Pancreas: No mass, inflammatory changes, or other significant abnormality. Spleen: Within normal limits in size and appearance. Adrenals/Urinary  Tract: No masses identified. No evidence of hydronephrosis. Stomach/Bowel: No evidence of obstruction, inflammatory process, or abnormal fluid collections. Normal appendix visualized. Vascular/Lymphatic: No pathologically enlarged lymph nodes. No evidence of abdominal aortic aneurysm. Aortic atherosclerotic plaque noted. Reproductive: No mass or other significant abnormality. Other: None. Musculoskeletal:  No suspicious bone lesions identified. IMPRESSION: No acute findings or other significant abnormality identified within the abdomen or pelvis. Electronically Signed   By: Earle Gell M.D.   On: 08/12/2015 15:19      EKG Interpretation None       3:06 PM Discussed pt with Dr Stark Jock.  Labs reviewed and Dr Stark Jock has also reviewed CT scan.   3:51 PM Patient states she is feeling much better.    MDM   Final diagnoses:  Abdominal pain, unspecified abdominal location  Left-sided headache  Otalgia, left   Afebrile nontoxic patient with nearly two weeks of left ear pain with associated left sided headache.  She is tender throughout side of head with specific tenderness in the left ear canal but without abnormal findings visually.  Sed rate only mildly  elevated.   She does have hx migraines and this pain may be related to migraine.  She has no neurologic complaints.  She has already been seen by her PCP for this and will continue workup with him.  Pt also c/o left sided abdominal pain with N/V.  She has been off her prilosec.  CT abd/pelvis and labs, UA unremarkable.  Pt feeling better after single dose of medications, tolerating PO fluids.  D/C home with prilosec, zofran, cortisporin otic.  Close PCP follow up.  Discussed result, findings, treatment, and follow up  with patient.  Pt given return precautions.  Pt verbalizes understanding and agrees with plan.        Clayton Bibles, PA-C 08/12/15 1601  Veryl Speak, MD 08/15/15 [phone]

## 2015-08-12 NOTE — ED Notes (Signed)
Pt reports recently being treated for ear infection and started on antibiotic. Having left ear pain, headache, sensitivity to light, abd pain, n/v. Had recent constipation and took laxative yesterday which helped relieve her but still having n/v this am.

## 2015-08-12 NOTE — Discharge Instructions (Signed)
Read the information below.  Use the prescribed medication as directed.  Please discuss all new medications with your pharmacist.  You may return to the Emergency Department at any time for worsening condition or any new symptoms that concern you.    If you develop high fevers, worsening abdominal pain, uncontrolled vomiting, or are unable to tolerate fluids by mouth, return to the ER for a recheck.    If you develop fevers, uncontrolled ear pain, bleeding or discharge from your ear, see your doctor or return for a recheck.      Abdominal Pain, Adult Many things can cause abdominal pain. Usually, abdominal pain is not caused by a disease and will improve without treatment. It can often be observed and treated at home. Your health care provider will do a physical exam and possibly order blood tests and X-rays to help determine the seriousness of your pain. However, in many cases, more time must pass before a clear cause of the pain can be found. Before that point, your health care provider may not know if you need more testing or further treatment. HOME CARE INSTRUCTIONS Monitor your abdominal pain for any changes. The following actions may help to alleviate any discomfort you are experiencing:  Only take over-the-counter or prescription medicines as directed by your health care provider.  Do not take laxatives unless directed to do so by your health care provider.  Try a clear liquid diet (broth, tea, or water) as directed by your health care provider. Slowly move to a bland diet as tolerated. SEEK MEDICAL CARE IF:  You have unexplained abdominal pain.  You have abdominal pain associated with nausea or diarrhea.  You have pain when you urinate or have a bowel movement.  You experience abdominal pain that wakes you in the night.  You have abdominal pain that is worsened or improved by eating food.  You have abdominal pain that is worsened with eating fatty foods.  You have a fever. SEEK  IMMEDIATE MEDICAL CARE IF:  Your pain does not go away within 2 hours.  You keep throwing up (vomiting).  Your pain is felt only in portions of the abdomen, such as the right side or the left lower portion of the abdomen.  You pass bloody or black tarry stools. MAKE SURE YOU:  Understand these instructions.  Will watch your condition.  Will get help right away if you are not doing well or get worse.   This information is not intended to replace advice given to you by your health care provider. Make sure you discuss any questions you have with your health care provider.   Document Released: 07/23/2005 Document Revised: 07/04/2015 Document Reviewed: 06/22/2013 Elsevier Interactive Patient Education 2016 [place] Headache Without Cause A headache is pain or discomfort felt around the head or neck area. The specific cause of a headache may not be found. There are many causes and types of headaches. A few common ones are:  Tension headaches.  Migraine headaches.  Cluster headaches.  Chronic daily headaches. HOME CARE INSTRUCTIONS  Watch your condition for any changes. Take these steps to help with your condition: Managing Pain  Take over-the-counter and prescription medicines only as told by your health care provider.  Lie down in a dark, quiet room when you have a headache.  If directed, apply ice to the head and neck area:  Put ice in a plastic bag.  Place a towel between your skin and the bag.  Leave  the ice on for 20 minutes, 2-3 times per day.  Use a heating pad or hot shower to apply heat to the head and neck area as told by your health care provider.  Keep lights dim if bright lights bother you or make your headaches worse. Eating and Drinking  Eat meals on a regular schedule.  Limit alcohol use.  Decrease the amount of caffeine you drink, or stop drinking caffeine. General Instructions  Keep all follow-up visits as told by your health care  provider. This is important.  Keep a headache journal to help find out what may trigger your headaches. For example, write down:  What you eat and drink.  How much sleep you get.  Any change to your diet or medicines.  Try massage or other relaxation techniques.  Limit stress.  Sit up straight, and do not tense your muscles.  Do not use tobacco products, including cigarettes, chewing tobacco, or e-cigarettes. If you need help quitting, ask your health care provider.  Exercise regularly as told by your health care provider.  Sleep on a regular schedule. Get 7-9 hours of sleep, or the amount recommended by your health care provider. SEEK MEDICAL CARE IF:   Your symptoms are not helped by medicine.  You have a headache that is different from the usual headache.  You have nausea or you vomit.  You have a fever. SEEK IMMEDIATE MEDICAL CARE IF:   Your headache becomes severe.  You have repeated vomiting.  You have a stiff neck.  You have a loss of vision.  You have problems with speech.  You have pain in the eye or ear.  You have muscular weakness or loss of muscle control.  You lose your balance or have trouble walking.  You feel faint or pass out.  You have confusion.   This information is not intended to replace advice given to you by your health care provider. Make sure you discuss any questions you have with your health care provider.   Document Released: 10/13/2005 Document Revised: 07/04/2015 Document Reviewed: 02/05/2015 Elsevier Interactive Patient Education [employer].

## 2015-08-12 NOTE — ED Notes (Signed)
Sprite given to pt

## 2015-08-20 ENCOUNTER — Other Ambulatory Visit: Payer: Self-pay | Admitting: Orthopedic Surgery

## 2015-08-28 HISTORY — PX: CARPAL TUNNEL RELEASE: SHX101

## 2016-01-03 ENCOUNTER — Ambulatory Visit: Payer: Medicare Other | Admitting: Podiatry

## 2016-01-07 ENCOUNTER — Other Ambulatory Visit (INDEPENDENT_AMBULATORY_CARE_PROVIDER_SITE_OTHER): Payer: Self-pay | Admitting: Otolaryngology

## 2016-01-07 DIAGNOSIS — J0101 Acute recurrent maxillary sinusitis: Secondary | ICD-10-CM

## 2016-01-10 ENCOUNTER — Ambulatory Visit (INDEPENDENT_AMBULATORY_CARE_PROVIDER_SITE_OTHER): Payer: Medicare Other | Admitting: Podiatry

## 2016-01-10 ENCOUNTER — Ambulatory Visit (INDEPENDENT_AMBULATORY_CARE_PROVIDER_SITE_OTHER): Payer: Medicare Other

## 2016-01-10 ENCOUNTER — Encounter: Payer: Self-pay | Admitting: Podiatry

## 2016-01-10 VITALS — BP 94/56 | HR 71 | Resp 16 | Ht 67.5 in | Wt 197.0 lb

## 2016-01-10 DIAGNOSIS — M79671 Pain in right foot: Secondary | ICD-10-CM

## 2016-01-10 DIAGNOSIS — M79672 Pain in left foot: Secondary | ICD-10-CM

## 2016-01-10 DIAGNOSIS — L600 Ingrowing nail: Secondary | ICD-10-CM

## 2016-01-10 DIAGNOSIS — M779 Enthesopathy, unspecified: Secondary | ICD-10-CM

## 2016-01-10 MED ORDER — TRIAMCINOLONE ACETONIDE 10 MG/ML IJ SUSP
10.0000 mg | Freq: Once | INTRAMUSCULAR | Status: AC
  Administered 2016-01-10: 10 mg

## 2016-01-10 MED ORDER — OXYCODONE-ACETAMINOPHEN 10-325 MG PO TABS: 1.0000 | ORAL_TABLET | Freq: Three times a day (TID) | ORAL | Status: DC | PRN

## 2016-01-10 NOTE — Patient Instructions (Signed)

## 2016-01-10 NOTE — Progress Notes (Signed)
   Subjective:    Patient ID: Margaret Webster, female    DOB: [birth year], 55 y.o.   MRN: [id number]  HPI Patient presents with a bilateral nail problem; great toes-both sides.  Patient also presents with bilateral foot pain; dorsal & lateral. Pt stated, "has cysts in foot".  Pt diabetic type 2; sugar=did not take today; A1C=5.9   Review of Systems  Constitutional: Positive for appetite change.  HENT: Positive for ear pain, sinus pressure and sneezing.   Eyes: Positive for pain and itching.  Respiratory: Positive for cough.   Gastrointestinal: Positive for nausea, vomiting and abdominal distention.  Musculoskeletal: Positive for myalgias, back pain, arthralgias and gait problem.  Neurological: Positive for weakness, light-headedness, numbness and headaches.  All other systems reviewed and are negative.      Objective:   Physical Exam        Assessment & Plan:

## 2016-01-16 NOTE — Progress Notes (Signed)
Subjective:     Patient ID: Margaret Webster, female   DOB: Oct 05, 1961, 55 y.o.   MRN: [id number]  HPI patient presents with painful ingrown toenails of both big toes stating that she cannot trim them and cannot cut them herself. Her sugar is under excellent control and her last A1c was 5.9   Review of Systems  All other systems reviewed and are negative.      Objective:   Physical Exam  Constitutional: She is oriented to person, place, and time.  Cardiovascular: Intact distal pulses.   Musculoskeletal: Normal range of motion.  Neurological: She is oriented to person, place, and time.  Skin: Skin is warm.  Nursing note and vitals reviewed.  neurovascular status intact muscle strength adequate range of motion within normal limits. Patient's noted to have painful incurvated nailbeds hallux bilateral medial and lateral side with pain and is noted to have no drainage and some slight distal redness secondary to irritation. Patient has good digital perfusion and is well oriented 3     Assessment:     Chronic ingrown toenail deformity hallux bilateral with moderate change in the nailbed secondary to structure    Plan:     H&P and conditions reviewed. I've recommended removal of the nail corners and I explained the procedure and risk and patient wants this done. She understands no guarantee as far as the correction or healing and she is willing to accept risk and today I infiltrated each hallux 60 mg Xylocaine Marcaine mixture and remove the medial and lateral border under sterile condition. Exposed matrix and applied phenol 3 applications 30 seconds followed by alcohol lavage and sterile dressing and gave instructions on soaks and reappoint

## 2016-01-18 ENCOUNTER — Other Ambulatory Visit: Payer: Medicare Other

## 2016-01-22 ENCOUNTER — Telehealth: Payer: Self-pay | Admitting: *Deleted

## 2016-01-22 NOTE — Telephone Encounter (Signed)
Called patient at [phone] (Home #) to check to see how they were doing from their ingrown toenail procedure that was performed on Thursday, January 10, 2016. Pt stated, "Toe is doing good and is not hurting".

## 2016-01-24 ENCOUNTER — Ambulatory Visit
Admission: RE | Admit: 2016-01-24 | Discharge: 2016-01-24 | Disposition: A | Discharge status: Home / Self Care | Payer: Medicare Other | Source: Ambulatory Visit | Attending: Otolaryngology | Admitting: Otolaryngology

## 2016-01-24 DIAGNOSIS — J0101 Acute recurrent maxillary sinusitis: Secondary | ICD-10-CM

## 2016-07-10 ENCOUNTER — Emergency Department (HOSPITAL_COMMUNITY)
Admission: EM | Admit: 2016-07-10 | Discharge: 2016-07-10 | Disposition: A | Discharge status: Home / Self Care | Payer: Medicare Other | Attending: Emergency Medicine | Admitting: Emergency Medicine

## 2016-07-10 ENCOUNTER — Encounter (HOSPITAL_COMMUNITY): Payer: Self-pay | Admitting: Emergency Medicine

## 2016-07-10 DIAGNOSIS — F17210 Nicotine dependence, cigarettes, uncomplicated: Secondary | ICD-10-CM | POA: Diagnosis not present

## 2016-07-10 DIAGNOSIS — I10 Essential (primary) hypertension: Secondary | ICD-10-CM | POA: Insufficient documentation

## 2016-07-10 DIAGNOSIS — M25551 Pain in right hip: Secondary | ICD-10-CM | POA: Insufficient documentation

## 2016-07-10 DIAGNOSIS — Z7984 Long term (current) use of oral hypoglycemic drugs: Secondary | ICD-10-CM | POA: Insufficient documentation

## 2016-07-10 DIAGNOSIS — E119 Type 2 diabetes mellitus without complications: Secondary | ICD-10-CM | POA: Diagnosis not present

## 2016-07-10 MED ORDER — PREDNISONE 10 MG PO TABS: 20.0000 mg | ORAL_TABLET | Freq: Two times a day (BID) | ORAL | 0 refills | Status: AC

## 2016-07-10 MED ORDER — CYCLOBENZAPRINE HCL 10 MG PO TABS: 10.0000 mg | ORAL_TABLET | Freq: Two times a day (BID) | ORAL | 0 refills | Status: DC | PRN

## 2016-07-10 MED ORDER — LIDOCAINE 5 % EX PTCH: 1.0000 | MEDICATED_PATCH | CUTANEOUS | 0 refills | Status: DC

## 2016-07-10 NOTE — ED Provider Notes (Signed)
Margaret Webster Provider Note   CSN: [id number] Arrival date & time: 07/10/16  1243  By signing my name below, I, Margaret Webster, attest that this documentation has been prepared under the direction and in the presence of Margaret Tiede C. Vyron Fronczak, PA-C. Electronically Signed: [name], ED Scribe. 07/10/16. 2:07 PM.   History   Chief Complaint Chief Complaint  Patient presents with  . Hip Pain    HPI HPI Comments: Margaret Webster is a 55 y.o. female who presents to the Emergency Department complaining of constant, moderate, right hip pain x 4 days. She states he has been applying [place] to the region with minimal relief. Pt reports associated, mild, tingling in the right hip when sitting for extended periods as well as a "knot" which she says is present in the region. Her pain worsens with ambulation and radiates to her lower back and down her RLE. She reports a h/o similar symptoms a few years ago and was given steroid injections which provided relief. Pt has a surgical history to her lumbar and cervical spine. No other associated symptoms at this time.    The history is provided by the patient. No language interpreter was used.   Past Medical History:  Diagnosis Date  . Bronchitis   . Chronic back pain   . Diabetes mellitus   . Hypertension   . Thyroid disease     Patient Active Problem List   Diagnosis Date Noted  . Chest pain 01/10/2015  . Diabetes mellitus 01/10/2015  . Hypertension 01/10/2015  . Thyroid disease 01/10/2015  . Headache 01/10/2015  . Abdominal pain 01/10/2015  . Vaginitis 04/07/2014  . Pain 04/07/2014  . Vaginal irritation 04/07/2014  . Boil of buttock 04/06/2014  . BV (bacterial vaginosis) 04/06/2014  . Unspecified symptom associated with female genital organs 11/16/2013  . Candidiasis of vulva and vagina 11/16/2013  . Vaginitis and vulvovaginitis, unspecified 06/07/2013  . Pain aggravated by activities of daily living 06/07/2013  . Nonspecific  abnormal finding in stool contents 12/02/2011  . Diabetes mellitus without mention of complication [id number]  . Chronic back pain     Past Surgical History:  Procedure Laterality Date  . BACK SURGERY  1993  . CERVICAL SPINE SURGERY Right 11/2012  . OOPHORECTOMY      OB History    No data available       Home Medications    Prior to Admission medications   Medication Sig Start Date End Date Taking? Authorizing Provider  acetaminophen (TYLENOL) 325 MG tablet Take 650 mg by mouth every 6 (six) hours as needed for mild pain.    Historical Provider, MD  amoxicillin (AMOXIL) 500 MG capsule Take 1 capsule (500 mg total) by mouth 3 (three) times daily. 07/09/15   Margaret Lyn Mackuen, MD  cyclobenzaprine (FLEXERIL) 10 MG tablet Take 1 tablet (10 mg total) by mouth 2 (two) times daily as needed for muscle spasms. 07/10/16   Margaret Martis C Koki Buxton, PA-C  docusate sodium (COLACE) 100 MG capsule Take 1 capsule (100 mg total) by mouth every 12 (twelve) hours. 03/27/15   Montine Circle, PA-C  levothyroxine (SYNTHROID, LEVOTHROID) 125 MCG tablet Take 125 mcg by mouth daily. 05/23/14   Historical Provider, MD  lidocaine (LIDODERM) 5 % Place 1 patch onto the skin daily. Remove & Discard patch within 12 hours or as directed by MD 07/10/16   Margaret Gunther Emaley Applin, PA-C  lisinopril (PRINIVIL,ZESTRIL) 20 MG tablet Take 20 mg by mouth daily. 06/18/14   Historical  Provider, MD  LYRICA 75 MG capsule Take 75 mg by mouth daily. 04/26/14   Historical Provider, MD  metFORMIN (GLUCOPHAGE-XR) 500 MG 24 hr tablet Take 500 mg by mouth daily. 12/04/14   Historical Provider, MD  neomycin-polymyxin-hydrocortisone (CORTISPORIN) 3.5-10000-1 otic suspension Place 4 drops into both ears 4 (four) times daily. X 7 days 08/12/15   Margaret Bibles, PA-C  omeprazole (PRILOSEC) 20 MG capsule Take 1 capsule (20 mg total) by mouth daily. 08/12/15   Margaret Bibles, PA-C  ondansetron (ZOFRAN) 4 MG tablet Take 1 tablet (4 mg total) by mouth every 8 (eight) hours as  needed for nausea or vomiting. 08/12/15   Margaret Bibles, PA-C  oxyCODONE-acetaminophen (PERCOCET) 10-325 MG tablet Take 1 tablet by mouth every 8 (eight) hours as needed for pain. 01/10/16   Margaret Webster, Margaret Webster  predniSONE (DELTASONE) 10 MG tablet Take 2 tablets (20 mg total) by mouth 2 (two) times daily with a meal. 07/10/16 07/15/16  Margaret Quaranta C Iman Orourke, PA-C  simvastatin (ZOCOR) 10 MG tablet Take 20 mg by mouth daily.     Historical Provider, MD  sodium chloride (OCEAN) 0.65 % SOLN nasal spray Place 1 spray into both nostrils as needed for congestion. 01/11/15   Margaret Brod, MD  traMADol (ULTRAM) 50 MG tablet Take 1 tablet (50 mg total) by mouth every 6 (six) hours as needed. 03/21/15   Margaret Harris, PA-C  ZETIA 10 MG tablet Take 10 mg by mouth daily. 06/18/14   Historical Provider, MD    Family History Family History  Problem Relation Age of Onset  . Diabetes Mother   . Cystic fibrosis Sister   . Cancer Sister     ?  Margaret Webster Kitchen Heart disease Maternal Grandmother   . Clotting disorder Maternal Grandfather     Social History Social History  Substance Use Topics  . Smoking status: Current Every Day Smoker    Packs/day: 0.50    Types: Cigarettes  . Smokeless tobacco: Never Used  . Alcohol use Yes     Comment: rarely     Allergies   Sulfa antibiotics; Ibuprofen; Influenza vaccines; Lipitor [atorvastatin calcium]; and Naproxen   Review of Systems Review of Systems  Musculoskeletal: Positive for arthralgias, back pain and myalgias.  Skin: Negative for color change and wound.  Neurological: Negative for weakness and numbness.       Tingling  Psychiatric/Behavioral: Decreased concentration:     Physical Exam Updated Vital Signs BP 116/72   Pulse 62   Temp 98.4 F (36.9 C) (Oral)   Resp 18   Ht 5\' 5"  (1.651 m)   Wt 89.4 kg   SpO2 100%   BMI 32.78 kg/m   Physical Exam  Constitutional: She appears well-developed and well-nourished. No distress.  HENT:  Head: Normocephalic and  atraumatic.  Eyes: Conjunctivae are normal.  Neck: Neck supple.  Cardiovascular: Normal rate and regular rhythm.   Pulmonary/Chest: Effort normal.  Musculoskeletal: She exhibits tenderness.  Tenderness to the right lumbar and sacral musculature. Right hip: tenderness to posterior right hip. FROM of the right hip. No exquisite tenderness, increased warmth, erythema or signs of infection.   Neurological: She is alert. She has normal strength. No sensory deficit.  No sensory deficits. Strength 5/5.   Skin: Skin is warm and dry. She is not diaphoretic. No erythema.  Psychiatric: She has a normal mood and affect. Her behavior is normal.  Nursing note and vitals reviewed.  ED Treatments / Results  Labs (all labs ordered are  listed, but only abnormal results are displayed) Labs Reviewed - No data to display  EKG  EKG Interpretation None       Radiology No results found.  Procedures Procedures  DIAGNOSTIC STUDIES: Oxygen Saturation is 100% on RA, normal by my interpretation.    COORDINATION OF CARE: 2:05 PM Discussed next steps with pt. Pt verbalized understanding and is agreeable with the plan.    Medications Ordered in ED Medications - No data to display   Initial Impression / Assessment and Plan / ED Course  I have reviewed the triage vital signs and the nursing notes.  Pertinent labs & imaging results that were available during my care of the patient were reviewed by me and considered in my medical decision making (see chart for details).  Clinical Course    Pt present with right hip pain. No neurological deficits and normal neuro exam.  Patient is ambulatory.  Very low suspicion for septic joint. Will d/c pt with flexeril, Lidoderm patches and prednisone. Supportive care and return precautions discussed. Appears safe for discharge at this time. Follow up as indicated in discharge paperwork.   I personally performed the services described in this documentation, which  was scribed in my presence. The recorded information has been reviewed and is accurate.   Final Clinical Impressions(s) / ED Diagnoses   Final diagnoses:  Right hip pain    New Prescriptions Discharge Medication List as of 07/10/2016  2:09 PM    START taking these medications   Details  lidocaine (LIDODERM) 5 % Place 1 patch onto the skin daily. Remove & Discard patch within 12 hours or as directed by MD, Starting Thu 07/10/2016, Print    predniSONE (DELTASONE) 10 MG tablet Take 2 tablets (20 mg total) by mouth 2 (two) times daily with a meal., Starting Thu 07/10/2016, Until Tue 07/15/2016, Print         Lorayne Bender, PA-C 07/12/16 [place] Liu, MD 07/13/16 1536

## 2016-07-10 NOTE — ED Triage Notes (Signed)
Pt states over the last 5 days she has been having right hip pain and pain when walking. Pt states this happen a few years ago with arthritis and received injections with relief.

## 2016-07-10 NOTE — Discharge Instructions (Signed)
Take it easy, but do not lay around too much as this may make the stiffness worse. If you are able, take 500 mg of naproxen every 12 hours or 800 mg of ibuprofen every 8 hours for the next 3 days. Take these medications with food to avoid upset stomach. Flexeril is a muscle relaxer and may help loosen stiff muscles. Do not take the Flexeril while driving or performing other dangerous activities. Follow-up with orthopedics.

## 2016-09-12 ENCOUNTER — Ambulatory Visit (INDEPENDENT_AMBULATORY_CARE_PROVIDER_SITE_OTHER): Payer: Medicare Other | Admitting: Podiatry

## 2016-09-12 ENCOUNTER — Encounter: Payer: Self-pay | Admitting: Podiatry

## 2016-09-12 DIAGNOSIS — L600 Ingrowing nail: Secondary | ICD-10-CM | POA: Diagnosis not present

## 2016-09-15 NOTE — Progress Notes (Signed)
Subjective:     Patient ID: Margaret Webster, female   DOB: November 13, 1960, 55 y.o.   MRN: [id number]  HPI patient presents stating that she has had problems with a big toenail left and she no she probably needs to get it fixed   Review of Systems     Objective:   Physical Exam  Neurovascular status intact muscle strength was adequate patient found to have a damaged left hallux nail that's loose when pressed and is incurvated in the corners with crusted tissue across the top and multiple signs of trauma    Assessment:     Damaged left hallux nail with incurvation and pain    Plan:     H&P condition reviewed and recommended nail removal. I explained procedure and risk and she wants this done but cannot do it until she is finished with vacation. We reviewed at great length the procedure and risk and she wants this performed and will have performed in the next 3-4 weeks

## 2016-10-31 ENCOUNTER — Ambulatory Visit: Payer: Medicare Other | Admitting: Podiatry

## 2017-01-05 ENCOUNTER — Encounter (HOSPITAL_COMMUNITY): Payer: Self-pay | Admitting: Emergency Medicine

## 2017-01-05 ENCOUNTER — Emergency Department (HOSPITAL_COMMUNITY): Payer: Medicare Other

## 2017-01-05 ENCOUNTER — Emergency Department (HOSPITAL_COMMUNITY)
Admission: EM | Admit: 2017-01-05 | Discharge: 2017-01-05 | Disposition: A | Discharge status: Home / Self Care | Payer: Medicare Other | Attending: Emergency Medicine | Admitting: Emergency Medicine

## 2017-01-05 DIAGNOSIS — Y999 Unspecified external cause status: Secondary | ICD-10-CM | POA: Diagnosis not present

## 2017-01-05 DIAGNOSIS — Y929 Unspecified place or not applicable: Secondary | ICD-10-CM | POA: Insufficient documentation

## 2017-01-05 DIAGNOSIS — M25562 Pain in left knee: Secondary | ICD-10-CM | POA: Insufficient documentation

## 2017-01-05 DIAGNOSIS — E119 Type 2 diabetes mellitus without complications: Secondary | ICD-10-CM | POA: Diagnosis not present

## 2017-01-05 DIAGNOSIS — W010XXA Fall on same level from slipping, tripping and stumbling without subsequent striking against object, initial encounter: Secondary | ICD-10-CM | POA: Insufficient documentation

## 2017-01-05 DIAGNOSIS — S39012A Strain of muscle, fascia and tendon of lower back, initial encounter: Secondary | ICD-10-CM

## 2017-01-05 DIAGNOSIS — F17210 Nicotine dependence, cigarettes, uncomplicated: Secondary | ICD-10-CM | POA: Diagnosis not present

## 2017-01-05 DIAGNOSIS — M545 Low back pain, unspecified: Secondary | ICD-10-CM

## 2017-01-05 DIAGNOSIS — Y939 Activity, unspecified: Secondary | ICD-10-CM | POA: Diagnosis not present

## 2017-01-05 DIAGNOSIS — I10 Essential (primary) hypertension: Secondary | ICD-10-CM | POA: Diagnosis not present

## 2017-01-05 DIAGNOSIS — Z7984 Long term (current) use of oral hypoglycemic drugs: Secondary | ICD-10-CM | POA: Diagnosis not present

## 2017-01-05 DIAGNOSIS — S3992XA Unspecified injury of lower back, initial encounter: Secondary | ICD-10-CM | POA: Diagnosis present

## 2017-01-05 HISTORY — DX: Unspecified osteoarthritis, unspecified site: M19.90

## 2017-01-05 MED ORDER — METHOCARBAMOL 500 MG PO TABS
500.0000 mg | ORAL_TABLET | Freq: Once | ORAL | Status: AC
  Administered 2017-01-05: 500 mg via ORAL
  Filled 2017-01-05: qty 1

## 2017-01-05 MED ORDER — METHOCARBAMOL 500 MG PO TABS
500.0000 mg | ORAL_TABLET | Freq: Two times a day (BID) | ORAL | 0 refills | Status: DC
Start: 2017-01-05 — End: 2017-04-03

## 2017-01-05 MED ORDER — OXYCODONE-ACETAMINOPHEN 5-325 MG PO TABS
1.0000 | ORAL_TABLET | Freq: Once | ORAL | Status: AC
  Administered 2017-01-05: 1 via ORAL
  Filled 2017-01-05: qty 1

## 2017-01-05 MED ORDER — ACETAMINOPHEN 325 MG PO TABS: 650.0000 mg | ORAL_TABLET | Freq: Four times a day (QID) | ORAL | 0 refills | Status: DC | PRN

## 2017-01-05 NOTE — ED Triage Notes (Signed)
C/o l/knee pain. Pt slipped on the ice and came down on l/knee, jarring her low back. Denies LOC, denies striking head

## 2017-01-05 NOTE — Discharge Instructions (Signed)
Please read and follow all provided instructions.  Your diagnoses today include:  1. Acute pain of left knee   2. Acute left-sided low back pain without sciatica   3. Strain of lumbar region, initial encounter     Tests performed today include: Vital signs. See below for your results today.   Medications prescribed:  Take as prescribed   Home care instructions:  Follow any educational materials contained in this packet.  Follow-up instructions: Please follow-up with your primary care provider for further evaluation of symptoms and treatment   Return instructions:  Please return to the Emergency Department if you do not get better, if you get worse, or new symptoms OR  - Fever (temperature greater than 101.33F)  - Bleeding that does not stop with holding pressure to the area    -Severe pain (please note that you may be more sore the day after your accident)  - Chest Pain  - Difficulty breathing  - Severe nausea or vomiting  - Inability to tolerate food and liquids  - Passing out  - Skin becoming red around your wounds  - Change in mental status (confusion or lethargy)  - New numbness or weakness    Please return if you have any other emergent concerns.  Additional Information:  Your vital signs today were: BP 145/75 (BP Location: Right Arm)    Pulse 74    Temp 98.1 F (36.7 C) (Oral)    Resp 18    Ht 5' 7.5" (1.715 m)    Wt 87.1 kg    SpO2 98%    BMI 29.63 kg/m  If your blood pressure (BP) was elevated above 135/85 this visit, please have this repeated by your doctor within one month. ---------------

## 2017-01-05 NOTE — ED Provider Notes (Signed)
[facility] DEPT Provider Note   CSN: [id number] Arrival date & time: 01/05/17  1049  By signing my name below, I, Margaret Webster, attest that this documentation has been prepared under the direction and in the presence of [name], PA-C. Electronically Signed: Evelene Webster, Scribe. 01/05/2017. 11:21 AM.  History   Chief Complaint Chief Complaint  Patient presents with  . Knee Pain  . Back Pain    The history is provided by the patient. No language interpreter was used.    HPI Comments:  Margaret Webster is a 56 y.o. female with a history of chronic back pain secondary to h/o back/cervical surgeries, who presents to the Emergency Department complaining of sudden onset left knee s/p fall ~ 1 hour ago. Pt states she slipped on ice and landed hard on the left knee. She reports associated back pain and neck pain following the accident. She rates her pain a 9/10. Pt denies head injury, LOC, numbness/weakness in her extremities and bowel/bladder incontinences. No alleviating factors noted.  Past Medical History:  Diagnosis Date  . Bronchitis   . Chronic back pain   . Diabetes mellitus   . Hypertension   . Thyroid disease     Patient Active Problem List   Diagnosis Date Noted  . Chest pain 01/10/2015  . Diabetes mellitus 01/10/2015  . Hypertension 01/10/2015  . Thyroid disease 01/10/2015  . Headache 01/10/2015  . Abdominal pain 01/10/2015  . Vaginitis 04/07/2014  . Pain 04/07/2014  . Vaginal irritation 04/07/2014  . Boil of buttock 04/06/2014  . BV (bacterial vaginosis) 04/06/2014  . Unspecified symptom associated with female genital organs 11/16/2013  . Candidiasis of vulva and vagina 11/16/2013  . Vaginitis and vulvovaginitis, unspecified 06/07/2013  . Pain aggravated by activities of daily living 06/07/2013  . Nonspecific abnormal finding in stool contents 12/02/2011  . Diabetes mellitus without mention of complication 26/37/8588  . Chronic back pain     Past  Surgical History:  Procedure Laterality Date  . BACK SURGERY  1993  . CERVICAL SPINE SURGERY Right 11/2012  . OOPHORECTOMY      OB History    No data available       Home Medications    Prior to Admission medications   Medication Sig Start Date End Date Taking? Authorizing Provider  acetaminophen (TYLENOL) 325 MG tablet Take 650 mg by mouth every 6 (six) hours as needed for mild pain.    Historical Provider, MD  cyclobenzaprine (FLEXERIL) 10 MG tablet Take 1 tablet (10 mg total) by mouth 2 (two) times daily as needed for muscle spasms. 07/10/16   Shawn C Joy, PA-C  docusate sodium (COLACE) 100 MG capsule Take 1 capsule (100 mg total) by mouth every 12 (twelve) hours. 03/27/15   Montine Circle, PA-C  levothyroxine (SYNTHROID, LEVOTHROID) 125 MCG tablet Take 125 mcg by mouth daily. 05/23/14   Historical Provider, MD  lidocaine (LIDODERM) 5 % Place 1 patch onto the skin daily. Remove & Discard patch within 12 hours or as directed by MD 07/10/16   Helane Gunther Joy, PA-C  lisinopril (PRINIVIL,ZESTRIL) 20 MG tablet Take 20 mg by mouth daily. 06/18/14   Historical Provider, MD  LYRICA 75 MG capsule Take 75 mg by mouth daily. 04/26/14   Historical Provider, MD  metFORMIN (GLUCOPHAGE-XR) 500 MG 24 hr tablet Take 500 mg by mouth daily. 12/04/14   Historical Provider, MD  omeprazole (PRILOSEC) 20 MG capsule Take 1 capsule (20 mg total) by mouth daily. 08/12/15  Clayton Bibles, PA-C  oxyCODONE-acetaminophen (PERCOCET) 10-325 MG tablet Take 1 tablet by mouth every 8 (eight) hours as needed for pain. 01/10/16   Wallene Huh, DPM  simvastatin (ZOCOR) 10 MG tablet Take 20 mg by mouth daily.     Historical Provider, MD  sodium chloride (OCEAN) 0.65 % SOLN nasal spray Place 1 spray into both nostrils as needed for congestion. 01/11/15   Annita Brod, MD  traMADol (ULTRAM) 50 MG tablet Take 1 tablet (50 mg total) by mouth every 6 (six) hours as needed. 03/21/15   Abigail Harris, PA-C  ZETIA 10 MG tablet Take 10 mg by  mouth daily. 06/18/14   Historical Provider, MD    Family History Family History  Problem Relation Age of Onset  . Diabetes Mother   . Cystic fibrosis Sister   . Cancer Sister     ?  Marland Kitchen Heart disease Maternal Grandmother   . Clotting disorder Maternal Grandfather     Social History Social History  Substance Use Topics  . Smoking status: Current Every Day Smoker    Packs/day: 0.50    Types: Cigarettes  . Smokeless tobacco: Never Used  . Alcohol use Yes     Comment: rarely     Allergies   Sulfa antibiotics; Ibuprofen; Influenza vaccines; Lipitor [atorvastatin calcium]; and Naproxen   Review of Systems Review of Systems  Musculoskeletal: Positive for arthralgias, back pain, myalgias and neck pain.  Neurological: Negative for syncope.     Physical Exam Updated Vital Signs BP 145/75 (BP Location: Right Arm)   Pulse 74   Temp 98.1 F (36.7 C) (Oral)   Resp 18   Ht 5' 7.5" (1.715 m)   Wt 192 lb (87.1 kg)   SpO2 98%   BMI 29.63 kg/m   Physical Exam  Constitutional: She is oriented to person, place, and time. Vital signs are normal. She appears well-developed and well-nourished. No distress.  HENT:  Head: Normocephalic and atraumatic.  Right Ear: Hearing normal.  Left Ear: Hearing normal.  Eyes: Conjunctivae and EOM are normal. Pupils are equal, round, and reactive to light.  Neck: Normal range of motion. Neck supple.  Cardiovascular: Normal rate, regular rhythm, normal heart sounds and intact distal pulses.   Pulmonary/Chest: Effort normal and breath sounds normal.  Abdominal: Soft. She exhibits no distension.  Musculoskeletal:  TTP right lower lumbar musculature; no midline spinous process tenderness No palpable or visible deformities Left knee: Negative anterior/poster drawer bilaterally. Negative ballottement test. No varus or valgus laxity. No crepitus.Pain with flexion and extension. TTP along medial aspect. No swelling. No erythema. No anterior tibia pain  on palpation.   Neurological: She is alert and oriented to person, place, and time.  Skin: Skin is warm and dry.  Psychiatric: She has a normal mood and affect. Her speech is normal and behavior is normal. Thought content normal.  Nursing note and vitals reviewed.  ED Treatments / Results  DIAGNOSTIC STUDIES:  Oxygen Saturation is 98% on RA, normal by my interpretation.    COORDINATION OF CARE:  11:16 AM Discussed treatment plan with pt at bedside and pt agreed to plan.  Labs (all labs ordered are listed, but only abnormal results are displayed) Labs Reviewed - No data to display  EKG  EKG Interpretation None       Radiology Dg Knee Complete 4 Views Left  Result Date: 01/05/2017 CLINICAL DATA:  Injury. EXAM: LEFT KNEE - COMPLETE 4+ VIEW COMPARISON:  No recent prior . FINDINGS:  No acute bony or joint abnormality identified. No evidence of fracture or dislocation. IMPRESSION: No acute abnormality. Electronically Signed   By: Marcello Moores  Register   On: 01/05/2017 11:44    Procedures Procedures (including critical care time)  Medications Ordered in ED Medications - No data to display   Initial Impression / Assessment and Plan / ED Course  I have reviewed the triage vital signs and the nursing notes.  Pertinent labs & imaging results that were available during my care of the patient were reviewed by me and considered in my medical decision making (see chart for details).  Final Clinical Impressions(s) / ED Diagnoses    {I have reviewed and evaluated the relevant imaging studies.  {I have reviewed the relevant previous healthcare records.  {I obtained HPI from historian.   ED Course:  Assessment: pt with mechanical fall on ice with direct impact on left knee. No head trauma. Patient X-Ray negative for obvious fracture or dislocation. Likely knee sprain. Also with back pain. No neurological deficits appreciated. Patient is ambulatory. No warning symptoms of back pain including:  fecal incontinence, urinary retention or overflow incontinence, night sweats, waking from sleep with back pain, unexplained fevers or weight loss, h/o cancer, IVDU, recent trauma. No concern for cauda equina, epidural abscess, or other serious cause of back pain. Pt advised to follow up with PCP. Patient given knee brace while in ED, conservative therapy recommended and discussed. Patient will be discharged home & is agreeable with above plan. Returns precautions discussed. Pt appears safe for discharge.  Disposition/Plan:  DC Home Additional Verbal discharge instructions given and discussed with patient.  Pt Instructed to f/u with PCP in the next week for evaluation and treatment of symptoms. Return precautions given Pt acknowledges and agrees with plan  Supervising Physician Forde Dandy, MD  Final diagnoses:  Acute pain of left knee  Acute left-sided low back pain without sciatica  Strain of lumbar region, initial encounter    New Prescriptions New Prescriptions   No medications on file    I personally performed the services described in this documentation, which was scribed in my presence. The recorded information has been reviewed and is accurate.      [name], PA-C 01/05/17 Hamberg Liu, MD 01/05/17 [phone]

## 2017-03-02 ENCOUNTER — Emergency Department (HOSPITAL_COMMUNITY)
Admission: EM | Admit: 2017-03-02 | Discharge: 2017-03-02 | Disposition: A | Discharge status: Home / Self Care | Payer: Medicare Other | Attending: Emergency Medicine | Admitting: Emergency Medicine

## 2017-03-02 ENCOUNTER — Encounter (HOSPITAL_COMMUNITY): Payer: Self-pay | Admitting: Emergency Medicine

## 2017-03-02 DIAGNOSIS — E119 Type 2 diabetes mellitus without complications: Secondary | ICD-10-CM | POA: Insufficient documentation

## 2017-03-02 DIAGNOSIS — J029 Acute pharyngitis, unspecified: Secondary | ICD-10-CM | POA: Diagnosis present

## 2017-03-02 DIAGNOSIS — F17210 Nicotine dependence, cigarettes, uncomplicated: Secondary | ICD-10-CM | POA: Diagnosis not present

## 2017-03-02 DIAGNOSIS — R69 Illness, unspecified: Secondary | ICD-10-CM

## 2017-03-02 DIAGNOSIS — Z7984 Long term (current) use of oral hypoglycemic drugs: Secondary | ICD-10-CM | POA: Diagnosis not present

## 2017-03-02 DIAGNOSIS — I10 Essential (primary) hypertension: Secondary | ICD-10-CM | POA: Insufficient documentation

## 2017-03-02 DIAGNOSIS — B9789 Other viral agents as the cause of diseases classified elsewhere: Secondary | ICD-10-CM

## 2017-03-02 DIAGNOSIS — Z79899 Other long term (current) drug therapy: Secondary | ICD-10-CM | POA: Diagnosis not present

## 2017-03-02 DIAGNOSIS — J111 Influenza due to unidentified influenza virus with other respiratory manifestations: Secondary | ICD-10-CM | POA: Diagnosis not present

## 2017-03-02 DIAGNOSIS — J069 Acute upper respiratory infection, unspecified: Secondary | ICD-10-CM

## 2017-03-02 LAB — RAPID STREP SCREEN (MED CTR MEBANE ONLY): Streptococcus, Group A Screen (Direct): NEGATIVE

## 2017-03-02 MED ORDER — ACETAMINOPHEN 325 MG PO TABS
650.0000 mg | ORAL_TABLET | Freq: Once | ORAL | Status: AC
  Administered 2017-03-02: 650 mg via ORAL
  Filled 2017-03-02: qty 2

## 2017-03-02 MED ORDER — MAGIC MOUTHWASH
5.0000 mL | Freq: Once | ORAL | Status: AC
  Administered 2017-03-02: 5 mL via ORAL
  Filled 2017-03-02: qty 5

## 2017-03-02 NOTE — Discharge Instructions (Signed)
°  We think what you have is a viral syndrome - the treatment for which is symptomatic relief only, and your body will fight the infection off in a few days. °We are prescribing you some meds for pain and fevers. °See your primary care doctor in 1 week if the symptoms dont improve. °

## 2017-03-02 NOTE — ED Notes (Signed)
Per pt her throat has been hurting since sat and she has had a lot of congestion and coughing up phlegm, took a zyrtec this am about 5 and tylenol #3 at 4 because she started to ache all over

## 2017-03-02 NOTE — ED Provider Notes (Signed)
[facility] DEPT Provider Note   CSN: [id number] Arrival date & time: 03/02/17  0744     History   Chief Complaint Chief Complaint  Patient presents with  . Nasal Congestion  . Generalized Body Aches    HPI Margaret Webster is a 56 y.o. female.  HPI  SUBJECTIVE:  Margaret Webster is a 57 y.o. female who complains of congestion, sore throat, post nasal drip, productive cough, cough described as productive, headaches, chills and myalgias for 3 days. She denies a history of fevers, shortness of breath and vomiting and denies a history of asthma. Patient denies smoke cigarettes.   Past Medical History:  Diagnosis Date  . Arthritis   . Bronchitis   . Chronic back pain   . Diabetes mellitus   . Hypertension   . Thyroid disease     Patient Active Problem List   Diagnosis Date Noted  . Chest pain 01/10/2015  . Diabetes mellitus 01/10/2015  . Hypertension 01/10/2015  . Thyroid disease 01/10/2015  . Headache 01/10/2015  . Abdominal pain 01/10/2015  . Vaginitis 04/07/2014  . Pain 04/07/2014  . Vaginal irritation 04/07/2014  . Boil of buttock 04/06/2014  . BV (bacterial vaginosis) 04/06/2014  . Unspecified symptom associated with female genital organs 11/16/2013  . Candidiasis of vulva and vagina 11/16/2013  . Vaginitis and vulvovaginitis, unspecified 06/07/2013  . Pain aggravated by activities of daily living 06/07/2013  . Nonspecific abnormal finding in stool contents 12/02/2011  . Diabetes mellitus without mention of complication 94/17/4081  . Chronic back pain     Past Surgical History:  Procedure Laterality Date  . BACK SURGERY  1993  . CERVICAL SPINE SURGERY Right 11/2012  . OOPHORECTOMY      OB History    No data available       Home Medications    Prior to Admission medications   Medication Sig Start Date End Date Taking? Authorizing Provider  acetaminophen (TYLENOL) 325 MG tablet Take 2 tablets (650 mg total) by mouth every 6 (six) hours as  needed. 01/05/17   [name], PA-C  cyclobenzaprine (FLEXERIL) 10 MG tablet Take 1 tablet (10 mg total) by mouth 2 (two) times daily as needed for muscle spasms. 07/10/16   Joy, Shawn C, PA-C  docusate sodium (COLACE) 100 MG capsule Take 1 capsule (100 mg total) by mouth every 12 (twelve) hours. 03/27/15   Montine Circle, PA-C  levothyroxine (SYNTHROID, LEVOTHROID) 125 MCG tablet Take 125 mcg by mouth daily. 05/23/14   [provider]  lidocaine (LIDODERM) 5 % Place 1 patch onto the skin daily. Remove & Discard patch within 12 hours or as directed by MD 07/10/16   Joy, Shawn C, PA-C  lisinopril (PRINIVIL,ZESTRIL) 20 MG tablet Take 20 mg by mouth daily. 06/18/14   [provider]  LYRICA 75 MG capsule Take 75 mg by mouth daily. 04/26/14   [provider]  metFORMIN (GLUCOPHAGE-XR) 500 MG 24 hr tablet Take 500 mg by mouth daily. 12/04/14   [provider]  methocarbamol (ROBAXIN) 500 MG tablet Take 1 tablet (500 mg total) by mouth 2 (two) times daily. 01/05/17   [name], PA-C  omeprazole (PRILOSEC) 20 MG capsule Take 1 capsule (20 mg total) by mouth daily. 08/12/15   Clayton Bibles, PA-C  oxyCODONE-acetaminophen (PERCOCET) 10-325 MG tablet Take 1 tablet by mouth every 8 (eight) hours as needed for pain. 01/10/16   Wallene Huh, DPM  simvastatin (ZOCOR) 10 MG tablet Take 20 mg by  mouth daily.     [provider]  sodium chloride (OCEAN) 0.65 % SOLN nasal spray Place 1 spray into both nostrils as needed for congestion. 01/11/15   Annita Brod, MD  traMADol (ULTRAM) 50 MG tablet Take 1 tablet (50 mg total) by mouth every 6 (six) hours as needed. 03/21/15   Harris, Abigail, PA-C  ZETIA 10 MG tablet Take 10 mg by mouth daily. 06/18/14   [provider]    Family History Family History  Problem Relation Age of Onset  . Diabetes Mother   . Cystic fibrosis Sister   . Cancer Sister     ?  Marland Kitchen Heart disease Maternal Grandmother   . Clotting disorder  Maternal Grandfather     Social History Social History  Substance Use Topics  . Smoking status: Current Every Day Smoker    Packs/day: 0.50    Types: Cigarettes  . Smokeless tobacco: Never Used  . Alcohol use Yes     Comment: rarely     Allergies   Sulfa antibiotics; Ibuprofen; Influenza vaccines; Lipitor [atorvastatin calcium]; and Naproxen   Review of Systems Review of Systems  Constitutional: Positive for activity change.  HENT: Positive for congestion.   Respiratory: Negative for shortness of breath.   Allergic/Immunologic: Negative for immunocompromised state.     Physical Exam Updated Vital Signs BP 134/83 (BP Location: Left Arm)   Pulse 66   Temp 98.2 F (36.8 C) (Oral)   Resp 18   Ht 5\' 6"  (1.676 m)   Wt 197 lb (89.4 kg)   SpO2 100%   BMI 31.80 kg/m   Physical Exam  Constitutional: She is oriented to person, place, and time. She appears well-developed.  HENT:  Head: Normocephalic and atraumatic.  Eyes: EOM are normal.  Neck: Normal range of motion. Neck supple. No tracheal deviation present.  Pt has no trismus, stridor or crepitus over the neck. Pt has xxx tonsillar enlargement and exudates. Pharynx is erythematous.   Cardiovascular: Normal rate.   Pulmonary/Chest: Effort normal. No stridor.  Abdominal: Bowel sounds are normal.  Lymphadenopathy:    She has cervical adenopathy.  Neurological: She is alert and oriented to person, place, and time.  Skin: Skin is warm and dry.  Nursing note and vitals reviewed.    ED Treatments / Results  Labs (all labs ordered are listed, but only abnormal results are displayed) Labs Reviewed  RAPID STREP SCREEN (NOT AT [facility])  CULTURE, GROUP A STREP [facility])    EKG  EKG Interpretation None       Radiology No results found.  Procedures Procedures (including critical care time)  Medications Ordered in ED Medications  acetaminophen (TYLENOL) tablet 650 mg (650 mg Oral Given 03/02/17 0902)  magic  mouthwash (5 mLs Oral Given 03/02/17 0902)     Initial Impression / Assessment and Plan / ED Course  I have reviewed the triage vital signs and the nursing notes.  Pertinent labs & imaging results that were available during my care of the patient were reviewed by me and considered in my medical decision making (see chart for details).      OBJECTIVE: She appears well, vital signs are as noted. Ears normal.  Throat and pharynx normal.  Neck supple. No adenopathy in the neck. Nose is congested. Sinuses non tender. The chest is clear, without wheezes or rales.  ASSESSMENT:  viral upper respiratory illness and viral pharyngitis  PLAN: Symptomatic therapy suggested: push fluids, rest, gargle warm salt water, use  vaporizer or mist prn and return office visit prn if symptoms persist or worsen. Lack of antibiotic effectiveness discussed with her. Call or return to clinic prn if these symptoms worsen or fail to improve as anticipated.  Final Clinical Impressions(s) / ED Diagnoses   Final diagnoses:  Viral URI with cough  Influenza-like illness  Pharyngitis, unspecified etiology    New Prescriptions Discharge Medication List as of 03/02/2017  9:15 AM       Varney Biles, MD 03/02/17 [phone]

## 2017-03-02 NOTE — ED Triage Notes (Signed)
Pt complaint of productive cough, congestion, and generalized aches since Thursday.

## 2017-03-04 LAB — CULTURE, GROUP A STREP (THRC)

## 2017-03-05 ENCOUNTER — Emergency Department (HOSPITAL_COMMUNITY)
Admission: EM | Admit: 2017-03-05 | Discharge: 2017-03-05 | Disposition: A | Discharge status: Home / Self Care | Payer: Medicare Other | Attending: Emergency Medicine | Admitting: Emergency Medicine

## 2017-03-05 ENCOUNTER — Encounter (HOSPITAL_COMMUNITY): Payer: Self-pay | Admitting: Emergency Medicine

## 2017-03-05 DIAGNOSIS — H9201 Otalgia, right ear: Secondary | ICD-10-CM | POA: Diagnosis present

## 2017-03-05 DIAGNOSIS — I10 Essential (primary) hypertension: Secondary | ICD-10-CM | POA: Insufficient documentation

## 2017-03-05 DIAGNOSIS — H66001 Acute suppurative otitis media without spontaneous rupture of ear drum, right ear: Secondary | ICD-10-CM

## 2017-03-05 DIAGNOSIS — E119 Type 2 diabetes mellitus without complications: Secondary | ICD-10-CM | POA: Insufficient documentation

## 2017-03-05 DIAGNOSIS — Z7984 Long term (current) use of oral hypoglycemic drugs: Secondary | ICD-10-CM | POA: Insufficient documentation

## 2017-03-05 DIAGNOSIS — Z79899 Other long term (current) drug therapy: Secondary | ICD-10-CM | POA: Insufficient documentation

## 2017-03-05 DIAGNOSIS — F17210 Nicotine dependence, cigarettes, uncomplicated: Secondary | ICD-10-CM | POA: Insufficient documentation

## 2017-03-05 MED ORDER — AMOXICILLIN 500 MG PO CAPS: 500.0000 mg | ORAL_CAPSULE | Freq: Three times a day (TID) | ORAL | 0 refills | Status: DC

## 2017-03-05 MED ORDER — KETOROLAC TROMETHAMINE 30 MG/ML IJ SOLN
30.0000 mg | Freq: Once | INTRAMUSCULAR | Status: AC
  Administered 2017-03-05: 30 mg via INTRAMUSCULAR
  Filled 2017-03-05: qty 1

## 2017-03-05 NOTE — Discharge Instructions (Signed)
Please take antibiotics as prescribed. Follow-up closely with PCP. Return for worsening symptoms, including fever, escalating pain, confusion or any other symptoms concerning to you.

## 2017-03-05 NOTE — ED Triage Notes (Signed)
Pt states "I got a bad earache" she woke up at 5am this morning, pt states she was at [facility] for sore throat this week.

## 2017-03-05 NOTE — ED Provider Notes (Signed)
[facility] DEPT Provider Note   CSN: [id number] Arrival date & time: 03/05/17  8119     History   Chief Complaint Chief Complaint  Patient presents with  . Otalgia    HPI Margaret Webster is a 56 y.o. female.  The history is provided by the patient.  Otalgia  This is a new problem. The current episode started 1 to 2 hours ago. There is pain in the right ear. The problem occurs constantly. The problem has not changed since onset.There has been no fever. The pain is severe. Associated symptoms include headaches, rhinorrhea, sore throat and cough. Pertinent negatives include no ear discharge, no abdominal pain, no diarrhea and no vomiting.     56 year old female who presents with right otalgia starting this morning when she woke up. Has recently been treated for viral respiratory illness, with cough, congestion, sore throat, runny nose. States that those symptoms have gradually gotten better over this week, but your pain started today. No drainage. Does not take any medications for symptoms. No fevers or chills. No difficulty breathing, vision or speech changes. Past Medical History:  Diagnosis Date  . Arthritis   . Bronchitis   . Chronic back pain   . Diabetes mellitus   . Hypertension   . Thyroid disease     Patient Active Problem List   Diagnosis Date Noted  . Chest pain 01/10/2015  . Diabetes mellitus 01/10/2015  . Hypertension 01/10/2015  . Thyroid disease 01/10/2015  . Headache 01/10/2015  . Abdominal pain 01/10/2015  . Vaginitis 04/07/2014  . Pain 04/07/2014  . Vaginal irritation 04/07/2014  . Boil of buttock 04/06/2014  . BV (bacterial vaginosis) 04/06/2014  . Unspecified symptom associated with female genital organs 11/16/2013  . Candidiasis of vulva and vagina 11/16/2013  . Vaginitis and vulvovaginitis, unspecified 06/07/2013  . Pain aggravated by activities of daily living 06/07/2013  . Nonspecific abnormal finding in stool contents 12/02/2011  .  Diabetes mellitus without mention of complication 14/78/2956  . Chronic back pain     Past Surgical History:  Procedure Laterality Date  . BACK SURGERY  1993  . CERVICAL SPINE SURGERY Right 11/2012  . OOPHORECTOMY      OB History    No data available       Home Medications    Prior to Admission medications   Medication Sig Start Date End Date Taking? Authorizing Provider  acetaminophen (TYLENOL) 325 MG tablet Take 2 tablets (650 mg total) by mouth every 6 (six) hours as needed. 01/05/17   [name], PA-C  cyclobenzaprine (FLEXERIL) 10 MG tablet Take 1 tablet (10 mg total) by mouth 2 (two) times daily as needed for muscle spasms. 07/10/16   Joy, Shawn C, PA-C  docusate sodium (COLACE) 100 MG capsule Take 1 capsule (100 mg total) by mouth every 12 (twelve) hours. 03/27/15   Montine Circle, PA-C  levothyroxine (SYNTHROID, LEVOTHROID) 125 MCG tablet Take 125 mcg by mouth daily. 05/23/14   [provider]  lidocaine (LIDODERM) 5 % Place 1 patch onto the skin daily. Remove & Discard patch within 12 hours or as directed by MD 07/10/16   Joy, Shawn C, PA-C  lisinopril (PRINIVIL,ZESTRIL) 20 MG tablet Take 20 mg by mouth daily. 06/18/14   [provider]  LYRICA 75 MG capsule Take 75 mg by mouth daily. 04/26/14   [provider]  metFORMIN (GLUCOPHAGE-XR) 500 MG 24 hr tablet Take 500 mg by mouth daily. 12/04/14   [provider]  methocarbamol (  ROBAXIN) 500 MG tablet Take 1 tablet (500 mg total) by mouth 2 (two) times daily. 01/05/17   [name], PA-C  omeprazole (PRILOSEC) 20 MG capsule Take 1 capsule (20 mg total) by mouth daily. 08/12/15   Clayton Bibles, PA-C  oxyCODONE-acetaminophen (PERCOCET) 10-325 MG tablet Take 1 tablet by mouth every 8 (eight) hours as needed for pain. 01/10/16   Wallene Huh, DPM  simvastatin (ZOCOR) 10 MG tablet Take 20 mg by mouth daily.     [provider]  sodium chloride (OCEAN) 0.65 % SOLN nasal spray Place 1 spray into  both nostrils as needed for congestion. 01/11/15   Annita Brod, MD  traMADol (ULTRAM) 50 MG tablet Take 1 tablet (50 mg total) by mouth every 6 (six) hours as needed. 03/21/15   Harris, Abigail, PA-C  ZETIA 10 MG tablet Take 10 mg by mouth daily. 06/18/14   [provider]    Family History Family History  Problem Relation Age of Onset  . Diabetes Mother   . Cystic fibrosis Sister   . Cancer Sister        ?  Marland Kitchen Heart disease Maternal Grandmother   . Clotting disorder Maternal Grandfather     Social History Social History  Substance Use Topics  . Smoking status: Current Every Day Smoker    Packs/day: 0.50    Types: Cigarettes  . Smokeless tobacco: Never Used  . Alcohol use Yes     Comment: rarely     Allergies   Sulfa antibiotics; Ibuprofen; Influenza vaccines; Lipitor [atorvastatin calcium]; and Naproxen   Review of Systems Review of Systems  Constitutional: Negative for fever.  HENT: Positive for ear pain, rhinorrhea and sore throat. Negative for ear discharge.   Eyes: Negative for visual disturbance.  Respiratory: Positive for cough.   Gastrointestinal: Negative for abdominal pain, diarrhea and vomiting.  Neurological: Positive for headaches.  Psychiatric/Behavioral: Negative for confusion.     Physical Exam Updated Vital Signs BP (!) 185/83 (BP Location: Right Arm)   Pulse 74   Temp 98.3 F (36.8 C) (Oral)   Resp 18   Ht 5\' 6"  (1.676 m)   Wt 197 lb (89.4 kg)   SpO2 100%   BMI 31.80 kg/m   Physical Exam Physical Exam  Nursing note and vitals reviewed. Constitutional:  non-toxic, and in no acute distress Head: Normocephalic and atraumatic.  Ear: Right TM appears bulging, erythematous with middle ear effusion. Left TM erythematous without effusion. No mastoid tenderness Mouth/Throat: Oropharynx is clear and moist.  Neck: Normal range of motion. Neck supple. No nuchal rigidity.  Cardiovascular: Normal rate and regular rhythm.     Pulmonary/Chest: Effort normal and breath sounds normal.  Abdominal: Soft. There is no tenderness. There is no rebound and no guarding.  Musculoskeletal: Normal range of motion.  Neurological: Alert, no facial droop, fluent speech, moves all extremities symmetrically Skin: Skin is warm and dry.  Psychiatric: Cooperative   ED Treatments / Results  Labs (all labs ordered are listed, but only abnormal results are displayed) Labs Reviewed - No data to display  EKG  EKG Interpretation None       Radiology No results found.  Procedures Procedures (including critical care time)  Medications Ordered in ED Medications  ketorolac (TORADOL) 30 MG/ML injection 30 mg (not administered)     Initial Impression / Assessment and Plan / ED Course  I have reviewed the triage vital signs and the nursing notes.  Pertinent labs & imaging results  that were available during my care of the patient were reviewed by me and considered in my medical decision making (see chart for details).     Patient is presenting with right ear otalgia this morning in the setting of recent URI symptoms. She is nontoxic, with stable vital signs. There does appear to be early findings of an acute otitis media involving the right ear. No mastoid tenderness noted. Oropharynx is clear, without concerns for deep space soft tissue neck infection by history or exam. Lungs are also clear without rales or wheezing or concerning findings that would suggest pneumonia at this time. We'll treat with a course of amoxicillin. Strict return and follow-up instructions reviewed. She expressed understanding of all discharge instructions and felt comfortable with the plan of care.   Final Clinical Impressions(s) / ED Diagnoses   Final diagnoses:  Acute suppurative otitis media of right ear without spontaneous rupture of tympanic membrane, recurrence not specified    New Prescriptions New Prescriptions   No medications on file      Forde Dandy, MD 03/05/17 [phone]

## 2017-03-29 ENCOUNTER — Encounter (HOSPITAL_COMMUNITY): Payer: Self-pay | Admitting: Emergency Medicine

## 2017-03-29 ENCOUNTER — Emergency Department (HOSPITAL_COMMUNITY)
Admission: EM | Admit: 2017-03-29 | Discharge: 2017-03-29 | Disposition: A | Discharge status: Home / Self Care | Payer: Medicare Other | Attending: Emergency Medicine | Admitting: Emergency Medicine

## 2017-03-29 DIAGNOSIS — J029 Acute pharyngitis, unspecified: Secondary | ICD-10-CM | POA: Insufficient documentation

## 2017-03-29 DIAGNOSIS — J3489 Other specified disorders of nose and nasal sinuses: Secondary | ICD-10-CM

## 2017-03-29 DIAGNOSIS — F17210 Nicotine dependence, cigarettes, uncomplicated: Secondary | ICD-10-CM | POA: Insufficient documentation

## 2017-03-29 DIAGNOSIS — I10 Essential (primary) hypertension: Secondary | ICD-10-CM | POA: Insufficient documentation

## 2017-03-29 DIAGNOSIS — E119 Type 2 diabetes mellitus without complications: Secondary | ICD-10-CM | POA: Diagnosis not present

## 2017-03-29 DIAGNOSIS — H9203 Otalgia, bilateral: Secondary | ICD-10-CM

## 2017-03-29 DIAGNOSIS — Z79899 Other long term (current) drug therapy: Secondary | ICD-10-CM | POA: Diagnosis not present

## 2017-03-29 DIAGNOSIS — J069 Acute upper respiratory infection, unspecified: Secondary | ICD-10-CM

## 2017-03-29 MED ORDER — CETIRIZINE-PSEUDOEPHEDRINE ER 5-120 MG PO TB12: 1.0000 | ORAL_TABLET | Freq: Two times a day (BID) | ORAL | 0 refills | Status: DC | PRN

## 2017-03-29 NOTE — ED Provider Notes (Signed)
[facility] DEPT Provider Note   CSN: [id number] Arrival date & time: 03/29/17  5462     History   Chief Complaint Chief Complaint  Patient presents with  . Otalgia  . Sore Throat    HPI Margaret Webster is a 56 y.o. female.  Patient c/o sore throat, runny nose, bilateral ear ache, intermittently in the past 1-2 months. States had taken a course of an antibiotic, but that didn't seem to help.   Patient is having no trouble breathing or swallowing. No fever or chills. No headache. Denies hx significant seasonal allergies. Patient feels she may need to see specialist. No abrupt/acute worsening this AM.    The history is provided by the patient.  Otalgia  Associated symptoms include rhinorrhea and sore throat. Pertinent negatives include no headaches, no hearing loss, no vomiting, no cough and no rash.  Sore Throat  Pertinent negatives include no chest pain and no headaches.    Past Medical History:  Diagnosis Date  . Arthritis   . Bronchitis   . Chronic back pain   . Diabetes mellitus   . Hypertension   . Thyroid disease     Patient Active Problem List   Diagnosis Date Noted  . Chest pain 01/10/2015  . Diabetes mellitus 01/10/2015  . Hypertension 01/10/2015  . Thyroid disease 01/10/2015  . Headache 01/10/2015  . Abdominal pain 01/10/2015  . Vaginitis 04/07/2014  . Pain 04/07/2014  . Vaginal irritation 04/07/2014  . Boil of buttock 04/06/2014  . BV (bacterial vaginosis) 04/06/2014  . Unspecified symptom associated with female genital organs 11/16/2013  . Candidiasis of vulva and vagina 11/16/2013  . Vaginitis and vulvovaginitis, unspecified 06/07/2013  . Pain aggravated by activities of daily living 06/07/2013  . Nonspecific abnormal finding in stool contents 12/02/2011  . Diabetes mellitus without mention of complication 70/35/0093  . Chronic back pain     Past Surgical History:  Procedure Laterality Date  . BACK SURGERY  1993  . CERVICAL SPINE SURGERY  Right 11/2012  . OOPHORECTOMY      OB History    No data available       Home Medications    Prior to Admission medications   Medication Sig Start Date End Date Taking? Authorizing Provider  acetaminophen (TYLENOL) 325 MG tablet Take 2 tablets (650 mg total) by mouth every 6 (six) hours as needed. 01/05/17   [name], PA-C  amoxicillin (AMOXIL) 500 MG capsule Take 1 capsule (500 mg total) by mouth 3 (three) times daily. 03/05/17   Forde Dandy, MD  cyclobenzaprine (FLEXERIL) 10 MG tablet Take 1 tablet (10 mg total) by mouth 2 (two) times daily as needed for muscle spasms. 07/10/16   Joy, Shawn C, PA-C  docusate sodium (COLACE) 100 MG capsule Take 1 capsule (100 mg total) by mouth every 12 (twelve) hours. 03/27/15   Montine Circle, PA-C  levothyroxine (SYNTHROID, LEVOTHROID) 125 MCG tablet Take 125 mcg by mouth daily. 05/23/14   [provider]  lidocaine (LIDODERM) 5 % Place 1 patch onto the skin daily. Remove & Discard patch within 12 hours or as directed by MD 07/10/16   Joy, Shawn C, PA-C  lisinopril (PRINIVIL,ZESTRIL) 20 MG tablet Take 20 mg by mouth daily. 06/18/14   [provider]  LYRICA 75 MG capsule Take 75 mg by mouth daily. 04/26/14   [provider]  metFORMIN (GLUCOPHAGE-XR) 500 MG 24 hr tablet Take 500 mg by mouth daily. 12/04/14   [provider]  methocarbamol (ROBAXIN) 500 MG tablet Take 1 tablet (500 mg total) by mouth 2 (two) times daily. 01/05/17   [name], PA-C  omeprazole (PRILOSEC) 20 MG capsule Take 1 capsule (20 mg total) by mouth daily. 08/12/15   Clayton Bibles, PA-C  oxyCODONE-acetaminophen (PERCOCET) 10-325 MG tablet Take 1 tablet by mouth every 8 (eight) hours as needed for pain. 01/10/16   Wallene Huh, DPM  simvastatin (ZOCOR) 10 MG tablet Take 20 mg by mouth daily.     [provider]  sodium chloride (OCEAN) 0.65 % SOLN nasal spray Place 1 spray into both nostrils as needed for congestion. 01/11/15   Annita Brod, MD  traMADol (ULTRAM) 50 MG tablet Take 1 tablet (50 mg total) by mouth every 6 (six) hours as needed. 03/21/15   Harris, Abigail, PA-C  ZETIA 10 MG tablet Take 10 mg by mouth daily. 06/18/14   [provider]    Family History Family History  Problem Relation Age of Onset  . Diabetes Mother   . Cystic fibrosis Sister   . Cancer Sister        ?  Marland Kitchen Heart disease Maternal Grandmother   . Clotting disorder Maternal Grandfather     Social History Social History  Substance Use Topics  . Smoking status: Current Every Day Smoker    Packs/day: 0.50    Types: Cigarettes  . Smokeless tobacco: Never Used  . Alcohol use Yes     Comment: rarely     Allergies   Sulfa antibiotics; Ibuprofen; Influenza vaccines; Lipitor [atorvastatin calcium]; and Naproxen   Review of Systems Review of Systems  Constitutional: Negative for fever and unexpected weight change.  HENT: Positive for ear pain, rhinorrhea and sore throat. Negative for hearing loss.   Eyes: Negative for pain, discharge and redness.  Respiratory: Negative for cough.   Cardiovascular: Negative for chest pain.  Gastrointestinal: Negative for vomiting.  Skin: Negative for rash.  Neurological: Negative for headaches.     Physical Exam Updated Vital Signs BP (!) 176/89 (BP Location: Left Arm)   Pulse 62   Temp 98.3 F (36.8 C) (Oral)   Resp 18   Ht 1.676 m (5\' 6" )   Wt 89.4 kg (197 lb)   SpO2 98%   BMI 31.80 kg/m   Physical Exam  Constitutional: She appears well-developed and well-nourished. No distress.  HENT:  Head: Atraumatic.  Right Ear: External ear normal.  Left Ear: External ear normal.  Nose: Nose normal.  Mouth/Throat: Oropharynx is clear and moist.  tms normal. No mastoid tenderness.   Eyes: Conjunctivae are normal. No scleral icterus.  Neck: Neck supple. No tracheal deviation present. No thyromegaly present.  Cardiovascular: Normal rate.   Pulmonary/Chest: Effort normal and breath  sounds normal. No stridor. No respiratory distress.  Abdominal: Normal appearance.  Musculoskeletal: She exhibits no edema.  Lymphadenopathy:    She has no cervical adenopathy.  Neurological: She is alert.  Skin: Skin is warm and dry. No rash noted. She is not diaphoretic.  Psychiatric: She has a normal mood and affect.  Nursing note and vitals reviewed.    ED Treatments / Results  Labs (all labs ordered are listed, but only abnormal results are displayed) Labs Reviewed - No data to display  EKG  EKG Interpretation None       Radiology No results found.  Procedures Procedures (including critical care time)  Medications Ordered in ED Medications - No data to display   Initial Impression /  Assessment and Plan / ED Course  I have reviewed the triage vital signs and the nursing notes.  Pertinent labs & imaging results that were available during my care of the patient were reviewed by me and considered in my medical decision making (see chart for details).    Exam currently appears normal.   Pt indicates 3 ED visits w similar symptoms, and 3 visits to pcp.   No current findings on exam to support acute bacterial infection.   Will try zyrtec d for symptom relief.   Given recurrent symptoms, and inquiry re specialty f/u, will provide referral to ENT.    Final Clinical Impressions(s) / ED Diagnoses   Final diagnoses:  None    New Prescriptions New Prescriptions   No medications on file     Lajean Saver, MD 03/29/17 [phone]

## 2017-03-29 NOTE — Discharge Instructions (Signed)
It was our pleasure to provide your ER care today - we hope that you feel better.  For sinus congestion and/or drainage, you may try zyrtec-d as need for symptom relief.   You may take acetaminophen as need for pain.  Drink adequate fluids.  Avoid any smoking.  For recurrent symptoms, you may follow up with ENT doctor in the next couple weeks - call office Monday to arrange appointment.   Also, follow up with primary care doctor, including for your blood pressure.

## 2017-03-29 NOTE — ED Triage Notes (Signed)
Been seen multiple times for URI, bil ear pain, sinus drainage.  Reports vomiting up phlegm.  Taking tylenol for pain.  Last dose yesterday.

## 2017-04-03 ENCOUNTER — Other Ambulatory Visit (HOSPITAL_COMMUNITY)
Admission: RE | Admit: 2017-04-03 | Discharge: 2017-04-03 | Disposition: A | Discharge status: Home / Self Care | Payer: Medicare Other | Source: Ambulatory Visit | Attending: Obstetrics | Admitting: Obstetrics

## 2017-04-03 ENCOUNTER — Encounter: Payer: Self-pay | Admitting: Obstetrics

## 2017-04-03 ENCOUNTER — Ambulatory Visit (INDEPENDENT_AMBULATORY_CARE_PROVIDER_SITE_OTHER): Payer: Medicare Other | Admitting: Obstetrics

## 2017-04-03 VITALS — BP 133/81 | HR 62 | Ht 66.5 in | Wt 193.0 lb

## 2017-04-03 DIAGNOSIS — Z124 Encounter for screening for malignant neoplasm of cervix: Secondary | ICD-10-CM

## 2017-04-03 DIAGNOSIS — M545 Low back pain, unspecified: Secondary | ICD-10-CM

## 2017-04-03 DIAGNOSIS — Z01419 Encounter for gynecological examination (general) (routine) without abnormal findings: Secondary | ICD-10-CM | POA: Insufficient documentation

## 2017-04-03 DIAGNOSIS — B373 Candidiasis of vulva and vagina: Secondary | ICD-10-CM | POA: Diagnosis not present

## 2017-04-03 DIAGNOSIS — G8929 Other chronic pain: Secondary | ICD-10-CM

## 2017-04-03 DIAGNOSIS — N898 Other specified noninflammatory disorders of vagina: Secondary | ICD-10-CM

## 2017-04-03 DIAGNOSIS — M5090 Cervical disc disorder, unspecified, unspecified cervical region: Secondary | ICD-10-CM

## 2017-04-03 MED ORDER — METHOCARBAMOL 500 MG PO TABS: 500.0000 mg | ORAL_TABLET | Freq: Three times a day (TID) | ORAL | 5 refills | Status: DC | PRN

## 2017-04-03 MED ORDER — OXYCODONE-ACETAMINOPHEN 10-325 MG PO TABS: 1.0000 | ORAL_TABLET | Freq: Three times a day (TID) | ORAL | 0 refills | Status: DC | PRN

## 2017-04-03 NOTE — Progress Notes (Signed)
Subjective:        Margaret Webster is a 56 y.o. female here for a routine exam.  Current complaints: None.    Personal health questionnaire:  Is patient Ashkenazi Jewish, have a family history of breast and/or ovarian cancer: no Is there a family history of uterine cancer diagnosed at age < 48, gastrointestinal cancer, urinary tract cancer, family member who is a Field seismologist syndrome-associated carrier: no Is the patient overweight and hypertensive, family history of diabetes, personal history of gestational diabetes, preeclampsia or PCOS: no Is patient over 64, have PCOS,  family history of premature CHD under age 60, diabetes, smoke, have hypertension or peripheral artery disease:  no At any time, has a partner hit, kicked or otherwise hurt or frightened you?: no Over the past 2 weeks, have you felt down, depressed or hopeless?: no Over the past 2 weeks, have you felt little interest or pleasure in doing things?:no   Gynecologic History No LMP recorded. Patient is postmenopausal. Contraception: post menopausal status Last Pap: 2015. Results were: normal Last mammogram: 2015. Results were: normal  Obstetric History OB History  Gravida Para Term Preterm AB Living  1 0 0 0 0 1  SAB TAB Ectopic Multiple Live Births  0 0 0 0 1    # Outcome Date GA Lbr Len/2nd Weight Sex Delivery Anes PTL Lv  1 Gravida 2059-08-25     Vag-Spont   LIV      Past Medical History:  Diagnosis Date  . Arthritis   . Bronchitis   . Chronic back pain   . Diabetes mellitus   . Hypertension   . Thyroid disease     Past Surgical History:  Procedure Laterality Date  . BACK SURGERY  1993  . CARPAL TUNNEL RELEASE  08/2015  . CERVICAL SPINE SURGERY Right 11/2012  . OOPHORECTOMY       Current Outpatient Prescriptions:  .  cetirizine-pseudoephedrine (ZYRTEC-D) 5-120 MG tablet, Take 1 tablet by mouth 2 (two) times daily as needed for allergies., Disp: 20 tablet, Rfl: 0 .  levothyroxine (SYNTHROID,  LEVOTHROID) 125 MCG tablet, Take 125 mcg by mouth daily., Disp: , Rfl:  .  omeprazole (PRILOSEC) 20 MG capsule, Take 1 capsule (20 mg total) by mouth daily., Disp: 20 capsule, Rfl: 0 .  ZETIA 10 MG tablet, Take 10 mg by mouth daily., Disp: , Rfl:  .  losartan (COZAAR) 25 MG tablet, , Disp: , Rfl:  .  pravastatin (PRAVACHOL) 40 MG tablet, Take 40 mg by mouth., Disp: , Rfl:  Allergies  Allergen Reactions  . Sulfa Antibiotics Other (See Comments)    Hard to breathe  . Ibuprofen     Stomach upset  . Influenza Vaccines Hives  . Lipitor [Atorvastatin Calcium] Other (See Comments)    Muscle ache   . Naproxen Nausea And Vomiting    Social History  Substance Use Topics  . Smoking status: Current Every Day Smoker    Packs/day: 0.50    Types: Cigarettes  . Smokeless tobacco: Never Used  . Alcohol use Yes     Comment: rarely    Family History  Problem Relation Age of Onset  . Diabetes Mother   . Cystic fibrosis Sister   . Cancer Sister        ?  Marland Kitchen Heart disease Maternal Grandmother   . Clotting disorder Maternal Grandfather       Review of Systems  Constitutional: negative for fatigue and weight loss Respiratory: negative for cough  and wheezing Cardiovascular: negative for chest pain, fatigue and palpitations Gastrointestinal: negative for abdominal pain and change in bowel habits Musculoskeletal:positive for myalgias Neurological: negative for gait problems and tremors Behavioral/Psych: negative for abusive relationship, depression Endocrine: negative for temperature intolerance    Genitourinary:negative for abnormal menstrual periods, genital lesions, hot flashes, sexual problems and vaginal discharge Integument/breast: negative for breast lump, breast tenderness, nipple discharge and skin lesion(s)    Objective:       BP 133/81   Pulse 62   Ht 5' 6.5" (1.689 m)   Wt 193 lb (87.5 kg)   BMI 30.68 kg/m  General:   alert  Skin:   no rash or abnormalities  Lungs:    clear to auscultation bilaterally  Heart:   regular rate and rhythm, S1, S2 normal, no murmur, click, rub or gallop  Breasts:   normal without suspicious masses, skin or nipple changes or axillary nodes  Abdomen:  normal findings: no organomegaly, soft, non-tender and no hernia  Pelvis:  External genitalia: normal general appearance Urinary system: urethral meatus normal and bladder without fullness, nontender Vaginal: normal without tenderness, induration or masses Cervix: normal appearance Adnexa: normal bimanual exam Uterus: anteverted and non-tender, normal size   Lab Review Urine pregnancy test Labs reviewed yes Radiologic studies reviewed yes  50% of 20 min visit spent on counseling and coordination of care.    Assessment:    Healthy female exam.    DJD of neck and back with chronic pain   Plan:   Percocet Rx ( Patient has allergy to most NSAIDS ).  She understands that we cannot treat chronic pain.  Education reviewed: calcium supplements, depression evaluation, low fat, low cholesterol diet, safe sex/STD prevention, self breast exams and weight bearing exercise. Contraception: post menopausal status. Mammogram ordered. Follow up in: 1 year.   Meds ordered this encounter  Medications  . losartan (COZAAR) 25 MG tablet  . pravastatin (PRAVACHOL) 40 MG tablet    Sig: Take 40 mg by mouth.   No orders of the defined types were placed in this encounter.    Patient ID: Margaret Webster, female   DOB: 1961-04-23, 56 y.o.   MRN: [id number] Patient ID: Margaret Webster, female   DOB: 04-28-1961, 57 y.o.   MRN: [id number]

## 2017-04-03 NOTE — Addendum Note (Signed)
Addended by: Lucianne Lei on: 04/03/2017 11:49 AM   Modules accepted: Orders

## 2017-04-07 ENCOUNTER — Other Ambulatory Visit: Payer: Self-pay | Admitting: Obstetrics

## 2017-04-07 DIAGNOSIS — B373 Candidiasis of vulva and vagina: Secondary | ICD-10-CM

## 2017-04-07 DIAGNOSIS — B3731 Acute candidiasis of vulva and vagina: Secondary | ICD-10-CM

## 2017-04-07 LAB — CERVICOVAGINAL ANCILLARY ONLY
Bacterial vaginitis: NEGATIVE
Candida vaginitis: POSITIVE — AB

## 2017-04-07 MED ORDER — TERCONAZOLE 0.8 % VA CREA: 1.0000 | TOPICAL_CREAM | Freq: Every day | VAGINAL | 0 refills | Status: DC

## 2017-04-08 LAB — CYTOLOGY - PAP
Diagnosis: NEGATIVE
HPV: NOT DETECTED

## 2017-04-09 ENCOUNTER — Other Ambulatory Visit: Payer: Self-pay | Admitting: Obstetrics

## 2017-05-21 ENCOUNTER — Telehealth: Payer: Self-pay

## 2017-05-21 NOTE — Telephone Encounter (Signed)
Returned call, left vm.

## 2017-07-22 ENCOUNTER — Other Ambulatory Visit: Payer: Self-pay | Admitting: Orthopedic Surgery

## 2017-07-29 ENCOUNTER — Encounter (HOSPITAL_BASED_OUTPATIENT_CLINIC_OR_DEPARTMENT_OTHER): Payer: Self-pay | Admitting: *Deleted

## 2017-08-03 ENCOUNTER — Other Ambulatory Visit: Payer: Self-pay | Admitting: Orthopedic Surgery

## 2017-08-03 ENCOUNTER — Other Ambulatory Visit: Payer: Self-pay

## 2017-08-03 ENCOUNTER — Encounter (HOSPITAL_BASED_OUTPATIENT_CLINIC_OR_DEPARTMENT_OTHER)
Admission: RE | Admit: 2017-08-03 | Discharge: 2017-08-03 | Disposition: A | Discharge status: Home / Self Care | Payer: Medicare Other | Source: Ambulatory Visit | Attending: Orthopedic Surgery | Admitting: Orthopedic Surgery

## 2017-08-03 DIAGNOSIS — Z01810 Encounter for preprocedural cardiovascular examination: Secondary | ICD-10-CM | POA: Diagnosis not present

## 2017-08-03 DIAGNOSIS — Z01818 Encounter for other preprocedural examination: Secondary | ICD-10-CM | POA: Insufficient documentation

## 2017-08-03 LAB — BASIC METABOLIC PANEL
Anion gap: 7 (ref 5–15)
BUN: 11 mg/dL (ref 6–20)
CO2: 29 mmol/L (ref 22–32)
Calcium: 9 mg/dL (ref 8.9–10.3)
Chloride: 101 mmol/L (ref 101–111)
Creatinine, Ser: 1.04 mg/dL — ABNORMAL HIGH (ref 0.44–1.00)
GFR calc Af Amer: >60 mL/min (ref >60)
GFR calc non Af Amer: 58 mL/min — ABNORMAL LOW (ref >60)
Glucose, Bld: 134 mg/dL — ABNORMAL HIGH (ref 65–99)
Potassium: 3.7 mmol/L (ref 3.5–5.1)
Sodium: 137 mmol/L (ref 135–145)

## 2017-08-05 ENCOUNTER — Other Ambulatory Visit: Payer: Self-pay | Admitting: Orthopedic Surgery

## 2017-08-06 ENCOUNTER — Ambulatory Visit (HOSPITAL_BASED_OUTPATIENT_CLINIC_OR_DEPARTMENT_OTHER): Payer: Medicare Other | Admitting: Anesthesiology

## 2017-08-06 ENCOUNTER — Encounter (HOSPITAL_BASED_OUTPATIENT_CLINIC_OR_DEPARTMENT_OTHER)
Admission: RE | Disposition: A | Discharge status: Home / Self Care | Payer: Self-pay | Source: Ambulatory Visit | Attending: Orthopedic Surgery

## 2017-08-06 ENCOUNTER — Ambulatory Visit (HOSPITAL_BASED_OUTPATIENT_CLINIC_OR_DEPARTMENT_OTHER)
Admission: RE | Admit: 2017-08-06 | Discharge: 2017-08-06 | Disposition: A | Discharge status: Home / Self Care | Payer: Medicare Other | Source: Ambulatory Visit | Attending: Orthopedic Surgery | Admitting: Orthopedic Surgery

## 2017-08-06 ENCOUNTER — Encounter (HOSPITAL_BASED_OUTPATIENT_CLINIC_OR_DEPARTMENT_OTHER): Payer: Self-pay | Admitting: *Deleted

## 2017-08-06 DIAGNOSIS — M65311 Trigger thumb, right thumb: Secondary | ICD-10-CM | POA: Insufficient documentation

## 2017-08-06 DIAGNOSIS — F17210 Nicotine dependence, cigarettes, uncomplicated: Secondary | ICD-10-CM | POA: Insufficient documentation

## 2017-08-06 DIAGNOSIS — E119 Type 2 diabetes mellitus without complications: Secondary | ICD-10-CM | POA: Diagnosis not present

## 2017-08-06 DIAGNOSIS — Z79899 Other long term (current) drug therapy: Secondary | ICD-10-CM | POA: Diagnosis not present

## 2017-08-06 DIAGNOSIS — E039 Hypothyroidism, unspecified: Secondary | ICD-10-CM | POA: Diagnosis not present

## 2017-08-06 DIAGNOSIS — I10 Essential (primary) hypertension: Secondary | ICD-10-CM | POA: Insufficient documentation

## 2017-08-06 HISTORY — PX: TRIGGER FINGER RELEASE: SHX641

## 2017-08-06 HISTORY — DX: Trigger thumb, right thumb: M65.311

## 2017-08-06 HISTORY — DX: Hypothyroidism, unspecified: E03.9

## 2017-08-06 LAB — GLUCOSE, CAPILLARY: Glucose-Capillary: 71 mg/dL (ref 65–99)

## 2017-08-06 SURGERY — RELEASE, A1 PULLEY, FOR TRIGGER FINGER
Anesthesia: Regional | Site: Hand | Laterality: Right

## 2017-08-06 MED ORDER — ONDANSETRON HCL 4 MG/2ML IJ SOLN
INTRAMUSCULAR | Status: DC | PRN
  Administered 2017-08-06: 4 mg via INTRAVENOUS

## 2017-08-06 MED ORDER — MIDAZOLAM HCL 2 MG/2ML IJ SOLN
1.0000 mg | INTRAMUSCULAR | Status: DC | PRN
  Administered 2017-08-06 (×2): 1 mg via INTRAVENOUS

## 2017-08-06 MED ORDER — CEFAZOLIN SODIUM-DEXTROSE 2-4 GM/100ML-% IV SOLN
INTRAVENOUS | Status: AC
  Filled 2017-08-06: qty 100

## 2017-08-06 MED ORDER — LACTATED RINGERS IV SOLN
INTRAVENOUS | Status: DC
  Administered 2017-08-06: 11:00:00 via INTRAVENOUS

## 2017-08-06 MED ORDER — CHLORHEXIDINE GLUCONATE 4 % EX LIQD: 60.0000 mL | Freq: Once | CUTANEOUS | Status: DC

## 2017-08-06 MED ORDER — FENTANYL CITRATE (PF) 100 MCG/2ML IJ SOLN
50.0000 ug | INTRAMUSCULAR | Status: DC | PRN
  Administered 2017-08-06 (×2): 50 ug via INTRAVENOUS

## 2017-08-06 MED ORDER — OXYCODONE HCL 5 MG PO TABS
5.0000 mg | ORAL_TABLET | Freq: Once | ORAL | Status: AC | PRN
  Administered 2017-08-06: 5 mg via ORAL

## 2017-08-06 MED ORDER — CEFAZOLIN SODIUM-DEXTROSE 2-4 GM/100ML-% IV SOLN
2.0000 g | INTRAVENOUS | Status: AC
  Administered 2017-08-06: 2 g via INTRAVENOUS

## 2017-08-06 MED ORDER — OXYCODONE HCL 5 MG PO TABS
ORAL_TABLET | ORAL | Status: AC
  Filled 2017-08-06: qty 1

## 2017-08-06 MED ORDER — MEPERIDINE HCL 25 MG/ML IJ SOLN: 6.2500 mg | INTRAMUSCULAR | Status: DC | PRN

## 2017-08-06 MED ORDER — BUPIVACAINE HCL (PF) 0.25 % IJ SOLN
INTRAMUSCULAR | Status: DC | PRN
  Administered 2017-08-06: 5 mL

## 2017-08-06 MED ORDER — FENTANYL CITRATE (PF) 100 MCG/2ML IJ SOLN: 25.0000 ug | INTRAMUSCULAR | Status: DC | PRN

## 2017-08-06 MED ORDER — 0.9 % SODIUM CHLORIDE (POUR BTL) OPTIME
TOPICAL | Status: DC | PRN
  Administered 2017-08-06: 100 mL

## 2017-08-06 MED ORDER — MIDAZOLAM HCL 2 MG/2ML IJ SOLN
INTRAMUSCULAR | Status: AC
  Filled 2017-08-06: qty 2

## 2017-08-06 MED ORDER — FENTANYL CITRATE (PF) 100 MCG/2ML IJ SOLN
INTRAMUSCULAR | Status: AC
  Filled 2017-08-06: qty 2

## 2017-08-06 MED ORDER — OXYCODONE HCL 5 MG/5ML PO SOLN: 5.0000 mg | Freq: Once | ORAL | Status: AC | PRN

## 2017-08-06 MED ORDER — PROMETHAZINE HCL 25 MG/ML IJ SOLN: 6.2500 mg | INTRAMUSCULAR | Status: DC | PRN

## 2017-08-06 MED ORDER — SCOPOLAMINE 1 MG/3DAYS TD PT72: 1.0000 | MEDICATED_PATCH | Freq: Once | TRANSDERMAL | Status: DC | PRN

## 2017-08-06 MED ORDER — HYDROCODONE-ACETAMINOPHEN 5-325 MG PO TABS: ORAL_TABLET | ORAL | 0 refills | Status: DC

## 2017-08-06 MED ORDER — LIDOCAINE HCL (PF) 0.5 % IJ SOLN
INTRAMUSCULAR | Status: DC | PRN
  Administered 2017-08-06: 35 mL via INTRAVENOUS

## 2017-08-06 MED ORDER — PROPOFOL 500 MG/50ML IV EMUL
INTRAVENOUS | Status: DC | PRN
  Administered 2017-08-06: 75 ug/kg/min via INTRAVENOUS

## 2017-08-06 SURGICAL SUPPLY — 34 items
BANDAGE COBAN STERILE 2 (GAUZE/BANDAGES/DRESSINGS) ×2 IMPLANT
BLADE SURG 15 STRL LF DISP TIS (BLADE) ×2 IMPLANT
BLADE SURG 15 STRL SS (BLADE) ×4
BNDG CMPR 9X4 STRL LF SNTH (GAUZE/BANDAGES/DRESSINGS)
BNDG CONFORM 2 STRL LF (GAUZE/BANDAGES/DRESSINGS) ×2 IMPLANT
BNDG ESMARK 4X9 LF (GAUZE/BANDAGES/DRESSINGS) IMPLANT
CHLORAPREP W/TINT 26ML (MISCELLANEOUS) ×2 IMPLANT
CORD BIPOLAR FORCEPS 12FT (ELECTRODE) ×2 IMPLANT
COVER BACK TABLE 60X90IN (DRAPES) ×2 IMPLANT
COVER MAYO STAND STRL (DRAPES) ×2 IMPLANT
CUFF TOURNIQUET SINGLE 18IN (TOURNIQUET CUFF) ×2 IMPLANT
DRAPE EXTREMITY T 121X128X90 (DRAPE) ×2 IMPLANT
DRAPE SURG 17X23 STRL (DRAPES) ×2 IMPLANT
GAUZE SPONGE 4X4 12PLY STRL (GAUZE/BANDAGES/DRESSINGS) ×2 IMPLANT
GAUZE XEROFORM 1X8 LF (GAUZE/BANDAGES/DRESSINGS) ×2 IMPLANT
GLOVE BIO SURGEON STRL SZ7.5 (GLOVE) ×2 IMPLANT
GLOVE BIOGEL PI IND STRL 7.0 (GLOVE) IMPLANT
GLOVE BIOGEL PI IND STRL 8 (GLOVE) ×1 IMPLANT
GLOVE BIOGEL PI INDICATOR 7.0 (GLOVE) ×2
GLOVE BIOGEL PI INDICATOR 8 (GLOVE) ×1
GLOVE ECLIPSE 6.5 STRL STRAW (GLOVE) ×1 IMPLANT
GOWN STRL REUS W/ TWL LRG LVL3 (GOWN DISPOSABLE) ×1 IMPLANT
GOWN STRL REUS W/TWL LRG LVL3 (GOWN DISPOSABLE) ×2
GOWN STRL REUS W/TWL XL LVL3 (GOWN DISPOSABLE) ×2 IMPLANT
NDL HYPO 25X1 1.5 SAFETY (NEEDLE) ×1 IMPLANT
NEEDLE HYPO 25X1 1.5 SAFETY (NEEDLE) ×2 IMPLANT
NS IRRIG 1000ML POUR BTL (IV SOLUTION) ×2 IMPLANT
PACK BASIN DAY SURGERY FS (CUSTOM PROCEDURE TRAY) ×2 IMPLANT
STOCKINETTE 4X48 STRL (DRAPES) ×2 IMPLANT
SUT ETHILON 4 0 PS 2 18 (SUTURE) ×2 IMPLANT
SYR BULB 3OZ (MISCELLANEOUS) ×2 IMPLANT
SYR CONTROL 10ML LL (SYRINGE) ×2 IMPLANT
TOWEL OR 17X24 6PK STRL BLUE (TOWEL DISPOSABLE) ×4 IMPLANT
UNDERPAD 30X30 (UNDERPADS AND DIAPERS) ×2 IMPLANT

## 2017-08-06 NOTE — Anesthesia Preprocedure Evaluation (Signed)
Anesthesia Evaluation  Patient identified by MRN, date of birth, ID band Patient awake    Reviewed: Allergy & Precautions, NPO status , Patient's Chart, lab work & pertinent test results  Airway Mallampati: II  TM Distance: >3 FB Neck ROM: Full    Dental no notable dental hx.    Pulmonary Current Smoker,    Pulmonary exam normal breath sounds clear to auscultation       Cardiovascular hypertension, Normal cardiovascular exam Rhythm:Regular Rate:Normal     Neuro/Psych  Headaches, negative psych ROS   GI/Hepatic negative GI ROS, Neg liver ROS,   Endo/Other  diabetesHypothyroidism   Renal/GU negative Renal ROS     Musculoskeletal  (+) Arthritis ,   Abdominal   Peds  Hematology negative hematology ROS (+)   Anesthesia Other Findings   Reproductive/Obstetrics negative OB ROS                             Anesthesia Physical Anesthesia Plan  ASA: II  Anesthesia Plan: Bier Block   Post-op Pain Management:    Induction: Intravenous  PONV Risk Score and Plan: 1 and Ondansetron and Propofol infusion  Airway Management Planned:   Additional Equipment:   Intra-op Plan:   Post-operative Plan:   Informed Consent: I have reviewed the patients History and Physical, chart, labs and discussed the procedure including the risks, benefits and alternatives for the proposed anesthesia with the patient or authorized representative who has indicated his/her understanding and acceptance.   Dental advisory given  Plan Discussed with: CRNA  Anesthesia Plan Comments:         Anesthesia Quick Evaluation

## 2017-08-06 NOTE — Brief Op Note (Signed)
08/06/2017  12:47 PM  PATIENT:  [name]  56 y.o. female  PRE-OPERATIVE DIAGNOSIS:  RIGHT THUMB TRIGGER FINGER M65.311  POST-OPERATIVE DIAGNOSIS:  RIGHT THUMB TRIGGER FINGER M65.311  PROCEDURE:  Procedure(s): RIGHT THUMB TRIGGER RELEASE (Right)  SURGEON:  Surgeon(s) and Role:    Leanora Cover, MD - Primary  PHYSICIAN ASSISTANT:   ASSISTANTS: none   ANESTHESIA:   Bier block with sedation  EBL:  No intake/output data recorded.  BLOOD ADMINISTERED:none  DRAINS: none   LOCAL MEDICATIONS USED:  MARCAINE     SPECIMEN:  No Specimen  DISPOSITION OF SPECIMEN:  N/A  COUNTS:  YES  TOURNIQUET:   Total Tourniquet Time Documented: Forearm (Right) - 21 minutes Total: Forearm (Right) - 21 minutes   DICTATION: .Note written in EPIC  PLAN OF CARE: Discharge to home after PACU  PATIENT DISPOSITION:  PACU - hemodynamically stable.

## 2017-08-06 NOTE — Discharge Instructions (Addendum)

## 2017-08-06 NOTE — Op Note (Signed)
08/06/2017 [facility] OPERATIVE REPORT   PREOPERATIVE DIAGNOSIS: Right trigger thumb.  POSTOPERATIVE DIAGNOSIS:  Right trigger thumb.  PROCEDURE: Right trigger thumb release.  SURGEON:  Leanora Cover, MD  ASSISTANT:  None.  ANESTHESIA:  Bier block and sedation.  IV FLUIDS:  Per anesthesia flow sheet.  ESTIMATED BLOOD LOSS:  Minimal.  COMPLICATIONS:  None.  SPECIMENS:  None.  TOURNIQUET TIME:  Total Tourniquet Time Documented: Forearm (Right) - 21 minutes Total: Forearm (Right) - 21 minutes   DISPOSITION:  Stable to PACU.  LOCATION: [facility]  INDICATIONS: Margaret Webster is a 56 y.o. female with triggering of right thumb.  This has not resolved with conservative treatment.  She wishes to have a trigger thumb release.  Risks, benefits and alternatives of surgery were discussed including the risk of blood loss, infection, damage to nerves, vessels, tendons, ligaments, bone, failure of surgery, need for additional surgery, complications with wound healing, continued pain, continued triggering and need for repeat surgery.  She voiced understanding of these risks and elected to proceed.  OPERATIVE COURSE:  After being identified preoperatively by myself, the patient and I agreed upon the procedure and site of procedure.  The surgical site was marked. The risks, benefits, and alternatives of surgery were reviewed and she wished to proceed.  Surgical consent had been signed. She was given IV Ancef as preoperative antibiotic prophylaxis. She was transported to the operating room and placed on the operating room table in supine position with the Right upper extremity on an arm board. Bier block and sedation was induced by the anesthesiologist.  The Right upper extremity was prepped and draped in normal sterile orthopedic fashion. A surgical pause was performed between surgeons, anesthesia, and operating room staff, and all were in agreement as to the patient,  procedure, and site of procedure.  Tourniquet at the proximal aspect of the forearm had been inflated for the Bier block.   An incision was made transversely at the MP flexion crease of the thumb.  This was made through the skin only.  Spreading technique was used.  The radial and ulnar digital nerves were identified and were protected throughout the case. The flexor sheath was identified.  The A1 pulley was identified.  It was sharply incised.  It was released in its entirety.  Care was taken to avoid any release of the oblique pulley. The thumb was placed through a range of motion, there was noted to be no catching.  The wound was copiously irrigated with sterile saline. It was then closed with 4-0 nylon in a horizontal mattress fashion.  The wound was injected with  0.25% plain Marcaine to aid in postoperative analgesia.  It was then dressed with sterile Xeroform, 4x4s, and wrapped lightly with a Coban dressing.  Tourniquet was deflated at 21 minutes.  The fingertips were pink with brisk capillary refill after deflation of the tourniquet.  The operative drapes were broken down and the patient was awoken from anesthesia safely.  She was transferred back to the stretcher and taken to the PACU in stable condition.   I will see her back in the office in 1 week for postoperative followup.  I will give her a prescription for norco 5/325 1-2 tabs po q6 hours prn pain, dispense #20.    Tennis Must, MD Electronically signed, 08/06/17

## 2017-08-06 NOTE — H&P (Signed)
  Margaret Webster is an 56 y.o. female.   Chief Complaint: right trigger thumb HPI: 56 yo female with right trigger thumb.  This has been injected without lasting relief.  She wishes to have right thumb trigger release.  Allergies:  Allergies  Allergen Reactions  . Sulfa Antibiotics Other (See Comments)    Hard to breathe  . Ibuprofen     Stomach upset  . Influenza Vaccines Hives  . Lipitor [Atorvastatin Calcium] Other (See Comments)    Muscle ache   . Naproxen Nausea And Vomiting    Past Medical History:  Diagnosis Date  . Arthritis    bil shoulders  . Bronchitis   . Chronic back pain   . Diabetes mellitus    stopped Mrtformin 1 mo ago, made her "sick"  . Hypertension   . Hypothyroidism   . Thyroid disease   . Trigger thumb of right hand     Past Surgical History:  Procedure Laterality Date  . BACK SURGERY  1993  . CARPAL TUNNEL RELEASE  08/2015  . CERVICAL SPINE SURGERY Right 11/2012  . OOPHORECTOMY      Family History: Family History  Problem Relation Age of Onset  . Diabetes Mother   . Cystic fibrosis Sister   . Cancer Sister        ?  Marland Kitchen Heart disease Maternal Grandmother   . Clotting disorder Maternal Grandfather     Social History:   reports that she has been smoking Cigarettes.  She has been smoking about 0.50 packs per day. She has never used smokeless tobacco. She reports that she uses drugs, including Marijuana. She reports that she does not drink alcohol.  Medications: Medications Prior to Admission  Medication Sig Dispense Refill  . levothyroxine (SYNTHROID, LEVOTHROID) 125 MCG tablet Take 125 mcg by mouth daily.    Marland Kitchen losartan (COZAAR) 25 MG tablet     . pravastatin (PRAVACHOL) 40 MG tablet Take 40 mg by mouth.    . oxyCODONE-acetaminophen (PERCOCET) 10-325 MG tablet Take 1 tablet by mouth every 8 (eight) hours as needed for pain. 30 tablet 0  . terconazole (TERAZOL 3) 0.8 % vaginal cream Place 1 applicator vaginally at bedtime. 20 g 0  .  ZETIA 10 MG tablet Take 10 mg by mouth daily.      No results found for this or any previous visit (from the past 48 hour(s)).  No results found.   A comprehensive review of systems was negative.  Blood pressure 138/70, pulse (!) 54, temperature 97.9 F (36.6 C), temperature source Oral, resp. rate 18, height 5\' 6"  (1.676 m), weight 88.6 kg (195 lb 6 oz), SpO2 98 %.  General appearance: alert, cooperative and appears stated age Head: Normocephalic, without obvious abnormality, atraumatic Neck: supple, symmetrical, trachea midline Resp: clear to auscultation bilaterally Cardio: regular rate and rhythm GI: non-tender Extremities: Intact sensation and capillary refill all digits.  +epl/fpl/io.  No wounds.  Pulses: 2+ and symmetric Skin: Skin color, texture, turgor normal. No rashes or lesions Neurologic: Grossly normal Incision/Wound:none  Assessment/Plan Right trigger thumb.  Non operative and operative treatment options were discussed with the patient and patient wishes to proceed with operative treatment. Risks, benefits, and alternatives of surgery were discussed and the patient agrees with the plan of care.   Krissie Merrick R 08/06/2017, 11:56 AM

## 2017-08-06 NOTE — Transfer of Care (Signed)
Immediate Anesthesia Transfer of Care Note  Patient: Margaret Webster  Procedure(s) Performed: RIGHT THUMB TRIGGER RELEASE (Right Hand)  Patient Location: PACU  Anesthesia Type:MAC and Bier block  Level of Consciousness: awake, alert  and oriented  Airway & Oxygen Therapy: Patient Spontanous Breathing and Patient connected to face mask oxygen  Post-op Assessment: Report given to RN and Post -op Vital signs reviewed and stable  Post vital signs: Reviewed and stable  Last Vitals:  Vitals:   08/06/17 1053  BP: 138/70  Pulse: (!) 54  Resp: 18  Temp: 36.6 C  SpO2: 98%    Last Pain:  Vitals:   08/06/17 1053  TempSrc: Oral      Patients Stated Pain Goal: 0 (33/38/32 [id number])  Complications: No apparent anesthesia complications

## 2017-08-06 NOTE — Anesthesia Postprocedure Evaluation (Signed)
Anesthesia Post Note  Patient: Margaret Webster  Procedure(s) Performed: RIGHT THUMB TRIGGER RELEASE (Right Hand)     Patient location during evaluation: PACU Anesthesia Type: Bier Block Level of consciousness: awake and alert Pain management: pain level controlled Vital Signs Assessment: post-procedure vital signs reviewed and stable Respiratory status: spontaneous breathing Cardiovascular status: stable Anesthetic complications: no    Last Vitals:  Vitals:   08/06/17 1315 08/06/17 1348  BP: (!) 151/91 124/72  Pulse: (!) 50 78  Resp: 17 20  Temp:  36.5 C  SpO2: 100% 100%    Last Pain:  Vitals:   08/06/17 1348  TempSrc:   PainSc: [place]

## 2017-08-07 ENCOUNTER — Encounter (HOSPITAL_BASED_OUTPATIENT_CLINIC_OR_DEPARTMENT_OTHER): Payer: Self-pay | Admitting: Orthopedic Surgery

## 2017-08-07 NOTE — Addendum Note (Signed)
Addendum  created 08/07/17 1008 by Erron Wengert, Ernesta Amble, CRNA   Charge Capture section accepted

## 2017-09-02 ENCOUNTER — Other Ambulatory Visit: Payer: Self-pay | Admitting: Internal Medicine

## 2017-09-02 DIAGNOSIS — Z1231 Encounter for screening mammogram for malignant neoplasm of breast: Secondary | ICD-10-CM

## 2017-09-02 DIAGNOSIS — E2839 Other primary ovarian failure: Secondary | ICD-10-CM

## 2017-09-04 ENCOUNTER — Other Ambulatory Visit: Payer: Self-pay | Admitting: Orthopedic Surgery

## 2017-09-08 ENCOUNTER — Encounter (HOSPITAL_BASED_OUTPATIENT_CLINIC_OR_DEPARTMENT_OTHER): Payer: Self-pay | Admitting: *Deleted

## 2017-09-08 ENCOUNTER — Other Ambulatory Visit: Payer: Self-pay

## 2017-09-10 ENCOUNTER — Ambulatory Visit (HOSPITAL_BASED_OUTPATIENT_CLINIC_OR_DEPARTMENT_OTHER): Payer: Medicare Other | Admitting: Certified Registered Nurse Anesthetist

## 2017-09-10 ENCOUNTER — Encounter (HOSPITAL_BASED_OUTPATIENT_CLINIC_OR_DEPARTMENT_OTHER): Payer: Self-pay

## 2017-09-10 ENCOUNTER — Encounter (HOSPITAL_BASED_OUTPATIENT_CLINIC_OR_DEPARTMENT_OTHER)
Admission: RE | Disposition: A | Discharge status: Home / Self Care | Payer: Self-pay | Source: Ambulatory Visit | Attending: Orthopedic Surgery

## 2017-09-10 ENCOUNTER — Other Ambulatory Visit: Payer: Self-pay

## 2017-09-10 ENCOUNTER — Ambulatory Visit (HOSPITAL_BASED_OUTPATIENT_CLINIC_OR_DEPARTMENT_OTHER)
Admission: RE | Admit: 2017-09-10 | Discharge: 2017-09-10 | Disposition: A | Discharge status: Home / Self Care | Payer: Medicare Other | Source: Ambulatory Visit | Attending: Orthopedic Surgery | Admitting: Orthopedic Surgery

## 2017-09-10 ENCOUNTER — Ambulatory Visit (HOSPITAL_BASED_OUTPATIENT_CLINIC_OR_DEPARTMENT_OTHER): Admit: 2017-09-10 | Payer: Medicare Other | Admitting: Orthopedic Surgery

## 2017-09-10 DIAGNOSIS — Z886 Allergy status to analgesic agent status: Secondary | ICD-10-CM | POA: Diagnosis not present

## 2017-09-10 DIAGNOSIS — M19011 Primary osteoarthritis, right shoulder: Secondary | ICD-10-CM | POA: Diagnosis not present

## 2017-09-10 DIAGNOSIS — Z887 Allergy status to serum and vaccine status: Secondary | ICD-10-CM | POA: Insufficient documentation

## 2017-09-10 DIAGNOSIS — G5602 Carpal tunnel syndrome, left upper limb: Secondary | ICD-10-CM | POA: Diagnosis not present

## 2017-09-10 DIAGNOSIS — Z79899 Other long term (current) drug therapy: Secondary | ICD-10-CM | POA: Insufficient documentation

## 2017-09-10 DIAGNOSIS — Z882 Allergy status to sulfonamides status: Secondary | ICD-10-CM | POA: Diagnosis not present

## 2017-09-10 DIAGNOSIS — E039 Hypothyroidism, unspecified: Secondary | ICD-10-CM | POA: Insufficient documentation

## 2017-09-10 DIAGNOSIS — Z8249 Family history of ischemic heart disease and other diseases of the circulatory system: Secondary | ICD-10-CM | POA: Insufficient documentation

## 2017-09-10 DIAGNOSIS — I10 Essential (primary) hypertension: Secondary | ICD-10-CM | POA: Diagnosis not present

## 2017-09-10 DIAGNOSIS — Z888 Allergy status to other drugs, medicaments and biological substances status: Secondary | ICD-10-CM | POA: Insufficient documentation

## 2017-09-10 DIAGNOSIS — M19012 Primary osteoarthritis, left shoulder: Secondary | ICD-10-CM | POA: Diagnosis not present

## 2017-09-10 DIAGNOSIS — F17210 Nicotine dependence, cigarettes, uncomplicated: Secondary | ICD-10-CM | POA: Diagnosis not present

## 2017-09-10 DIAGNOSIS — E119 Type 2 diabetes mellitus without complications: Secondary | ICD-10-CM | POA: Diagnosis not present

## 2017-09-10 HISTORY — PX: CARPAL TUNNEL RELEASE: SHX101

## 2017-09-10 SURGERY — CARPAL TUNNEL RELEASE
Anesthesia: Choice | Laterality: Left

## 2017-09-10 SURGERY — CARPAL TUNNEL RELEASE
Anesthesia: Monitor Anesthesia Care | Site: Wrist | Laterality: Left

## 2017-09-10 MED ORDER — CHLORHEXIDINE GLUCONATE 4 % EX LIQD: 60.0000 mL | Freq: Once | CUTANEOUS | Status: DC

## 2017-09-10 MED ORDER — OXYCODONE-ACETAMINOPHEN 5-325 MG PO TABS: ORAL_TABLET | ORAL | 0 refills | Status: DC

## 2017-09-10 MED ORDER — ONDANSETRON HCL 4 MG/2ML IJ SOLN
INTRAMUSCULAR | Status: DC | PRN
  Administered 2017-09-10: 4 mg via INTRAVENOUS

## 2017-09-10 MED ORDER — PROPOFOL 10 MG/ML IV BOLUS
INTRAVENOUS | Status: DC | PRN
  Administered 2017-09-10: 20 mg via INTRAVENOUS

## 2017-09-10 MED ORDER — CEFAZOLIN SODIUM-DEXTROSE 2-4 GM/100ML-% IV SOLN
INTRAVENOUS | Status: AC
  Filled 2017-09-10: qty 100

## 2017-09-10 MED ORDER — BUPIVACAINE HCL (PF) 0.25 % IJ SOLN
INTRAMUSCULAR | Status: DC | PRN
  Administered 2017-09-10: 10 mL

## 2017-09-10 MED ORDER — DEXAMETHASONE SODIUM PHOSPHATE 10 MG/ML IJ SOLN
INTRAMUSCULAR | Status: AC
  Filled 2017-09-10: qty 1

## 2017-09-10 MED ORDER — LIDOCAINE HCL (CARDIAC) 20 MG/ML IV SOLN
INTRAVENOUS | Status: DC | PRN
  Administered 2017-09-10: 20 mg via INTRAVENOUS

## 2017-09-10 MED ORDER — CEFAZOLIN SODIUM-DEXTROSE 2-4 GM/100ML-% IV SOLN
2.0000 g | INTRAVENOUS | Status: AC
  Administered 2017-09-10: 2 g via INTRAVENOUS
  Filled 2017-09-10: qty 100

## 2017-09-10 MED ORDER — LIDOCAINE HCL (PF) 0.5 % IJ SOLN
INTRAMUSCULAR | Status: DC | PRN
  Administered 2017-09-10: 35 mL via INTRAVENOUS

## 2017-09-10 MED ORDER — LACTATED RINGERS IV SOLN
INTRAVENOUS | Status: DC
  Administered 2017-09-10 (×2): via INTRAVENOUS

## 2017-09-10 MED ORDER — 0.9 % SODIUM CHLORIDE (POUR BTL) OPTIME
TOPICAL | Status: DC | PRN
  Administered 2017-09-10: 200 mL

## 2017-09-10 MED ORDER — HYDROMORPHONE HCL 1 MG/ML IJ SOLN: 0.2500 mg | INTRAMUSCULAR | Status: DC | PRN

## 2017-09-10 MED ORDER — MIDAZOLAM HCL 2 MG/2ML IJ SOLN
1.0000 mg | INTRAMUSCULAR | Status: DC | PRN
  Administered 2017-09-10: 2 mg via INTRAVENOUS

## 2017-09-10 MED ORDER — ONDANSETRON HCL 4 MG/2ML IJ SOLN
INTRAMUSCULAR | Status: AC
  Filled 2017-09-10: qty 2

## 2017-09-10 MED ORDER — SCOPOLAMINE 1 MG/3DAYS TD PT72: 1.0000 | MEDICATED_PATCH | Freq: Once | TRANSDERMAL | Status: DC | PRN

## 2017-09-10 MED ORDER — MIDAZOLAM HCL 2 MG/2ML IJ SOLN
INTRAMUSCULAR | Status: AC
  Filled 2017-09-10: qty 2

## 2017-09-10 MED ORDER — FENTANYL CITRATE (PF) 100 MCG/2ML IJ SOLN
INTRAMUSCULAR | Status: AC
  Filled 2017-09-10: qty 2

## 2017-09-10 MED ORDER — PROMETHAZINE HCL 25 MG/ML IJ SOLN: 6.2500 mg | INTRAMUSCULAR | Status: DC | PRN

## 2017-09-10 MED ORDER — FENTANYL CITRATE (PF) 100 MCG/2ML IJ SOLN
50.0000 ug | INTRAMUSCULAR | Status: DC | PRN
  Administered 2017-09-10 (×2): 50 ug via INTRAVENOUS

## 2017-09-10 SURGICAL SUPPLY — 37 items
BANDAGE ACE 3X5.8 VEL STRL LF (GAUZE/BANDAGES/DRESSINGS) ×2 IMPLANT
BLADE SURG 15 STRL LF DISP TIS (BLADE) ×2 IMPLANT
BLADE SURG 15 STRL SS (BLADE) ×4
BNDG CMPR 9X4 STRL LF SNTH (GAUZE/BANDAGES/DRESSINGS)
BNDG ESMARK 4X9 LF (GAUZE/BANDAGES/DRESSINGS) IMPLANT
BNDG GAUZE ELAST 4 BULKY (GAUZE/BANDAGES/DRESSINGS) ×2 IMPLANT
CHLORAPREP W/TINT 26ML (MISCELLANEOUS) ×2 IMPLANT
CORD BIPOLAR FORCEPS 12FT (ELECTRODE) ×2 IMPLANT
COVER BACK TABLE 60X90IN (DRAPES) ×2 IMPLANT
COVER MAYO STAND STRL (DRAPES) ×2 IMPLANT
CUFF TOURNIQUET SINGLE 18IN (TOURNIQUET CUFF) ×2 IMPLANT
DRAPE EXTREMITY T 121X128X90 (DRAPE) ×2 IMPLANT
DRAPE SURG 17X23 STRL (DRAPES) ×2 IMPLANT
DRSG PAD ABDOMINAL 8X10 ST (GAUZE/BANDAGES/DRESSINGS) ×2 IMPLANT
GAUZE SPONGE 4X4 12PLY STRL (GAUZE/BANDAGES/DRESSINGS) ×2 IMPLANT
GAUZE XEROFORM 1X8 LF (GAUZE/BANDAGES/DRESSINGS) ×2 IMPLANT
GLOVE BIO SURGEON STRL SZ7.5 (GLOVE) ×2 IMPLANT
GLOVE BIOGEL PI IND STRL 7.0 (GLOVE) IMPLANT
GLOVE BIOGEL PI IND STRL 8 (GLOVE) ×1 IMPLANT
GLOVE BIOGEL PI INDICATOR 7.0 (GLOVE) ×2
GLOVE BIOGEL PI INDICATOR 8 (GLOVE) ×1
GLOVE ECLIPSE 6.5 STRL STRAW (GLOVE) ×1 IMPLANT
GOWN STRL REUS W/ TWL LRG LVL3 (GOWN DISPOSABLE) ×1 IMPLANT
GOWN STRL REUS W/TWL LRG LVL3 (GOWN DISPOSABLE) ×2
GOWN STRL REUS W/TWL XL LVL3 (GOWN DISPOSABLE) ×2 IMPLANT
NDL HYPO 25X1 1.5 SAFETY (NEEDLE) ×1 IMPLANT
NEEDLE HYPO 25X1 1.5 SAFETY (NEEDLE) ×2 IMPLANT
NS IRRIG 1000ML POUR BTL (IV SOLUTION) ×2 IMPLANT
PACK BASIN DAY SURGERY FS (CUSTOM PROCEDURE TRAY) ×2 IMPLANT
PADDING CAST ABS 4INX4YD NS (CAST SUPPLIES)
PADDING CAST ABS COTTON 4X4 ST (CAST SUPPLIES) ×1 IMPLANT
STOCKINETTE 4X48 STRL (DRAPES) ×2 IMPLANT
SUT ETHILON 4 0 PS 2 18 (SUTURE) ×2 IMPLANT
SYR BULB 3OZ (MISCELLANEOUS) ×2 IMPLANT
SYR CONTROL 10ML LL (SYRINGE) ×2 IMPLANT
TOWEL OR 17X24 6PK STRL BLUE (TOWEL DISPOSABLE) ×4 IMPLANT
UNDERPAD 30X30 (UNDERPADS AND DIAPERS) ×2 IMPLANT

## 2017-09-10 NOTE — Discharge Instructions (Addendum)

## 2017-09-10 NOTE — Brief Op Note (Signed)
09/10/2017  2:19 PM  PATIENT:  Margaret Webster  56 y.o. female  PRE-OPERATIVE DIAGNOSIS:  LEFT CARPAL TUNNEL SYNDROME G56.02  POST-OPERATIVE DIAGNOSIS:  LEFT CARPAL TUNNEL SYNDROME G56.02  PROCEDURE:  Procedure(s): CARPAL TUNNEL RELEASE (Left)  SURGEON:  Surgeon(s) and Role:    * Leanora Cover, MD - Primary  PHYSICIAN ASSISTANT:   ASSISTANTS: none   ANESTHESIA:   Bier block with sedation  EBL:  2 mL   BLOOD ADMINISTERED:none  DRAINS: none   LOCAL MEDICATIONS USED:  MARCAINE     SPECIMEN:  No Specimen  DISPOSITION OF SPECIMEN:  N/A  COUNTS:  YES  TOURNIQUET:   Total Tourniquet Time Documented: Forearm (Left) - 22 minutes Total: Forearm (Left) - 22 minutes   DICTATION: .Note written in EPIC  PLAN OF CARE: Discharge to home after PACU  PATIENT DISPOSITION:  PACU - hemodynamically stable.

## 2017-09-10 NOTE — H&P (Signed)
  Margaret Webster is an 56 y.o. female.   Chief Complaint: left carpal tunnel syndrome HPI: 56 yo female with left carpal tunnel syndrome.  Positive nerve conduction studies.  She wishes to have a left carpal tunnel release.  Allergies:  Allergies  Allergen Reactions  . Sulfa Antibiotics Other (See Comments)    Hard to breathe  . Ibuprofen     Stomach upset  . Influenza Vaccines Hives  . Lipitor [Atorvastatin Calcium] Other (See Comments)    Muscle ache   . Naproxen Nausea And Vomiting    Past Medical History:  Diagnosis Date  . Arthritis    bil shoulders  . Bronchitis   . Chronic back pain   . Diabetes mellitus    stopped Mrtformin 1 mo ago, made her "sick"  . Hypertension   . Hypothyroidism   . Thyroid disease   . Trigger thumb of right hand     Past Surgical History:  Procedure Laterality Date  . BACK SURGERY  1993  . CARPAL TUNNEL RELEASE  08/2015  . CERVICAL SPINE SURGERY Right 11/2012  . OOPHORECTOMY    . TRIGGER FINGER RELEASE Right 08/06/2017   Procedure: RIGHT THUMB TRIGGER RELEASE;  Surgeon: Leanora Cover, MD;  Location: [facility];  Service: Orthopedics;  Laterality: Right;    Family History: Family History  Problem Relation Age of Onset  . Diabetes Mother   . Cystic fibrosis Sister   . Cancer Sister        ?  Marland Kitchen Heart disease Maternal Grandmother   . Clotting disorder Maternal Grandfather     Social History:   reports that she has been smoking cigarettes.  She has been smoking about 0.50 packs per day. she has never used smokeless tobacco. She reports that she uses drugs. Drug: Marijuana. She reports that she does not drink alcohol.  Medications: Medications Prior to Admission  Medication Sig Dispense Refill  . amLODipine (NORVASC) 5 MG tablet Take 5 mg daily by mouth.    Marland Kitchen HYDROcodone-acetaminophen (NORCO) 5-325 MG tablet 1-2 tabs po q6 hours prn pain 20 tablet 0  . levothyroxine (SYNTHROID, LEVOTHROID) 125 MCG tablet Take 125 mcg  by mouth daily.    . Omega-3 Fatty Acids (FISH OIL) 1000 MG CAPS Take by mouth.      No results found for this or any previous visit (from the past 48 hour(s)).  No results found.   A comprehensive review of systems was negative.  Height 5\' 6"  (1.676 m), weight 89.4 kg (197 lb).  General appearance: alert, cooperative and appears stated age Head: Normocephalic, without obvious abnormality, atraumatic Neck: supple, symmetrical, trachea midline Resp: clear to auscultation bilaterally Cardio: regular rate and rhythm GI: non-tender Extremities: Intact sensation and capillary refill all digits.  +epl/fpl/io.  No wounds.  Pulses: 2+ and symmetric Skin: Skin color, texture, turgor normal. No rashes or lesions Neurologic: Grossly normal Incision/Wound:none  Assessment/Plan Left carpal tunnel syndrome.  Non operative and operative treatment options were discussed with the patient and patient wishes to proceed with operative treatment. Risks, benefits, and alternatives of surgery were discussed and the patient agrees with the plan of care.   Meliana Canner R 09/10/2017, 10:38 AM

## 2017-09-10 NOTE — Anesthesia Postprocedure Evaluation (Signed)
Anesthesia Post Note  Patient: TIONDRA FANG  Procedure(s) Performed: CARPAL TUNNEL RELEASE (Left Wrist)     Patient location during evaluation: PACU Anesthesia Type: MAC and Bier Block Level of consciousness: awake and alert Pain management: pain level controlled Vital Signs Assessment: post-procedure vital signs reviewed and stable Respiratory status: spontaneous breathing and respiratory function stable Cardiovascular status: stable Postop Assessment: no apparent nausea or vomiting Anesthetic complications: no    Last Vitals:  Vitals:   09/10/17 1430 09/10/17 1440  BP: (!) 124/93 126/69  Pulse: 66 66  Resp: 18 16  Temp:    SpO2: 100% 100%    Last Pain:  Vitals:   09/10/17 1440  TempSrc:   PainSc: 0-No pain                 Jonte Wollam DANIEL

## 2017-09-10 NOTE — Anesthesia Preprocedure Evaluation (Signed)
Anesthesia Evaluation  Patient identified by MRN, date of birth, ID band Patient awake    Reviewed: Allergy & Precautions, NPO status , Patient's Chart, lab work & pertinent test results  Airway Mallampati: II  TM Distance: >3 FB Neck ROM: Full    Dental no notable dental hx. (+) Dental Advisory Given   Pulmonary Current Smoker,    Pulmonary exam normal breath sounds clear to auscultation       Cardiovascular hypertension, Normal cardiovascular exam Rhythm:Regular Rate:Normal     Neuro/Psych  Headaches, negative psych ROS   GI/Hepatic negative GI ROS, Neg liver ROS,   Endo/Other  diabetesHypothyroidism   Renal/GU negative Renal ROS     Musculoskeletal  (+) Arthritis ,   Abdominal   Peds  Hematology negative hematology ROS (+)   Anesthesia Other Findings   Reproductive/Obstetrics negative OB ROS                             Anesthesia Physical  Anesthesia Plan  ASA: II  Anesthesia Plan: Bier Block and MAC and Bier Block-LIDOCAINE ONLY   Post-op Pain Management:    Induction: Intravenous  PONV Risk Score and Plan: 1 and Ondansetron and Propofol infusion  Airway Management Planned:   Additional Equipment:   Intra-op Plan:   Post-operative Plan:   Informed Consent: I have reviewed the patients History and Physical, chart, labs and discussed the procedure including the risks, benefits and alternatives for the proposed anesthesia with the patient or authorized representative who has indicated his/her understanding and acceptance.   Dental advisory given  Plan Discussed with: CRNA and Anesthesiologist  Anesthesia Plan Comments:         Anesthesia Quick Evaluation

## 2017-09-10 NOTE — Anesthesia Procedure Notes (Signed)
Anesthesia Regional Block: Bier block (IV Regional)   Pre-Anesthetic Checklist: ,, timeout performed, Correct Patient, Correct Site, Correct Laterality, Correct Procedure,, site marked, surgical consent,, at surgeon's request Needles:  Injection technique: Single-shot  Needle Type: Other      Needle Gauge: 20     Additional Needles:   Procedures:,,,,, intact distal pulses, Esmarch exsanguination, single tourniquet utilized,  Narrative:   Performed by: [employer]

## 2017-09-10 NOTE — Anesthesia Procedure Notes (Signed)
Procedure Name: MAC Performed by: Terrance Mass, CRNA Pre-anesthesia Checklist: Patient identified, Emergency Drugs available, Suction available, Patient being monitored and Timeout performed Patient Re-evaluated:Patient Re-evaluated prior to induction Oxygen Delivery Method: Simple face mask

## 2017-09-10 NOTE — Op Note (Signed)
09/10/2017 [facility]                              OPERATIVE REPORT   PREOPERATIVE DIAGNOSIS:  Left carpal tunnel syndrome.  POSTOPERATIVE DIAGNOSIS:  Left carpal tunnel syndrome.  PROCEDURE:  Left carpal tunnel release.  SURGEON:  Leanora Cover, MD  ASSISTANT:  none.  ANESTHESIA:  Bier block and sedation.  IV FLUIDS:  Per anesthesia flow sheet.  ESTIMATED BLOOD LOSS:  Minimal.  COMPLICATIONS:  None.  SPECIMENS:  None.  TOURNIQUET TIME:    Total Tourniquet Time Documented: Forearm (Left) - 22 minutes Total: Forearm (Left) - 22 minutes   DISPOSITION:  Stable to PACU.  LOCATION: [facility]  INDICATIONS:  56 yo female with carpal tunnel syndrome left hand.  Positive nerve conduction studies.   She wishes to have a carpal tunnel release for management of her symptoms.  Risks, benefits and alternatives of surgery were discussed including the risk of blood loss; infection; damage to nerves, vessels, tendons, ligaments, bone; failure of surgery; need for additional surgery; complications with wound healing; continued pain; recurrence of carpal tunnel syndrome; and damage to motor branch. She voiced understanding of these risks and elected to proceed.   OPERATIVE COURSE:  After being identified preoperatively by myself, the patient and I agreed upon the procedure and site of procedure.  The surgical site was marked.  The risks, benefits, and alternatives of the surgery were reviewed and she wished to proceed.  Surgical consent had been signed.  She was given IV Ancef as preoperative antibiotic prophylaxis.  She was transferred to the operating room and placed on the operating room table in supine position with the Left upper extremity on an armboard.  Bier block and sedation was induced by Anesthesiology.  Left upper extremity was prepped and draped in normal sterile orthopaedic fashion.  A surgical pause was performed between the surgeons, anesthesia, and  operating room staff, and all were in agreement as to the patient, procedure, and site of procedure.  Tourniquet at the proximal aspect of the forearm had been inflated for the Bier block.   Incision was made over the transverse carpal ligament and carried into the subcutaneous tissues by spreading technique.  Bipolar electrocautery was used to obtain hemostasis.  The palmar fascia was sharply incised.  The transverse carpal ligament was identified and sharply incised.  It was incised distally first.  Care was taken to ensure complete decompression distally.  It was then incised proximally.  Scissors were used to split the distal aspect of the volar antebrachial fascia.  A finger was placed into the wound to ensure complete decompression, which was the case.  The nerve was examined.  It was flattened and hyperemic.  The motor branch was identified and was intact.  The wound was copiously irrigated with sterile saline.  It was then closed with 4-0 nylon in a horizontal mattress fashion.  It was injected with 0.25% plain Marcaine to aid in postoperative analgesia.  It was dressed with sterile Xeroform, 4x4s, an ABD, and wrapped with Kerlix and an Ace bandage.  Tourniquet was deflated at 22 minutes.  Fingertips were pink with brisk capillary refill after deflation of the tourniquet.  Operative drapes were broken down.  The patient was awoken from anesthesia safely.  She was transferred back to stretcher and taken to the PACU in stable condition.  I will see her back in the  office in 1 week for postoperative followup.  I will give her a prescription for percocet 5/325 1-2 tabs PO q6 hours prn pain, dispense #20.    Tennis Must, MD Electronically signed, 09/10/17

## 2017-09-10 NOTE — Transfer of Care (Signed)
Immediate Anesthesia Transfer of Care Note  Patient: Margaret Webster  Procedure(s) Performed: CARPAL TUNNEL RELEASE (Left Wrist)  Patient Location: PACU  Anesthesia Type:MAC and Bier block  Level of Consciousness: awake  Airway & Oxygen Therapy: Patient Spontanous Breathing and Patient connected to face mask oxygen  Post-op Assessment: Report given to RN and Post -op Vital signs reviewed and stable  Post vital signs: Reviewed and stable  Last Vitals:  Vitals:   09/10/17 1055 09/10/17 1423  BP: 127/73 124/73  Pulse: 61 64  Resp: 20 16  Temp: 36.9 C   SpO2: 97% 100%    Last Pain:  Vitals:   09/10/17 1055  TempSrc: Oral         Complications: No apparent anesthesia complications

## 2017-09-11 ENCOUNTER — Encounter (HOSPITAL_BASED_OUTPATIENT_CLINIC_OR_DEPARTMENT_OTHER): Payer: Self-pay | Admitting: Orthopedic Surgery

## 2017-10-01 ENCOUNTER — Ambulatory Visit
Admission: RE | Admit: 2017-10-01 | Discharge: 2017-10-01 | Disposition: A | Discharge status: Home / Self Care | Payer: Medicare Other | Source: Ambulatory Visit | Attending: Internal Medicine | Admitting: Internal Medicine

## 2017-10-01 ENCOUNTER — Other Ambulatory Visit: Payer: Self-pay | Admitting: Internal Medicine

## 2017-10-01 DIAGNOSIS — E039 Hypothyroidism, unspecified: Secondary | ICD-10-CM

## 2017-10-01 DIAGNOSIS — Z780 Asymptomatic menopausal state: Secondary | ICD-10-CM

## 2017-10-01 DIAGNOSIS — Z1231 Encounter for screening mammogram for malignant neoplasm of breast: Secondary | ICD-10-CM

## 2017-12-22 LAB — HM DIABETES EYE EXAM

## 2018-03-03 ENCOUNTER — Ambulatory Visit: Payer: Medicare Other | Admitting: Obstetrics

## 2018-03-10 ENCOUNTER — Ambulatory Visit: Payer: Medicare Other | Admitting: Obstetrics

## 2018-04-15 ENCOUNTER — Emergency Department (HOSPITAL_COMMUNITY)
Admission: EM | Admit: 2018-04-15 | Discharge: 2018-04-15 | Disposition: A | Discharge status: Home / Self Care | Payer: Medicare Other | Attending: Emergency Medicine | Admitting: Emergency Medicine

## 2018-04-15 ENCOUNTER — Other Ambulatory Visit: Payer: Self-pay

## 2018-04-15 DIAGNOSIS — H66001 Acute suppurative otitis media without spontaneous rupture of ear drum, right ear: Secondary | ICD-10-CM | POA: Diagnosis not present

## 2018-04-15 DIAGNOSIS — Z79899 Other long term (current) drug therapy: Secondary | ICD-10-CM | POA: Diagnosis not present

## 2018-04-15 DIAGNOSIS — F17210 Nicotine dependence, cigarettes, uncomplicated: Secondary | ICD-10-CM | POA: Insufficient documentation

## 2018-04-15 DIAGNOSIS — H9201 Otalgia, right ear: Secondary | ICD-10-CM | POA: Diagnosis present

## 2018-04-15 DIAGNOSIS — E039 Hypothyroidism, unspecified: Secondary | ICD-10-CM | POA: Insufficient documentation

## 2018-04-15 DIAGNOSIS — E119 Type 2 diabetes mellitus without complications: Secondary | ICD-10-CM | POA: Insufficient documentation

## 2018-04-15 DIAGNOSIS — I10 Essential (primary) hypertension: Secondary | ICD-10-CM | POA: Diagnosis not present

## 2018-04-15 LAB — GROUP A STREP BY PCR: Group A Strep by PCR: NOT DETECTED

## 2018-04-15 MED ORDER — AMOXICILLIN-POT CLAVULANATE 875-125 MG PO TABS: 1.0000 | ORAL_TABLET | Freq: Two times a day (BID) | ORAL | 0 refills | Status: DC

## 2018-04-15 MED ORDER — CETIRIZINE HCL 10 MG PO TABS: 10.0000 mg | ORAL_TABLET | Freq: Every day | ORAL | 0 refills | Status: DC

## 2018-04-15 NOTE — ED Provider Notes (Signed)
[facility] EMERGENCY DEPARTMENT Provider Note   CSN: [id number] Arrival date & time: 04/15/18  1925     History   Chief Complaint Chief Complaint  Patient presents with  . Sore Throat    HPI Margaret Webster is a 57 y.o. female.  HPI   Margaret Webster is a 57 year old female with a history of type 2 diabetes, hypertension, hypothyroidism, chronic back pain, headaches who presents to the emergency department for evaluation of right ear pain and sore throat.  Patient states that her symptoms have been ongoing for a month now.  States that she was seen at her PCP office who prescribed her amoxicillin and her symptoms were temporarily improved.  She reports that she has aching right ear pain, denies loss of hearing, otorrhea or tinnitus.  She also reports burning sensation in the back of the throat which is 10/10 in severity and worsened with swallowing.  Denies any alleviating factors.  She has not taken any over-the-counter medications for her symptoms.  She also reports some intermittent nasal congestion, sneezing and eye itching.  States that she has had intermittent headaches for 6 months now, but denies headache currently.  States that she has tender lymphadenopathy.  She denies fevers, chills, headache, sinus pain, sinus pressure, cough, dysphagia, shortness of breath, chest pain, abdominal pain, nausea/vomiting.  Is able to swallow despite pain.  Past Medical History:  Diagnosis Date  . Arthritis    bil shoulders  . Bronchitis   . Chronic back pain   . Diabetes mellitus    stopped Mrtformin 1 mo ago, made her "sick"  . Hypertension   . Hypothyroidism   . Thyroid disease   . Trigger thumb of right hand     Patient Active Problem List   Diagnosis Date Noted  . Chest pain 01/10/2015  . Diabetes mellitus 01/10/2015  . Hypertension 01/10/2015  . Thyroid disease 01/10/2015  . Headache 01/10/2015  . Abdominal pain 01/10/2015  . Vaginitis 04/07/2014  . Pain  04/07/2014  . Vaginal irritation 04/07/2014  . Boil of buttock 04/06/2014  . BV (bacterial vaginosis) 04/06/2014  . Unspecified symptom associated with female genital organs 11/16/2013  . Candidiasis of vulva and vagina 11/16/2013  . Vaginitis and vulvovaginitis, unspecified 06/07/2013  . Pain aggravated by activities of daily living 06/07/2013  . Nonspecific abnormal finding in stool contents 12/02/2011  . Diabetes mellitus without mention of complication 67/09/4579  . Chronic back pain     Past Surgical History:  Procedure Laterality Date  . BACK SURGERY  1993  . CARPAL TUNNEL RELEASE  08/2015  . CARPAL TUNNEL RELEASE Left 09/10/2017   Procedure: CARPAL TUNNEL RELEASE;  Surgeon: Leanora Cover, MD;  Location: [facility];  Service: Orthopedics;  Laterality: Left;  . CERVICAL SPINE SURGERY Right 11/2012  . OOPHORECTOMY    . TRIGGER FINGER RELEASE Right 08/06/2017   Procedure: RIGHT THUMB TRIGGER RELEASE;  Surgeon: Leanora Cover, MD;  Location: [facility];  Service: Orthopedics;  Laterality: Right;     OB History    Gravida  1   Para  0   Term  0   Preterm  0   AB  0   Living  1     SAB  0   TAB  0   Ectopic  0   Multiple  0   Live Births  1            Home Medications  Prior to Admission medications   Medication Sig Start Date End Date Taking? Authorizing Provider  amLODipine (NORVASC) 5 MG tablet Take 5 mg daily by mouth.    [provider]  levothyroxine (SYNTHROID, LEVOTHROID) 125 MCG tablet Take 125 mcg by mouth daily. 05/23/14   [provider]  Omega-3 Fatty Acids (FISH OIL) 1000 MG CAPS Take by mouth.    [provider]  oxyCODONE-acetaminophen (PERCOCET) 5-325 MG tablet 1-2 tabs po q6 hours prn pain 09/10/17   Leanora Cover, MD    Family History Family History  Problem Relation Age of Onset  . Diabetes Mother   . Cystic fibrosis Sister   . Cancer Sister        ?  Marland Kitchen Heart disease  Maternal Grandmother   . Clotting disorder Maternal Grandfather     Social History Social History   Tobacco Use  . Smoking status: Current Every Day Smoker    Packs/day: 0.50    Types: Cigarettes  . Smokeless tobacco: Never Used  Substance Use Topics  . Alcohol use: No  . Drug use: Yes    Types: Marijuana    Comment: every now and then     Allergies   Sulfa antibiotics; Ibuprofen; Influenza vaccines; Lipitor [atorvastatin calcium]; and Naproxen   Review of Systems Review of Systems  Constitutional: Negative for chills and fever.  HENT: Positive for congestion, ear pain (right), postnasal drip, rhinorrhea, sneezing and sore throat. Negative for facial swelling, trouble swallowing and voice change.   Eyes: Negative for visual disturbance.  Respiratory: Negative for cough and shortness of breath.   Cardiovascular: Negative for chest pain.  Gastrointestinal: Negative for abdominal pain, nausea and vomiting.  Musculoskeletal: Negative for neck stiffness.  Skin: Negative for rash.  Neurological: Negative for headaches (Intermittent frontal headache for months, denies this currently).     Physical Exam Updated Vital Signs BP (!) 130/92 (BP Location: Right Arm)   Pulse 67   Temp 98.9 F (37.2 C) (Oral)   Resp 18   Ht 5\' 7"  (1.702 m)   Wt 89.4 kg (197 lb)   SpO2 97%   BMI 30.85 kg/m   Physical Exam  Constitutional: She is oriented to person, place, and time. She appears well-developed and well-nourished. No distress.  No acute distress, nontoxic-appearing.  HENT:  Head: Normocephalic and atraumatic.  Bilateral ear canals normal.  Right TM injected and bulging, no rupture.  Left TM with good cone of light.  No tenderness over the mastoid process bilaterally.  No tenderness over maxillary or frontal sinuses.  Mucous membranes moist.  Moderate postnasal drip present.  Posterior oropharynx without erythema, tonsillar edema or exudate.  Uvula midline.  Airway patent and  patient able to handle oral secretions.  Nares boggy.  Eyes: Pupils are equal, round, and reactive to light. Conjunctivae are normal. Right eye exhibits no discharge. Left eye exhibits no discharge.  Neck: Normal range of motion. Neck supple.  Mild right anterior cervical chain adenopathy.  Cardiovascular: Normal rate and regular rhythm.  Pulmonary/Chest: Effort normal and breath sounds normal. No stridor. No respiratory distress. She has no wheezes. She has no rales.  Abdominal: Soft. There is no tenderness.  Neurological: She is alert and oriented to person, place, and time. Coordination normal.  Skin: Skin is warm and dry. She is not diaphoretic.  Psychiatric: She has a normal mood and affect. Her behavior is normal.  Nursing note and vitals reviewed.  ED Treatments / Results  Labs (all labs  ordered are listed, but only abnormal results are displayed) Labs Reviewed  GROUP A STREP BY PCR    EKG None  Radiology No results found.  Procedures Procedures (including critical care time)  Medications Ordered in ED Medications - No data to display   Initial Impression / Assessment and Plan / ED Course  I have reviewed the triage vital signs and the nursing notes.  Pertinent labs & imaging results that were available during my care of the patient were reviewed by me and considered in my medical decision making (see chart for details).     Patient presents with otalgia and exam consistent with acute otitis media. No concern for acute mastoiditis or meningitis.  Will discharge home with Augmentin. In terms of her sore throat, this is likely related to postnasal drip.  Rapid strep negative.  Posterior oropharynx without erythema or tonsillar exudate.  Uvula midline, no concern for RPA or PTA.  Have counseled patient to start zyrtec daily.  Advised patient to follow-up with her primary care doctor in 72 hours if her ear pain is not improving.  I have also discussed reasons to return  immediately to the ER.  Patient expresses understanding and agrees with plan.  Final Clinical Impressions(s) / ED Diagnoses   Final diagnoses:  Acute suppurative otitis media of right ear without spontaneous rupture of tympanic membrane, recurrence not specified    ED Discharge Orders        Ordered    amoxicillin-clavulanate (AUGMENTIN) 875-125 MG tablet  Every 12 hours     04/15/18 2225    cetirizine (ZYRTEC ALLERGY) 10 MG tablet  Daily     04/15/18 2225       Bernarda Caffey 04/16/18 0127    Quintella Reichert, MD 04/16/18 [phone]

## 2018-04-15 NOTE — ED Triage Notes (Signed)
Patient c/o sore throat, right ear pain and HA. Patient saw her provider and took a round of abx with no relief. Patient states that this has been ongoing since May 18th.

## 2018-04-15 NOTE — Discharge Instructions (Signed)
You have an ear infection.  Please take antibiotic twice daily for the next week.  Follow-up with your regular doctor or if your symptoms are not improving in a week.  Return to the ER if you have any new or concerning symptoms like fever greater than 100.4 degrees, difficulty moving her neck, trouble swallowing or breathing.

## 2018-04-15 NOTE — ED Notes (Signed)
Pt verbalized understanding discharge instructions and denies any further needs or questions at this time. VS stable, ambulatory and steady gait.   

## 2018-05-06 ENCOUNTER — Emergency Department (HOSPITAL_COMMUNITY)
Admission: EM | Admit: 2018-05-06 | Discharge: 2018-05-06 | Disposition: A | Discharge status: Home / Self Care | Payer: Medicare Other | Attending: Emergency Medicine | Admitting: Emergency Medicine

## 2018-05-06 ENCOUNTER — Encounter (HOSPITAL_COMMUNITY): Payer: Self-pay | Admitting: Emergency Medicine

## 2018-05-06 DIAGNOSIS — R112 Nausea with vomiting, unspecified: Secondary | ICD-10-CM | POA: Diagnosis present

## 2018-05-06 DIAGNOSIS — E119 Type 2 diabetes mellitus without complications: Secondary | ICD-10-CM | POA: Insufficient documentation

## 2018-05-06 DIAGNOSIS — I10 Essential (primary) hypertension: Secondary | ICD-10-CM | POA: Diagnosis not present

## 2018-05-06 DIAGNOSIS — G43A0 Cyclical vomiting, not intractable: Secondary | ICD-10-CM | POA: Insufficient documentation

## 2018-05-06 DIAGNOSIS — R1115 Cyclical vomiting syndrome unrelated to migraine: Secondary | ICD-10-CM

## 2018-05-06 DIAGNOSIS — F17210 Nicotine dependence, cigarettes, uncomplicated: Secondary | ICD-10-CM | POA: Diagnosis not present

## 2018-05-06 DIAGNOSIS — E039 Hypothyroidism, unspecified: Secondary | ICD-10-CM | POA: Diagnosis not present

## 2018-05-06 LAB — URINALYSIS, ROUTINE W REFLEX MICROSCOPIC
Glucose, UA: NEGATIVE mg/dL
Hgb urine dipstick: NEGATIVE
Ketones, ur: 5 mg/dL — AB
Leukocytes, UA: NEGATIVE
Nitrite: NEGATIVE
Protein, ur: >=300 mg/dL — AB
Specific Gravity, Urine: 1.025 (ref 1.005–1.030)
Squamous Epithelial / LPF: >50 — ABNORMAL HIGH (ref 0–5)
pH: 5 (ref 5.0–8.0)

## 2018-05-06 LAB — COMPREHENSIVE METABOLIC PANEL
ALT: 14 U/L (ref 0–44)
AST: 18 U/L (ref 15–41)
Albumin: 3.7 g/dL (ref 3.5–5.0)
Alkaline Phosphatase: 58 U/L (ref 38–126)
Anion gap: 11 (ref 5–15)
BUN: 6 mg/dL (ref 6–20)
CO2: 24 mmol/L (ref 22–32)
Calcium: 9.2 mg/dL (ref 8.9–10.3)
Chloride: 104 mmol/L (ref 98–111)
Creatinine, Ser: 0.98 mg/dL (ref 0.44–1.00)
GFR calc Af Amer: >60 mL/min (ref >60)
GFR calc non Af Amer: >60 mL/min (ref >60)
Glucose, Bld: 122 mg/dL — ABNORMAL HIGH (ref 70–99)
Potassium: 3.5 mmol/L (ref 3.5–5.1)
Sodium: 139 mmol/L (ref 135–145)
Total Bilirubin: 1 mg/dL (ref 0.3–1.2)
Total Protein: 7 g/dL (ref 6.5–8.1)

## 2018-05-06 LAB — CBC
HCT: 45.2 % (ref 36.0–46.0)
Hemoglobin: 15.3 g/dL — ABNORMAL HIGH (ref 12.0–15.0)
MCH: 31.2 pg (ref 26.0–34.0)
MCHC: 33.8 g/dL (ref 30.0–36.0)
MCV: 92.2 fL (ref 78.0–100.0)
Platelets: 310 10*3/uL (ref 150–400)
RBC: 4.9 MIL/uL (ref 3.87–5.11)
RDW: 12.3 % (ref 11.5–15.5)
WBC: 7.1 10*3/uL (ref 4.0–10.5)

## 2018-05-06 LAB — POC OCCULT BLOOD, ED: Fecal Occult Bld: NEGATIVE

## 2018-05-06 LAB — LIPASE, BLOOD: Lipase: 54 U/L — ABNORMAL HIGH (ref 11–51)

## 2018-05-06 MED ORDER — ONDANSETRON 4 MG PO TBDP
4.0000 mg | ORAL_TABLET | Freq: Once | ORAL | Status: AC | PRN
  Administered 2018-05-06: 4 mg via ORAL
  Filled 2018-05-06: qty 1

## 2018-05-06 MED ORDER — METOCLOPRAMIDE HCL 10 MG PO TABS: 10.0000 mg | ORAL_TABLET | Freq: Three times a day (TID) | ORAL | 0 refills | Status: DC | PRN

## 2018-05-06 MED ORDER — METOCLOPRAMIDE HCL 5 MG/ML IJ SOLN
10.0000 mg | Freq: Once | INTRAMUSCULAR | Status: AC
  Administered 2018-05-06: 10 mg via INTRAVENOUS
  Filled 2018-05-06: qty 2

## 2018-05-06 MED ORDER — SODIUM CHLORIDE 0.9 % IV BOLUS (SEPSIS)
1000.0000 mL | Freq: Once | INTRAVENOUS | Status: AC
  Administered 2018-05-06: 1000 mL via INTRAVENOUS

## 2018-05-06 MED ORDER — DIPHENHYDRAMINE HCL 50 MG/ML IJ SOLN
25.0000 mg | Freq: Once | INTRAMUSCULAR | Status: AC
  Administered 2018-05-06: 25 mg via INTRAVENOUS
  Filled 2018-05-06: qty 1

## 2018-05-06 NOTE — ED Triage Notes (Signed)
Pt c/o abdominal pain and n/v for two days. Pt stated she is having trouble holding anything down. Was on ampicillin and caused constipation. Took laxative with relief

## 2018-05-06 NOTE — ED Provider Notes (Signed)
[facility] EMERGENCY DEPARTMENT Provider Note   CSN: [id number] Arrival date & time: 05/06/18  0139     History   Chief Complaint Chief Complaint  Patient presents with  . Nausea  . Emesis  . Abdominal Pain    HPI Margaret Webster is a 57 y.o. female.  The history is provided by the patient.  Emesis   This is a new problem. The current episode started more than 2 days ago. The problem occurs 2 to 4 times per day. The problem has been gradually worsening. The emesis has an appearance of stomach contents. There has been no fever. Associated symptoms include abdominal pain. Pertinent negatives include no cough, no diarrhea and no fever.  Abdominal Pain   This is a new problem. The current episode started more than 2 days ago. The problem occurs constantly. The problem has been gradually worsening. The pain is located in the generalized abdominal region. Associated symptoms include melena and vomiting. Pertinent negatives include fever, diarrhea and dysuria. The symptoms are aggravated by palpation and certain positions. Nothing relieves the symptoms.  Patient reports onset of generalized abdominal pain, nausea and vomiting for the past 2 to 3 days.  She reports she is only vomiting up clear fluids. Denies diarrhea, but does report black stool. She recalls having these symptoms previously. No ETOH abuse No NSAID use Past Medical History:  Diagnosis Date  . Arthritis    bil shoulders  . Bronchitis   . Chronic back pain   . Diabetes mellitus    stopped Mrtformin 1 mo ago, made her "sick"  . Hypertension   . Hypothyroidism   . Thyroid disease   . Trigger thumb of right hand     Patient Active Problem List   Diagnosis Date Noted  . Chest pain 01/10/2015  . Diabetes mellitus 01/10/2015  . Hypertension 01/10/2015  . Thyroid disease 01/10/2015  . Headache 01/10/2015  . Abdominal pain 01/10/2015  . Vaginitis 04/07/2014  . Pain 04/07/2014  . Vaginal  irritation 04/07/2014  . Boil of buttock 04/06/2014  . BV (bacterial vaginosis) 04/06/2014  . Unspecified symptom associated with female genital organs 11/16/2013  . Candidiasis of vulva and vagina 11/16/2013  . Vaginitis and vulvovaginitis, unspecified 06/07/2013  . Pain aggravated by activities of daily living 06/07/2013  . Nonspecific abnormal finding in stool contents 12/02/2011  . Diabetes mellitus without mention of complication 76/73/4193  . Chronic back pain     Past Surgical History:  Procedure Laterality Date  . BACK SURGERY  1993  . CARPAL TUNNEL RELEASE  08/2015  . CARPAL TUNNEL RELEASE Left 09/10/2017   Procedure: CARPAL TUNNEL RELEASE;  Surgeon: Leanora Cover, MD;  Location: [facility];  Service: Orthopedics;  Laterality: Left;  . CERVICAL SPINE SURGERY Right 11/2012  . OOPHORECTOMY    . TRIGGER FINGER RELEASE Right 08/06/2017   Procedure: RIGHT THUMB TRIGGER RELEASE;  Surgeon: Leanora Cover, MD;  Location: [facility];  Service: Orthopedics;  Laterality: Right;     OB History    Gravida  1   Para  0   Term  0   Preterm  0   AB  0   Living  1     SAB  0   TAB  0   Ectopic  0   Multiple  0   Live Births  1            Home Medications    Prior to  Admission medications   Medication Sig Start Date End Date Taking? Authorizing Provider  cetirizine (ZYRTEC ALLERGY) 10 MG tablet Take 1 tablet (10 mg total) by mouth daily. Patient not taking: Reported on 05/06/2018 04/15/18   Glyn Ade, PA-C  metoCLOPramide (REGLAN) 10 MG tablet Take 1 tablet (10 mg total) by mouth every 8 (eight) hours as needed for nausea (nausea/headache). 05/06/18   Ripley Fraise, MD    Family History Family History  Problem Relation Age of Onset  . Diabetes Mother   . Cystic fibrosis Sister   . Cancer Sister        ?  Marland Kitchen Heart disease Maternal Grandmother   . Clotting disorder Maternal Grandfather     Social History Social History    Tobacco Use  . Smoking status: Current Every Day Smoker    Packs/day: 0.50    Types: Cigarettes  . Smokeless tobacco: Never Used  Substance Use Topics  . Alcohol use: No  . Drug use: Yes    Types: Marijuana    Comment: every now and then     Allergies   Sulfa antibiotics; Ibuprofen; Influenza vaccines; Lipitor [atorvastatin calcium]; and Naproxen   Review of Systems Review of Systems  Constitutional: Negative for fever.  Respiratory: Negative for cough.   Cardiovascular: Negative for chest pain.  Gastrointestinal: Positive for abdominal pain, blood in stool, melena and vomiting. Negative for diarrhea.  Genitourinary: Negative for dysuria.  All other systems reviewed and are negative.    Physical Exam Updated Vital Signs BP (!) 178/90   Pulse 63   Temp 98 F (36.7 C) (Oral)   Resp 18   Ht 1.702 m (5\' 7" )   Wt 89.4 kg (197 lb)   SpO2 100%   BMI 30.85 kg/m   Physical Exam  CONSTITUTIONAL: Well developed/well nourished, uncomfortable appearing HEAD: Normocephalic/atraumatic EYES: EOMI/PERRL, no icterus ENMT: Mucous membranes moist NECK: supple no meningeal signs SPINE/BACK:entire spine nontender CV: S1/S2 noted, no murmurs/rubs/gallops noted LUNGS: Lungs are clear to auscultation bilaterally, no apparent distress ABDOMEN: soft, nontender, no rebound or guarding, bowel sounds noted throughout abdomen GU:no cva tenderness Rectal-no blood or melena, female nurse chaperone present for exam NEURO: Pt is awake/alert/appropriate, moves all extremitiesx4.  No facial droop.   EXTREMITIES: pulses normal/equal, full ROM SKIN: warm, color normal PSYCH: no abnormalities of mood noted, alert and oriented to situation  ED Treatments / Results  Labs (all labs ordered are listed, but only abnormal results are displayed) Labs Reviewed  LIPASE, BLOOD - Abnormal; Notable for the following components:      Result Value   Lipase 54 (*)    All other components within normal  limits  COMPREHENSIVE METABOLIC PANEL - Abnormal; Notable for the following components:   Glucose, Bld 122 (*)    All other components within normal limits  CBC - Abnormal; Notable for the following components:   Hemoglobin 15.3 (*)    All other components within normal limits  URINALYSIS, ROUTINE W REFLEX MICROSCOPIC - Abnormal; Notable for the following components:   Color, Urine AMBER (*)    APPearance CLOUDY (*)    Bilirubin Urine MODERATE (*)    Ketones, ur 5 (*)    Protein, ur >=300 (*)    Bacteria, UA MANY (*)    Squamous Epithelial / LPF >50 (*)    All other components within normal limits  I-STAT BETA HCG BLOOD, ED ([facility], [facility], [facility] ONLY)  POC OCCULT BLOOD, ED    EKG EKG Interpretation  Date/Time:  Thursday May 06 2018 05:51:29 EDT Ventricular Rate:  59 PR Interval:    QRS Duration: 82 QT Interval:  438 QTC Calculation: 434 R Axis:   71 Text Interpretation:  Sinus rhythm Abnormal R-wave progression, early transition Borderline T abnormalities, anterior leads No significant change since last tracing Confirmed by Ripley Fraise [phone]) on 05/06/2018 6:09:50 AM   Radiology No results found.  Procedures Procedures   Medications Ordered in ED Medications  ondansetron (ZOFRAN-ODT) disintegrating tablet 4 mg (4 mg Oral Given 05/06/18 0202)  sodium chloride 0.9 % bolus 1,000 mL (1,000 mLs Intravenous New Bag/Given 05/06/18 4235)  metoCLOPramide (REGLAN) injection 10 mg (10 mg Intravenous Given 05/06/18 0622)  diphenhydrAMINE (BENADRYL) injection 25 mg (25 mg Intravenous Given 05/06/18 0622)     Initial Impression / Assessment and Plan / ED Course  I have reviewed the triage vital signs and the nursing notes.  Pertinent labs & imaging results that were available during my care of the patient were reviewed by me and considered in my medical decision making (see chart for details).     Presented for nausea/vomiting/abdominal pain.  Patient is now improved.  She is taking  p.o. fluids.  She reports she has had this previously, this could be possible gastroparesis.  We will start her on Reglan.  She had good response to Reglan in emergency department.  Urine likely contaminated.  No other significant findings except for mildly elevated lipase, but she is low risk for pancreatitis  She is taking water without difficulty.  No worsening pain.  She is appropriate for discharge.  Will refer to GI, and start short course of Reglan.  I do not feel the need for emergent CT imaging at this time as she is now improved.  Final Clinical Impressions(s) / ED Diagnoses   Final diagnoses:  Non-intractable cyclical vomiting with nausea    ED Discharge Orders        Ordered    metoCLOPramide (REGLAN) 10 MG tablet  Every 8 hours PRN     05/06/18 0756       Ripley Fraise, MD 05/06/18 [phone]

## 2018-05-10 ENCOUNTER — Other Ambulatory Visit: Payer: Self-pay

## 2018-05-10 ENCOUNTER — Encounter (HOSPITAL_COMMUNITY): Payer: Self-pay | Admitting: Emergency Medicine

## 2018-05-10 ENCOUNTER — Emergency Department (HOSPITAL_COMMUNITY)
Admission: EM | Admit: 2018-05-10 | Discharge: 2018-05-10 | Disposition: A | Discharge status: Home / Self Care | Payer: Medicare Other | Attending: Emergency Medicine | Admitting: Emergency Medicine

## 2018-05-10 DIAGNOSIS — Y929 Unspecified place or not applicable: Secondary | ICD-10-CM | POA: Diagnosis not present

## 2018-05-10 DIAGNOSIS — F17210 Nicotine dependence, cigarettes, uncomplicated: Secondary | ICD-10-CM | POA: Diagnosis not present

## 2018-05-10 DIAGNOSIS — I10 Essential (primary) hypertension: Secondary | ICD-10-CM | POA: Insufficient documentation

## 2018-05-10 DIAGNOSIS — Z23 Encounter for immunization: Secondary | ICD-10-CM | POA: Insufficient documentation

## 2018-05-10 DIAGNOSIS — W450XXA Nail entering through skin, initial encounter: Secondary | ICD-10-CM | POA: Insufficient documentation

## 2018-05-10 DIAGNOSIS — E119 Type 2 diabetes mellitus without complications: Secondary | ICD-10-CM | POA: Insufficient documentation

## 2018-05-10 DIAGNOSIS — S91332A Puncture wound without foreign body, left foot, initial encounter: Secondary | ICD-10-CM | POA: Diagnosis not present

## 2018-05-10 DIAGNOSIS — Y9301 Activity, walking, marching and hiking: Secondary | ICD-10-CM | POA: Insufficient documentation

## 2018-05-10 DIAGNOSIS — Y999 Unspecified external cause status: Secondary | ICD-10-CM | POA: Insufficient documentation

## 2018-05-10 MED ORDER — LEVOFLOXACIN 500 MG PO TABS: 500.0000 mg | ORAL_TABLET | Freq: Every day | ORAL | 0 refills | Status: AC

## 2018-05-10 MED ORDER — TETANUS-DIPHTH-ACELL PERTUSSIS 5-2.5-18.5 LF-MCG/0.5 IM SUSP
0.5000 mL | Freq: Once | INTRAMUSCULAR | Status: AC
  Administered 2018-05-10: 0.5 mL via INTRAMUSCULAR
  Filled 2018-05-10: qty 0.5

## 2018-05-10 NOTE — ED Provider Notes (Signed)
[facility] EMERGENCY DEPARTMENT Provider Note  CSN: [id number] Arrival date & time: 05/10/18  1211  History   Chief Complaint No chief complaint on file.   HPI Margaret Webster is a 57 y.o. female with a medical history of Type 2 DM, HTN and hypothyroidism who presented to the ED after stepping on rusty nail. Patient states she stepped on it with her left foot while barefooted and removed the nail herself. This was approx. 1 hour before coming to the ED. She has no physical complaints other than tenderness at the puncture site.  Past Medical History:  Diagnosis Date  . Arthritis    bil shoulders  . Bronchitis   . Chronic back pain   . Diabetes mellitus    stopped Mrtformin 1 mo ago, made her "sick"  . Hypertension   . Hypothyroidism   . Thyroid disease   . Trigger thumb of right hand     Patient Active Problem List   Diagnosis Date Noted  . Chest pain 01/10/2015  . Diabetes mellitus 01/10/2015  . Hypertension 01/10/2015  . Thyroid disease 01/10/2015  . Headache 01/10/2015  . Abdominal pain 01/10/2015  . Vaginitis 04/07/2014  . Pain 04/07/2014  . Vaginal irritation 04/07/2014  . Boil of buttock 04/06/2014  . BV (bacterial vaginosis) 04/06/2014  . Unspecified symptom associated with female genital organs 11/16/2013  . Candidiasis of vulva and vagina 11/16/2013  . Vaginitis and vulvovaginitis, unspecified 06/07/2013  . Pain aggravated by activities of daily living 06/07/2013  . Nonspecific abnormal finding in stool contents 12/02/2011  . Diabetes mellitus without mention of complication 62/13/0865  . Chronic back pain     Past Surgical History:  Procedure Laterality Date  . BACK SURGERY  1993  . CARPAL TUNNEL RELEASE  08/2015  . CARPAL TUNNEL RELEASE Left 09/10/2017   Procedure: CARPAL TUNNEL RELEASE;  Surgeon: Leanora Cover, MD;  Location: [facility];  Service: Orthopedics;  Laterality: Left;  . CERVICAL SPINE SURGERY Right 11/2012    . OOPHORECTOMY    . TRIGGER FINGER RELEASE Right 08/06/2017   Procedure: RIGHT THUMB TRIGGER RELEASE;  Surgeon: Leanora Cover, MD;  Location: [facility];  Service: Orthopedics;  Laterality: Right;     OB History    Gravida  1   Para  0   Term  0   Preterm  0   AB  0   Living  1     SAB  0   TAB  0   Ectopic  0   Multiple  0   Live Births  1            Home Medications    Prior to Admission medications   Medication Sig Start Date End Date Taking? Authorizing Provider  cetirizine (ZYRTEC ALLERGY) 10 MG tablet Take 1 tablet (10 mg total) by mouth daily. Patient not taking: Reported on 05/06/2018 04/15/18   Glyn Ade, PA-C  levofloxacin (LEVAQUIN) 500 MG tablet Take 1 tablet (500 mg total) by mouth daily for 5 days. 05/10/18 05/15/18  Daniel Johndrow, Alvie Heidelberg I, PA-C  metoCLOPramide (REGLAN) 10 MG tablet Take 1 tablet (10 mg total) by mouth every 8 (eight) hours as needed for nausea (nausea/headache). 05/06/18   Ripley Fraise, MD    Family History Family History  Problem Relation Age of Onset  . Diabetes Mother   . Cystic fibrosis Sister   . Cancer Sister        ?  Marland Kitchen  Heart disease Maternal Grandmother   . Clotting disorder Maternal Grandfather     Social History Social History   Tobacco Use  . Smoking status: Current Every Day Smoker    Packs/day: 0.50    Types: Cigarettes  . Smokeless tobacco: Never Used  Substance Use Topics  . Alcohol use: No  . Drug use: Yes    Types: Marijuana    Comment: every now and then     Allergies   Sulfa antibiotics; Ibuprofen; Influenza vaccines; Lipitor [atorvastatin calcium]; and Naproxen   Review of Systems Review of Systems  Constitutional: Negative for activity change, appetite change, chills and fever.  Musculoskeletal: Negative for arthralgias, gait problem and joint swelling.  Skin: Positive for wound. Negative for color change, pallor and rash.  Neurological: Negative.    Hematological: Negative.      Physical Exam Updated Vital Signs BP 133/83 (BP Location: Right Arm)   Pulse 63   Temp 98.2 F (36.8 C) (Oral)   Resp 16   SpO2 95%   Physical Exam  Cardiovascular:  Pulses:      Dorsalis pedis pulses are 2+ on the right side, and 2+ on the left side.       Posterior tibial pulses are 2+ on the right side, and 2+ on the left side.  Musculoskeletal:       Right foot: There is normal range of motion and no deformity.       Left foot: There is normal range of motion and no deformity.       Feet:  Puncture wound seen on left posterior heel that is tender to palpation. No surrounding erythema or edema. Full ROM of lower extremities. Able to bear weight and ambulate without issue.  Skin: Skin is warm and intact. No rash noted. No erythema.     ED Treatments / Results  Labs (all labs ordered are listed, but only abnormal results are displayed) Labs Reviewed - No data to display  EKG None  Radiology No results found.  Procedures Procedures (including critical care time)  Medications Ordered in ED Medications  Tdap (BOOSTRIX) injection 0.5 mL (0.5 mLs Intramuscular Given 05/10/18 1541)     Initial Impression / Assessment and Plan / ED Course  Triage vital signs and the nursing notes have been reviewed.  Pertinent labs & imaging results that were available during care of the patient were reviewed and considered in medical decision making (see chart for details).   Patient presents after a puncture wound to the plantar aspect of her left foot. She has no other complaints today, but is concerned given that she has diabetes. Physical exam unremarkable except for tenderness at puncture site. Education provided on appropriate foot checks/wound healing and antibiotics needed for this injury. Advised to follow-up with PCP regularly for diabetes management.   Final Clinical Impressions(s) / ED Diagnoses  1. Puncture Wound. Tdap given in the ED.  Levaquin 500mg  QD x5 days prescribed to ensure appropriate Pseudomonas coverage.  Dispo: Home. After thorough clinical evaluation, this patient is determined to be medically stable and can be safely discharged with the previously mentioned treatment and/or outpatient follow-up/referral(s). At this time, there are no other apparent medical conditions that require further screening, evaluation or treatment.  Final diagnoses:  Puncture wound of plantar aspect of left foot, initial encounter    ED Discharge Orders        Ordered    levofloxacin (LEVAQUIN) 500 MG tablet  Daily     05/10/18  [address] 05/10/18 [place], DO 05/11/18 [phone]

## 2018-05-10 NOTE — Discharge Instructions (Addendum)
Your received your tetanus booster today.  Take the full course of this antibiotic I prescribed. Check the bottom of your foot daily to check for any changes.  Follow-up with a medical provider if you have one or more of the following symptoms: fever; increased redness, warmth or tenderness at the wound site; pain in joints beyond where the initial wound was; unusual discharge.

## 2018-05-10 NOTE — ED Triage Notes (Signed)
Pt states she stepped on a rusty nail to her left heel. No area noted on assessment. Unsure of last tetanus.

## 2018-06-03 ENCOUNTER — Other Ambulatory Visit: Payer: Self-pay | Admitting: Nurse Practitioner

## 2018-06-03 ENCOUNTER — Ambulatory Visit
Admission: RE | Admit: 2018-06-03 | Discharge: 2018-06-03 | Disposition: A | Discharge status: Home / Self Care | Payer: Medicare Other | Source: Ambulatory Visit | Attending: Nurse Practitioner | Admitting: Nurse Practitioner

## 2018-06-03 DIAGNOSIS — R109 Unspecified abdominal pain: Secondary | ICD-10-CM

## 2018-06-03 DIAGNOSIS — W19XXXA Unspecified fall, initial encounter: Secondary | ICD-10-CM

## 2018-06-03 DIAGNOSIS — R52 Pain, unspecified: Secondary | ICD-10-CM

## 2018-07-01 DIAGNOSIS — R10813 Right lower quadrant abdominal tenderness: Secondary | ICD-10-CM | POA: Diagnosis not present

## 2018-07-01 DIAGNOSIS — R10811 Right upper quadrant abdominal tenderness: Secondary | ICD-10-CM | POA: Diagnosis not present

## 2018-07-01 DIAGNOSIS — K5900 Constipation, unspecified: Secondary | ICD-10-CM | POA: Diagnosis not present

## 2018-09-05 ENCOUNTER — Emergency Department (HOSPITAL_COMMUNITY)
Admission: EM | Admit: 2018-09-05 | Discharge: 2018-09-05 | Disposition: A | Discharge status: Home / Self Care | Payer: Medicare Other | Attending: Emergency Medicine | Admitting: Emergency Medicine

## 2018-09-05 ENCOUNTER — Encounter (HOSPITAL_COMMUNITY): Payer: Self-pay

## 2018-09-05 ENCOUNTER — Emergency Department (HOSPITAL_COMMUNITY): Payer: Medicare Other

## 2018-09-05 DIAGNOSIS — Y999 Unspecified external cause status: Secondary | ICD-10-CM | POA: Insufficient documentation

## 2018-09-05 DIAGNOSIS — Y929 Unspecified place or not applicable: Secondary | ICD-10-CM | POA: Insufficient documentation

## 2018-09-05 DIAGNOSIS — E039 Hypothyroidism, unspecified: Secondary | ICD-10-CM | POA: Diagnosis not present

## 2018-09-05 DIAGNOSIS — I10 Essential (primary) hypertension: Secondary | ICD-10-CM | POA: Diagnosis not present

## 2018-09-05 DIAGNOSIS — Y939 Activity, unspecified: Secondary | ICD-10-CM | POA: Diagnosis not present

## 2018-09-05 DIAGNOSIS — F17210 Nicotine dependence, cigarettes, uncomplicated: Secondary | ICD-10-CM | POA: Diagnosis not present

## 2018-09-05 DIAGNOSIS — E119 Type 2 diabetes mellitus without complications: Secondary | ICD-10-CM | POA: Diagnosis not present

## 2018-09-05 DIAGNOSIS — W230XXA Caught, crushed, jammed, or pinched between moving objects, initial encounter: Secondary | ICD-10-CM | POA: Diagnosis not present

## 2018-09-05 DIAGNOSIS — S6721XA Crushing injury of right hand, initial encounter: Secondary | ICD-10-CM | POA: Insufficient documentation

## 2018-09-05 DIAGNOSIS — S6991XA Unspecified injury of right wrist, hand and finger(s), initial encounter: Secondary | ICD-10-CM | POA: Diagnosis present

## 2018-09-05 NOTE — Discharge Instructions (Addendum)
Your x-ray today did not show evidence of fracture dislocation however clinically I am concerned about your scaphoid bone in your wrist where your tenderness is located.  He will have a splint placed and please follow-up with the orthopedic hand team which you have seen in the past, Dr. Fredna Dow.  Please follow-up with him and if any symptoms change or worsen, please return to the nearest emergency department.  You may use over-the-counter pain medications to help with your discomfort.

## 2018-09-05 NOTE — ED Provider Notes (Signed)
[facility] EMERGENCY DEPARTMENT Provider Note   CSN: [id number] Arrival date & time: 09/05/18  1559     History   Chief Complaint Chief Complaint  Patient presents with  . Finger Injury    HPI Margaret Webster is a 57 y.o. female.  The history is provided by the patient and medical records. No language interpreter was used.  Hand Injury   The incident occurred 1 to 2 hours ago. Incident location: at a restaurant. The injury mechanism was compression and a direct blow. The pain is present in the right hand. The quality of the pain is described as aching. The pain is moderate. The pain has been constant since the incident. Pertinent negatives include no fever. She reports no foreign bodies present. The symptoms are aggravated by movement and palpation. She has tried nothing for the symptoms. The treatment provided no relief.    Past Medical History:  Diagnosis Date  . Arthritis    bil shoulders  . Bronchitis   . Chronic back pain   . Diabetes mellitus    stopped Mrtformin 1 mo ago, made her "sick"  . Hypertension   . Hypothyroidism   . Thyroid disease   . Trigger thumb of right hand     Patient Active Problem List   Diagnosis Date Noted  . Chest pain 01/10/2015  . Diabetes mellitus 01/10/2015  . Hypertension 01/10/2015  . Thyroid disease 01/10/2015  . Headache 01/10/2015  . Abdominal pain 01/10/2015  . Vaginitis 04/07/2014  . Pain 04/07/2014  . Vaginal irritation 04/07/2014  . Boil of buttock 04/06/2014  . BV (bacterial vaginosis) 04/06/2014  . Unspecified symptom associated with female genital organs 11/16/2013  . Candidiasis of vulva and vagina 11/16/2013  . Vaginitis and vulvovaginitis, unspecified 06/07/2013  . Pain aggravated by activities of daily living 06/07/2013  . Nonspecific abnormal finding in stool contents 12/02/2011  . Diabetes mellitus without mention of complication 09/62/8366  . Chronic back pain     Past Surgical History:   Procedure Laterality Date  . BACK SURGERY  1993  . CARPAL TUNNEL RELEASE  08/2015  . CARPAL TUNNEL RELEASE Left 09/10/2017   Procedure: CARPAL TUNNEL RELEASE;  Surgeon: Leanora Cover, MD;  Location: [facility];  Service: Orthopedics;  Laterality: Left;  . CERVICAL SPINE SURGERY Right 11/2012  . OOPHORECTOMY    . TRIGGER FINGER RELEASE Right 08/06/2017   Procedure: RIGHT THUMB TRIGGER RELEASE;  Surgeon: Leanora Cover, MD;  Location: [facility];  Service: Orthopedics;  Laterality: Right;     OB History    Gravida  1   Para  0   Term  0   Preterm  0   AB  0   Living  1     SAB  0   TAB  0   Ectopic  0   Multiple  0   Live Births  1            Home Medications    Prior to Admission medications   Medication Sig Start Date End Date Taking? Authorizing Provider  cetirizine (ZYRTEC ALLERGY) 10 MG tablet Take 1 tablet (10 mg total) by mouth daily. Patient not taking: Reported on 05/06/2018 04/15/18   Glyn Ade, PA-C  metoCLOPramide (REGLAN) 10 MG tablet Take 1 tablet (10 mg total) by mouth every 8 (eight) hours as needed for nausea (nausea/headache). 05/06/18   Ripley Fraise, MD    Family History Family History  Problem  Relation Age of Onset  . Diabetes Mother   . Cystic fibrosis Sister   . Cancer Sister        ?  Marland Kitchen Heart disease Maternal Grandmother   . Clotting disorder Maternal Grandfather     Social History Social History   Tobacco Use  . Smoking status: Current Every Day Smoker    Packs/day: 0.50    Types: Cigarettes  . Smokeless tobacco: Never Used  Substance Use Topics  . Alcohol use: No  . Drug use: Yes    Types: Marijuana    Comment: every now and then     Allergies   Sulfa antibiotics; Ibuprofen; Influenza vaccines; Lipitor [atorvastatin calcium]; and Naproxen   Review of Systems Review of Systems  Constitutional: Negative for diaphoresis, fatigue and fever.  HENT: Negative for congestion.     Eyes: Negative for visual disturbance.  Respiratory: Negative for cough, chest tightness, shortness of breath and wheezing.   Cardiovascular: Negative for chest pain, palpitations and leg swelling.  Gastrointestinal: Negative for abdominal distention, constipation, diarrhea, nausea and vomiting.  Genitourinary: Negative for dysuria and frequency.  Musculoskeletal: Negative for back pain, neck pain and neck stiffness.  Skin: Negative for rash and wound.  Neurological: Negative for light-headedness and headaches.  Psychiatric/Behavioral: Negative for agitation.  All other systems reviewed and are negative.    Physical Exam Updated Vital Signs Ht 5' 7.5" (1.715 m)   Wt 89.4 kg   BMI 30.40 kg/m   Physical Exam  Constitutional: She is oriented to person, place, and time. She appears well-developed and well-nourished. No distress.  HENT:  Head: Normocephalic and atraumatic.  Mouth/Throat: Oropharynx is clear and moist. No oropharyngeal exudate.  Eyes: Pupils are equal, round, and reactive to light. Conjunctivae are normal.  Neck: Normal range of motion. Neck supple.  Cardiovascular: Normal rate, regular rhythm and intact distal pulses.  No murmur heard. Pulmonary/Chest: Effort normal and breath sounds normal. No respiratory distress. She has no wheezes. She has no rales. She exhibits no tenderness.  Abdominal: Soft. There is no tenderness.  Musculoskeletal: She exhibits tenderness. She exhibits no edema or deformity.       Right hand: She exhibits tenderness. She exhibits normal range of motion, normal capillary refill, no deformity, no laceration and no swelling. Normal sensation noted. Normal strength noted.       Hands: Patient has tenderness in her right snuffbox.  Normal grip strength.  Normal sensation throughout the hand.  Normal cap refill.  Normal radial and ulnar pulse in right upper extremity.  No laceration seen.  No discoloration.  Neurological: She is alert and oriented  to person, place, and time. No cranial nerve deficit or sensory deficit. She exhibits normal muscle tone. Coordination normal.  Skin: Skin is warm and dry. Capillary refill takes less than 2 seconds. No rash noted. She is not diaphoretic. No erythema.  Psychiatric: She has a normal mood and affect.  Nursing note and vitals reviewed.    ED Treatments / Results  Labs (all labs ordered are listed, but only abnormal results are displayed) Labs Reviewed - No data to display  EKG None  Radiology Dg Hand Complete Right  Result Date: 09/05/2018 CLINICAL DATA:  Hand injury, thumb pain. EXAM: RIGHT HAND - COMPLETE 3+ VIEW COMPARISON:  None. FINDINGS: Osseous alignment is normal. No fracture line or displaced fracture fragment seen. No acute or suspicious osseous lesion no significant degenerative change seen. Soft tissues about the RIGHT hand are unremarkable. IMPRESSION: Negative. Electronically  Signed   By: Franki Cabot M.D.   On: 09/05/2018 17:21    Procedures Procedures (including critical care time)  Medications Ordered in ED Medications - No data to display   Initial Impression / Assessment and Plan / ED Course  I have reviewed the triage vital signs and the nursing notes.  Pertinent labs & imaging results that were available during my care of the patient were reviewed by me and considered in my medical decision making (see chart for details).     Margaret Webster is a 57 y.o. female with a past medical history significant for hypertension, diabetes, and thyroid disease who presents with a right thumb injury.  Patient reports that while going into a restaurant around lunchtime today, she had her right thumb get caught in the door handle on the outside of the door when she walked into got bent backwards.  She denied bleeding or laceration.  She is a history of carpal tunnel surgery and a trigger finger release in this hand.  She reports no preceding symptoms and denies any other  locations of discomfort.  She reports the pain is moderate at this time.  She reports the pain is primarily in the area of the snuffbox.  She denies any numbness or tingling.  No other complaints.    On exam, patient has tenderness in the right snuffbox area.  Symmetric grip strength.  Normal sensation to the tips of all fingers.  Normal cap refill.  Normal pulse in the radial and ulnar arteries.  Lungs clear chest nontender.  Exam otherwise unremarkable.  Patient will have x-ray to look for abnormality.  Anticipate placing the patient in a thumb spica splint and have her following up with her hand physician due to concern for scaphoid injury with snuffbox tenderness.  Anticipate reassessment after imaging.  5:39 PM X-ray shows no fracture or dislocation.  Due to the patient's tenderness of the snuffbox, she was placed in a thumb spica as there is concern for possible scaphoid injury.  Patient is seen by Dr. Fredna Dow and she will call him to see him in clinic.  Patient voiced understanding the plan of care as well as return precautions.  Patient discharged in good condition after splint was placed.    Final Clinical Impressions(s) / ED Diagnoses   Final diagnoses:  Crushing injury of right hand, initial encounter    ED Discharge Orders    None      Clinical Impression: 1. Crushing injury of right hand, initial encounter     Disposition: Discharge  Condition: Good  I have discussed the results, Dx and Tx plan with the pt(& family if present). He/she/they expressed understanding and agree(s) with the plan. Discharge instructions discussed at great length. Strict return precautions discussed and pt &/or family have verbalized understanding of the instructions. No further questions at time of discharge.    New Prescriptions   No medications on file    Follow Up: Leanora Cover, [place] [address] [place] EM[facility]GENCY  DEPARTMENT [address] [id number] [place] [phone]   \    Geovannie Vilar, Gwenyth Allegra, MD 09/05/18 1745

## 2018-09-05 NOTE — ED Notes (Signed)
Pt stable, ambulatory, states understanding of discharge instructions 

## 2018-09-05 NOTE — ED Triage Notes (Signed)
Right thumb caught in door around lunchtime

## 2018-09-08 ENCOUNTER — Ambulatory Visit (INDEPENDENT_AMBULATORY_CARE_PROVIDER_SITE_OTHER): Payer: Medicare Other | Admitting: Nurse Practitioner

## 2018-09-08 ENCOUNTER — Encounter: Payer: Self-pay | Admitting: Nurse Practitioner

## 2018-09-08 VITALS — BP 132/72 | HR 56 | Temp 97.5°F | Ht 67.0 in | Wt 192.4 lb

## 2018-09-08 DIAGNOSIS — R7303 Prediabetes: Secondary | ICD-10-CM | POA: Insufficient documentation

## 2018-09-08 DIAGNOSIS — S62002D Unspecified fracture of navicular [scaphoid] bone of left wrist, subsequent encounter for fracture with routine healing: Secondary | ICD-10-CM | POA: Diagnosis not present

## 2018-09-08 DIAGNOSIS — I10 Essential (primary) hypertension: Secondary | ICD-10-CM

## 2018-09-08 DIAGNOSIS — S6991XD Unspecified injury of right wrist, hand and finger(s), subsequent encounter: Secondary | ICD-10-CM

## 2018-09-08 DIAGNOSIS — Y92511 Restaurant or cafe as the place of occurrence of the external cause: Secondary | ICD-10-CM

## 2018-09-08 DIAGNOSIS — E079 Disorder of thyroid, unspecified: Secondary | ICD-10-CM

## 2018-09-08 NOTE — Progress Notes (Addendum)
Subjective:     Patient ID: Margaret Webster , female    DOB: 02-16-1961 , 57 y.o.   MRN: [id number]   Chief Complaint  Patient presents with  . Diabetes  . Hypothyroidism    HPI  Diabetes  She presents for her follow-up diabetic visit. Diabetes type: prediabetes. Her disease course has been stable. Pertinent negatives for hypoglycemia include no dizziness or nervousness/anxiousness. Pertinent negatives for diabetes include no blurred vision, no fatigue, no polydipsia, no polyphagia and no polyuria. Symptoms are stable. Risk factors for coronary artery disease include sedentary lifestyle and obesity. She is following a generally healthy diet. When asked about meal planning, she reported none. She has not had a previous visit with a dietitian. She rarely participates in exercise. She does not see a podiatrist.Eye exam is not current.  Thyroid Problem  Presents for follow-up visit. Patient reports no anxiety, constipation, diarrhea, fatigue or palpitations. The symptoms have been stable.     Past Medical History:  Diagnosis Date  . Arthritis    bil shoulders  . Bronchitis   . Chronic back pain   . Diabetes mellitus    stopped Mrtformin 1 mo ago, made her "sick"  . Hypertension   . Hypothyroidism   . Thyroid disease   . Trigger thumb of right hand      Family History  Problem Relation Age of Onset  . Diabetes Mother   . Cystic fibrosis Sister   . Cancer Sister        ?  Marland Kitchen Heart disease Maternal Grandmother   . Clotting disorder Maternal Grandfather     No current outpatient medications on file.   Allergies  Allergen Reactions  . Sulfa Antibiotics Other (See Comments)    Hard to breathe  . Ibuprofen     Stomach upset  . Influenza Vaccines Hives  . Lipitor [Atorvastatin Calcium] Other (See Comments)    Muscle ache   . Naproxen Nausea And Vomiting     Review of Systems  Constitutional: Negative for fatigue.  Eyes: Negative.  Negative for blurred vision.   Respiratory: Negative.   Cardiovascular: Negative.  Negative for palpitations.  Gastrointestinal: Negative.  Negative for abdominal distention, constipation and diarrhea.  Endocrine: Negative for polydipsia, polyphagia and polyuria.  Musculoskeletal: Negative.        Has a thumb splint in place due to scaphoid injury after getting her thumb caught in the door at [place]   Skin: Negative.   Neurological: Negative for dizziness.  Psychiatric/Behavioral: The patient is not nervous/anxious.      Today's Vitals   09/08/18 0931  BP: 132/72  Pulse: (!) 56  Temp: (!) 97.5 F (36.4 C)  TempSrc: Oral  SpO2: 95%  Weight: 192 lb 6.4 oz (87.3 kg)  Height: '5\' 7"'$  (1.702 m)  PainSc: 8   PainLoc: Hip   Body mass index is 30.13 kg/m.   Objective:  Physical Exam  Constitutional: She is oriented to person, place, and time. She appears well-developed and well-nourished.  Neck: Normal range of motion. Neck supple. No tracheal deviation present. No thyromegaly present.  Cardiovascular: Normal rate, regular rhythm and normal heart sounds.  Pulmonary/Chest: Effort normal and breath sounds normal.  Neurological: She is alert and oriented to person, place, and time.  Skin: Skin is warm and dry.  Psychiatric: She has a normal mood and affect.        Assessment And Plan:     1. Essential hypertension  Chronic, controlled  No current medications  2. Thyroid disease  Chronic, controlled  She is currently not taking any medications, was on Tirosint.   I will check her thyroid levels again today, pending results will restart. - TSH - T3, free - T4  3. Prediabetes  Chronic, controlled  No current medications  Will check HgbA1c.   - Hemoglobin A1c - BMP8+eGFR  4. Injury of right thumb, subsequent encounter  Closed hand in door at [place], seen at ER on 09/05/18  Has splint to thumb and scaphoid of right thumb  She is to follow up with Dr. Fredna Dow next week for further  evaluation      [name], FNP

## 2018-09-09 LAB — BMP8+EGFR
BUN/Creatinine Ratio: 13 (ref 9–23)
BUN: 14 mg/dL (ref 6–24)
CO2: 24 mmol/L (ref 20–29)
Calcium: 9.3 mg/dL (ref 8.7–10.2)
Chloride: 105 mmol/L (ref 96–106)
Creatinine, Ser: 1.06 mg/dL — ABNORMAL HIGH (ref 0.57–1.00)
GFR calc Af Amer: 66 mL/min/{1.73_m2} (ref >59)
GFR calc non Af Amer: 58 mL/min/{1.73_m2} — ABNORMAL LOW (ref >59)
Glucose: 86 mg/dL (ref 65–99)
Potassium: 4.6 mmol/L (ref 3.5–5.2)
Sodium: 139 mmol/L (ref 134–144)

## 2018-09-09 LAB — T3, FREE: T3, Free: 3 pg/mL (ref 2.0–4.4)

## 2018-09-09 LAB — HEMOGLOBIN A1C
Est. average glucose Bld gHb Est-mCnc: 108 mg/dL
Hgb A1c MFr Bld: 5.4 % (ref 4.8–5.6)

## 2018-09-09 LAB — T4: T4, Total: 6.6 ug/dL (ref 4.5–12.0)

## 2018-09-09 LAB — TSH: TSH: 4.43 u[IU]/mL (ref 0.450–4.500)

## 2018-10-11 ENCOUNTER — Encounter: Payer: Self-pay | Admitting: Nurse Practitioner

## 2018-10-11 ENCOUNTER — Ambulatory Visit (INDEPENDENT_AMBULATORY_CARE_PROVIDER_SITE_OTHER): Payer: Medicare Other | Admitting: Nurse Practitioner

## 2018-10-11 VITALS — BP 138/68 | HR 64 | Temp 98.0°F | Ht 66.6 in | Wt 192.0 lb

## 2018-10-11 DIAGNOSIS — Z139 Encounter for screening, unspecified: Secondary | ICD-10-CM | POA: Diagnosis not present

## 2018-10-11 DIAGNOSIS — Z0000 Encounter for general adult medical examination without abnormal findings: Secondary | ICD-10-CM

## 2018-10-11 DIAGNOSIS — I10 Essential (primary) hypertension: Secondary | ICD-10-CM

## 2018-10-11 DIAGNOSIS — R7303 Prediabetes: Secondary | ICD-10-CM

## 2018-10-11 LAB — POCT URINALYSIS DIPSTICK
Bilirubin, UA: NEGATIVE
Glucose, UA: NEGATIVE
Ketones, UA: NEGATIVE
Leukocytes, UA: NEGATIVE
Nitrite, UA: NEGATIVE
Protein, UA: POSITIVE — AB
Spec Grav, UA: 1.02 (ref 1.010–1.025)
Urobilinogen, UA: 0.2 E.U./dL
pH, UA: 5.5 (ref 5.0–8.0)

## 2018-10-11 LAB — POCT UA - MICROALBUMIN
Albumin/Creatinine Ratio, Urine, POC: >300
Creatinine, POC: 200 mg/dL
Microalbumin Ur, POC: 150 mg/L

## 2018-10-11 NOTE — Patient Instructions (Addendum)
Margaret Webster , Thank you for taking time to come for your Medicare Wellness Visit. I appreciate your ongoing commitment to your health goals. Please review the following plan we discussed and let me know if I can assist you in the future.   These are the goals we discussed: Goals    . Increase physical activity     Would like to join a gym.         This is a list of the screening recommended for you and due dates:  Health Maintenance  Topic Date Due  .  Hepatitis C: One time screening is recommended by Center for Disease Control  (CDC) for  adults born from [birth year] through 1967.   [birth year]  . Complete foot exam   03/13/1969  . Eye exam for diabetics  03/13/1969  . Urine Protein Check  03/13/1969  . HIV Screening  03/13/1974  . Flu Shot  08/26/2019*  . Pneumococcal vaccine  10/12/2019*  . Hemoglobin A1C  03/09/2019  . Mammogram  10/02/2019  . Pap Smear  04/03/2020  . Colon Cancer Screening  12/07/2021  . Tetanus Vaccine  05/10/2028  *Topic was postponed. The date shown is not the original due date.    Advance Directive Advance directives are legal documents that let you make choices ahead of time about your health care and medical treatment in case you become unable to communicate for yourself. Advance directives are a way for you to communicate your wishes to family, friends, and health care providers. This can help convey your decisions about end-of-life care if you become unable to communicate. Discussing and writing advance directives should happen over time rather than all at once. Advance directives can be changed depending on your situation and what you want, even after you have signed the advance directives. If you do not have an advance directive, some states assign family decision makers to act on your behalf based on how closely you are related to them. Each state has its own laws regarding advance directives. You may want to check with your health care provider, attorney, or  state representative about the laws in your state. There are different types of advance directives, such as:  Medical power of attorney.  Living will.  Do not resuscitate (DNR) or do not attempt resuscitation (DNAR) order.  Health care proxy and medical power of attorney A health care proxy, also called a health care agent, is a person who is appointed to make medical decisions for you in cases in which you are unable to make the decisions yourself. Generally, people choose someone they know well and trust to represent their preferences. Make sure to ask this person for an agreement to act as your proxy. A proxy may have to exercise judgment in the event of a medical decision for which your wishes are not known. A medical power of attorney is a legal document that names your health care proxy. Depending on the laws in your state, after the document is written, it may also need to be:  Signed.  Notarized.  Dated.  Copied.  Witnessed.  Incorporated into your medical record.  You may also want to appoint someone to manage your financial affairs in a situation in which you are unable to do so. This is called a durable power of attorney for finances. It is a separate legal document from the durable power of attorney for health care. You may choose the same person or someone different from your health care  proxy to act as your agent in financial matters. If you do not appoint a proxy, or if there is a concern that the proxy is not acting in your best interests, a court-appointed guardian may be designated to act on your behalf. Living will A living will is a set of instructions documenting your wishes about medical care when you cannot express them yourself. Health care providers should keep a copy of your living will in your medical record. You may want to give a copy to family members or friends. To alert caregivers in case of an emergency, you can place a card in your wallet to let them know  that you have a living will and where they can find it. A living will is used if you become:  Terminally ill.  Incapacitated.  Unable to communicate or make decisions.  Items to consider in your living will include:  The use or non-use of life-sustaining equipment, such as dialysis machines and breathing machines (ventilators).  A DNR or DNAR order, which is the instruction not to use cardiopulmonary resuscitation (CPR) if breathing or heartbeat stops.  The use or non-use of tube feeding.  Withholding of food and fluids.  Comfort (palliative) care when the goal becomes comfort rather than a cure.  Organ and tissue donation.  A living will does not give instructions for distributing your money and property if you should pass away. It is recommended that you seek the advice of a lawyer when writing a will. Decisions about taxes, beneficiaries, and asset distribution will be legally binding. This process can relieve your family and friends of any concerns surrounding disputes or questions that may come up about the distribution of your assets. DNR or DNAR A DNR or DNAR order is a request not to have CPR in the event that your heart stops beating or you stop breathing. If a DNR or DNAR order has not been made and shared, a health care provider will try to help any patient whose heart has stopped or who has stopped breathing. If you plan to have surgery, talk with your health care provider about how your DNR or DNAR order will be followed if problems occur. Summary  Advance directives are the legal documents that allow you to make choices ahead of time about your health care and medical treatment in case you become unable to communicate for yourself.  The process of discussing and writing advance directives should happen over time. You can change the advance directives, even after you have signed them.  Advance directives include DNR or DNAR orders, living wills, and designating an agent as  your medical power of attorney. This information is not intended to replace advice given to you by your health care provider. Make sure you discuss any questions you have with your health care provider. Document Released: 01/20/2008 Document Revised: 09/01/2016 Document Reviewed: 09/01/2016 Elsevier Interactive Patient Education  2017 [employer].

## 2018-10-11 NOTE — Progress Notes (Signed)
Subjective:     Patient ID: Margaret Webster , female    DOB: 10-Oct-1961 , 57 y.o.   MRN: [id number]   Chief Complaint  Patient presents with  . Medicare Wellness    HPI  PAP done by Dr. Jonelle Sidle 2019.   She usually goes to Dr. Linna Darner.     Past Medical History:  Diagnosis Date  . Arthritis    bil shoulders  . Bronchitis   . Chronic back pain   . Diabetes mellitus    stopped Mrtformin 1 mo ago, made her "sick"  . Hypertension   . Hypothyroidism   . Thyroid disease   . Trigger thumb of right hand      Family History  Problem Relation Age of Onset  . Diabetes Mother   . Cystic fibrosis Sister   . Cancer Sister        ?  Marland Kitchen Heart disease Maternal Grandmother   . Clotting disorder Maternal Grandfather      Current Outpatient Medications:  .  omeprazole (PRILOSEC) 20 MG capsule, Take 20 mg by mouth daily., Disp: , Rfl:    Allergies  Allergen Reactions  . Sulfa Antibiotics Other (See Comments)    Hard to breathe  . Ibuprofen     Stomach upset  . Influenza Vaccines Hives  . Lipitor [Atorvastatin Calcium] Other (See Comments)    Muscle ache   . Naproxen Nausea And Vomiting     Review of Systems  Constitutional: Negative.   HENT: Negative.   Eyes: Negative.   Respiratory: Negative.   Cardiovascular: Negative.   Gastrointestinal: Negative.   Endocrine: Negative.   Genitourinary: Negative.   Musculoskeletal: Negative.   Skin: Negative.   Allergic/Immunologic: Negative.   Neurological: Negative.   Hematological: Negative.   Psychiatric/Behavioral: Negative.      Today's Vitals   10/11/18 1520  BP: 138/68  Pulse: 64  Temp: 98 F (36.7 C)  TempSrc: Oral  SpO2: 95%  Weight: 192 lb (87.1 kg)  Height: 5' 6.6" (1.692 m)  PainSc: 6   PainLoc: Back   Body mass index is 30.43 kg/m.   Objective:  Physical Exam Constitutional:      Appearance: She is well-developed. She is obese.  HENT:     Head: Normocephalic and atraumatic.     Right Ear: Hearing,  tympanic membrane, ear canal and external ear normal.     Left Ear: Hearing, tympanic membrane, ear canal and external ear normal.     Nose: Nose normal.     Mouth/Throat:     Mouth: Mucous membranes are moist.  Eyes:     General: Lids are normal.     Conjunctiva/sclera: Conjunctivae normal.     Pupils: Pupils are equal, round, and reactive to light.     Funduscopic exam:    Right eye: No papilledema.        Left eye: No papilledema.  Neck:     Musculoskeletal: Full passive range of motion without pain, normal range of motion and neck supple.     Thyroid: No thyroid mass.     Vascular: No carotid bruit.  Cardiovascular:     Rate and Rhythm: Normal rate and regular rhythm.     Pulses: Normal pulses.     Heart sounds: Normal heart sounds. No murmur.  Pulmonary:     Effort: Pulmonary effort is normal.     Breath sounds: Normal breath sounds.  Abdominal:     General: Abdomen is flat. Bowel sounds  are normal.     Palpations: Abdomen is soft.  Musculoskeletal: Normal range of motion.  Skin:    General: Skin is warm and dry.     Capillary Refill: Capillary refill takes less than 2 seconds.  Neurological:     General: No focal deficit present.     Mental Status: She is alert and oriented to person, place, and time.     Cranial Nerves: No cranial nerve deficit.     Sensory: No sensory deficit.  Psychiatric:        Mood and Affect: Mood normal.        Behavior: Behavior normal.        Thought Content: Thought content normal.        Judgment: Judgment normal.         Assessment And Plan:         [name], FNP    Subjective:   Margaret Webster is a 57 y.o. female who presents for an Initial Medicare Annual Wellness Visit.  Review of Systems            Objective:    Today's Vitals   10/11/18 1520  BP: 138/68  Pulse: 64  Temp: 98 F (36.7 C)  TempSrc: Oral  SpO2: 95%  Weight: 192 lb (87.1 kg)  Height: 5' 6.6" (1.692 m)  PainSc: 6   PainLoc: Back   Body  mass index is 30.43 kg/m.  Advanced Directives 10/11/2018 09/05/2018 09/10/2017 09/08/2017 08/06/2017 07/29/2017 03/29/2017  Does Patient Have a Medical Advance Directive? No No No No No No No  Would patient like information on creating a medical advance directive? Yes (MAU/Ambulatory/Procedural Areas - Information given) No - Patient declined No - Patient declined No - Patient declined No - Patient declined - No - Patient declined    Current Medications (verified) Outpatient Encounter Medications as of 10/11/2018  Medication Sig  . omeprazole (PRILOSEC) 20 MG capsule Take 20 mg by mouth daily.   No facility-administered encounter medications on file as of 10/11/2018.     Allergies (verified) Sulfa antibiotics; Ibuprofen; Influenza vaccines; Lipitor [atorvastatin calcium]; and Naproxen   History: Past Medical History:  Diagnosis Date  . Arthritis    bil shoulders  . Bronchitis   . Chronic back pain   . Diabetes mellitus    stopped Mrtformin 1 mo ago, made her "sick"  . Hypertension   . Hypothyroidism   . Thyroid disease   . Trigger thumb of right hand    Past Surgical History:  Procedure Laterality Date  . BACK SURGERY  1993  . CARPAL TUNNEL RELEASE  08/2015  . CARPAL TUNNEL RELEASE Left 09/10/2017   Procedure: CARPAL TUNNEL RELEASE;  Surgeon: Leanora Cover, MD;  Location: [facility];  Service: Orthopedics;  Laterality: Left;  . CERVICAL SPINE SURGERY Right 11/2012  . OOPHORECTOMY    . TRIGGER FINGER RELEASE Right 08/06/2017   Procedure: RIGHT THUMB TRIGGER RELEASE;  Surgeon: Leanora Cover, MD;  Location: [facility];  Service: Orthopedics;  Laterality: Right;   Family History  Problem Relation Age of Onset  . Diabetes Mother   . Cystic fibrosis Sister   . Cancer Sister        ?  Marland Kitchen Heart disease Maternal Grandmother   . Clotting disorder Maternal Grandfather    Social History   Socioeconomic History  . Marital status: Single    Spouse  name: Not on file  . Number of children: 1  .  Years of education: Not on file  . Highest education level: Not on file  Occupational History  . Occupation: DISABLED    Employer: DISABLED  Social Needs  . Financial resource strain: Not on file  . Food insecurity:    Worry: Not on file    Inability: Not on file  . Transportation needs:    Medical: Not on file    Non-medical: Not on file  Tobacco Use  . Smoking status: Current Every Day Smoker    Packs/day: 0.50    Types: Cigarettes  . Smokeless tobacco: Never Used  Substance and Sexual Activity  . Alcohol use: No  . Drug use: Yes    Types: Marijuana    Comment: every now and then  . Sexual activity: Yes    Partners: Male    Birth control/protection: None  Lifestyle  . Physical activity:    Days per week: Not on file    Minutes per session: Not on file  . Stress: Not on file  Relationships  . Social connections:    Talks on phone: Not on file    Gets together: Not on file    Attends religious service: Not on file    Active member of club or organization: Not on file    Attends meetings of clubs or organizations: Not on file    Relationship status: Not on file  Other Topics Concern  . Not on file  Social History Narrative  . Not on file    Tobacco Counseling Ready to quit: Not Answered Counseling given: Not Answered   Clinical Intake:     Pain Score: 6                   Activities of Daily Living In your present state of health, do you have any difficulty performing the following activities: 10/11/2018  Hearing? N  Vision? Y  Difficulty concentrating or making decisions? Y  Walking or climbing stairs? Y  Dressing or bathing? N  Doing errands, shopping? N  Some recent data might be hidden     Immunizations and Health Maintenance Immunization History  Administered Date(s) Administered  . Tdap 05/27/2013, 03/21/2015, 05/10/2018   Health Maintenance Due  Topic Date Due  . Hepatitis C  Screening  [birth year]  . FOOT EXAM  03/13/1969  . OPHTHALMOLOGY EXAM  03/13/1969  . URINE MICROALBUMIN  03/13/1969  . HIV Screening  03/13/1974    Patient Care Team: [name], FNP as PCP - General (General Practice)  Indicate any recent Medical Services you may have received from other than [facility] providers in the past year (date may be approximate).     Assessment:   This is a routine wellness examination for Margaret Webster.  Hearing/Vision screen No exam data present  Dietary issues and exercise activities discussed:    Goals    . Increase physical activity     Would like to join a gym.        Depression Screen PHQ 2/9 Scores 10/11/2018 09/08/2018  PHQ - 2 Score 0 0    Fall Risk Fall Risk  10/11/2018 09/08/2018  Falls in the past year? 1 1  Number falls in past yr: 0 0  Injury with Fall? 1 0    Is the patient's home free of loose throw rugs in walkways, pet beds, electrical cords, etc?   yes      Grab bars in the bathroom? no      Handrails on the stairs?  yes      Adequate lighting?   yes  Timed Get Up and Go Performed less than 3 seconds  Cognitive Function:  Mini-Cog - 10/11/18 1518    Normal clock drawing test?  yes    How many words correct?  3              Screening Tests Health Maintenance  Topic Date Due  . Hepatitis C Screening  12-19-1960  . FOOT EXAM  03/13/1969  . OPHTHALMOLOGY EXAM  03/13/1969  . URINE MICROALBUMIN  03/13/1969  . HIV Screening  03/13/1974  . INFLUENZA VACCINE  08/26/2019 (Originally 05/27/2018)  . PNEUMOCOCCAL POLYSACCHARIDE VACCINE AGE 47-64 HIGH RISK  10/12/2019 (Originally 03/13/1961)  . HEMOGLOBIN A1C  03/09/2019  . MAMMOGRAM  10/02/2019  . PAP SMEAR-Modifier  04/03/2020  . COLONOSCOPY  12/07/2021  . TETANUS/TDAP  05/10/2028    Qualifies for Shingles Vaccine? declined  Cancer Screenings: Lung: Low Dose CT Chest recommended if Age 71-80 years, 30 pack-year currently smoking OR have quit w/in 15years. Patient  does qualify. Breast: Up to date on Mammogram? Yes   Up to date of Bone Density/Dexa? Yes Colorectal: 2019 normal  Additional Screenings:  Hepatitis C Screening:      Plan:  .   Behavior modifications discussed and diet history reviewed.   . Pt will continue to exercise regularly and modify diet with low GI, plant based foods and decrease intake of processed foods.  . Recommend intake of daily multivitamin, Vitamin D, and calcium.  . Recommend mammogram and colonoscopy for preventive screenings, as well as recommend immunizations that include influenza, TDAP, and Shingles   I have personally reviewed and noted the following in the patient's chart:   . Medical and social history . Use of alcohol, tobacco or illicit drugs  . Current medications and supplements . Functional ability and status . Nutritional status . Physical activity . Advanced directives . List of other physicians . Hospitalizations, surgeries, and ER visits in previous 12 months . Vitals . Screenings to include cognitive, depression, and falls . Referrals and appointments  In addition, I have reviewed and discussed with patient certain preventive protocols, quality metrics, and best practice recommendations. A written personalized care plan for preventive services as well as general preventive health recommendations were provided to patient.     [name], FNP   10/11/2018

## 2018-10-12 LAB — HIV ANTIBODY (ROUTINE TESTING W REFLEX): HIV Screen 4th Generation wRfx: NONREACTIVE

## 2018-10-12 LAB — HEPATITIS C ANTIBODY: Hep C Virus Ab: <0.1 s/co ratio (ref 0.0–0.9)

## 2018-10-19 ENCOUNTER — Encounter: Payer: Self-pay | Admitting: Nurse Practitioner

## 2018-10-19 ENCOUNTER — Emergency Department (HOSPITAL_COMMUNITY)
Admission: EM | Admit: 2018-10-19 | Discharge: 2018-10-19 | Disposition: A | Discharge status: Home / Self Care | Payer: Medicare Other | Attending: Emergency Medicine | Admitting: Emergency Medicine

## 2018-10-19 ENCOUNTER — Other Ambulatory Visit: Payer: Self-pay

## 2018-10-19 ENCOUNTER — Encounter (HOSPITAL_COMMUNITY): Payer: Self-pay | Admitting: Emergency Medicine

## 2018-10-19 DIAGNOSIS — Y999 Unspecified external cause status: Secondary | ICD-10-CM | POA: Diagnosis not present

## 2018-10-19 DIAGNOSIS — Y92009 Unspecified place in unspecified non-institutional (private) residence as the place of occurrence of the external cause: Secondary | ICD-10-CM | POA: Diagnosis not present

## 2018-10-19 DIAGNOSIS — I10 Essential (primary) hypertension: Secondary | ICD-10-CM | POA: Insufficient documentation

## 2018-10-19 DIAGNOSIS — S39012A Strain of muscle, fascia and tendon of lower back, initial encounter: Secondary | ICD-10-CM

## 2018-10-19 DIAGNOSIS — E039 Hypothyroidism, unspecified: Secondary | ICD-10-CM | POA: Diagnosis not present

## 2018-10-19 DIAGNOSIS — F1290 Cannabis use, unspecified, uncomplicated: Secondary | ICD-10-CM | POA: Insufficient documentation

## 2018-10-19 DIAGNOSIS — F17210 Nicotine dependence, cigarettes, uncomplicated: Secondary | ICD-10-CM | POA: Insufficient documentation

## 2018-10-19 DIAGNOSIS — E119 Type 2 diabetes mellitus without complications: Secondary | ICD-10-CM | POA: Insufficient documentation

## 2018-10-19 DIAGNOSIS — Y93E9 Activity, other interior property and clothing maintenance: Secondary | ICD-10-CM | POA: Diagnosis not present

## 2018-10-19 DIAGNOSIS — X503XXA Overexertion from repetitive movements, initial encounter: Secondary | ICD-10-CM | POA: Diagnosis not present

## 2018-10-19 DIAGNOSIS — M545 Low back pain: Secondary | ICD-10-CM | POA: Diagnosis present

## 2018-10-19 MED ORDER — OXYCODONE-ACETAMINOPHEN 5-325 MG PO TABS
1.0000 | ORAL_TABLET | Freq: Once | ORAL | Status: AC
  Administered 2018-10-19: 1 via ORAL
  Filled 2018-10-19: qty 1

## 2018-10-19 MED ORDER — METHYLPREDNISOLONE 4 MG PO TBPK: ORAL_TABLET | ORAL | 0 refills | Status: DC

## 2018-10-19 MED ORDER — CYCLOBENZAPRINE HCL 10 MG PO TABS: 10.0000 mg | ORAL_TABLET | Freq: Two times a day (BID) | ORAL | 0 refills | Status: DC | PRN

## 2018-10-19 NOTE — ED Notes (Signed)
ED Provider at bedside. 

## 2018-10-19 NOTE — ED Triage Notes (Signed)
C/o lower back pain since Saturday.  States pain is due to standing in kitchen cleaning chitlins for an extended amount of time.  History of chronic lower back pain.  Ambulatory to triage.

## 2018-10-19 NOTE — ED Notes (Signed)
Patient verbalizes understanding of discharge instructions. Opportunity for questioning and answers were provided. Armband removed by staff, pt discharged from ED ambulatory.   

## 2018-10-19 NOTE — ED Provider Notes (Signed)
[facility] EMERGENCY DEPARTMENT Provider Note   CSN: [id number] Arrival date & time: 10/19/18  8413     History   Chief Complaint Chief Complaint  Patient presents with  . Back Pain    HPI Margaret Webster is a 57 y.o. female with a past medical history of chronic back pain, status post lower back surgery (unable to specify) in the 1990s, who presents to ED for 3-day history of gradually worsening lower back pain.  States that the pain is is aching, worse with movement, palpation located in her lower back at the midline, right side and radiating down her right leg.  She was standing up for the past 4 to 5 days cleaning chit linens in her kitchen in preparation of Christmas.  She took several Tylenols with no improvement in her symptoms.  States that she has had a history of similar symptoms in the past with standing for long periods of time.  States that most of the pain is on the right side as she has used her right hand and arm more.  She denies any injuries or falls, numbness in arms or legs, history of IV drug use, fever, loss of bowel or bladder function, shortness of breath, abdominal pain.  HPI  Past Medical History:  Diagnosis Date  . Arthritis    bil shoulders  . Bronchitis   . Chronic back pain   . Diabetes mellitus    stopped Mrtformin 1 mo ago, made her "sick"  . Hypertension   . Hypothyroidism   . Thyroid disease   . Trigger thumb of right hand     Patient Active Problem List   Diagnosis Date Noted  . Prediabetes 09/08/2018  . Chest pain 01/10/2015  . Diabetes mellitus 01/10/2015  . Hypertension 01/10/2015  . Thyroid disease 01/10/2015  . Headache 01/10/2015  . Abdominal pain 01/10/2015  . Vaginitis 04/07/2014  . Pain 04/07/2014  . Vaginal irritation 04/07/2014  . Boil of buttock 04/06/2014  . BV (bacterial vaginosis) 04/06/2014  . Unspecified symptom associated with female genital organs 11/16/2013  . Candidiasis of vulva and vagina  11/16/2013  . Vaginitis and vulvovaginitis, unspecified 06/07/2013  . Pain aggravated by activities of daily living 06/07/2013  . Nonspecific abnormal finding in stool contents 12/02/2011  . Diabetes mellitus without mention of complication 24/40/1027  . Chronic back pain     Past Surgical History:  Procedure Laterality Date  . BACK SURGERY  1993  . CARPAL TUNNEL RELEASE  08/2015  . CARPAL TUNNEL RELEASE Left 09/10/2017   Procedure: CARPAL TUNNEL RELEASE;  Surgeon: Leanora Cover, MD;  Location: [place];  Service: Orthopedics;  Laterality: Left;  . CERVICAL SPINE SURGERY Right 11/2012  . OOPHORECTOMY    . TRIGGER FINGER RELEASE Right 08/06/2017   Procedure: RIGHT THUMB TRIGGER RELEASE;  Surgeon: Leanora Cover, MD;  Location: [facility];  Service: Orthopedics;  Laterality: Right;     OB History    Gravida  1   Para  0   Term  0   Preterm  0   AB  0   Living  1     SAB  0   TAB  0   Ectopic  0   Multiple  0   Live Births  1            Home Medications    Prior to Admission medications   Medication Sig Start Date End Date Taking? Authorizing Provider  cyclobenzaprine (FLEXERIL) 10 MG tablet Take 1 tablet (10 mg total) by mouth 2 (two) times daily as needed for muscle spasms. 10/19/18   Jeevan Kalla, PA-C  methylPREDNISolone (MEDROL DOSEPAK) 4 MG TBPK tablet Taper over 6 days. 10/19/18   Shenia Alan, PA-C  omeprazole (PRILOSEC) 20 MG capsule Take 20 mg by mouth daily.    [provider]    Family History Family History  Problem Relation Age of Onset  . Diabetes Mother   . Cystic fibrosis Sister   . Cancer Sister        ?  Marland Kitchen Heart disease Maternal Grandmother   . Clotting disorder Maternal Grandfather     Social History Social History   Tobacco Use  . Smoking status: Current Every Day Smoker    Packs/day: 0.50    Types: Cigarettes  . Smokeless tobacco: Never Used  Substance Use Topics  . Alcohol use: No    . Drug use: Yes    Types: Marijuana    Comment: every now and then     Allergies   Sulfa antibiotics; Ibuprofen; Influenza vaccines; Lipitor [atorvastatin calcium]; and Naproxen   Review of Systems Review of Systems  Constitutional: Negative for chills and fever.  Musculoskeletal: Positive for back pain and myalgias. Negative for arthralgias.  Neurological: Negative for weakness and numbness.     Physical Exam Updated Vital Signs BP (!) 143/77 (BP Location: Right Arm)   Pulse (!) 53   Temp 98.1 F (36.7 C) (Oral)   Resp 18   SpO2 97%   Physical Exam Vitals signs and nursing note reviewed.  Constitutional:      General: She is not in acute distress.    Appearance: She is well-developed. She is not diaphoretic.  HENT:     Head: Normocephalic and atraumatic.  Eyes:     General: No scleral icterus.    Conjunctiva/sclera: Conjunctivae normal.  Neck:     Musculoskeletal: Normal range of motion.  Pulmonary:     Effort: Pulmonary effort is normal. No respiratory distress.  Abdominal:     Tenderness: There is no abdominal tenderness.  Musculoskeletal:       Back:     Comments: No midline spinal tenderness present in lumbar, thoracic or cervical spine. No step-off palpated. No visible bruising, edema or temperature change noted. No objective signs of numbness present. No saddle anesthesia. 2+ DP pulses bilaterally. Sensation intact to light touch. Strength 5/5 in bilateral lower extremities.  Skin:    Findings: No rash.  Neurological:     Mental Status: She is alert.      ED Treatments / Results  Labs (all labs ordered are listed, but only abnormal results are displayed) Labs Reviewed - No data to display  EKG None  Radiology No results found.  Procedures Procedures (including critical care time)  Medications Ordered in ED Medications  oxyCODONE-acetaminophen (PERCOCET/ROXICET) 5-325 MG per tablet 1 tablet (has no administration in time range)      Initial Impression / Assessment and Plan / ED Course  I have reviewed the triage vital signs and the nursing notes.  Pertinent labs & imaging results that were available during my care of the patient were reviewed by me and considered in my medical decision making (see chart for details).     57 year old female with a past medical history of lower back surgery in the 1990s resents to ED for 3-day history of gradually worsening lower back pain.  Symptoms appear musculoskeletal in nature although  due to her age and no recent imaging of her back I did offer an x-ray.  Patient declines as she states that she has no history of trauma recently and denies any other neurological symptoms. Patient denies any concerning symptoms suggestive of cauda equina requiring urgent imaging at this time such as loss of sensation in the lower extremities, lower extremity weakness, loss of bowel or bladder control, saddle anesthesia, urinary retention, fever/chills, IVDU. Exam demonstrated no  weakness on exam today. No preceding injury or trauma to suggest acute fracture. Doubt pelvic or urinary pathology for patient's acute back pain, as patient denies urinary symptoms. Doubt AAA as cause of patient's back pain as patient lacks  major risk factors, had no abdominal TTP, and has symmetric and intact distal pulses. Patient given strict return precautions for any symptoms indicating worsening neurologic function in the lower extremities.  Patient is hemodynamically stable, in NAD, and able to ambulate in the ED. Evaluation does not show pathology that would require ongoing emergent intervention or inpatient treatment. I explained the diagnosis to the patient. Pain has been managed and has no complaints prior to discharge. Patient is comfortable with above plan and is stable for discharge at this time. All questions were answered prior to disposition. Strict return precautions for returning to the ED were discussed.  Encouraged follow up with PCP.    Portions of this note were generated with [employer]. Dictation errors may occur despite best attempts at proofreading.  Final Clinical Impressions(s) / ED Diagnoses   Final diagnoses:  Strain of lumbar region, initial encounter    ED Discharge Orders         Ordered    cyclobenzaprine (FLEXERIL) 10 MG tablet  2 times daily PRN     10/19/18 0747    methylPREDNISolone (MEDROL DOSEPAK) 4 MG TBPK tablet     10/19/18 0747           Delia Heady, PA-C 10/19/18 0748    Drenda Freeze, MD 10/20/18 [phone]

## 2018-10-19 NOTE — Discharge Instructions (Signed)
Return to ED for worsening symptoms, injuries or falls, losing control of your bowels or bladder, chest pain, shortness of breath.

## 2018-11-11 ENCOUNTER — Ambulatory Visit (INDEPENDENT_AMBULATORY_CARE_PROVIDER_SITE_OTHER): Payer: Medicare Other | Admitting: Nurse Practitioner

## 2018-11-11 ENCOUNTER — Encounter: Payer: Self-pay | Admitting: Nurse Practitioner

## 2018-11-11 VITALS — BP 124/86 | HR 64 | Temp 98.3°F | Ht 66.6 in | Wt 197.8 lb

## 2018-11-11 DIAGNOSIS — M25551 Pain in right hip: Secondary | ICD-10-CM | POA: Diagnosis not present

## 2018-11-11 DIAGNOSIS — M5441 Lumbago with sciatica, right side: Secondary | ICD-10-CM | POA: Diagnosis not present

## 2018-11-11 DIAGNOSIS — M1991 Primary osteoarthritis, unspecified site: Secondary | ICD-10-CM | POA: Diagnosis not present

## 2018-11-11 MED ORDER — TRIAMCINOLONE ACETONIDE 40 MG/ML IJ SUSP
60.0000 mg | Freq: Once | INTRAMUSCULAR | Status: AC
  Administered 2018-11-11: 60 mg via INTRAMUSCULAR

## 2018-11-11 MED ORDER — KETOROLAC TROMETHAMINE 60 MG/2ML IM SOLN
60.0000 mg | Freq: Once | INTRAMUSCULAR | Status: AC
  Administered 2018-11-11: 60 mg via INTRAMUSCULAR

## 2018-11-11 NOTE — Progress Notes (Signed)
Subjective:     Patient ID: Margaret Webster , female    DOB: Oct 04, 1961 , 58 y.o.   MRN: [id number]   Chief Complaint  Patient presents with  . Back Pain    patient has been having since new years and the medicine that was given to her in the hospital is not helping.    HPI  Back Pain  This is a new problem. The current episode started 1 to 4 weeks ago. The problem occurs 2 to 4 times per day. The problem has been gradually worsening since onset. The quality of the pain is described as aching. Pertinent negatives include no chest pain, fever or numbness.  Shoulder Pain   The pain is present in the right shoulder, right arm and right hip. This is a new problem. The current episode started 1 to 4 weeks ago. There has been no history of extremity trauma (had been cooking food for the Holiday and began having shoulder back and leg pain ). The problem occurs intermittently. The problem has been gradually worsening. The quality of the pain is described as aching, burning and sharp. The pain is at a severity of 8/10. The pain is moderate. Associated symptoms include stiffness. Pertinent negatives include no fever, inability to bear weight or numbness. She has tried OTC pain meds for the symptoms. Her past medical history is significant for osteoarthritis.     Past Medical History:  Diagnosis Date  . Arthritis    bil shoulders  . Bronchitis   . Chronic back pain   . Diabetes mellitus    stopped Mrtformin 1 mo ago, made her "sick"  . Hypertension   . Hypothyroidism   . Thyroid disease   . Trigger thumb of right hand      Family History  Problem Relation Age of Onset  . Diabetes Mother   . Cystic fibrosis Sister   . Cancer Sister        ?  Marland Kitchen Heart disease Maternal Grandmother   . Clotting disorder Maternal Grandfather     No current outpatient medications on file.   Allergies  Allergen Reactions  . Sulfa Antibiotics Other (See Comments)    Hard to breathe  . Ibuprofen    Stomach upset  . Influenza Vaccines Hives  . Lipitor [Atorvastatin Calcium] Other (See Comments)    Muscle ache   . Naproxen Nausea And Vomiting     Review of Systems  Constitutional: Negative for fever.  Respiratory: Negative.   Cardiovascular: Negative.  Negative for chest pain, palpitations and leg swelling.  Musculoskeletal: Positive for arthralgias, back pain and stiffness.       Right hip pain.    Neurological: Negative for dizziness, light-headedness and numbness.     Today's Vitals   11/11/18 1559  BP: 124/86  Pulse: 64  Temp: 98.3 F (36.8 C)  TempSrc: Oral  SpO2: 95%  Weight: 197 lb 12.8 oz (89.7 kg)  Height: 5' 6.6" (1.692 m)  PainSc: 8   PainLoc: Hip   Body mass index is 31.35 kg/m.   Objective:  Physical Exam Vitals signs reviewed.  Constitutional:      Appearance: Normal appearance.  Cardiovascular:     Rate and Rhythm: Normal rate and regular rhythm.     Pulses: Normal pulses.     Heart sounds: Normal heart sounds. No murmur.  Pulmonary:     Effort: Pulmonary effort is normal. No respiratory distress.     Breath sounds: Normal breath sounds.  Neurological:     Mental Status: She is alert.         Assessment And Plan:     1. Primary osteoarthritis, unspecified site  Seen at ER 10/19/18 for right shoulder pain given muscle relaxer and dexamethasone, has been ineffective now radiating down right side of back and hip.  Tenderness to right shoulder bursa space, right lateral back and right trochanter area  Will treat with toradol and kenalog, will consider pain cream for future if no improvement - ketorolac (TORADOL) injection 60 mg - triamcinolone acetonide (KENALOG-40) injection 60 mg  2. Acute right-sided low back pain with right-sided sciatica  See # 1 - ketorolac (TORADOL) injection 60 mg - triamcinolone acetonide (KENALOG-40) injection 60 mg       [name], FNP

## 2018-11-15 DIAGNOSIS — K219 Gastro-esophageal reflux disease without esophagitis: Secondary | ICD-10-CM | POA: Diagnosis not present

## 2018-11-15 DIAGNOSIS — R1084 Generalized abdominal pain: Secondary | ICD-10-CM | POA: Diagnosis not present

## 2018-11-22 ENCOUNTER — Other Ambulatory Visit: Payer: Self-pay | Admitting: Internal Medicine

## 2018-11-22 DIAGNOSIS — Z1231 Encounter for screening mammogram for malignant neoplasm of breast: Secondary | ICD-10-CM

## 2018-11-23 ENCOUNTER — Encounter: Payer: Self-pay | Admitting: Nurse Practitioner

## 2018-12-02 ENCOUNTER — Ambulatory Visit
Admission: RE | Admit: 2018-12-02 | Discharge: 2018-12-02 | Disposition: A | Discharge status: Home / Self Care | Payer: Medicare Other | Source: Ambulatory Visit | Attending: Internal Medicine | Admitting: Internal Medicine

## 2018-12-02 DIAGNOSIS — Z1231 Encounter for screening mammogram for malignant neoplasm of breast: Secondary | ICD-10-CM

## 2018-12-09 ENCOUNTER — Ambulatory Visit (INDEPENDENT_AMBULATORY_CARE_PROVIDER_SITE_OTHER): Payer: Medicare Other | Admitting: Obstetrics

## 2018-12-09 ENCOUNTER — Other Ambulatory Visit (HOSPITAL_COMMUNITY)
Admission: RE | Admit: 2018-12-09 | Discharge: 2018-12-09 | Disposition: A | Discharge status: Home / Self Care | Payer: Medicare Other | Source: Ambulatory Visit | Attending: Obstetrics | Admitting: Obstetrics

## 2018-12-09 ENCOUNTER — Encounter: Payer: Self-pay | Admitting: Obstetrics

## 2018-12-09 VITALS — BP 155/90 | HR 59 | Ht 66.0 in | Wt 194.0 lb

## 2018-12-09 DIAGNOSIS — Z124 Encounter for screening for malignant neoplasm of cervix: Secondary | ICD-10-CM | POA: Insufficient documentation

## 2018-12-09 DIAGNOSIS — Z01419 Encounter for gynecological examination (general) (routine) without abnormal findings: Secondary | ICD-10-CM | POA: Diagnosis not present

## 2018-12-09 DIAGNOSIS — R140 Abdominal distension (gaseous): Secondary | ICD-10-CM

## 2018-12-09 DIAGNOSIS — N898 Other specified noninflammatory disorders of vagina: Secondary | ICD-10-CM

## 2018-12-09 DIAGNOSIS — R102 Pelvic and perineal pain: Secondary | ICD-10-CM

## 2018-12-09 DIAGNOSIS — F17200 Nicotine dependence, unspecified, uncomplicated: Secondary | ICD-10-CM

## 2018-12-09 MED ORDER — OXYCODONE-ACETAMINOPHEN 10-325 MG PO TABS: 1.0000 | ORAL_TABLET | ORAL | 0 refills | Status: DC | PRN

## 2018-12-09 NOTE — Progress Notes (Signed)
Subjective:        Margaret Webster is a 58 y.o. female here for a routine exam.  Current complaints: Right-sided pain in inguinal area.  Also has frequent bloating.    Personal health questionnaire:  Is patient Ashkenazi Jewish, have a family history of breast and/or ovarian cancer: no Is there a family history of uterine cancer diagnosed at age < 55, gastrointestinal cancer, urinary tract cancer, family member who is a Field seismologist syndrome-associated carrier: no Is the patient overweight and hypertensive, family history of diabetes, personal history of gestational diabetes, preeclampsia or PCOS: no Is patient over 13, have PCOS,  family history of premature CHD under age 74, diabetes, smoke, have hypertension or peripheral artery disease:  no At any time, has a partner hit, kicked or otherwise hurt or frightened you?: no Over the past 2 weeks, have you felt down, depressed or hopeless?: no Over the past 2 weeks, have you felt little interest or pleasure in doing things?:no   Gynecologic History No LMP recorded. Patient is postmenopausal. Contraception: post menopausal status Last Pap: 2018. Results were: normal Last mammogram: 2020. Results were: normal  Obstetric History OB History  Gravida Para Term Preterm AB Living  1 0 0 0 0 1  SAB TAB Ectopic Multiple Live Births  0 0 0 0 1    # Outcome Date GA Lbr Len/2nd Weight Sex Delivery Anes PTL Lv  1 Gravida 03/25/76    F Vag-Spont   LIV    Past Medical History:  Diagnosis Date  . Arthritis    bil shoulders  . Bronchitis   . Chronic back pain   . Diabetes mellitus    stopped Mrtformin 1 mo ago, made her "sick"  . Hypertension   . Hypothyroidism   . Thyroid disease   . Trigger thumb of right hand     Past Surgical History:  Procedure Laterality Date  . BACK SURGERY  1993  . CARPAL TUNNEL RELEASE  08/2015  . CARPAL TUNNEL RELEASE Left 09/10/2017   Procedure: CARPAL TUNNEL RELEASE;  Surgeon: Leanora Cover, MD;  Location:  [place];  Service: Orthopedics;  Laterality: Left;  . CERVICAL SPINE SURGERY Right 11/2012  . OOPHORECTOMY    . TRIGGER FINGER RELEASE Right 08/06/2017   Procedure: RIGHT THUMB TRIGGER RELEASE;  Surgeon: Leanora Cover, MD;  Location: [place];  Service: Orthopedics;  Laterality: Right;     Current Outpatient Medications:  .  oxyCODONE-acetaminophen (PERCOCET) 10-325 MG tablet, Take 1 tablet by mouth every 4 (four) hours as needed for pain., Disp: 20 tablet, Rfl: 0 Allergies  Allergen Reactions  . Sulfa Antibiotics Other (See Comments)    Hard to breathe  . Ibuprofen     Stomach upset  . Influenza Vaccines Hives  . Lipitor [Atorvastatin Calcium] Other (See Comments)    Muscle ache   . Naproxen Nausea And Vomiting    Social History   Tobacco Use  . Smoking status: Current Every Day Smoker    Packs/day: 0.50    Types: Cigarettes  . Smokeless tobacco: Never Used  . Tobacco comment: 4 cig a day   Substance Use Topics  . Alcohol use: No    Family History  Problem Relation Age of Onset  . Diabetes Mother   . Cystic fibrosis Sister   . Cancer Sister        ?  Marland Kitchen Heart disease Maternal Grandmother   . Clotting disorder Maternal Grandfather  Review of Systems  Constitutional: negative for fatigue and weight loss Respiratory: negative for cough and wheezing Cardiovascular: negative for chest pain, fatigue and palpitations Gastrointestinal: negative for abdominal pain and change in bowel habits Musculoskeletal:negative for myalgias Neurological: negative for gait problems and tremors Behavioral/Psych: negative for abusive relationship, depression Endocrine: negative for temperature intolerance    Genitourinary:negative for abnormal menstrual periods, genital lesions, hot flashes, sexual problems and vaginal discharge Integument/breast: negative for breast lump, breast tenderness, nipple discharge and skin lesion(s)    Objective:        BP (!) 155/90   Pulse (!) 59   Ht 5\' 6"  (1.676 m)   Wt 194 lb (88 kg)   BMI 31.31 kg/m  General:   alert  Skin:   no rash or abnormalities  Lungs:   clear to auscultation bilaterally  Heart:   regular rate and rhythm, S1, S2 normal, no murmur, click, rub or gallop  Breasts:   normal without suspicious masses, skin or nipple changes or axillary nodes  Abdomen:  normal findings: no organomegaly, soft, non-tender and no hernia  Pelvis:  External genitalia: normal general appearance Urinary system: urethral meatus normal and bladder without fullness, nontender Vaginal: normal without tenderness, induration or masses Cervix: normal appearance Adnexa: normal bimanual exam Uterus: anteverted and non-tender, normal size   Lab Review Urine pregnancy test Labs reviewed yes Radiologic studies reviewed yes  50% of 20 min visit spent on counseling and coordination of care.   Assessment:     1. Encounter for routine gynecological examination with Papanicolaou smear of cervix  2. Vaginal discharge Rx: - Cervicovaginal ancillary only( [place])  3. Screening for cervical cancer Rx: - Cytology - PAP - Cervicovaginal ancillary only( [place])  4. Pelvic pain Rx: - US PELVIC COMPLETE WITH TRANSVAGINAL; Future - oxyCODONE-acetaminophen (PERCOCET) 10-325 MG tablet; Take 1 tablet by mouth every 4 (four) hours as needed for pain.  Dispense: 20 tablet; Refill: 0   5. Abdominal bloating Rx: - US PELVIC COMPLETE WITH TRANSVAGINAL; Future  6. Tobacco dependence - recommended program of tobacco cessation with medication and behavioral modification    Plan:    Education reviewed: calcium supplements, depression evaluation, low fat, low cholesterol diet, safe sex/STD prevention, self breast exams and smoking cessation. Follow up in: 2 weeks.   Meds ordered this encounter  Medications  . oxyCODONE-acetaminophen (PERCOCET) 10-325 MG tablet    Sig: Take 1 tablet by mouth every 4  (four) hours as needed for pain.    Dispense:  20 tablet    Refill:  0   Orders Placed This Encounter  Procedures  . US PELVIC COMPLETE WITH TRANSVAGINAL    Standing Status:   Future    Standing Expiration Date:   02/07/2020    Order Specific Question:   Reason for Exam (SYMPTOM  OR DIAGNOSIS REQUIRED)    Answer:   Pelvic pain.  Bloating.    Order Specific Question:   Preferred imaging location?    Answer:   [facility] Simona Huh MD 20-13-2020

## 2018-12-09 NOTE — Progress Notes (Signed)
Patient presents for Annual Exam today. Per pt no longer a diabetic, no longer has HTN.  CC: Vaginal pain for 2 weeks now, 7/10x comes and goes. Pt also notes vaginal discharge.   Last Pap:04/03/2017   per pt no Hx of Abnormal pap.  Mammogram: 12/02/2018 WNL STD Screening: Declines

## 2018-12-10 LAB — CERVICOVAGINAL ANCILLARY ONLY
Bacterial vaginitis: POSITIVE — AB
Candida vaginitis: NEGATIVE
Chlamydia: NEGATIVE
Neisseria Gonorrhea: NEGATIVE
Trichomonas: NEGATIVE

## 2018-12-11 ENCOUNTER — Other Ambulatory Visit: Payer: Self-pay | Admitting: Obstetrics

## 2018-12-11 DIAGNOSIS — N760 Acute vaginitis: Principal | ICD-10-CM

## 2018-12-11 DIAGNOSIS — B9689 Other specified bacterial agents as the cause of diseases classified elsewhere: Secondary | ICD-10-CM

## 2018-12-11 MED ORDER — METRONIDAZOLE 500 MG PO TABS: 500.0000 mg | ORAL_TABLET | Freq: Two times a day (BID) | ORAL | 2 refills | Status: DC

## 2018-12-13 DIAGNOSIS — K219 Gastro-esophageal reflux disease without esophagitis: Secondary | ICD-10-CM | POA: Diagnosis not present

## 2018-12-13 DIAGNOSIS — R1084 Generalized abdominal pain: Secondary | ICD-10-CM | POA: Diagnosis not present

## 2018-12-13 LAB — CYTOLOGY - PAP
Adequacy: ABSENT
Diagnosis: NEGATIVE
HPV: NOT DETECTED

## 2018-12-21 ENCOUNTER — Ambulatory Visit (HOSPITAL_COMMUNITY)
Admission: RE | Admit: 2018-12-21 | Discharge: 2018-12-21 | Disposition: A | Discharge status: Home / Self Care | Payer: Medicare Other | Source: Ambulatory Visit | Attending: Obstetrics | Admitting: Obstetrics

## 2018-12-21 DIAGNOSIS — R140 Abdominal distension (gaseous): Secondary | ICD-10-CM | POA: Insufficient documentation

## 2018-12-21 DIAGNOSIS — R102 Pelvic and perineal pain: Secondary | ICD-10-CM | POA: Diagnosis present

## 2018-12-23 ENCOUNTER — Ambulatory Visit (INDEPENDENT_AMBULATORY_CARE_PROVIDER_SITE_OTHER): Payer: Medicare Other | Admitting: Obstetrics

## 2018-12-23 ENCOUNTER — Encounter: Payer: Self-pay | Admitting: Obstetrics

## 2018-12-23 VITALS — BP 157/84 | HR 52 | Wt 197.0 lb

## 2018-12-23 DIAGNOSIS — R102 Pelvic and perineal pain: Secondary | ICD-10-CM | POA: Diagnosis not present

## 2018-12-23 DIAGNOSIS — R140 Abdominal distension (gaseous): Secondary | ICD-10-CM

## 2018-12-23 NOTE — Progress Notes (Signed)
Patient is in the office for follow up for U/S results.

## 2018-12-23 NOTE — Progress Notes (Signed)
Patient ID: Margaret Webster, female   DOB: 1961-09-03, 58 y.o.   MRN: [id number]  Chief Complaint  Patient presents with  . Follow-up    HPI Margaret Webster is a 58 y.o. female.  History of pelvic pain and bloating.  Ultrasound ordered.  She presents today for results. HPI  Past Medical History:  Diagnosis Date  . Arthritis    bil shoulders  . Bronchitis   . Chronic back pain   . Diabetes mellitus    stopped Mrtformin 1 mo ago, made her "sick"  . Hypertension   . Hypothyroidism   . Thyroid disease   . Trigger thumb of right hand     Past Surgical History:  Procedure Laterality Date  . BACK SURGERY  1993  . CARPAL TUNNEL RELEASE  08/2015  . CARPAL TUNNEL RELEASE Left 09/10/2017   Procedure: CARPAL TUNNEL RELEASE;  Surgeon: Leanora Cover, MD;  Location: [place];  Service: Orthopedics;  Laterality: Left;  . CERVICAL SPINE SURGERY Right 11/2012  . OOPHORECTOMY    . TRIGGER FINGER RELEASE Right 08/06/2017   Procedure: RIGHT THUMB TRIGGER RELEASE;  Surgeon: Leanora Cover, MD;  Location: [place];  Service: Orthopedics;  Laterality: Right;    Family History  Problem Relation Age of Onset  . Diabetes Mother   . Cystic fibrosis Sister   . Cancer Sister        ?  Marland Kitchen Heart disease Maternal Grandmother   . Clotting disorder Maternal Grandfather     Social History Social History   Tobacco Use  . Smoking status: Current Every Day Smoker    Packs/day: 0.50    Types: Cigarettes  . Smokeless tobacco: Never Used  . Tobacco comment: 4 cig a day   Substance Use Topics  . Alcohol use: No  . Drug use: Yes    Allergies  Allergen Reactions  . Sulfa Antibiotics Other (See Comments)    Hard to breathe  . Ibuprofen     Stomach upset  . Influenza Vaccines Hives  . Lipitor [Atorvastatin Calcium] Other (See Comments)    Muscle ache   . Naproxen Nausea And Vomiting    Current Outpatient Medications  Medication Sig Dispense Refill  .  metroNIDAZOLE (FLAGYL) 500 MG tablet Take 1 tablet (500 mg total) by mouth 2 (two) times daily. (Patient not taking: Reported on 12/23/2018) 14 tablet 2  . oxyCODONE-acetaminophen (PERCOCET) 10-325 MG tablet Take 1 tablet by mouth every 4 (four) hours as needed for pain. (Patient not taking: Reported on 12/23/2018) 20 tablet 0   No current facility-administered medications for this visit.     Review of Systems Review of Systems Constitutional: negative for fatigue and weight loss Respiratory: negative for cough and wheezing Cardiovascular: negative for chest pain, fatigue and palpitations Gastrointestinal: positive for abdominal pain and bloating Genitourinary:positive for pelvic pain Integument/breast: negative for nipple discharge Musculoskeletal:negative for myalgias Neurological: negative for gait problems and tremors Behavioral/Psych: negative for abusive relationship, depression Endocrine: negative for temperature intolerance      Blood pressure (!) 157/84, pulse (!) 52, weight 197 lb (89.4 kg).  Physical Exam Physical Exam:  Deferred >% of 15 min visit spent on counseling and coordination of care.   Data Reviewed Ultrasound: US PELVIC COMPLETE WITH TRANSVAGINAL (Accession [id number]) (Order [id number])  Imaging  Date: 12/21/2018 Department: [place] Released By: Adelene Amas, NT Authorizing: Shelly Bombard, MD  Exam Information   Status Exam Begun  Exam Ended  Final [99] 12/21/2018 8:20 AM 12/21/2018 8:43 AM  PACS Images   Show images for US PELVIC COMPLETE WITH TRANSVAGINAL  Study Result   CLINICAL DATA:  Pelvic pain x2 months, abdominal bloating  EXAM: TRANSABDOMINAL AND TRANSVAGINAL ULTRASOUND OF PELVIS  TECHNIQUE: Both transabdominal and transvaginal ultrasound examinations of the pelvis were performed. Transabdominal technique was performed for global imaging of the pelvis including uterus, ovaries, adnexal regions, and  pelvic cul-de-sac. It was necessary to proceed with endovaginal exam following the transabdominal exam to visualize the bilateral ovaries.  COMPARISON:  None  FINDINGS: Uterus  Measurements: 5.2 x 1.8 x 4.1 cm = volume: 20.1 mL. No fibroids or other mass visualized.  Endometrium  Thickness: 2 mm.  No focal abnormality visualized.  Right ovary  Measurements: 2.6 x 1.9 x 2.5 cm = volume: 6.3 mL. Normal appearance/no adnexal mass.  Left ovary  Measurements: 1.2 x 1.0 x 1.0 cm = volume: 0.6 mL. Normal appearance/no adnexal mass, noting a physiologic corpus luteum.  Other findings  No abnormal free fluid.  IMPRESSION: Negative pelvic ultrasound.   Electronically Signed   By: Julian Hy M.D.   On: 12/21/2018 09:55    Assessment     1. Pelvic pain  2. Abdominal bloating     Plan    FOLLOW UP PRN   No orders of the defined types were placed in this encounter.  No orders of the defined types were placed in this encounter.   Shelly Bombard MD 12-23-2018

## 2019-01-11 DIAGNOSIS — M899 Disorder of bone, unspecified: Secondary | ICD-10-CM | POA: Diagnosis not present

## 2019-01-11 DIAGNOSIS — M898X4 Other specified disorders of bone, hand: Secondary | ICD-10-CM | POA: Diagnosis not present

## 2019-01-11 DIAGNOSIS — M79644 Pain in right finger(s): Secondary | ICD-10-CM | POA: Diagnosis not present

## 2019-01-25 ENCOUNTER — Other Ambulatory Visit: Payer: Self-pay

## 2019-01-25 ENCOUNTER — Encounter (HOSPITAL_COMMUNITY): Payer: Self-pay

## 2019-01-25 ENCOUNTER — Telehealth (HOSPITAL_COMMUNITY): Payer: Self-pay | Admitting: Emergency Medicine

## 2019-01-25 ENCOUNTER — Ambulatory Visit: Payer: Medicare Other | Admitting: Nurse Practitioner

## 2019-01-25 ENCOUNTER — Ambulatory Visit (HOSPITAL_COMMUNITY)
Admission: EM | Admit: 2019-01-25 | Discharge: 2019-01-25 | Disposition: A | Discharge status: Home / Self Care | Payer: Medicare Other | Attending: Family Medicine | Admitting: Family Medicine

## 2019-01-25 DIAGNOSIS — M5441 Lumbago with sciatica, right side: Secondary | ICD-10-CM | POA: Diagnosis not present

## 2019-01-25 MED ORDER — PREDNISONE 50 MG PO TABS: 50.0000 mg | ORAL_TABLET | Freq: Every day | ORAL | 0 refills | Status: DC

## 2019-01-25 MED ORDER — CYCLOBENZAPRINE HCL 5 MG PO TABS: 5.0000 mg | ORAL_TABLET | Freq: Two times a day (BID) | ORAL | 0 refills | Status: DC | PRN

## 2019-01-25 MED ORDER — KETOROLAC TROMETHAMINE 60 MG/2ML IM SOLN: 60.0000 mg | Freq: Once | INTRAMUSCULAR | Status: DC

## 2019-01-25 MED ORDER — KETOROLAC TROMETHAMINE 30 MG/ML IJ SOLN
INTRAMUSCULAR | Status: AC
  Filled 2019-01-25: qty 1

## 2019-01-25 MED ORDER — KETOROLAC TROMETHAMINE 30 MG/ML IJ SOLN
30.0000 mg | Freq: Once | INTRAMUSCULAR | Status: AC
  Administered 2019-01-25: 30 mg via INTRAMUSCULAR

## 2019-01-25 MED ORDER — PREDNISONE 50 MG PO TABS: 50.0000 mg | ORAL_TABLET | Freq: Every day | ORAL | 0 refills | Status: AC

## 2019-01-25 NOTE — ED Provider Notes (Addendum)
[place]    CSN: [id number] Arrival date & time: 01/25/19  1019     History   Chief Complaint Chief Complaint  Patient presents with  . Back Pain  . Hip Pain    HPI Margaret Webster is a 58 y.o. female history of DM type II, hypertension, hypothyroidism, presenting today for evaluation of back pain.  Patient states that for the past week she has had right-sided lower back pain.  Occasionally will radiate into her hip and leg.  Notes that symptoms began after she was vacuuming at work and felt a pull in her right side.  States that she has been laying in bed since Thursday.  Denies any changes in bowel or bladder habits.  Denies saddle anesthesia.  Does endorse occasional numbness and tingling into the leg.  Feel pain wrap around side and to right abdomen.  Denies any nausea or vomiting.  Denies fevers.  Eating and drinking like normal, but does feel she gets bloated quickly.  Denies urinary symptoms of dysuria, increased frequency or hematuria.  HPI  Past Medical History:  Diagnosis Date  . Arthritis    bil shoulders  . Bronchitis   . Chronic back pain   . Diabetes mellitus    stopped Mrtformin 1 mo ago, made her "sick"  . Hypertension   . Hypothyroidism   . Thyroid disease   . Trigger thumb of right hand     Patient Active Problem List   Diagnosis Date Noted  . Prediabetes 09/08/2018  . Chest pain 01/10/2015  . Diabetes mellitus 01/10/2015  . Hypertension 01/10/2015  . Thyroid disease 01/10/2015  . Headache 01/10/2015  . Abdominal pain 01/10/2015  . Vaginitis 04/07/2014  . Pain 04/07/2014  . Vaginal irritation 04/07/2014  . Boil of buttock 04/06/2014  . BV (bacterial vaginosis) 04/06/2014  . Unspecified symptom associated with female genital organs 11/16/2013  . Candidiasis of vulva and vagina 11/16/2013  . Vaginitis and vulvovaginitis, unspecified 06/07/2013  . Pain aggravated by activities of daily living 06/07/2013  . Nonspecific abnormal finding  in stool contents 12/02/2011  . Diabetes mellitus without mention of complication 37/07/6268  . Chronic back pain     Past Surgical History:  Procedure Laterality Date  . BACK SURGERY  1993  . CARPAL TUNNEL RELEASE  08/2015  . CARPAL TUNNEL RELEASE Left 09/10/2017   Procedure: CARPAL TUNNEL RELEASE;  Surgeon: Leanora Cover, MD;  Location: [place] [facility];  Service: Orthopedics;  Laterality: Left;  . CERVICAL SPINE SURGERY Right 11/2012  . OOPHORECTOMY    . TRIGGER FINGER RELEASE Right 08/06/2017   Procedure: RIGHT THUMB TRIGGER RELEASE;  Surgeon: Leanora Cover, MD;  Location: [place];  Service: Orthopedics;  Laterality: Right;    OB History    Gravida  1   Para  0   Term  0   Preterm  0   AB  0   Living  1     SAB  0   TAB  0   Ectopic  0   Multiple  0   Live Births  1            Home Medications    Prior to Admission medications   Medication Sig Start Date End Date Taking? Authorizing Provider  cyclobenzaprine (FLEXERIL) 5 MG tablet Take 1-2 tablets (5-10 mg total) by mouth 2 (two) times daily as needed for muscle spasms. 01/25/19   Seleste Tallman C, PA-C  predniSONE (DELTASONE) 50  MG tablet Take 1 tablet (50 mg total) by mouth daily for 5 days. 01/25/19 01/30/19  Adeeb Konecny, Elesa Hacker, PA-C    Family History Family History  Problem Relation Age of Onset  . Diabetes Mother   . Cystic fibrosis Sister   . Cancer Sister        ?  Marland Kitchen Heart disease Maternal Grandmother   . Clotting disorder Maternal Grandfather     Social History Social History   Tobacco Use  . Smoking status: Current Every Day Smoker    Packs/day: 0.50    Types: Cigarettes  . Smokeless tobacco: Never Used  . Tobacco comment: 4 cig a day   Substance Use Topics  . Alcohol use: No  . Drug use: Yes     Allergies   Sulfa antibiotics; Ibuprofen; Influenza vaccines; Lipitor [atorvastatin calcium]; and Naproxen   Review of Systems Review of Systems   Constitutional: Negative for fatigue and fever.  HENT: Negative for mouth sores.   Eyes: Negative for visual disturbance.  Respiratory: Negative for shortness of breath.   Cardiovascular: Negative for chest pain.  Gastrointestinal: Negative for abdominal pain, nausea and vomiting.  Genitourinary: Negative for difficulty urinating, dysuria, hematuria, pelvic pain and urgency.  Musculoskeletal: Positive for back pain and myalgias. Negative for arthralgias and joint swelling.  Skin: Negative for color change, rash and wound.  Neurological: Negative for dizziness, weakness, light-headedness and headaches.     Physical Exam Triage Vital Signs ED Triage Vitals [01/25/19 1043]  Enc Vitals Group     BP (!) 177/77     Pulse Rate (!) 117     Resp 18     Temp 98 F (36.7 C)     Temp Source Oral     SpO2 97 %     Weight      Height      Head Circumference      Peak Flow      Pain Score 10     Pain Loc      Pain Edu?      Excl. in [place]?    No data found.  Updated Vital Signs BP (!) 177/77 (BP Location: Left Arm)   Pulse (!) 117   Temp 98 F (36.7 C) (Oral)   Resp 18   SpO2 97%   pulse rechecked- 57 Visual Acuity Right Eye Distance:   Left Eye Distance:   Bilateral Distance:    Right Eye Near:   Left Eye Near:    Bilateral Near:     Physical Exam Vitals signs and nursing note reviewed.  Constitutional:      General: She is not in acute distress.    Appearance: She is well-developed.  HENT:     Head: Normocephalic and atraumatic.  Eyes:     Conjunctiva/sclera: Conjunctivae normal.  Neck:     Musculoskeletal: Neck supple.  Cardiovascular:     Rate and Rhythm: Normal rate and regular rhythm.     Heart sounds: No murmur.  Pulmonary:     Effort: Pulmonary effort is normal. No respiratory distress.     Breath sounds: Normal breath sounds.     Comments: Breathing comfortably at rest, CTABL, no wheezing, rales or other adventitious sounds auscultated  Abdominal:      Palpations: Abdomen is soft.     Tenderness: There is no abdominal tenderness.     Comments: Mild tenderness to abdomen on right side, negative rebound, negative McBurney's, negative Murphy's  Musculoskeletal:  Comments: Nontender to palpation of thoracic and lumbar spine midline, well-healed surgical scar from previous back surgery.  Point of maximal tenderness along the lower right thoracic region, does wrap around her right flank and into left lower lumbar region Strength 5/5 and equal bilaterally, patellar reflex 2+ bilaterally Negative straight leg raise  Appears slightly uncomfortable when moving and changing positions, but no acute distress  Skin:    General: Skin is warm and dry.  Neurological:     Mental Status: She is alert.      UC Treatments / Results  Labs (all labs ordered are listed, but only abnormal results are displayed) Labs Reviewed - No data to display  Lab Results  Component Value Date   HGBA1C 5.4 09/08/2018     EKG None  Radiology No results found.  Procedures Procedures (including critical care time)  Medications Ordered in UC Medications  ketorolac (TORADOL) 30 MG/ML injection 30 mg (has no administration in time range)    Initial Impression / Assessment and Plan / UC Course  I have reviewed the triage vital signs and the nursing notes.  Pertinent labs & imaging results that were available during my care of the patient were reviewed by me and considered in my medical decision making (see chart for details).     Patient likely with muscular strain/spasm causing pain, does report radiation into leg.  Pain does go into abdomen but the point of maximal tenderness is more posterior on back.  Patient has previously done well when given Toradol and Kenalog injection.  Will repeat Toradol today, but will place on prednisone daily for the next 5 days.  Patient currently does not have any signs or symptoms of COVID-19.  Feel benefit outweighs  current risk at this time.  No neuro deficits, strength intact.  No red flags for cauda equina.  Advised against bed rest, but also advised no heavy lifting.  Continue to monitor,Discussed strict return precautions. Patient verbalized understanding and is agreeable with plan.  Final Clinical Impressions(s) / UC Diagnoses   Final diagnoses:  Acute right-sided low back pain with right-sided sciatica     Discharge Instructions     We gave you a shot of Toradol today Begin prednisone daily for next 5 days with food You may use flexeril as needed to help with pain. This is a muscle relaxer and causes sedation- please use only at bedtime or when you will be home and not have to drive/work Please avoid complete bed/couch rest, please continue to move around Follow up if not improving or worsening   ED Prescriptions    Medication Sig Dispense Auth. Provider   predniSONE (DELTASONE) 50 MG tablet Take 1 tablet (50 mg total) by mouth daily for 5 days. 5 tablet Boyde Grieco C, PA-C   cyclobenzaprine (FLEXERIL) 5 MG tablet Take 1-2 tablets (5-10 mg total) by mouth 2 (two) times daily as needed for muscle spasms. 24 tablet Lauree Yurick, [place] C, PA-C     Controlled Substance Prescriptions [place] Controlled Substance Registry consulted? Not Applicable   Janith Lima, PA-C 01/25/19 1120    [address] [place], [place] 01/25/19 1121

## 2019-01-25 NOTE — ED Triage Notes (Signed)
Pt presents with back pain and chronic hip pain since Thursday.

## 2019-01-25 NOTE — Discharge Instructions (Signed)
We gave you a shot of Toradol today Begin prednisone daily for next 5 days with food You may use flexeril as needed to help with pain. This is a muscle relaxer and causes sedation- please use only at bedtime or when you will be home and not have to drive/work Please avoid complete bed/couch rest, please continue to move around Follow up if not improving or worsening

## 2019-02-18 ENCOUNTER — Encounter (HOSPITAL_COMMUNITY): Payer: Self-pay

## 2019-02-18 ENCOUNTER — Other Ambulatory Visit: Payer: Self-pay

## 2019-02-18 ENCOUNTER — Emergency Department (HOSPITAL_COMMUNITY)
Admission: EM | Admit: 2019-02-18 | Discharge: 2019-02-18 | Disposition: A | Discharge status: Home / Self Care | Payer: Medicare Other | Attending: Emergency Medicine | Admitting: Emergency Medicine

## 2019-02-18 DIAGNOSIS — J0100 Acute maxillary sinusitis, unspecified: Secondary | ICD-10-CM | POA: Diagnosis not present

## 2019-02-18 DIAGNOSIS — E039 Hypothyroidism, unspecified: Secondary | ICD-10-CM | POA: Diagnosis not present

## 2019-02-18 DIAGNOSIS — F17210 Nicotine dependence, cigarettes, uncomplicated: Secondary | ICD-10-CM | POA: Insufficient documentation

## 2019-02-18 DIAGNOSIS — E119 Type 2 diabetes mellitus without complications: Secondary | ICD-10-CM | POA: Insufficient documentation

## 2019-02-18 DIAGNOSIS — I10 Essential (primary) hypertension: Secondary | ICD-10-CM | POA: Diagnosis not present

## 2019-02-18 DIAGNOSIS — H9203 Otalgia, bilateral: Secondary | ICD-10-CM | POA: Diagnosis present

## 2019-02-18 MED ORDER — AMOXICILLIN-POT CLAVULANATE 875-125 MG PO TABS: 1.0000 | ORAL_TABLET | Freq: Two times a day (BID) | ORAL | 0 refills | Status: DC

## 2019-02-18 NOTE — ED Provider Notes (Signed)
[facility] DEPARTMENT Provider Note   CSN: [id number] Arrival date & time: 02/18/19  1954    History   Chief Complaint Chief Complaint  Patient presents with  . Otalgia  . Oral Pain    HPI Margaret Webster is a 58 y.o. female with a hx of arthritis, bronchitis, chronic back pain, NIDDM, HTN, thyroid disease presents to the Emergency Department complaining of gradual, persistent, progressively worsening frontal headache, upper dental pain, bilateral otalgia and sore throat onset 3-4 day ago.  Pt reports purulent drainage from her nose and post-nasal drip.  Patient attempted to clean her ears without relief.  No specific aggravating or alleviating factors.  She denies vision changes, fevers, chills, changes in her voice, nosebleeds, chest pain, cough, shortness of breath, domino pain, nausea, vomiting, diarrhea, weakness, neck pain, neck stiffness.     The history is provided by the patient and medical records. No language interpreter was used.    Past Medical History:  Diagnosis Date  . Arthritis    bil shoulders  . Bronchitis   . Chronic back pain   . Diabetes mellitus    stopped Mrtformin 1 mo ago, made her "sick"  . Hypertension   . Hypothyroidism   . Thyroid disease   . Trigger thumb of right hand     Patient Active Problem List   Diagnosis Date Noted  . Prediabetes 09/08/2018  . Chest pain 01/10/2015  . Diabetes mellitus 01/10/2015  . Hypertension 01/10/2015  . Thyroid disease 01/10/2015  . Headache 01/10/2015  . Abdominal pain 01/10/2015  . Vaginitis 04/07/2014  . Pain 04/07/2014  . Vaginal irritation 04/07/2014  . Boil of buttock 04/06/2014  . BV (bacterial vaginosis) 04/06/2014  . Unspecified symptom associated with female genital organs 11/16/2013  . Candidiasis of vulva and vagina 11/16/2013  . Vaginitis and vulvovaginitis, unspecified 06/07/2013  . Pain aggravated by activities of daily living 06/07/2013  . Nonspecific  abnormal finding in stool contents 12/02/2011  . Diabetes mellitus without mention of complication [id number]/23/[id number]  . Chronic back pain     Past Surgical History:  Procedure Laterality Date  . BACK SURGERY  1993  . CARPAL TUNNEL RELEASE  08/2015  . CARPAL TUNNEL RELEASE Left 09/10/2017   Procedure: CARPAL TUNNEL RELEASE;  Surgeon: Leanora Cover, MD;  Location: [facility];  Service: Orthopedics;  Laterality: Left;  . CERVICAL SPINE SURGERY Right 11/2012  . OOPHORECTOMY    . TRIGGER FINGER RELEASE Right 08/06/2017   Procedure: RIGHT THUMB TRIGGER RELEASE;  Surgeon: Leanora Cover, MD;  Location: [facility];  Service: Orthopedics;  Laterality: Right;     OB History    Gravida  1   Para  0   Term  0   Preterm  0   AB  0   Living  1     SAB  0   TAB  0   Ectopic  0   Multiple  0   Live Births  1            Home Medications    Prior to Admission medications   Medication Sig Start Date End Date Taking? Authorizing Provider  amoxicillin-clavulanate (AUGMENTIN) 875-125 MG tablet Take 1 tablet by mouth 2 (two) times daily. One po bid x 7 days 02/18/19   Gwenetta Devos, Jarrett Soho, PA-C  cyclobenzaprine (FLEXERIL) 5 MG tablet Take 1-2 tablets (5-10 mg total) by mouth 2 (two) times daily as needed for muscle spasms. 01/25/19  Wieters, [place] C, PA-C    Family History Family History  Problem Relation Age of Onset  . Diabetes Mother   . Cystic fibrosis Sister   . Cancer Sister        ?  Marland Kitchen Heart disease Maternal Grandmother   . Clotting disorder Maternal Grandfather     Social History Social History   Tobacco Use  . Smoking status: Current Every Day Smoker    Packs/day: 0.50    Types: Cigarettes  . Smokeless tobacco: Never Used  . Tobacco comment: 4 cig a day   Substance Use Topics  . Alcohol use: No  . Drug use: Yes     Allergies   Sulfa antibiotics; Ibuprofen; Influenza vaccines; Lipitor [atorvastatin calcium]; and Naproxen    Review of Systems Review of Systems  Constitutional: Negative for fatigue and fever.  HENT: Positive for dental problem, ear pain, postnasal drip, rhinorrhea, sinus pressure and sinus pain. Negative for trouble swallowing and voice change.   Eyes: Negative for pain and visual disturbance.  Respiratory: Negative for cough, chest tightness and shortness of breath.   Cardiovascular: Negative for chest pain.  Gastrointestinal: Negative for abdominal pain, nausea and vomiting.  Musculoskeletal: Negative for neck pain and neck stiffness.  Skin: Negative for rash.  Allergic/Immunologic: Negative for immunocompromised state.  Neurological: Negative for syncope.  Hematological: Positive for adenopathy.  Psychiatric/Behavioral: The patient is not nervous/anxious.      Physical Exam Updated Vital Signs BP (!) 154/73 (BP Location: Right Arm)   Pulse 68   Temp 98.2 F (36.8 C) (Oral)   Resp 17   SpO2 96%   Physical Exam Vitals signs and nursing note reviewed.  Constitutional:      General: She is not in acute distress.    Appearance: She is well-developed. She is not diaphoretic.  HENT:     Head: Normocephalic and atraumatic.     Right Ear: Ear canal and external ear normal. A middle ear effusion (hazy) is present. Tympanic membrane is erythematous and bulging.     Left Ear: Ear canal and external ear normal. A middle ear effusion ( clear) is present. Tympanic membrane is erythematous. Tympanic membrane is not bulging.     Nose: Mucosal edema present.     Right Turbinates: Enlarged.     Left Turbinates: Enlarged.     Right Sinus: Maxillary sinus tenderness and frontal sinus tenderness present.     Left Sinus: Maxillary sinus tenderness and frontal sinus tenderness present.     Mouth/Throat:     Mouth: Mucous membranes are not pale and not cyanotic.     Pharynx: Uvula midline. No oropharyngeal exudate or posterior oropharyngeal erythema.     Tonsils: No tonsillar abscesses.      Comments: Dentition throughout.  No erythematous gingiva or gross abscesses.  No localized dental pain on palpation. Postnasal drip visible. Eyes:     Conjunctiva/sclera: Conjunctivae normal.     Pupils: Pupils are equal, round, and reactive to light.  Neck:     Musculoskeletal: Full passive range of motion without pain and normal range of motion.  Cardiovascular:     Rate and Rhythm: Normal rate.  Pulmonary:     Effort: Pulmonary effort is normal.     Breath sounds: Normal breath sounds. No stridor.  Abdominal:     Palpations: Abdomen is soft.     Tenderness: There is no abdominal tenderness.  Musculoskeletal: Normal range of motion.  Lymphadenopathy:     Cervical: No cervical  adenopathy.  Skin:    General: Skin is warm and dry.     Findings: No rash.  Neurological:     Mental Status: She is alert.      ED Treatments / Results   Procedures Procedures (including critical care time)  Medications Ordered in ED Medications - No data to display   Initial Impression / Assessment and Plan / ED Course  I have reviewed the triage vital signs and the nursing notes.  Pertinent labs & imaging results that were available during my care of the patient were reviewed by me and considered in my medical decision making (see chart for details).         Margaret Webster was evaluated in Emergency Department on 02/18/2019 for the symptoms described in the history of present illness. She was evaluated in the context of the global COVID-19 pandemic, which necessitated consideration that the patient might be at risk for infection with the SARS-CoV-2 virus that causes COVID-19. Institutional protocols and algorithms that pertain to the evaluation of patients at risk for COVID-19 are in a state of rapid change based on information released by regulatory bodies including the CDC and federal and state organizations. These policies and algorithms were followed during the patient's care in the ED.   Patient complaining of symptoms of sinusitis.  Symptoms have been present for less than 10 days however, with purulent nasal discharge and maxillary sinus pain.  Concern for acute bacterial rhinosinusitis.  Less likely COVID at this time. Patient discharged with Augmentin.  Instructions given for warm saline nasal wash and recommendations for follow-up with primary care physician.      Final Clinical Impressions(s) / ED Diagnoses   Final diagnoses:  Acute maxillary sinusitis, recurrence not specified    ED Discharge Orders         Ordered    amoxicillin-clavulanate (AUGMENTIN) 875-125 MG tablet  2 times daily,   Status:  Discontinued     02/18/19 2335            Devine Klingel, Jarrett Soho, PA-C 02/18/19 2345    Tegeler, Gwenyth Allegra, MD 02/18/19 2351

## 2019-02-18 NOTE — Discharge Instructions (Signed)
1. Medications: Augmentin, usual home medications 2. Treatment: rest, drink plenty of fluids,  3. Follow Up: Please followup with your primary doctor in 3-5 days for discussion of your diagnoses and further evaluation after today's visit; if you do not have a primary care doctor use the resource guide provided to find one; Please return to the ER for worsening pain, fevers, difficulty breathing, vision changes or any other concerns.

## 2019-02-18 NOTE — ED Triage Notes (Signed)
Pt here with bilateral ear pain and pain behind the lips into the throat. No difficulty swallowing.  A&ox4.

## 2019-02-21 ENCOUNTER — Telehealth: Payer: Self-pay | Admitting: Nurse Practitioner

## 2019-02-21 NOTE — Telephone Encounter (Signed)
Spk with patient and she has agreed to a virtual visit with [name] on 02/22/19

## 2019-02-22 ENCOUNTER — Other Ambulatory Visit: Payer: Self-pay

## 2019-02-22 ENCOUNTER — Ambulatory Visit (INDEPENDENT_AMBULATORY_CARE_PROVIDER_SITE_OTHER): Payer: Medicare Other | Admitting: Nurse Practitioner

## 2019-02-22 DIAGNOSIS — G8929 Other chronic pain: Secondary | ICD-10-CM | POA: Diagnosis not present

## 2019-02-22 DIAGNOSIS — R7303 Prediabetes: Secondary | ICD-10-CM | POA: Diagnosis not present

## 2019-02-22 DIAGNOSIS — M545 Low back pain, unspecified: Secondary | ICD-10-CM

## 2019-02-22 DIAGNOSIS — J019 Acute sinusitis, unspecified: Secondary | ICD-10-CM | POA: Diagnosis not present

## 2019-02-22 DIAGNOSIS — I10 Essential (primary) hypertension: Secondary | ICD-10-CM | POA: Diagnosis not present

## 2019-02-22 NOTE — Progress Notes (Signed)
Virtual Visit via Video   This visit type was conducted due to national recommendations for restrictions regarding the COVID-19 Pandemic (e.g. social distancing) in an effort to limit this patient's exposure and mitigate transmission in our community.  Patients identity confirmed using two different identifiers.  This format is felt to be most appropriate for this patient at this time.  All issues noted in this document were discussed and addressed.  No physical exam was performed (except for noted visual exam findings with Video Visits).    Date:  03/21/2019   ID:  Margaret Webster, DOB 10-07-1961, MRN [id number]  Patient Location:  [facility]  Provider location:   Office    Chief Complaint:    History of Present Illness:    Margaret Webster is a 58 y.o. female who presents via video conferencing for a telehealth visit today.    The patient does not have symptoms concerning for COVID-19 infection (fever, chills, cough, or new shortness of breath).   Seasonal allergies - she went to ER for sinus pressure, ear pain bilaterally.  She was diagnosed with sinus infection. She was having tooth pain a well  She had head pain between her eyes.  Augmentin given she has been taking since Saturday Hypertension  This is a chronic problem. The current episode started more than 1 year ago. The problem is unchanged. Condition status: no blood pressure this visit due to being virtual. There are no associated agents to hypertension. Risk factors for coronary artery disease include sedentary lifestyle. There are no compliance problems.  There is no history of chronic renal disease.  Diabetes  She presents for her follow-up diabetic visit. Diabetes type: prediabetes. Her disease course has been stable. There are no hypoglycemic associated symptoms. There are no diabetic associated symptoms. There are no hypoglycemic complications. Risk factors for coronary artery disease include sedentary  lifestyle. Current diabetic treatment includes diet. She is following a generally healthy diet. She rarely participates in exercise.     Past Medical History:  Diagnosis Date  . Arthritis    bil shoulders  . Bronchitis   . Chronic back pain   . Diabetes mellitus    stopped Mrtformin 1 mo ago, made her "sick"  . Hypertension   . Hypothyroidism   . Thyroid disease   . Trigger thumb of right hand    Past Surgical History:  Procedure Laterality Date  . BACK SURGERY  1993  . CARPAL TUNNEL RELEASE  08/2015  . CARPAL TUNNEL RELEASE Left 09/10/2017   Procedure: CARPAL TUNNEL RELEASE;  Surgeon: Leanora Cover, MD;  Location: [facility];  Service: Orthopedics;  Laterality: Left;  . CERVICAL SPINE SURGERY Right 11/2012  . OOPHORECTOMY    . TRIGGER FINGER RELEASE Right 08/06/2017   Procedure: RIGHT THUMB TRIGGER RELEASE;  Surgeon: Leanora Cover, MD;  Location: [facility];  Service: Orthopedics;  Laterality: Right;     Current Meds  Medication Sig  . amoxicillin-clavulanate (AUGMENTIN) 875-125 MG tablet Take 1 tablet by mouth 2 (two) times daily. One po bid x 7 days     Allergies:   Sulfa antibiotics; Ibuprofen; Influenza vaccines; Lipitor [atorvastatin calcium]; and Naproxen   Social History   Tobacco Use  . Smoking status: Current Every Day Smoker    Packs/day: 0.50    Types: Cigarettes  . Smokeless tobacco: Never Used  . Tobacco comment: 4 cig a day   Substance Use Topics  . Alcohol use:  No  . Drug use: Yes     Family Hx: The patient's family history includes Cancer in her sister; Clotting disorder in her maternal grandfather; Cystic fibrosis in her sister; Diabetes in her mother; Heart disease in her maternal grandmother.  ROS:   Please see the history of present illness.    ROS  All other systems reviewed and are negative.   Labs/Other Tests and Data Reviewed:    Recent Labs: 05/06/2018: ALT 14; Hemoglobin 15.3; Platelets 310 09/08/2018:  TSH 4.430 02/23/2019: BUN 18; Creatinine, Ser 1.17; Potassium 5.2; Sodium 141   Recent Lipid Panel No results found for: CHOL, TRIG, HDL, CHOLHDL, LDLCALC, LDLDIRECT  Wt Readings from Last 3 Encounters:  12/23/18 197 lb (89.4 kg)  12/09/18 194 lb (88 kg)  11/11/18 197 lb 12.8 oz (89.7 kg)     Exam:    Vital Signs:  There were no vitals taken for this visit.    Physical Exam  Constitutional: She is oriented to person, place, and time and well-developed, well-nourished, and in no distress.  Eyes: Conjunctivae and EOM are normal. Right eye exhibits no discharge. Left eye exhibits no discharge.  Neurological: She is alert and oriented to person, place, and time.  Psychiatric: Mood, memory, affect and judgment normal.    ASSESSMENT & PLAN:   1. Prediabetes  Chronic  No current medications did not tolerate metformin in the past - BMP8+eGFR; Future - Hemoglobin A1c; Future  2. Essential hypertension . No blood pressure this visit due to virtual   . BMP ordered to check renal function.  . The importance of regular exercise and dietary modification was stressed to the patient.  . Stressed importance of losing ten percent of her body weight to help with B/P control.   3. Chronic bilateral low back pain without sciatica  Ongoing back pain and would like a referral to orthopedics for further evaluation  Encouraged to stretch regularly - Ambulatory referral to Orthopedic Surgery  4. Acute non-recurrent sinusitis, unspecified location  Treated in ER and continues to take augmentin  COVID-19 Education: The signs and symptoms of COVID-19 were discussed with the patient and how to seek care for testing (follow up with PCP or arrange E-visit).  The importance of social distancing was discussed today.  Patient Risk:   After full review of this patients clinical status, I feel that they are at least moderate risk at this time.  Time:   Today, I have spent 13 minutes/ seconds with  the patient with telehealth technology discussing above diagnoses.     Medication Adjustments/Labs and Tests Ordered: Current medicines are reviewed at length with the patient today.  Concerns regarding medicines are outlined above.   Tests Ordered: Orders Placed This Encounter  Procedures  . BMP8+eGFR  . Hemoglobin A1c  . Ambulatory referral to Orthopedic Surgery    Medication Changes: No orders of the defined types were placed in this encounter.   Disposition:  Follow up in 3 month(s)  Signed, [name], FNP

## 2019-02-23 ENCOUNTER — Encounter: Payer: Self-pay | Admitting: Nurse Practitioner

## 2019-02-23 ENCOUNTER — Other Ambulatory Visit: Payer: Self-pay

## 2019-02-23 ENCOUNTER — Other Ambulatory Visit: Payer: Medicare Other

## 2019-02-23 DIAGNOSIS — R7303 Prediabetes: Secondary | ICD-10-CM | POA: Diagnosis not present

## 2019-02-24 LAB — BMP8+EGFR
BUN/Creatinine Ratio: 15 (ref 9–23)
BUN: 18 mg/dL (ref 6–24)
CO2: 19 mmol/L — ABNORMAL LOW (ref 20–29)
Calcium: 9.9 mg/dL (ref 8.7–10.2)
Chloride: 103 mmol/L (ref 96–106)
Creatinine, Ser: 1.17 mg/dL — ABNORMAL HIGH (ref 0.57–1.00)
GFR calc Af Amer: 59 mL/min/{1.73_m2} — ABNORMAL LOW (ref >59)
GFR calc non Af Amer: 51 mL/min/{1.73_m2} — ABNORMAL LOW (ref >59)
Glucose: 100 mg/dL — ABNORMAL HIGH (ref 65–99)
Potassium: 5.2 mmol/L (ref 3.5–5.2)
Sodium: 141 mmol/L (ref 134–144)

## 2019-02-24 LAB — HEMOGLOBIN A1C
Est. average glucose Bld gHb Est-mCnc: 120 mg/dL
Hgb A1c MFr Bld: 5.8 % — ABNORMAL HIGH (ref 4.8–5.6)

## 2019-03-08 ENCOUNTER — Telehealth: Payer: Self-pay

## 2019-03-08 NOTE — Telephone Encounter (Signed)
Your HgbA1c is slightly up to 5.8 this is prediabetic it was 5.4. Limit your intake of sugary foods and drinks. Kidney functions are slightly worse so we really keep your HgbA1c down below 5.6. And keep your blood pressure controlled. If you are taking NSAIDs take Tylenol only. This can cause damage to your kidneys.    PATIENT NOTIFIED OF HER LABS AND I HAVE CALLED HER TO GET THE MG OF THE MEDICINES ON THE LIST SHE PROVIDED [place] WITH BUT SHE SAID SHE DOESN'T HAVE THOSE MEDICINES SHE HAS NOT TAKEN ANY MEDS FOR BLOOD PRESSURE OR HER THYROID IN AT LEAST A YEAR. YRL,RMA

## 2019-03-09 ENCOUNTER — Ambulatory Visit: Payer: Medicare Other | Admitting: Nurse Practitioner

## 2019-03-21 ENCOUNTER — Encounter: Payer: Self-pay | Admitting: Nurse Practitioner

## 2019-03-23 DIAGNOSIS — M79641 Pain in right hand: Secondary | ICD-10-CM | POA: Diagnosis not present

## 2019-03-23 DIAGNOSIS — S63650A Sprain of metacarpophalangeal joint of right index finger, initial encounter: Secondary | ICD-10-CM | POA: Diagnosis not present

## 2019-03-25 DIAGNOSIS — M79644 Pain in right finger(s): Secondary | ICD-10-CM | POA: Diagnosis not present

## 2019-04-06 DIAGNOSIS — S63650A Sprain of metacarpophalangeal joint of right index finger, initial encounter: Secondary | ICD-10-CM | POA: Diagnosis not present

## 2019-04-20 DIAGNOSIS — S63650A Sprain of metacarpophalangeal joint of right index finger, initial encounter: Secondary | ICD-10-CM | POA: Diagnosis not present

## 2019-04-27 ENCOUNTER — Ambulatory Visit: Payer: Medicare Other | Admitting: Nurse Practitioner

## 2019-05-03 ENCOUNTER — Encounter: Payer: Self-pay | Admitting: Nurse Practitioner

## 2019-05-03 ENCOUNTER — Other Ambulatory Visit: Payer: Self-pay

## 2019-05-03 ENCOUNTER — Ambulatory Visit (INDEPENDENT_AMBULATORY_CARE_PROVIDER_SITE_OTHER): Payer: Medicare Other | Admitting: Nurse Practitioner

## 2019-05-03 VITALS — BP 136/68 | HR 62 | Temp 98.3°F | Ht 66.6 in | Wt 207.0 lb

## 2019-05-03 DIAGNOSIS — M1991 Primary osteoarthritis, unspecified site: Secondary | ICD-10-CM

## 2019-05-03 DIAGNOSIS — Z716 Tobacco abuse counseling: Secondary | ICD-10-CM

## 2019-05-03 DIAGNOSIS — G8929 Other chronic pain: Secondary | ICD-10-CM

## 2019-05-03 DIAGNOSIS — M25551 Pain in right hip: Secondary | ICD-10-CM

## 2019-05-03 DIAGNOSIS — M25512 Pain in left shoulder: Secondary | ICD-10-CM | POA: Diagnosis not present

## 2019-05-03 DIAGNOSIS — M25511 Pain in right shoulder: Secondary | ICD-10-CM | POA: Diagnosis not present

## 2019-05-03 DIAGNOSIS — J301 Allergic rhinitis due to pollen: Secondary | ICD-10-CM

## 2019-05-03 MED ORDER — DICLOFENAC SODIUM 1 % TD GEL: 2.0000 g | Freq: Four times a day (QID) | TRANSDERMAL | 2 refills | Status: DC

## 2019-05-03 MED ORDER — MOMETASONE FUROATE 50 MCG/ACT NA SUSP
2.0000 | Freq: Every day | NASAL | 2 refills | Status: DC
Start: 2019-05-03 — End: 2020-01-25

## 2019-05-03 MED ORDER — NICOTINE 21 MG/24HR TD PT24: 21.0000 mg | MEDICATED_PATCH | TRANSDERMAL | 1 refills | Status: DC

## 2019-05-03 NOTE — Progress Notes (Signed)
Subjective:     Patient ID: Margaret Webster , female    DOB: 07-02-1961 , 58 y.o.   MRN: [id number]   Chief Complaint  Patient presents with  . Arthritis    hip knee shoulder    HPI  Arthritis Presents for follow-up visit. She complains of pain (right hip). She reports no joint swelling. Affected location: right hip and bilateral shoulders. Pertinent negatives include no diarrhea or fatigue. Side effects of treatment include joint pain.     Past Medical History:  Diagnosis Date  . Arthritis    bil shoulders  . Bronchitis   . Chronic back pain   . Diabetes mellitus    stopped Mrtformin 1 mo ago, made her "sick"  . Hypertension   . Hypothyroidism   . Thyroid disease   . Trigger thumb of right hand      Family History  Problem Relation Age of Onset  . Diabetes Mother   . Cystic fibrosis Sister   . Cancer Sister        ?  Marland Kitchen Heart disease Maternal Grandmother   . Clotting disorder Maternal Grandfather      Current Outpatient Medications:  .  amoxicillin-clavulanate (AUGMENTIN) 875-125 MG tablet, Take 1 tablet by mouth 2 (two) times daily. One po bid x 7 days (Patient not taking: Reported on 05/03/2019), Disp: 14 tablet, Rfl: 0 .  cyclobenzaprine (FLEXERIL) 5 MG tablet, Take 1-2 tablets (5-10 mg total) by mouth 2 (two) times daily as needed for muscle spasms. (Patient not taking: Reported on 02/22/2019), Disp: 24 tablet, Rfl: 0   Allergies  Allergen Reactions  . Sulfa Antibiotics Other (See Comments)    Hard to breathe  . Ibuprofen     Stomach upset  . Influenza Vaccines Hives  . Lipitor [Atorvastatin Calcium] Other (See Comments)    Muscle ache   . Naproxen Nausea And Vomiting     Review of Systems  Constitutional: Negative for fatigue.  Gastrointestinal: Negative for diarrhea.  Musculoskeletal: Positive for arthritis. Negative for joint swelling.     Today's Vitals   05/03/19 0901  BP: 136/68  Pulse: 62  Temp: 98.3 F (36.8 C)  TempSrc: Oral  SpO2: 98%   Weight: 207 lb (93.9 kg)  Height: 5' 6.6" (1.692 m)   Body mass index is 32.81 kg/m.   Objective:  Physical Exam Constitutional:      Appearance: Normal appearance.  Cardiovascular:     Rate and Rhythm: Normal rate and regular rhythm.     Pulses: Normal pulses.     Heart sounds: Normal heart sounds. No murmur.  Pulmonary:     Effort: Pulmonary effort is normal. No respiratory distress.     Breath sounds: Normal breath sounds.  Musculoskeletal:        General: Tenderness (right hip) present.  Skin:    General: Skin is warm and dry.     Capillary Refill: Capillary refill takes less than 2 seconds.  Neurological:     General: No focal deficit present.     Mental Status: She is alert and oriented to person, place, and time.  Psychiatric:        Mood and Affect: Mood normal.        Behavior: Behavior normal.        Thought Content: Thought content normal.        Judgment: Judgment normal.         Assessment And Plan:     1. Primary osteoarthritis, unspecified  site  Right hip and shoulder  Pain with range of motion - diclofenac sodium (VOLTAREN) 1 % GEL; Apply 2 g topically 4 (four) times daily.  Dispense: 100 g; Refill: 2  2. Right hip pain  She has not done physical therapy for her pain  Will consider this if not better with voltaren gel - diclofenac sodium (VOLTAREN) 1 % GEL; Apply 2 g topically 4 (four) times daily.  Dispense: 100 g; Refill: 2  3. Chronic pain of both shoulders  - diclofenac sodium (VOLTAREN) 1 % GEL; Apply 2 g topically 4 (four) times daily.  Dispense: 100 g; Refill: 2  4. Non-seasonal allergic rhinitis due to pollen  Take nasonex daily during peak season - mometasone (NASONEX) 50 MCG/ACT nasal spray; Place 2 sprays into the nose daily.  Dispense: 17 g; Refill: 2  5. Encounter for tobacco use cessation counseling Ready to quit: Yes Counseling given: Yes Comment: 4 cig a day  Smoking cessation instruction/counseling given:  counseled  patient on the dangers of tobacco use, advised patient to stop smoking, and reviewed strategies to maximize success - nicotine (NICODERM CQ - DOSED IN MG/24 HOURS) 21 mg/24hr patch; Place 1 patch (21 mg total) onto the skin daily.  Dispense: 30 patch; Refill: 1   [name], FNP    THE PATIENT IS ENCOURAGED TO PRACTICE SOCIAL DISTANCING DUE TO THE COVID-19 PANDEMIC.

## 2019-05-03 NOTE — Patient Instructions (Signed)
Exercises To Do While Sitting  Exercises that you do while sitting (chair exercises) can give you many of the same benefits as full exercise. Benefits include strengthening your heart, burning calories, and keeping muscles and joints healthy. Exercise can also improve your mood and help with depression and anxiety. You may benefit from chair exercises if you are unable to do standing exercises because of:  Diabetic foot pain.  Obesity.  Illness.  Arthritis.  Recovery from surgery or injury.  Breathing problems.  Balance problems.  Another type of disability. Before starting chair exercises, check with your health care provider or a physical therapist to find out how much exercise you can tolerate and which exercises are safe for you. If your health care provider approves:  Start out slowly and build up over time. Aim to work up to about 10-20 minutes for each exercise session.  Make exercise part of your daily routine.  Drink water when you exercise. Do not wait until you are thirsty. Drink every 10-15 minutes.  Stop exercising right away if you have pain, nausea, shortness of breath, or dizziness.  If you are exercising in a wheelchair, make sure to lock the wheels.  Ask your health care provider whether you can do tai chi or yoga. Many positions in these mind-body exercises can be modified to do while seated. Warm-up Before starting other exercises: 1. Sit up as straight as you can. Have your knees bent at 90 degrees, which is the shape of the capital letter "L." Keep your feet flat on the floor. 2. Sit at the front edge of your chair, if you can. 3. Pull in (tighten) the muscles in your abdomen and stretch your spine and neck as straight as you can. Hold this position for a few minutes. 4. Breathe in and out evenly. Try to concentrate on your breathing, and relax your mind. Stretching Exercise A: Arm stretch 1. Hold your arms out straight in front of your body. 2. Bend  your hands at the wrist with your fingers pointing up, as if signaling someone to stop. Notice the slight tension in your forearms as you hold the position. 3. Keeping your arms out and your hands bent, rotate your hands outward as far as you can and hold this stretch. Aim to have your thumbs pointing up and your pinkie fingers pointing down. Slowly repeat arm stretches for one minute as tolerated. Exercise B: Leg stretch 1. If you can move your legs, try to "draw" letters on the floor with the toes of your foot. Write your name with one foot. 2. Write your name with the toes of your other foot. Slowly repeat the movements for one minute as tolerated. Exercise C: Reach for the sky 1. Reach your hands as far over your head as you can to stretch your spine. 2. Move your hands and arms as if you are climbing a rope. Slowly repeat the movements for one minute as tolerated. Range of motion exercises Exercise A: Shoulder roll 1. Let your arms hang loosely at your sides. 2. Lift just your shoulders up toward your ears, then let them relax back down. 3. When your shoulders feel loose, rotate your shoulders in backward and forward circles. Do shoulder rolls slowly for one minute as tolerated. Exercise B: March in place 1. As if you are marching, pump your arms and lift your legs up and down. Lift your knees as high as you can. ? If you are unable to lift your knees,  just pump your arms and move your ankles and feet up and down. March in place for one minute as tolerated. Exercise C: Seated jumping jacks 1. Let your arms hang down straight. 2. Keeping your arms straight, lift them up over your head. Aim to point your fingers to the ceiling. 3. While you lift your arms, straighten your legs and slide your heels along the floor to your sides, as wide as you can. 4. As you bring your arms back down to your sides, slide your legs back together. ? If you are unable to use your legs, just move your arms.  Slowly repeat seated jumping jacks for one minute as tolerated. Strengthening exercises Exercise A: Shoulder squeeze 1. Hold your arms straight out from your body to your sides, with your elbows bent and your fists pointed at the ceiling. 2. Keeping your arms in the bent position, move them forward so your elbows and forearms meet in front of your face. 3. Open your arms back out as wide as you can with your elbows still bent, until you feel your shoulder blades squeezing together. Hold for 5 seconds. Slowly repeat the movements forward and backward for one minute as tolerated. Contact a health care provider if you:  Had to stop exercising due to any of the following: ? Pain. ? Nausea. ? Shortness of breath. ? Dizziness. ? Fatigue.  Have significant pain or soreness after exercising. Get help right away if you have:  Chest pain.  Difficulty breathing. These symptoms may represent a serious problem that is an emergency. Do not wait to see if the symptoms will go away. Get medical help right away. Call your local emergency services (911 in the [place] Do not drive yourself to the hospital. This information is not intended to replace advice given to you by your health care provider. Make sure you discuss any questions you have with your health care provider. Document Released: 08/26/2017 Document Revised: 02/03/2019 Document Reviewed: 08/26/2017 Elsevier Patient Education  2020 [employer].

## 2019-05-20 DIAGNOSIS — S63650A Sprain of metacarpophalangeal joint of right index finger, initial encounter: Secondary | ICD-10-CM | POA: Diagnosis not present

## 2019-06-04 ENCOUNTER — Other Ambulatory Visit: Payer: Self-pay

## 2019-06-04 ENCOUNTER — Encounter (HOSPITAL_COMMUNITY): Payer: Self-pay | Admitting: Emergency Medicine

## 2019-06-04 ENCOUNTER — Emergency Department (HOSPITAL_COMMUNITY)
Admission: EM | Admit: 2019-06-04 | Discharge: 2019-06-04 | Disposition: A | Discharge status: Home / Self Care | Payer: Medicare Other | Attending: Emergency Medicine | Admitting: Emergency Medicine

## 2019-06-04 DIAGNOSIS — H1033 Unspecified acute conjunctivitis, bilateral: Secondary | ICD-10-CM | POA: Insufficient documentation

## 2019-06-04 DIAGNOSIS — Z79899 Other long term (current) drug therapy: Secondary | ICD-10-CM | POA: Diagnosis not present

## 2019-06-04 DIAGNOSIS — I10 Essential (primary) hypertension: Secondary | ICD-10-CM | POA: Insufficient documentation

## 2019-06-04 DIAGNOSIS — E119 Type 2 diabetes mellitus without complications: Secondary | ICD-10-CM | POA: Diagnosis not present

## 2019-06-04 DIAGNOSIS — F17210 Nicotine dependence, cigarettes, uncomplicated: Secondary | ICD-10-CM | POA: Insufficient documentation

## 2019-06-04 DIAGNOSIS — R0981 Nasal congestion: Secondary | ICD-10-CM | POA: Diagnosis not present

## 2019-06-04 DIAGNOSIS — B9689 Other specified bacterial agents as the cause of diseases classified elsewhere: Secondary | ICD-10-CM | POA: Diagnosis not present

## 2019-06-04 DIAGNOSIS — E039 Hypothyroidism, unspecified: Secondary | ICD-10-CM | POA: Diagnosis not present

## 2019-06-04 DIAGNOSIS — H10023 Other mucopurulent conjunctivitis, bilateral: Secondary | ICD-10-CM | POA: Diagnosis present

## 2019-06-04 MED ORDER — AMOXICILLIN-POT CLAVULANATE 875-125 MG PO TABS: 1.0000 | ORAL_TABLET | Freq: Two times a day (BID) | ORAL | 0 refills | Status: DC

## 2019-06-04 MED ORDER — POLYMYXIN B-TRIMETHOPRIM 10000-0.1 UNIT/ML-% OP SOLN: 2.0000 [drp] | OPHTHALMIC | 0 refills | Status: DC

## 2019-06-04 NOTE — ED Provider Notes (Signed)
[facility] EMERGENCY DEPARTMENT Provider Note   CSN: [id number] Arrival date & time: 06/04/19  2992     History   Chief Complaint Chief Complaint  Patient presents with  . Eye Pain  . Facial Pain  . Otalgia    HPI Margaret Webster is a 58 y.o. female.     HPI Patient presents to the emergency department with sinus pressure that started 3 days ago.  The patient states that she had some drainage from both eyes.  Patient states that nothing seems make the condition better or worse.  Patient states she has had no fever or cough.  The patient denies chest pain, shortness of breath, headache,blurred vision, neck pain, fever, cough, weakness, numbness, dizziness, anorexia, edema, abdominal pain, nausea, vomiting, diarrhea, rash, back pain, dysuria, hematemesis, bloody stool, near syncope, or syncope. Past Medical History:  Diagnosis Date  . Arthritis    bil shoulders  . Bronchitis   . Chronic back pain   . Diabetes mellitus    stopped Mrtformin 1 mo ago, made her "sick"  . Hypertension   . Hypothyroidism   . Thyroid disease   . Trigger thumb of right hand     Patient Active Problem List   Diagnosis Date Noted  . Prediabetes 09/08/2018  . Chest pain 01/10/2015  . Diabetes mellitus 01/10/2015  . Hypertension 01/10/2015  . Thyroid disease 01/10/2015  . Headache 01/10/2015  . Abdominal pain 01/10/2015  . Vaginitis 04/07/2014  . Pain 04/07/2014  . Vaginal irritation 04/07/2014  . Boil of buttock 04/06/2014  . BV (bacterial vaginosis) 04/06/2014  . Unspecified symptom associated with female genital organs 11/16/2013  . Candidiasis of vulva and vagina 11/16/2013  . Vaginitis and vulvovaginitis, unspecified 06/07/2013  . Pain aggravated by activities of daily living 06/07/2013  . Nonspecific abnormal finding in stool contents 12/02/2011  . Diabetes mellitus without mention of complication 42/68/3419  . Chronic back pain     Past Surgical History:   Procedure Laterality Date  . BACK SURGERY  1993  . CARPAL TUNNEL RELEASE  08/2015  . CARPAL TUNNEL RELEASE Left 09/10/2017   Procedure: CARPAL TUNNEL RELEASE;  Surgeon: Leanora Cover, MD;  Location: [facility];  Service: Orthopedics;  Laterality: Left;  . CERVICAL SPINE SURGERY Right 11/2012  . OOPHORECTOMY    . TRIGGER FINGER RELEASE Right 08/06/2017   Procedure: RIGHT THUMB TRIGGER RELEASE;  Surgeon: Leanora Cover, MD;  Location: [facility];  Service: Orthopedics;  Laterality: Right;     OB History    Gravida  1   Para  0   Term  0   Preterm  0   AB  0   Living  1     SAB  0   TAB  0   Ectopic  0   Multiple  0   Live Births  1            Home Medications    Prior to Admission medications   Medication Sig Start Date End Date Taking? Authorizing Provider  cyclobenzaprine (FLEXERIL) 5 MG tablet Take 1-2 tablets (5-10 mg total) by mouth 2 (two) times daily as needed for muscle spasms. Patient not taking: Reported on 02/22/2019 01/25/19   Wieters, Madelynn Done C, PA-C  diclofenac sodium (VOLTAREN) 1 % GEL Apply 2 g topically 4 (four) times daily. 05/03/19   [name], FNP  mometasone (NASONEX) 50 MCG/ACT nasal spray Place 2 sprays into the nose daily. 05/03/19 05/02/20  [name], FNP  nicotine (NICODERM CQ - DOSED IN MG/24 HOURS) 21 mg/24hr patch Place 1 patch (21 mg total) onto the skin daily. 05/03/19 05/02/20  [name], FNP    Family History Family History  Problem Relation Age of Onset  . Diabetes Mother   . Cystic fibrosis Sister   . Cancer Sister        ?  Marland Kitchen Heart disease Maternal Grandmother   . Clotting disorder Maternal Grandfather     Social History Social History   Tobacco Use  . Smoking status: Current Every Day Smoker    Packs/day: 0.50    Types: Cigarettes  . Smokeless tobacco: Never Used  . Tobacco comment: 4 cig a day   Substance Use Topics  . Alcohol use: Yes    Comment: occas  . Drug use: Yes    Types:  Marijuana     Allergies   Sulfa antibiotics, Ibuprofen, Influenza vaccines, Lipitor [atorvastatin calcium], and Naproxen   Review of Systems Review of Systems All other systems negative except as documented in the HPI. All pertinent positives and negatives as reviewed in the HPI.  Physical Exam Updated Vital Signs BP (!) 175/79 (BP Location: Right Arm)   Pulse (!) 56   Temp 98.3 F (36.8 C) (Oral)   Resp 16   SpO2 99%   Physical Exam Vitals signs and nursing note reviewed.  Constitutional:      General: She is not in acute distress.    Appearance: She is well-developed.  HENT:     Head: Normocephalic and atraumatic.     Nose: Congestion and rhinorrhea present.  Eyes:     General:        Right eye: Discharge present.        Left eye: Discharge present.    Pupils: Pupils are equal, round, and reactive to light.  Neck:     Musculoskeletal: Normal range of motion and neck supple.  Cardiovascular:     Rate and Rhythm: Normal rate and regular rhythm.     Heart sounds: Normal heart sounds. No murmur. No friction rub. No gallop.   Pulmonary:     Effort: Pulmonary effort is normal. No respiratory distress.     Breath sounds: Normal breath sounds. No wheezing.  Skin:    General: Skin is warm and dry.     Capillary Refill: Capillary refill takes less than 2 seconds.     Findings: No erythema or rash.  Neurological:     Mental Status: She is alert and oriented to person, place, and time.     Motor: No abnormal muscle tone.     Coordination: Coordination normal.  Psychiatric:        Behavior: Behavior normal.      ED Treatments / Results  Labs (all labs ordered are listed, but only abnormal results are displayed) Labs Reviewed - No data to display  EKG None  Radiology No results found.  Procedures Procedures (including critical care time)  Medications Ordered in ED Medications - No data to display   Initial Impression / Assessment and Plan / ED Course  I  have reviewed the triage vital signs and the nursing notes.  Pertinent labs & imaging results that were available during my care of the patient were reviewed by me and considered in my medical decision making (see chart for details).        Patient most likely has a sinus type infection based on her symptoms.  Patient states  she gets these fairly frequently.  She states that she has had this discharge from her eyes in the past.  She feels like is related to her sinuses.  Final Clinical Impressions(s) / ED Diagnoses   Final diagnoses:  None    ED Discharge Orders    None       Dalia Heading, PA-C 06/07/19 2333    Blanchie Dessert, MD 06/08/19 [phone]

## 2019-06-04 NOTE — ED Triage Notes (Signed)
Pt reports ongoing sinus headache, denies fever and cough, with new eye drainage and pain x 3 days.

## 2019-06-04 NOTE — Discharge Instructions (Addendum)
Follow-up with your primary doctor.  Return here as needed.  Tylenol for any discomfort.

## 2019-06-06 ENCOUNTER — Telehealth: Payer: Self-pay

## 2019-06-06 DIAGNOSIS — H2513 Age-related nuclear cataract, bilateral: Secondary | ICD-10-CM | POA: Diagnosis not present

## 2019-06-06 DIAGNOSIS — H04123 Dry eye syndrome of bilateral lacrimal glands: Secondary | ICD-10-CM | POA: Diagnosis not present

## 2019-06-06 DIAGNOSIS — H1013 Acute atopic conjunctivitis, bilateral: Secondary | ICD-10-CM | POA: Diagnosis not present

## 2019-06-06 NOTE — Telephone Encounter (Signed)
Patient called stating she went to ER for her eyes and was given eye drops. She stated she is having problems with the drops I advised her to call her eye doctor to see they can schedule her an appointment due  To [place] not having any available today. YRL,RMA

## 2019-06-08 ENCOUNTER — Emergency Department (HOSPITAL_COMMUNITY): Payer: Medicare Other

## 2019-06-08 ENCOUNTER — Other Ambulatory Visit: Payer: Self-pay

## 2019-06-08 ENCOUNTER — Emergency Department (HOSPITAL_COMMUNITY)
Admission: EM | Admit: 2019-06-08 | Discharge: 2019-06-08 | Disposition: A | Discharge status: Home / Self Care | Payer: Medicare Other | Attending: Emergency Medicine | Admitting: Emergency Medicine

## 2019-06-08 DIAGNOSIS — S29012A Strain of muscle and tendon of back wall of thorax, initial encounter: Secondary | ICD-10-CM | POA: Diagnosis not present

## 2019-06-08 DIAGNOSIS — R1011 Right upper quadrant pain: Secondary | ICD-10-CM | POA: Insufficient documentation

## 2019-06-08 DIAGNOSIS — I10 Essential (primary) hypertension: Secondary | ICD-10-CM | POA: Diagnosis not present

## 2019-06-08 DIAGNOSIS — E119 Type 2 diabetes mellitus without complications: Secondary | ICD-10-CM | POA: Diagnosis not present

## 2019-06-08 DIAGNOSIS — F17210 Nicotine dependence, cigarettes, uncomplicated: Secondary | ICD-10-CM | POA: Insufficient documentation

## 2019-06-08 DIAGNOSIS — R109 Unspecified abdominal pain: Secondary | ICD-10-CM | POA: Diagnosis not present

## 2019-06-08 DIAGNOSIS — R05 Cough: Secondary | ICD-10-CM | POA: Diagnosis not present

## 2019-06-08 DIAGNOSIS — Y939 Activity, unspecified: Secondary | ICD-10-CM | POA: Diagnosis not present

## 2019-06-08 DIAGNOSIS — E039 Hypothyroidism, unspecified: Secondary | ICD-10-CM | POA: Diagnosis not present

## 2019-06-08 DIAGNOSIS — M549 Dorsalgia, unspecified: Secondary | ICD-10-CM | POA: Diagnosis present

## 2019-06-08 DIAGNOSIS — Y999 Unspecified external cause status: Secondary | ICD-10-CM | POA: Insufficient documentation

## 2019-06-08 DIAGNOSIS — Y929 Unspecified place or not applicable: Secondary | ICD-10-CM | POA: Insufficient documentation

## 2019-06-08 DIAGNOSIS — X58XXXA Exposure to other specified factors, initial encounter: Secondary | ICD-10-CM | POA: Insufficient documentation

## 2019-06-08 DIAGNOSIS — M25519 Pain in unspecified shoulder: Secondary | ICD-10-CM | POA: Diagnosis not present

## 2019-06-08 DIAGNOSIS — R079 Chest pain, unspecified: Secondary | ICD-10-CM | POA: Diagnosis not present

## 2019-06-08 DIAGNOSIS — S29019A Strain of muscle and tendon of unspecified wall of thorax, initial encounter: Secondary | ICD-10-CM

## 2019-06-08 LAB — URINALYSIS, ROUTINE W REFLEX MICROSCOPIC
Bacteria, UA: NONE SEEN
Bilirubin Urine: NEGATIVE
Glucose, UA: NEGATIVE mg/dL
Hgb urine dipstick: NEGATIVE
Ketones, ur: NEGATIVE mg/dL
Leukocytes,Ua: NEGATIVE
Nitrite: NEGATIVE
Protein, ur: 100 mg/dL — AB
Specific Gravity, Urine: 1.01 (ref 1.005–1.030)
pH: 6 (ref 5.0–8.0)

## 2019-06-08 LAB — COMPREHENSIVE METABOLIC PANEL
ALT: 18 U/L (ref 0–44)
AST: 19 U/L (ref 15–41)
Albumin: 3.7 g/dL (ref 3.5–5.0)
Alkaline Phosphatase: 72 U/L (ref 38–126)
Anion gap: 11 (ref 5–15)
BUN: 13 mg/dL (ref 6–20)
CO2: 27 mmol/L (ref 22–32)
Calcium: 9.5 mg/dL (ref 8.9–10.3)
Chloride: 99 mmol/L (ref 98–111)
Creatinine, Ser: 1.03 mg/dL — ABNORMAL HIGH (ref 0.44–1.00)
GFR calc Af Amer: >60 mL/min (ref >60)
GFR calc non Af Amer: 59 mL/min — ABNORMAL LOW (ref >60)
Glucose, Bld: 101 mg/dL — ABNORMAL HIGH (ref 70–99)
Potassium: 4.1 mmol/L (ref 3.5–5.1)
Sodium: 137 mmol/L (ref 135–145)
Total Bilirubin: 1.2 mg/dL (ref 0.3–1.2)
Total Protein: 7.9 g/dL (ref 6.5–8.1)

## 2019-06-08 LAB — CBC WITH DIFFERENTIAL/PLATELET
Abs Immature Granulocytes: 0.03 10*3/uL (ref 0.00–0.07)
Basophils Absolute: 0 10*3/uL (ref 0.0–0.1)
Basophils Relative: 1 %
Eosinophils Absolute: 0.1 10*3/uL (ref 0.0–0.5)
Eosinophils Relative: 1 %
HCT: 47 % — ABNORMAL HIGH (ref 36.0–46.0)
Hemoglobin: 16 g/dL — ABNORMAL HIGH (ref 12.0–15.0)
Immature Granulocytes: 0 %
Lymphocytes Relative: 27 %
Lymphs Abs: 2 10*3/uL (ref 0.7–4.0)
MCH: 31.9 pg (ref 26.0–34.0)
MCHC: 34 g/dL (ref 30.0–36.0)
MCV: 93.6 fL (ref 80.0–100.0)
Monocytes Absolute: 1 10*3/uL (ref 0.1–1.0)
Monocytes Relative: 14 %
Neutro Abs: 4.2 10*3/uL (ref 1.7–7.7)
Neutrophils Relative %: 57 %
Platelets: 240 10*3/uL (ref 150–400)
RBC: 5.02 MIL/uL (ref 3.87–5.11)
RDW: 12.3 % (ref 11.5–15.5)
WBC: 7.4 10*3/uL (ref 4.0–10.5)
nRBC: 0 % (ref 0.0–0.2)

## 2019-06-08 MED ORDER — LIDOCAINE 5 % EX PTCH: 1.0000 | MEDICATED_PATCH | CUTANEOUS | 0 refills | Status: DC

## 2019-06-08 MED ORDER — OXYCODONE HCL 5 MG PO TABS
5.0000 mg | ORAL_TABLET | Freq: Once | ORAL | Status: AC
  Administered 2019-06-08: 5 mg via ORAL
  Filled 2019-06-08: qty 1

## 2019-06-08 MED ORDER — LIDOCAINE 5 % EX PTCH
1.0000 | MEDICATED_PATCH | CUTANEOUS | Status: DC
  Administered 2019-06-08: 1 via TRANSDERMAL
  Filled 2019-06-08: qty 1

## 2019-06-08 NOTE — Discharge Instructions (Addendum)
You can use lidoderm cream or salonpas patches, available over the counter.

## 2019-06-08 NOTE — ED Provider Notes (Signed)
[facility] EMERGENCY DEPARTMENT Provider Note   CSN: [id number] Arrival date & time: 06/08/19  3825    History   Chief Complaint Chief Complaint  Patient presents with  . Back Pain    HPI Margaret Webster is a 58 y.o. female.     The history is provided by the patient and medical records. No language interpreter was used.   Margaret Webster is a 58 y.o. female who presents to the Emergency Department complaining of back pain.  She complains of sudden onset right scapular pain that began last night. Pain is worse with deep breaths as well as movement. No prior similar symptoms. She is taken Tylenol at home with no relief. She denies any fevers, cough, shortness of breath, Donald pain, dysuria, nausea, vomiting. She does have a history of back pain but this is different from prior episodes. No reports of lower extremity swelling or pain. No history of DVT/PE. Past Medical History:  Diagnosis Date  . Arthritis    bil shoulders  . Bronchitis   . Chronic back pain   . Diabetes mellitus    stopped Mrtformin 1 mo ago, made her "sick"  . Hypertension   . Hypothyroidism   . Thyroid disease   . Trigger thumb of right hand     Patient Active Problem List   Diagnosis Date Noted  . Prediabetes 09/08/2018  . Chest pain 01/10/2015  . Diabetes mellitus 01/10/2015  . Hypertension 01/10/2015  . Thyroid disease 01/10/2015  . Headache 01/10/2015  . Abdominal pain 01/10/2015  . Vaginitis 04/07/2014  . Pain 04/07/2014  . Vaginal irritation 04/07/2014  . Boil of buttock 04/06/2014  . BV (bacterial vaginosis) 04/06/2014  . Unspecified symptom associated with female genital organs 11/16/2013  . Candidiasis of vulva and vagina 11/16/2013  . Vaginitis and vulvovaginitis, unspecified 06/07/2013  . Pain aggravated by activities of daily living 06/07/2013  . Nonspecific abnormal finding in stool contents 12/02/2011  . Diabetes mellitus without mention of complication  05/39/7673  . Chronic back pain     Past Surgical History:  Procedure Laterality Date  . BACK SURGERY  1993  . CARPAL TUNNEL RELEASE  08/2015  . CARPAL TUNNEL RELEASE Left 09/10/2017   Procedure: CARPAL TUNNEL RELEASE;  Surgeon: Leanora Cover, MD;  Location: [facility];  Service: Orthopedics;  Laterality: Left;  . CERVICAL SPINE SURGERY Right 11/2012  . OOPHORECTOMY    . TRIGGER FINGER RELEASE Right 08/06/2017   Procedure: RIGHT THUMB TRIGGER RELEASE;  Surgeon: Leanora Cover, MD;  Location: [facility];  Service: Orthopedics;  Laterality: Right;     OB History    Gravida  1   Para  0   Term  0   Preterm  0   AB  0   Living  1     SAB  0   TAB  0   Ectopic  0   Multiple  0   Live Births  1            Home Medications    Prior to Admission medications   Medication Sig Start Date End Date Taking? Authorizing Provider  polyvinyl alcohol (LIQUIFILM TEARS) 1.4 % ophthalmic solution Place 1 drop into both eyes as needed for dry eyes.   Yes [provider]  Jay Schlichter Oil (ARTIFICIAL TEARS) ointment Place 1 application into both eyes as needed (eye irritation).   Yes [provider]  diclofenac sodium (VOLTAREN) 1 %  GEL Apply 2 g topically 4 (four) times daily. Patient not taking: Reported on 06/08/2019 05/03/19   [name], FNP  lidocaine (LIDODERM) 5 % Place 1 patch onto the skin daily. Remove & Discard patch within 12 hours or as directed by MD 06/08/19   Quintella Reichert, MD  mometasone (NASONEX) 50 MCG/ACT nasal spray Place 2 sprays into the nose daily. Patient not taking: Reported on 06/08/2019 05/03/19 05/02/20  [name], FNP  nicotine (NICODERM CQ - DOSED IN MG/24 HOURS) 21 mg/24hr patch Place 1 patch (21 mg total) onto the skin daily. Patient not taking: Reported on 06/08/2019 05/03/19 05/02/20  [name], FNP    Family History Family History  Problem Relation Age of Onset  . Diabetes Mother   .  Cystic fibrosis Sister   . Cancer Sister        ?  Marland Kitchen Heart disease Maternal Grandmother   . Clotting disorder Maternal Grandfather     Social History Social History   Tobacco Use  . Smoking status: Current Every Day Smoker    Packs/day: 0.50    Types: Cigarettes  . Smokeless tobacco: Never Used  . Tobacco comment: 4 cig a day   Substance Use Topics  . Alcohol use: Yes    Comment: occas  . Drug use: Yes    Types: Marijuana     Allergies   Sulfa antibiotics, Aspirin, Ibuprofen, Polytrim [polymyxin b-trimethoprim], Influenza vaccines, Lipitor [atorvastatin calcium], and Naproxen   Review of Systems Review of Systems  All other systems reviewed and are negative.    Physical Exam Updated Vital Signs BP 137/73   Pulse (!) 55   Temp 99 F (37.2 C) (Oral)   Resp 18   SpO2 100%   Physical Exam Vitals signs and nursing note reviewed.  Constitutional:      Appearance: She is well-developed.  HENT:     Head: Normocephalic and atraumatic.  Cardiovascular:     Rate and Rhythm: Normal rate and regular rhythm.     Heart sounds: No murmur.  Pulmonary:     Effort: Pulmonary effort is normal. No respiratory distress.     Breath sounds: Normal breath sounds.  Abdominal:     Palpations: Abdomen is soft.     Tenderness: There is no abdominal tenderness. There is no guarding or rebound.  Musculoskeletal: Normal range of motion.        General: No swelling or tenderness.       Arms:     Comments: Tender to palpation over the right scapula and flank region without rashes. No midline thoracic or lumbar tenderness. 2+ DP pulses bilaterally  Skin:    General: Skin is warm and dry.  Neurological:     Mental Status: She is alert and oriented to person, place, and time.     Comments: Five out of five grip strength and bilateral upper extremities. Five out of five strength and bilateral lower extremities.  Psychiatric:        Behavior: Behavior normal.      ED Treatments /  Results  Labs (all labs ordered are listed, but only abnormal results are displayed) Labs Reviewed  COMPREHENSIVE METABOLIC PANEL - Abnormal; Notable for the following components:      Result Value   Glucose, Bld 101 (*)    Creatinine, Ser 1.03 (*)    GFR calc non Af Amer 59 (*)    All other components within normal limits  CBC WITH DIFFERENTIAL/PLATELET - Abnormal; Notable for  the following components:   Hemoglobin 16.0 (*)    HCT 47.0 (*)    All other components within normal limits  URINALYSIS, ROUTINE W REFLEX MICROSCOPIC - Abnormal; Notable for the following components:   Protein, ur 100 (*)    All other components within normal limits    EKG None  Radiology Dg Chest 2 View  Result Date: 06/08/2019 CLINICAL DATA:  Right flank pain EXAM: CHEST - 2 VIEW COMPARISON:  Multiple priors, most recent Mar 27, 2015 radiograph FINDINGS: The heart size and mediastinal contours are within normal limits. Both lungs are clear. The visualized skeletal structures are unremarkable. Degenerative changes are present in the imaged spine and shoulders. Cervical fixation hardware is grossly normal in appearance though incompletely assessed on these nondedicated radiographs. IMPRESSION: No active cardiopulmonary disease. Electronically Signed   By: Lovena Le M.D.   On: 06/08/2019 13:36    Procedures Procedures (including critical care time)  Medications Ordered in ED Medications  lidocaine (LIDODERM) 5 % 1 patch (1 patch Transdermal Patch Applied 06/08/19 1352)  oxyCODONE (Oxy IR/ROXICODONE) immediate release tablet 5 mg (5 mg Oral Given 06/08/19 1350)     Initial Impression / Assessment and Plan / ED Course  I have reviewed the triage vital signs and the nursing notes.  Pertinent labs & imaging results that were available during my care of the patient were reviewed by me and considered in my medical decision making (see chart for details).  Clinical Course as of Jun 07 1521  Wed Jun 08, 2019  1403 DG Chest 2 View [WF]    Clinical Course User Index [WF] Jerel Shepherd      Patient here for evaluation of right sided scapular pain. Pain is reproducible on examination. Her pain is significantly improved after treatment in the emergency department. Presentation is not consistent with ACS, PE, dissection, cholecystitis, pyelonephritis. Discussed with patient home care for muscle strain and pain. Discussed outpatient follow-up and return precautions.   Final Clinical Impressions(s) / ED Diagnoses   Final diagnoses:  Thoracic myofascial strain, initial encounter    ED Discharge Orders         Ordered    lidocaine (LIDODERM) 5 %  Every 24 hours     06/08/19 1521           Quintella Reichert, MD 06/08/19 1523

## 2019-06-08 NOTE — ED Triage Notes (Signed)
Patient reports severe upper to lower back pain that is worse with any movement, including coughing. History of chronic back pain, but states this isn't her normal. Denies numbness/tingling. Patient in tears during triage.

## 2019-06-22 DIAGNOSIS — H04123 Dry eye syndrome of bilateral lacrimal glands: Secondary | ICD-10-CM | POA: Diagnosis not present

## 2019-06-22 DIAGNOSIS — E119 Type 2 diabetes mellitus without complications: Secondary | ICD-10-CM | POA: Diagnosis not present

## 2019-06-22 DIAGNOSIS — H2513 Age-related nuclear cataract, bilateral: Secondary | ICD-10-CM | POA: Diagnosis not present

## 2019-06-22 LAB — HM DIABETES EYE EXAM

## 2019-08-25 ENCOUNTER — Other Ambulatory Visit: Payer: Self-pay

## 2019-08-25 ENCOUNTER — Encounter (HOSPITAL_COMMUNITY): Payer: Self-pay

## 2019-08-25 ENCOUNTER — Emergency Department (HOSPITAL_COMMUNITY)
Admission: EM | Admit: 2019-08-25 | Discharge: 2019-08-25 | Disposition: A | Discharge status: Home / Self Care | Payer: Medicare Other | Attending: Emergency Medicine | Admitting: Emergency Medicine

## 2019-08-25 ENCOUNTER — Emergency Department (HOSPITAL_COMMUNITY): Payer: Medicare Other

## 2019-08-25 DIAGNOSIS — I10 Essential (primary) hypertension: Secondary | ICD-10-CM | POA: Insufficient documentation

## 2019-08-25 DIAGNOSIS — F17210 Nicotine dependence, cigarettes, uncomplicated: Secondary | ICD-10-CM | POA: Insufficient documentation

## 2019-08-25 DIAGNOSIS — S60221A Contusion of right hand, initial encounter: Secondary | ICD-10-CM | POA: Insufficient documentation

## 2019-08-25 DIAGNOSIS — Y9289 Other specified places as the place of occurrence of the external cause: Secondary | ICD-10-CM | POA: Diagnosis not present

## 2019-08-25 DIAGNOSIS — S6991XA Unspecified injury of right wrist, hand and finger(s), initial encounter: Secondary | ICD-10-CM | POA: Diagnosis not present

## 2019-08-25 DIAGNOSIS — Y9389 Activity, other specified: Secondary | ICD-10-CM | POA: Diagnosis not present

## 2019-08-25 DIAGNOSIS — W208XXA Other cause of strike by thrown, projected or falling object, initial encounter: Secondary | ICD-10-CM | POA: Insufficient documentation

## 2019-08-25 DIAGNOSIS — E039 Hypothyroidism, unspecified: Secondary | ICD-10-CM | POA: Diagnosis not present

## 2019-08-25 DIAGNOSIS — Y999 Unspecified external cause status: Secondary | ICD-10-CM | POA: Insufficient documentation

## 2019-08-25 DIAGNOSIS — E119 Type 2 diabetes mellitus without complications: Secondary | ICD-10-CM | POA: Diagnosis not present

## 2019-08-25 DIAGNOSIS — Z79899 Other long term (current) drug therapy: Secondary | ICD-10-CM | POA: Insufficient documentation

## 2019-08-25 DIAGNOSIS — M79641 Pain in right hand: Secondary | ICD-10-CM | POA: Diagnosis not present

## 2019-08-25 NOTE — ED Triage Notes (Signed)
Pt arrives POV for eval of R hand pain. Pt reports a trash can was picked up by the wind a struck her hand, reports pain to entire hand, particularly pointer finger and middle finger. Full ROM of R hand.

## 2019-08-25 NOTE — ED Provider Notes (Signed)
[facility] EMERGENCY DEPARTMENT Provider Note   CSN: [id number] Arrival date & time: 08/25/19  1319     History   Chief Complaint Chief Complaint  Patient presents with  . Hand Pain    HPI Margaret Webster is a 58 y.o. female.     HPI   Patient is a 58 year old female with a history of diabetes, hypertension, thyroid disease, who presents to the emergency department today complaining of right hand pain.  Pain located over the MCPs of the second and third digits.  Pain is worse with movement.  It is constant.  States that pain started after a trash can blew over in the wind and hit her in the right hand.  She does have a history of issues with this right hand.  Past Medical History:  Diagnosis Date  . Arthritis    bil shoulders  . Bronchitis   . Chronic back pain   . Diabetes mellitus    stopped Mrtformin 1 mo ago, made her "sick"  . Hypertension   . Hypothyroidism   . Thyroid disease   . Trigger thumb of right hand     Patient Active Problem List   Diagnosis Date Noted  . Prediabetes 09/08/2018  . Chest pain 01/10/2015  . Diabetes mellitus 01/10/2015  . Hypertension 01/10/2015  . Thyroid disease 01/10/2015  . Headache 01/10/2015  . Abdominal pain 01/10/2015  . Vaginitis 04/07/2014  . Pain 04/07/2014  . Vaginal irritation 04/07/2014  . Boil of buttock 04/06/2014  . BV (bacterial vaginosis) 04/06/2014  . Unspecified symptom associated with female genital organs 11/16/2013  . Candidiasis of vulva and vagina 11/16/2013  . Vaginitis and vulvovaginitis, unspecified 06/07/2013  . Pain aggravated by activities of daily living 06/07/2013  . Nonspecific abnormal finding in stool contents 12/02/2011  . Diabetes mellitus without mention of complication [id number]  . Chronic back pain     Past Surgical History:  Procedure Laterality Date  . BACK SURGERY  1993  . CARPAL TUNNEL RELEASE  08/2015  . CARPAL TUNNEL RELEASE Left 09/10/2017   Procedure:  CARPAL TUNNEL RELEASE;  Surgeon: Leanora Cover, MD;  Location: [facility];  Service: Orthopedics;  Laterality: Left;  . CERVICAL SPINE SURGERY Right 11/2012  . OOPHORECTOMY    . TRIGGER FINGER RELEASE Right 08/06/2017   Procedure: RIGHT THUMB TRIGGER RELEASE;  Surgeon: Leanora Cover, MD;  Location: [facility];  Service: Orthopedics;  Laterality: Right;     OB History    Gravida  1   Para  0   Term  0   Preterm  0   AB  0   Living  1     SAB  0   TAB  0   Ectopic  0   Multiple  0   Live Births  1            Home Medications    Prior to Admission medications   Medication Sig Start Date End Date Taking? Authorizing Provider  diclofenac sodium (VOLTAREN) 1 % GEL Apply 2 g topically 4 (four) times daily. Patient not taking: Reported on 06/08/2019 05/03/19   [name], FNP  lidocaine (LIDODERM) 5 % Place 1 patch onto the skin daily. Remove & Discard patch within 12 hours or as directed by MD 06/08/19   Quintella Reichert, MD  mometasone (NASONEX) 50 MCG/ACT nasal spray Place 2 sprays into the nose daily. Patient not taking: Reported on 06/08/2019 05/03/19 05/02/20  Laurance Flatten,  Doreene Burke, FNP  nicotine (NICODERM CQ - DOSED IN MG/24 HOURS) 21 mg/24hr patch Place 1 patch (21 mg total) onto the skin daily. Patient not taking: Reported on 06/08/2019 05/03/19 05/02/20  [name], FNP  polyvinyl alcohol (LIQUIFILM TEARS) 1.4 % ophthalmic solution Place 1 drop into both eyes as needed for dry eyes.    [provider]  Jay Schlichter Oil (ARTIFICIAL TEARS) ointment Place 1 application into both eyes as needed (eye irritation).    [provider]    Family History Family History  Problem Relation Age of Onset  . Diabetes Mother   . Cystic fibrosis Sister   . Cancer Sister        ?  Marland Kitchen Heart disease Maternal Grandmother   . Clotting disorder Maternal Grandfather     Social History Social History   Tobacco Use  . Smoking status:  Current Every Day Smoker    Packs/day: 0.50    Types: Cigarettes  . Smokeless tobacco: Never Used  . Tobacco comment: 4 cig a day   Substance Use Topics  . Alcohol use: Yes    Comment: occas  . Drug use: Yes    Types: Marijuana     Allergies   Sulfa antibiotics, Aspirin, Ibuprofen, Polytrim [polymyxin b-trimethoprim], Influenza vaccines, Lipitor [atorvastatin calcium], and Naproxen   Review of Systems Review of Systems  Constitutional: Negative for fever.  Musculoskeletal:       Right hand pain  Skin: Negative for wound.  Neurological: Negative for weakness and numbness.     Physical Exam Updated Vital Signs BP (!) 149/69 (BP Location: Left Arm)   Pulse 65   Temp 98.6 F (37 C) (Oral)   Resp 16   Ht 5\' 7"  (1.702 m)   Wt 93.9 kg   SpO2 98%   BMI 32.42 kg/m   Physical Exam Vitals signs and nursing note reviewed.  Constitutional:      General: She is not in acute distress.    Appearance: She is well-developed.  HENT:     Head: Normocephalic and atraumatic.  Eyes:     Conjunctiva/sclera: Conjunctivae normal.  Neck:     Musculoskeletal: Neck supple.  Cardiovascular:     Rate and Rhythm: Normal rate.  Pulmonary:     Effort: Pulmonary effort is normal.  Musculoskeletal: Normal range of motion.     Comments: TTP over the MCP's of the 2nd and 4rd digits of the right hand. Mild soft tissue swelling. Normal ROM and sensation. Normal distal pulses. No significant bony TTP to the right wrist.   Skin:    General: Skin is warm and dry.  Neurological:     Mental Status: She is alert.      ED Treatments / Results  Labs (all labs ordered are listed, but only abnormal results are displayed) Labs Reviewed - No data to display  EKG None  Radiology Dg Hand Complete Right  Result Date: 08/25/2019 CLINICAL DATA:  Hand pain injury to index finger EXAM: RIGHT HAND - COMPLETE 3+ VIEW COMPARISON:  09/05/2018 FINDINGS: No fracture or malalignment. Mild degenerative  changes at the first MCP joint. Metallic ring on the fourth digit. IMPRESSION: No acute osseous abnormality Electronically Signed   By: Donavan Foil M.D.   On: 08/25/2019 14:12    Procedures Procedures (including critical care time)  Medications Ordered in ED Medications - No data to display   Initial Impression / Assessment and Plan / ED Course  I have reviewed the triage vital  signs and the nursing notes.  Pertinent labs & imaging results that were available during my care of the patient were reviewed by me and considered in my medical decision making (see chart for details).    Final Clinical Impressions(s) / ED Diagnoses   Final diagnoses:  Contusion of right hand, initial encounter    Patient is a 58 year old female with a history of diabetes, hypertension, thyroid disease, who presents to the emergency department today complaining of right hand pain.  Pain located over the MCPs of the second and third digits.  Pain is worse with movement.  It is constant.  States that pain started after a trash can blew over in the wind and hit her in the right hand.  She does have a history of issues with this right hand.  Patient has normal range of motion, neurovascularly intact.  X-ray of the right hand is negative for acute fracture.  She is requesting a splint or something to immobilize the hand.  Will place Ace wrap.  She is comfortable with this.  Advised Tylenol, anti-inflammatories, and PCP follow-up.  Return precautions discussed.  She voices understanding of the plan and reasons to return.  All questions answered.  Patient stable for discharge.  ED Discharge Orders    None       Bishop Dublin 08/25/19 1526    Hayden Rasmussen, MD 08/25/19 [phone]

## 2019-08-25 NOTE — Discharge Instructions (Signed)

## 2019-08-25 NOTE — ED Notes (Signed)
Applied ace wrap to pt's right hand

## 2019-09-19 ENCOUNTER — Other Ambulatory Visit: Payer: Self-pay | Admitting: Nurse Practitioner

## 2019-09-19 ENCOUNTER — Encounter: Payer: Self-pay | Admitting: Nurse Practitioner

## 2019-09-19 ENCOUNTER — Ambulatory Visit (INDEPENDENT_AMBULATORY_CARE_PROVIDER_SITE_OTHER): Payer: Medicare Other | Admitting: Nurse Practitioner

## 2019-09-19 ENCOUNTER — Other Ambulatory Visit: Payer: Self-pay

## 2019-09-19 VITALS — BP 120/84 | HR 67 | Temp 98.2°F | Ht 66.6 in | Wt 216.6 lb

## 2019-09-19 DIAGNOSIS — M25511 Pain in right shoulder: Secondary | ICD-10-CM

## 2019-09-19 DIAGNOSIS — R7303 Prediabetes: Secondary | ICD-10-CM

## 2019-09-19 DIAGNOSIS — M25551 Pain in right hip: Secondary | ICD-10-CM | POA: Diagnosis not present

## 2019-09-19 DIAGNOSIS — R21 Rash and other nonspecific skin eruption: Secondary | ICD-10-CM | POA: Diagnosis not present

## 2019-09-19 DIAGNOSIS — I10 Essential (primary) hypertension: Secondary | ICD-10-CM | POA: Diagnosis not present

## 2019-09-19 DIAGNOSIS — Z23 Encounter for immunization: Secondary | ICD-10-CM

## 2019-09-19 DIAGNOSIS — G4700 Insomnia, unspecified: Secondary | ICD-10-CM

## 2019-09-19 DIAGNOSIS — Z716 Tobacco abuse counseling: Secondary | ICD-10-CM

## 2019-09-19 DIAGNOSIS — F329 Major depressive disorder, single episode, unspecified: Secondary | ICD-10-CM

## 2019-09-19 DIAGNOSIS — E2839 Other primary ovarian failure: Secondary | ICD-10-CM

## 2019-09-19 HISTORY — DX: Pain in right shoulder: M25.511

## 2019-09-19 LAB — POCT UA - MICROALBUMIN
Albumin/Creatinine Ratio, Urine, POC: 300
Creatinine, POC: 100 mg/dL
Microalbumin Ur, POC: 150 mg/L

## 2019-09-19 LAB — POCT URINALYSIS DIPSTICK
Bilirubin, UA: NEGATIVE
Blood, UA: NEGATIVE
Glucose, UA: NEGATIVE
Ketones, UA: NEGATIVE
Leukocytes, UA: NEGATIVE
Nitrite, UA: NEGATIVE
Protein, UA: POSITIVE — AB
Spec Grav, UA: 1.015 (ref 1.010–1.025)
Urobilinogen, UA: 0.2 E.U./dL
pH, UA: 5.5 (ref 5.0–8.0)

## 2019-09-19 MED ORDER — SHINGRIX 50 MCG/0.5ML IM SUSR: 0.5000 mL | Freq: Once | INTRAMUSCULAR | 0 refills | Status: AC

## 2019-09-19 MED ORDER — TRIAMCINOLONE ACETONIDE 40 MG/ML IJ SUSP
40.0000 mg | Freq: Once | INTRAMUSCULAR | Status: AC
  Administered 2019-09-19: 40 mg via INTRAMUSCULAR

## 2019-09-19 MED ORDER — VALACYCLOVIR HCL 500 MG PO TABS: 500.0000 mg | ORAL_TABLET | Freq: Two times a day (BID) | ORAL | 0 refills | Status: DC

## 2019-09-19 MED ORDER — TRAZODONE HCL 50 MG PO TABS: 50.0000 mg | ORAL_TABLET | Freq: Every day | ORAL | 2 refills | Status: DC

## 2019-09-19 NOTE — Progress Notes (Signed)
Subjective:     Patient ID: Margaret Webster , female    DOB: May 28, 1961 , 58 y.o.   MRN: [id number]   Chief Complaint  Patient presents with  . Rash    Patient stated her rash has reappeared it is on her back and it is painful  . Diabetes    HPI  Rash This is a chronic problem. Location: crease of buttocks.  Diabetes She presents for her follow-up diabetic visit. She has type 2 diabetes mellitus. Pertinent negatives for hypoglycemia include no dizziness or headaches. Pertinent negatives for diabetes include no chest pain.     Past Medical History:  Diagnosis Date  . Arthritis    bil shoulders  . Bronchitis   . Chronic back pain   . Diabetes mellitus    stopped Mrtformin 1 mo ago, made her "sick"  . Hypertension   . Hypothyroidism   . Thyroid disease   . Trigger thumb of right hand      Family History  Problem Relation Age of Onset  . Diabetes Mother   . Cystic fibrosis Sister   . Cancer Sister        ?  Marland Kitchen Heart disease Maternal Grandmother   . Clotting disorder Maternal Grandfather      Current Outpatient Medications:  .  diclofenac sodium (VOLTAREN) 1 % GEL, Apply 2 g topically 4 (four) times daily., Disp: 100 g, Rfl: 2 .  lidocaine (LIDODERM) 5 %, Place 1 patch onto the skin daily. Remove & Discard patch within 12 hours or as directed by MD, Disp: 30 patch, Rfl: 0 .  mometasone (NASONEX) 50 MCG/ACT nasal spray, Place 2 sprays into the nose daily., Disp: 17 g, Rfl: 2 .  polyvinyl alcohol (LIQUIFILM TEARS) 1.4 % ophthalmic solution, Place 1 drop into both eyes as needed for dry eyes., Disp: , Rfl:  .  nicotine (NICODERM CQ - DOSED IN MG/24 HOURS) 21 mg/24hr patch, Place 1 patch (21 mg total) onto the skin daily. (Patient not taking: Reported on 06/08/2019), Disp: 30 patch, Rfl: 1   Allergies  Allergen Reactions  . Sulfa Antibiotics Other (See Comments)    Hard to breathe  . Aspirin Other (See Comments)    Per pcp  . Ibuprofen Other (See Comments)    Stomach  upset  . Polytrim [Polymyxin B-Trimethoprim] Itching and Swelling    redness  . Influenza Vaccines Hives  . Lipitor [Atorvastatin Calcium] Other (See Comments)    Muscle ache   . Naproxen Nausea And Vomiting     Review of Systems  Constitutional: Negative.   Respiratory: Negative.   Cardiovascular: Negative.  Negative for chest pain, palpitations and leg swelling.  Musculoskeletal: Positive for arthralgias (right shoulder and right hip). Negative for back pain.  Skin: Positive for rash (left upper buttocks with lesions present).  Neurological: Negative for dizziness and headaches.     Today's Vitals   09/19/19 1521  BP: 120/84  Pulse: 67  Temp: 98.2 F (36.8 C)  TempSrc: Oral  Weight: 216 lb 9.6 oz (98.2 kg)  Height: 5' 6.6" (1.692 m)  PainSc: 8   PainLoc: Back   Body mass index is 34.33 kg/m.   Objective:  Physical Exam Constitutional:      Appearance: Normal appearance.  Cardiovascular:     Rate and Rhythm: Normal rate and regular rhythm.     Pulses: Normal pulses.     Heart sounds: Normal heart sounds. No murmur.  Pulmonary:     Effort:  Pulmonary effort is normal. No respiratory distress.     Breath sounds: Normal breath sounds.  Musculoskeletal:        General: Tenderness (left hip and right anterior shoulder at bursa space) present. No swelling, deformity or signs of injury.     Right lower leg: No edema.     Left lower leg: No edema.  Neurological:     General: No focal deficit present.     Mental Status: She is alert and oriented to person, place, and time.  Psychiatric:        Mood and Affect: Mood normal.        Behavior: Behavior normal.        Thought Content: Thought content normal.        Judgment: Judgment normal.         Assessment And Plan:     1. Essential hypertension . B/P is under good control.   . The importance of regular exercise and dietary modification was stressed to the patient.  . Stressed importance of losing ten percent  of her body weight to help with B/P control. .  - CCM Social Work  2. Prediabetes  Chronic, controlled  Continue with current medications  Encouraged to limit intake of sugary foods and drinks  Encouraged to increase physical activity to 150 minutes per week - CCM Social Work  3. Rash and nonspecific skin eruption  She has a clustered rash to left hip area  This is likely a herpetic rash - valACYclovir (VALTREX) 500 MG tablet; Take 1 tablet (500 mg total) by mouth 2 (two) times daily.  Dispense: 20 tablet; Refill: 0  4. Right hip pain  Tender on palpation, will treat with kenalog in office  - triamcinolone acetonide (KENALOG-40) injection 40 mg  5. Acute pain of right shoulder  Negative neer's and hawkin's   Pain to right anterior bursa space (bursitis vs arthritis) - triamcinolone acetonide (KENALOG-40) injection 40 mg  6. Encounter for immunization  Wait at least 2 weeks  - Zoster Vaccine Adjuvanted [facility]) injection; Inject 0.5 mLs into the muscle once for 1 dose.  Dispense: 0.5 mL; Refill: 0  7. Encounter for tobacco use cessation counseling Smoking cessation instruction/counseling given:  counseled patient on the dangers of tobacco use, advised patient to stop smoking, and reviewed strategies to maximize success     [name], FNP    THE PATIENT IS ENCOURAGED TO PRACTICE SOCIAL DISTANCING DUE TO THE COVID-19 PANDEMIC.

## 2019-09-20 ENCOUNTER — Ambulatory Visit: Payer: Self-pay

## 2019-09-20 DIAGNOSIS — M1991 Primary osteoarthritis, unspecified site: Secondary | ICD-10-CM

## 2019-09-20 DIAGNOSIS — I10 Essential (primary) hypertension: Secondary | ICD-10-CM

## 2019-09-20 DIAGNOSIS — R7303 Prediabetes: Secondary | ICD-10-CM

## 2019-09-20 LAB — CMP14+EGFR
ALT: 15 IU/L (ref 0–32)
AST: 19 IU/L (ref 0–40)
Albumin/Globulin Ratio: 1.2 (ref 1.2–2.2)
Albumin: 4.1 g/dL (ref 3.8–4.9)
Alkaline Phosphatase: 100 IU/L (ref 39–117)
BUN/Creatinine Ratio: 10 — ABNORMAL LOW (ref 12–28)
BUN: 14 mg/dL (ref 8–27)
Bilirubin Total: 0.3 mg/dL (ref 0.0–1.2)
CO2: 27 mmol/L (ref 20–29)
Calcium: 9.8 mg/dL (ref 8.7–10.3)
Chloride: 102 mmol/L (ref 96–106)
Creatinine, Ser: 1.46 mg/dL — ABNORMAL HIGH (ref 0.57–1.00)
GFR calc Af Amer: 45 mL/min/{1.73_m2} — ABNORMAL LOW (ref >59)
GFR calc non Af Amer: 39 mL/min/{1.73_m2} — ABNORMAL LOW (ref >59)
Globulin, Total: 3.3 g/dL (ref 1.5–4.5)
Glucose: 111 mg/dL — ABNORMAL HIGH (ref 65–99)
Potassium: 4.8 mmol/L (ref 3.5–5.2)
Sodium: 140 mmol/L (ref 134–144)
Total Protein: 7.4 g/dL (ref 6.0–8.5)

## 2019-09-20 LAB — HGB A1C W/O EAG: Hgb A1c MFr Bld: 5.8 % — ABNORMAL HIGH (ref 4.8–5.6)

## 2019-09-20 NOTE — Chronic Care Management (AMB) (Signed)
  Chronic Care Management   Outreach Note  09/20/2019 Name: DONELLA ANZIVINO MRN: [id number] DOB: Jul 09, 1961  Referred by: [name], FNP Reason for referral : Care Coordination   An unsuccessful telephone outreach was attempted today. The patient was referred to the case management team by for assistance with care management and care coordination.   Follow Up Plan: A HIPPA compliant phone message was left for the patient providing contact information and requesting a return call.  The care management team will reach out to the patient again over the next 21 days.   Daneen Schick, BSW, CDP Social Worker, Certified Dementia Practitioner [place] [facility] [phone]

## 2019-10-04 ENCOUNTER — Ambulatory Visit: Payer: Self-pay

## 2019-10-04 DIAGNOSIS — R7303 Prediabetes: Secondary | ICD-10-CM

## 2019-10-04 DIAGNOSIS — M1991 Primary osteoarthritis, unspecified site: Secondary | ICD-10-CM

## 2019-10-04 DIAGNOSIS — I10 Essential (primary) hypertension: Secondary | ICD-10-CM

## 2019-10-05 NOTE — Chronic Care Management (AMB) (Signed)
Chronic Care Management   Social Work General Note  10/04/2019 Name: Margaret Webster MRN: [id number] DOB: 1961-10-20  Margaret Webster is a 58 y.o. year old female who is a primary care patient of [name], [place]. The CCM was consulted to assist the patient with care coordination.   Ms. Belasco was given information about Chronic Care Management services today including:  1. CCM service includes personalized support from designated clinical staff supervised by her physician, including individualized plan of care and coordination with other care providers 2. 24/7 contact phone numbers for assistance for urgent and routine care needs. 3. Service will only be billed when office clinical staff spend 20 minutes or more in a month to coordinate care. 4. Only one practitioner may furnish and bill the service in a calendar month. 5. The patient may stop CCM services at any time (effective at the end of the month) by phone call to the office staff. 6. The patient will be responsible for cost sharing (co-pay) of up to 20% of the service fee (after annual deductible is met).  Patient agreed to services and verbal consent obtained.   Review of patient status, including review of consultants reports, relevant laboratory and other test results, and collaboration with appropriate care team members and the patient's provider was performed as part of comprehensive patient evaluation and provision of chronic care management services.    SDOH (Social Determinants of Health) screening performed today. The patient reports financial concerns surrounding utility costs and food insecurity. The patient is currently receiving support through Food and Nutrition services and can not access local food pantry resources due to diet restrictions. SW discussed opportunity to apply for utility assistance through [facility] program. The patient acknowledges she did receive this assistance last winter and has already  initiated process to receive again. Unfortunately, no other resources SW can offer to assist patient at this time.  During today's call the patient discusses concern over recent labs and not understanding what recent kidney functions mean. "Am I supposed to see a specialist or not?" SW advised the patient that when RN Case Manager contacts her, she will provide the patient with disease specific knowledge. SW also advised the patient SW would request her primary care provider to contact the patient to assist with answering questions.   Outpatient Encounter Medications as of 10/04/2019  Medication Sig   diclofenac sodium (VOLTAREN) 1 % GEL Apply 2 g topically 4 (four) times daily.   lidocaine (LIDODERM) 5 % Place 1 patch onto the skin daily. Remove & Discard patch within 12 hours or as directed by MD   mometasone (NASONEX) 50 MCG/ACT nasal spray Place 2 sprays into the nose daily.   nicotine (NICODERM CQ - DOSED IN MG/24 HOURS) 21 mg/24hr patch Place 1 patch (21 mg total) onto the skin daily. (Patient not taking: Reported on 06/08/2019)   polyvinyl alcohol (LIQUIFILM TEARS) 1.4 % ophthalmic solution Place 1 drop into both eyes as needed for dry eyes.   traZODone (DESYREL) 50 MG tablet Take 1 tablet (50 mg total) by mouth at bedtime.   valACYclovir (VALTREX) 500 MG tablet Take 1 tablet (500 mg total) by mouth 2 (two) times daily.   No facility-administered encounter medications on file as of 10/04/2019.     Follow Up Plan: No SW follow up planned at this time. Collaboration with both RN Case Manager and primary provider regarding patient concern over recent lab values. The patient will be contacted by RN Case  Manager regarding disease management and education.       Daneen Schick, BSW, CDP Social Worker, Certified Dementia Practitioner [place] [phone]

## 2019-10-13 ENCOUNTER — Ambulatory Visit: Payer: Medicare Other

## 2019-10-13 ENCOUNTER — Ambulatory Visit: Payer: Medicare Other | Admitting: Nurse Practitioner

## 2019-10-17 ENCOUNTER — Other Ambulatory Visit: Payer: Self-pay | Admitting: Nurse Practitioner

## 2019-10-17 DIAGNOSIS — I10 Essential (primary) hypertension: Secondary | ICD-10-CM

## 2019-10-17 DIAGNOSIS — N1831 Chronic kidney disease, stage 3a: Secondary | ICD-10-CM

## 2019-10-18 ENCOUNTER — Ambulatory Visit: Payer: Medicare Other

## 2019-10-24 ENCOUNTER — Telehealth: Payer: Self-pay

## 2019-10-24 NOTE — Telephone Encounter (Signed)
can you call Margaret Webster for me jm wanted to know if she sees a kidney specialist and if not she will refer her to one so she can have a colonscopy done  Spoke w/pt she has never been to a kidney doctor

## 2019-10-25 ENCOUNTER — Other Ambulatory Visit: Payer: Self-pay

## 2019-10-25 ENCOUNTER — Other Ambulatory Visit: Payer: Self-pay | Admitting: Nurse Practitioner

## 2019-10-25 ENCOUNTER — Ambulatory Visit (INDEPENDENT_AMBULATORY_CARE_PROVIDER_SITE_OTHER): Payer: Medicare Other

## 2019-10-25 ENCOUNTER — Telehealth: Payer: Self-pay

## 2019-10-25 VITALS — Ht 67.0 in | Wt 207.0 lb

## 2019-10-25 DIAGNOSIS — Z0000 Encounter for general adult medical examination without abnormal findings: Secondary | ICD-10-CM

## 2019-10-25 NOTE — Telephone Encounter (Signed)
Patient called requesting a referral for her shoulder I have returned pt call and left her a v/m to call the office [facility]

## 2019-10-25 NOTE — Progress Notes (Signed)
This visit type was conducted due to national recommendations for restrictions regarding the COVID-19 Pandemic (e.g. social distancing). This format is felt to be most appropriate for this patient at this time. All issues noted in this document were discussed and addressed. No physical exam was performed (except for noted visual exam findings with Video Visits). The patient, Margaret Webster, has given consent to perform this visit via video. Vital signs may be absent or patient reported.   Patient location:  At home   Nurse location:  [place] office  Subjective:   Margaret Webster is a 58 y.o. female who presents for Medicare Annual (Subsequent) preventive examination.  Review of Systems:  n/a Cardiac Risk Factors include: hypertension;obesity (BMI >30kg/m2);sedentary lifestyle     Objective:     Vitals: Ht 5\' 7"  (1.702 m) Comment: per patient  Wt 207 lb (93.9 kg) Comment: per patient  BMI 32.42 kg/m   Body mass index is 32.42 kg/m.  Advanced Directives 10/25/2019 08/25/2019 06/08/2019 06/04/2019 02/18/2019 10/19/2018 10/11/2018  Does Patient Have a Medical Advance Directive? No No No No No No No  Would patient like information on creating a medical advance directive? - No - Patient declined No - Patient declined - - No - Patient declined Yes (MAU/Ambulatory/Procedural Areas - Information given)    Tobacco Social History   Tobacco Use  Smoking Status Current Every Day Smoker  . Packs/day: 0.25  . Types: Cigarettes  Smokeless Tobacco Never Used  Tobacco Comment   4 cig a day      Ready to quit: Yes Counseling given: Not Answered Comment: 4 cig a day    Clinical Intake:  Pre-visit preparation completed: Yes  Pain : 0-10 Pain Score: 10-Worst pain ever Pain Type: Chronic pain Pain Location: Shoulder Pain Orientation: Left Pain Radiating Towards: neck and arm Pain Descriptors / Indicators: Shooting Pain Onset: 1 to 4 weeks ago Pain Frequency: Constant Pain Relieving  Factors: nothing really helps  Pain Relieving Factors: nothing really helps  Nutritional Status: BMI > 30  Obese Nutritional Risks: None Diabetes: No  How often do you need to have someone help you when you read instructions, pamphlets, or other written materials from your doctor or pharmacy?: 1 - Never What is the last grade level you completed in school?: 12th grade  Interpreter Needed?: No  Information entered by :: NAllen LPN  Past Medical History:  Diagnosis Date  . Arthritis    bil shoulders  . Bronchitis   . Chronic back pain   . Diabetes mellitus    stopped Mrtformin 1 mo ago, made her "sick"  . Hypertension   . Hypothyroidism   . Thyroid disease   . Trigger thumb of right hand    Past Surgical History:  Procedure Laterality Date  . BACK SURGERY  1993  . CARPAL TUNNEL RELEASE  08/2015  . CARPAL TUNNEL RELEASE Left 09/10/2017   Procedure: CARPAL TUNNEL RELEASE;  Surgeon: Leanora Cover, MD;  Location: [facility];  Service: Orthopedics;  Laterality: Left;  . CERVICAL SPINE SURGERY Right 11/2012  . OOPHORECTOMY    . TRIGGER FINGER RELEASE Right 08/06/2017   Procedure: RIGHT THUMB TRIGGER RELEASE;  Surgeon: Leanora Cover, MD;  Location: [facility];  Service: Orthopedics;  Laterality: Right;   Family History  Problem Relation Age of Onset  . Diabetes Mother   . Cystic fibrosis Sister   . Cancer Sister        ?  Marland Kitchen Heart disease  Maternal Grandmother   . Clotting disorder Maternal Grandfather    Social History   Socioeconomic History  . Marital status: Single    Spouse name: Not on file  . Number of children: 1  . Years of education: Not on file  . Highest education level: Not on file  Occupational History  . Occupation: DISABLED    Employer: DISABLED  Tobacco Use  . Smoking status: Current Every Day Smoker    Packs/day: 0.25    Types: Cigarettes  . Smokeless tobacco: Never Used  . Tobacco comment: 4 cig a day   Substance and  Sexual Activity  . Alcohol use: Yes    Comment: occas  . Drug use: Yes    Types: Marijuana  . Sexual activity: Yes    Partners: Male    Birth control/protection: None  Other Topics Concern  . Not on file  Social History Narrative  . Not on file   Social Determinants of Health   Financial Resource Strain: Low Risk   . Difficulty of Paying Living Expenses: Not very hard  Food Insecurity: No Food Insecurity  . Worried About Charity fundraiser in the Last Year: Never true  . Ran Out of Food in the Last Year: Never true  Transportation Needs: No Transportation Needs  . Lack of Transportation (Medical): No  . Lack of Transportation (Non-Medical): No  Physical Activity: Inactive  . Days of Exercise per Week: 0 days  . Minutes of Exercise per Session: 0 min  Stress: No Stress Concern Present  . Feeling of Stress : Not at all  Social Connections:   . Frequency of Communication with Friends and Family: Not on file  . Frequency of Social Gatherings with Friends and Family: Not on file  . Attends Religious Services: Not on file  . Active Member of Clubs or Organizations: Not on file  . Attends Archivist Meetings: Not on file  . Marital Status: Not on file    Outpatient Encounter Medications as of 10/25/2019  Medication Sig  . diclofenac sodium (VOLTAREN) 1 % GEL Apply 2 g topically 4 (four) times daily.  Marland Kitchen lidocaine (LIDODERM) 5 % Place 1 patch onto the skin daily. Remove & Discard patch within 12 hours or as directed by MD  . mometasone (NASONEX) 50 MCG/ACT nasal spray Place 2 sprays into the nose daily.  . polyvinyl alcohol (LIQUIFILM TEARS) 1.4 % ophthalmic solution Place 1 drop into both eyes as needed for dry eyes.  . traZODone (DESYREL) 50 MG tablet Take 1 tablet (50 mg total) by mouth at bedtime.  . valACYclovir (VALTREX) 500 MG tablet Take 1 tablet (500 mg total) by mouth 2 (two) times daily.  . nicotine (NICODERM CQ - DOSED IN MG/24 HOURS) 21 mg/24hr patch Place  1 patch (21 mg total) onto the skin daily. (Patient not taking: Reported on 06/08/2019)   No facility-administered encounter medications on file as of 10/25/2019.    Activities of Daily Living In your present state of health, do you have any difficulty performing the following activities: 10/25/2019  Hearing? N  Vision? Y  Comment blurry at times  Difficulty concentrating or making decisions? Y  Comment forgetfulness  Walking or climbing stairs? Y  Comment back hurts  Dressing or bathing? N  Doing errands, shopping? N  Preparing Food and eating ? N  Using the Toilet? N  In the past six months, have you accidently leaked urine? N  Do you have problems with loss  of bowel control? N  Managing your Medications? N  Managing your Finances? N  Housekeeping or managing your Housekeeping? N  Some recent data might be hidden    Patient Care Team: [name], FNP as PCP - General ([facility]) Daneen Schick as Social Worker    Assessment:   This is a routine wellness examination for Abbigael.  Exercise Activities and Dietary recommendations Current Exercise Habits: The patient does not participate in regular exercise at present  Goals    . Increase physical activity     Would like to join a gym.      . Patient Stated     10/25/2019, wants to get healthy and get help from doctors that she needs       Fall Risk Fall Risk  10/25/2019 09/19/2019 05/03/2019 02/22/2019 11/11/2018  Falls in the past year? 0 0 0 0 0  Number falls in past yr: - - - - -  Injury with Fall? - - - - -  Risk for fall due to : Medication side effect - - - -  Follow up Falls evaluation completed;Education provided;Falls prevention discussed - - - -   Is the patient's home free of loose throw rugs in walkways, pet beds, electrical cords, etc?   yes      Grab bars in the bathroom? no      Handrails on the stairs?   yes      Adequate lighting?   yes  Timed Get Up and Go performed: n/a  Depression  Screen PHQ 2/9 Scores 10/25/2019 09/19/2019 05/03/2019 02/22/2019  PHQ - 2 Score 0 6 0 0  PHQ- 9 Score 1 13 - -     Cognitive Function     6CIT Screen 10/25/2019  What Year? 0 points  What month? 0 points  What time? 0 points  Count back from 20 0 points  Months in reverse 4 points  Repeat phrase 8 points  Total Score 12    Immunization History  Administered Date(s) Administered  . Tdap 05/27/2013, 03/21/2015, 05/10/2018    Qualifies for Shingles Vaccine? n/a  Screening Tests Health Maintenance  Topic Date Due  . PNEUMOCOCCAL POLYSACCHARIDE VACCINE AGE 58-64 HIGH RISK  03/13/1963  . OPHTHALMOLOGY EXAM  12/22/2018  . INFLUENZA VACCINE  01/25/2020 (Originally 05/28/2019)  . HEMOGLOBIN A1C  03/18/2020  . FOOT EXAM  09/18/2020  . URINE MICROALBUMIN  09/18/2020  . MAMMOGRAM  12/02/2020  . COLONOSCOPY  12/07/2021  . PAP SMEAR-Modifier  12/09/2021  . TETANUS/TDAP  05/10/2028  . Hepatitis C Screening  Completed  . HIV Screening  Completed    Cancer Screenings: Lung: Low Dose CT Chest recommended if Age 70-80 years, 30 pack-year currently smoking OR have quit w/in 15years. Patient does not qualify. Breast:  Up to date on Mammogram? Yes   Up to date of Bone Density/Dexa? Yes Colorectal: up to date  Additional Screenings: : Hepatitis C Screening: 10/11/2018     Plan:    Patient wants to get healthy and get help from the doctors that she needs.   I have personally reviewed and noted the following in the patient's chart:   . Medical and social history . Use of alcohol, tobacco or illicit drugs  . Current medications and supplements . Functional ability and status . Nutritional status . Physical activity . Advanced directives . List of other physicians . Hospitalizations, surgeries, and ER visits in previous 12 months . Vitals . Screenings to include cognitive, depression, and  falls . Referrals and appointments  In addition, I have reviewed and discussed with  patient certain preventive protocols, quality metrics, and best practice recommendations. A written personalized care plan for preventive services as well as general preventive health recommendations were provided to patient.     Kellie Simmering, LPN  [id number]

## 2019-10-25 NOTE — Patient Instructions (Signed)
Margaret Webster , Thank you for taking time to come for your Medicare Wellness Visit. I appreciate your ongoing commitment to your health goals. Please review the following plan we discussed and let me know if I can assist you in the future.   Screening recommendations/referrals: Colonoscopy: 11/2011 Mammogram: 11/2018 Bone Density: 09/2017 Recommended yearly ophthalmology/optometry visit for glaucoma screening and checkup Recommended yearly dental visit for hygiene and checkup  Vaccinations: Influenza vaccine: allergy Pneumococcal vaccine: due Tdap vaccine: 04/2018 Shingles vaccine: discussed    Advanced directives: Advance directive discussed with you today.  Conditions/risks identified: obesity, smoking  Next appointment:   Preventive Care 40-64 Years, Female Preventive care refers to lifestyle choices and visits with your health care provider that can promote health and wellness. What does preventive care include?  A yearly physical exam. This is also called an annual well check.  Dental exams once or twice a year.  Routine eye exams. Ask your health care provider how often you should have your eyes checked.  Personal lifestyle choices, including:  Daily care of your teeth and gums.  Regular physical activity.  Eating a healthy diet.  Avoiding tobacco and drug use.  Limiting alcohol use.  Practicing safe sex.  Taking low-dose aspirin daily starting at age 11.  Taking vitamin and mineral supplements as recommended by your health care provider. What happens during an annual well check? The services and screenings done by your health care provider during your annual well check will depend on your age, overall health, lifestyle risk factors, and family history of disease. Counseling  Your health care provider may ask you questions about your:  Alcohol use.  Tobacco use.  Drug use.  Emotional well-being.  Home and relationship well-being.  Sexual  activity.  Eating habits.  Work and work Statistician.  Method of birth control.  Menstrual cycle.  Pregnancy history. Screening  You may have the following tests or measurements:  Height, weight, and BMI.  Blood pressure.  Lipid and cholesterol levels. These may be checked every 5 years, or more frequently if you are over 72 years old.  Skin check.  Lung cancer screening. You may have this screening every year starting at age 36 if you have a 30-pack-year history of smoking and currently smoke or have quit within the past 15 years.  Fecal occult blood test (FOBT) of the stool. You may have this test every year starting at age 38.  Flexible sigmoidoscopy or colonoscopy. You may have a sigmoidoscopy every 5 years or a colonoscopy every 10 years starting at age 7.  Hepatitis C blood test.  Hepatitis B blood test.  Sexually transmitted disease (STD) testing.  Diabetes screening. This is done by checking your blood sugar (glucose) after you have not eaten for a while (fasting). You may have this done every 1-3 years.  Mammogram. This may be done every 1-2 years. Talk to your health care provider about when you should start having regular mammograms. This may depend on whether you have a family history of breast cancer.  BRCA-related cancer screening. This may be done if you have a family history of breast, ovarian, tubal, or peritoneal cancers.  Pelvic exam and Pap test. This may be done every 3 years starting at age 65. Starting at age 55, this may be done every 5 years if you have a Pap test in combination with an HPV test.  Bone density scan. This is done to screen for osteoporosis. You may have this scan if you  are at high risk for osteoporosis. Discuss your test results, treatment options, and if necessary, the need for more tests with your health care provider. Vaccines  Your health care provider may recommend certain vaccines, such as:  Influenza vaccine. This is  recommended every year.  Tetanus, diphtheria, and acellular pertussis (Tdap, Td) vaccine. You may need a Td booster every 10 years.  Zoster vaccine. You may need this after age 21.  Pneumococcal 13-valent conjugate (PCV13) vaccine. You may need this if you have certain conditions and were not previously vaccinated.  Pneumococcal polysaccharide (PPSV23) vaccine. You may need one or two doses if you smoke cigarettes or if you have certain conditions. Talk to your health care provider about which screenings and vaccines you need and how often you need them. This information is not intended to replace advice given to you by your health care provider. Make sure you discuss any questions you have with your health care provider. Document Released: 11/09/2015 Document Revised: 07/02/2016 Document Reviewed: 08/14/2015 Elsevier Interactive Patient Education  2017 [employer] Prevention in the Home Falls can cause injuries. They can happen to people of all ages. There are many things you can do to make your home safe and to help prevent falls. What can I do on the outside of my home?  Regularly fix the edges of walkways and driveways and fix any cracks.  Remove anything that might make you trip as you walk through a door, such as a raised step or threshold.  Trim any bushes or trees on the path to your home.  Use bright outdoor lighting.  Clear any walking paths of anything that might make someone trip, such as rocks or tools.  Regularly check to see if handrails are loose or broken. Make sure that both sides of any steps have handrails.  Any raised decks and porches should have guardrails on the edges.  Have any leaves, snow, or ice cleared regularly.  Use sand or salt on walking paths during winter.  Clean up any spills in your garage right away. This includes oil or grease spills. What can I do in the bathroom?  Use night lights.  Install grab bars by the toilet and in  the tub and shower. Do not use towel bars as grab bars.  Use non-skid mats or decals in the tub or shower.  If you need to sit down in the shower, use a plastic, non-slip stool.  Keep the floor dry. Clean up any water that spills on the floor as soon as it happens.  Remove soap buildup in the tub or shower regularly.  Attach bath mats securely with double-sided non-slip rug tape.  Do not have throw rugs and other things on the floor that can make you trip. What can I do in the bedroom?  Use night lights.  Make sure that you have a light by your bed that is easy to reach.  Do not use any sheets or blankets that are too big for your bed. They should not hang down onto the floor.  Have a firm chair that has side arms. You can use this for support while you get dressed.  Do not have throw rugs and other things on the floor that can make you trip. What can I do in the kitchen?  Clean up any spills right away.  Avoid walking on wet floors.  Keep items that you use a lot in easy-to-reach places.  If you need  to reach something above you, use a strong step stool that has a grab bar.  Keep electrical cords out of the way.  Do not use floor polish or wax that makes floors slippery. If you must use wax, use non-skid floor wax.  Do not have throw rugs and other things on the floor that can make you trip. What can I do with my stairs?  Do not leave any items on the stairs.  Make sure that there are handrails on both sides of the stairs and use them. Fix handrails that are broken or loose. Make sure that handrails are as long as the stairways.  Check any carpeting to make sure that it is firmly attached to the stairs. Fix any carpet that is loose or worn.  Avoid having throw rugs at the top or bottom of the stairs. If you do have throw rugs, attach them to the floor with carpet tape.  Make sure that you have a light switch at the top of the stairs and the bottom of the stairs. If  you do not have them, ask someone to add them for you. What else can I do to help prevent falls?  Wear shoes that:  Do not have high heels.  Have rubber bottoms.  Are comfortable and fit you well.  Are closed at the toe. Do not wear sandals.  If you use a stepladder:  Make sure that it is fully opened. Do not climb a closed stepladder.  Make sure that both sides of the stepladder are locked into place.  Ask someone to hold it for you, if possible.  Clearly mark and make sure that you can see:  Any grab bars or handrails.  First and last steps.  Where the edge of each step is.  Use tools that help you move around (mobility aids) if they are needed. These include:  Canes.  Walkers.  Scooters.  Crutches.  Turn on the lights when you go into a dark area. Replace any light bulbs as soon as they burn out.  Set up your furniture so you have a clear path. Avoid moving your furniture around.  If any of your floors are uneven, fix them.  If there are any pets around you, be aware of where they are.  Review your medicines with your doctor. Some medicines can make you feel dizzy. This can increase your chance of falling. Ask your doctor what other things that you can do to help prevent falls. This information is not intended to replace advice given to you by your health care provider. Make sure you discuss any questions you have with your health care provider. Document Released: 08/09/2009 Document Revised: 03/20/2016 Document Reviewed: 11/17/2014 Elsevier Interactive Patient Education  2017 [employer].

## 2019-10-26 ENCOUNTER — Encounter: Payer: Self-pay | Admitting: Nurse Practitioner

## 2019-10-31 DIAGNOSIS — M5410 Radiculopathy, site unspecified: Secondary | ICD-10-CM | POA: Diagnosis not present

## 2019-10-31 DIAGNOSIS — B029 Zoster without complications: Secondary | ICD-10-CM | POA: Diagnosis not present

## 2019-11-01 ENCOUNTER — Ambulatory Visit: Payer: Medicare Other | Admitting: Nurse Practitioner

## 2019-11-07 ENCOUNTER — Encounter: Payer: Self-pay | Admitting: Nurse Practitioner

## 2019-11-07 ENCOUNTER — Other Ambulatory Visit: Payer: Self-pay | Admitting: Internal Medicine

## 2019-11-07 ENCOUNTER — Ambulatory Visit (INDEPENDENT_AMBULATORY_CARE_PROVIDER_SITE_OTHER): Payer: Medicare Other | Admitting: Nurse Practitioner

## 2019-11-07 ENCOUNTER — Other Ambulatory Visit: Payer: Self-pay

## 2019-11-07 VITALS — BP 124/80 | HR 66 | Temp 98.7°F | Ht 67.8 in | Wt 219.0 lb

## 2019-11-07 DIAGNOSIS — R300 Dysuria: Secondary | ICD-10-CM | POA: Diagnosis not present

## 2019-11-07 DIAGNOSIS — R21 Rash and other nonspecific skin eruption: Secondary | ICD-10-CM

## 2019-11-07 DIAGNOSIS — E7800 Pure hypercholesterolemia, unspecified: Secondary | ICD-10-CM | POA: Diagnosis not present

## 2019-11-07 DIAGNOSIS — Z720 Tobacco use: Secondary | ICD-10-CM

## 2019-11-07 DIAGNOSIS — Z1231 Encounter for screening mammogram for malignant neoplasm of breast: Secondary | ICD-10-CM

## 2019-11-07 DIAGNOSIS — M2550 Pain in unspecified joint: Secondary | ICD-10-CM | POA: Diagnosis not present

## 2019-11-07 MED ORDER — CHANTIX STARTING MONTH PAK 0.5 MG X 11 & 1 MG X 42 PO TABS: ORAL_TABLET | ORAL | 0 refills | Status: DC

## 2019-11-07 MED ORDER — PREGABALIN 50 MG PO CAPS: 50.0000 mg | ORAL_CAPSULE | Freq: Every day | ORAL | 3 refills | Status: DC

## 2019-11-07 MED ORDER — VALACYCLOVIR HCL 500 MG PO TABS: 500.0000 mg | ORAL_TABLET | Freq: Every day | ORAL | 2 refills | Status: AC

## 2019-11-07 NOTE — Progress Notes (Addendum)
This visit occurred during the SARS-CoV-2 public health emergency.  Safety protocols were in place, including screening questions prior to the visit, additional usage of staff PPE, and extensive cleaning of exam room while observing appropriate contact time as indicated for disinfecting solutions.  Subjective:     Patient ID: Margaret Webster , female    DOB: 01-Oct-1961 , 59 y.o.   MRN: [id number]   Chief Complaint  Patient presents with  . Rash    HPI  Rash This is a recurrent problem. The current episode started more than 1 month ago. The problem is unchanged. The affected locations include the left buttock (left lower back). The rash is characterized by burning. She was exposed to nothing. Pertinent negatives include no cough, fatigue or shortness of breath. Past treatments include topical steroids.     Past Medical History:  Diagnosis Date  . Arthritis    bil shoulders  . Bronchitis   . Chronic back pain   . Diabetes mellitus    stopped Mrtformin 1 mo ago, made her "sick"  . Hypertension   . Hypothyroidism   . Thyroid disease   . Trigger thumb of right hand      Family History  Problem Relation Age of Onset  . Diabetes Mother   . Cystic fibrosis Sister   . Cancer Sister        ?  Marland Kitchen Heart disease Maternal Grandmother   . Clotting disorder Maternal Grandfather      Current Outpatient Medications:  .  diclofenac sodium (VOLTAREN) 1 % GEL, Apply 2 g topically 4 (four) times daily., Disp: 100 g, Rfl: 2 .  lidocaine (LIDODERM) 5 %, Place 1 patch onto the skin daily. Remove & Discard patch within 12 hours or as directed by MD, Disp: 30 patch, Rfl: 0 .  mometasone (NASONEX) 50 MCG/ACT nasal spray, Place 2 sprays into the nose daily., Disp: 17 g, Rfl: 2 .  polyvinyl alcohol (LIQUIFILM TEARS) 1.4 % ophthalmic solution, Place 1 drop into both eyes as needed for dry eyes., Disp: , Rfl:  .  traZODone (DESYREL) 50 MG tablet, Take 1 tablet (50 mg total) by mouth at bedtime., Disp:  30 tablet, Rfl: 2 .  valACYclovir (VALTREX) 500 MG tablet, Take 1 tablet (500 mg total) by mouth 2 (two) times daily., Disp: 20 tablet, Rfl: 0 .  nicotine (NICODERM CQ - DOSED IN MG/24 HOURS) 21 mg/24hr patch, Place 1 patch (21 mg total) onto the skin daily. (Patient not taking: Reported on 06/08/2019), Disp: 30 patch, Rfl: 1   Allergies  Allergen Reactions  . Sulfa Antibiotics Other (See Comments)    Hard to breathe  . Aspirin Other (See Comments)    Per pcp  . Ibuprofen Other (See Comments)    Stomach upset  . Polytrim [Polymyxin B-Trimethoprim] Itching and Swelling    redness  . Influenza Vaccines Hives  . Lipitor [Atorvastatin Calcium] Other (See Comments)    Muscle ache   . Naproxen Nausea And Vomiting     Review of Systems  Constitutional: Negative.  Negative for fatigue.  Respiratory: Negative.  Negative for cough and shortness of breath.   Cardiovascular: Negative.  Negative for chest pain, palpitations and leg swelling.  Skin: Positive for rash (to left buttocks and low back).  Neurological: Negative for dizziness and headaches.  Psychiatric/Behavioral: Negative.      Today's Vitals   11/07/19 1553  BP: 124/80  Pulse: 66  Temp: 98.7 F (37.1 C)  TempSrc: Oral  Weight: 219 lb (99.3 kg)  Height: 5' 7.8" (1.722 m)  PainSc: 8   PainLoc: Shoulder   Body mass index is 33.5 kg/m.   Objective:  Physical Exam Constitutional:      Appearance: Normal appearance.  Cardiovascular:     Rate and Rhythm: Normal rate and regular rhythm.  Skin:    Findings: Rash (herpetic type rash to left buttocks) present.  Neurological:     General: No focal deficit present.     Mental Status: She is alert and oriented to person, place, and time.     Cranial Nerves: No cranial nerve deficit.  Psychiatric:        Mood and Affect: Mood normal.        Behavior: Behavior normal.        Thought Content: Thought content normal.        Judgment: Judgment normal.         Assessment  And Plan:     1. Multiple joint pain  Persistent joint pain will check for autoimmune panel and restart her lyrica as she was on this previously - Autoimmune Profile; Future - pregabalin (LYRICA) 50 MG capsule; Take 1 capsule (50 mg total) by mouth daily.  Dispense: 30 capsule; Refill: 3  2. Rash and nonspecific skin eruption  Herpetic type rash previously with sharp shooting pain , will provide daily dose of valtrex - valACYclovir (VALTREX) 500 MG tablet; Take 1 tablet (500 mg total) by mouth daily.  Dispense: 30 tablet; Refill: 2  3. Elevated cholesterol  Chronic, will check levels to indicate if continues to have atherosclerotic disease  No current medications - Lipid Profile  4. Tobacco abuse Ready to quit: Yes Counseling given: Yes Comment: 4 cig a day, not interested in chantix  Smoking cessation instruction/counseling given:  counseled patient on the dangers of tobacco use, advised patient to stop smoking, and reviewed strategies to maximize success - varenicline (CHANTIX STARTING MONTH PAK) 0.5 MG X 11 & 1 MG X 42 tablet; Take one 0.5 mg tablet by mouth once daily for 3 days, then increase to one 0.5 mg tablet twice daily for 4 days, then increase to one 1 mg tablet twice daily.  Dispense: 53 tablet; Refill: 0  5. Dysuria  Encouraged to increase water intake.   [name], FNP    THE PATIENT IS ENCOURAGED TO PRACTICE SOCIAL DISTANCING DUE TO THE COVID-19 PANDEMIC.

## 2019-11-08 ENCOUNTER — Other Ambulatory Visit: Payer: Medicare Other

## 2019-11-08 DIAGNOSIS — M2550 Pain in unspecified joint: Secondary | ICD-10-CM | POA: Diagnosis not present

## 2019-11-08 DIAGNOSIS — Z1231 Encounter for screening mammogram for malignant neoplasm of breast: Secondary | ICD-10-CM

## 2019-11-09 LAB — AUTOIMMUNE PROFILE
Anti Nuclear Antibody (ANA): NEGATIVE
Complement C3, Serum: 135 mg/dL (ref 82–167)
dsDNA Ab: 3 IU/mL (ref 0–9)

## 2019-11-10 ENCOUNTER — Other Ambulatory Visit: Payer: Self-pay | Admitting: Nurse Practitioner

## 2019-11-10 DIAGNOSIS — M2550 Pain in unspecified joint: Secondary | ICD-10-CM

## 2019-11-21 ENCOUNTER — Telehealth: Payer: Self-pay

## 2019-11-22 ENCOUNTER — Telehealth: Payer: Self-pay

## 2019-11-23 ENCOUNTER — Ambulatory Visit: Payer: Self-pay

## 2019-11-23 DIAGNOSIS — M1991 Primary osteoarthritis, unspecified site: Secondary | ICD-10-CM

## 2019-11-23 DIAGNOSIS — N1831 Chronic kidney disease, stage 3a: Secondary | ICD-10-CM

## 2019-11-23 DIAGNOSIS — R7303 Prediabetes: Secondary | ICD-10-CM

## 2019-11-23 DIAGNOSIS — I10 Essential (primary) hypertension: Secondary | ICD-10-CM

## 2019-11-23 NOTE — Chronic Care Management (AMB) (Signed)
  Chronic Care Management   Outreach Note  11/23/2019 Name: Margaret Webster MRN: [id number] DOB: 10/12/1961  Referred by: [name], FNP Reason for referral : Chronic Care Management (1st attempt new CCM RNCM Telephone Outreach )   An unsuccessful telephone outreach was attempted today. The patient was referred to the case management team by [name] FNP for assistance with care management and care coordination.   Follow Up Plan: A HIPPA compliant phone message was left for the patient providing contact information and requesting a return call.  Telephone follow up appointment with care management team member scheduled for: 12/13/19  Barb Merino, RN, BSN, CCM Care Management Coordinator [place] [facility]  Direct Phone: [phone]

## 2019-11-25 ENCOUNTER — Telehealth: Payer: Self-pay

## 2019-11-25 ENCOUNTER — Other Ambulatory Visit: Payer: Self-pay

## 2019-11-25 ENCOUNTER — Ambulatory Visit: Payer: Self-pay

## 2019-11-25 DIAGNOSIS — I10 Essential (primary) hypertension: Secondary | ICD-10-CM

## 2019-11-25 DIAGNOSIS — M1991 Primary osteoarthritis, unspecified site: Secondary | ICD-10-CM

## 2019-11-25 DIAGNOSIS — N1831 Chronic kidney disease, stage 3a: Secondary | ICD-10-CM

## 2019-11-25 DIAGNOSIS — R7303 Prediabetes: Secondary | ICD-10-CM

## 2019-11-25 NOTE — Chronic Care Management (AMB) (Signed)
  Chronic Care Management   Outreach Note  11/25/2019 Name: Margaret Webster MRN: [id number] DOB: 03/25/1961  Referred by: [name], FNP Reason for referral : Chronic Care Management (#2 attempt New CCM RNCM Follow up )  Inbound call received from patient with voice message requesting a return phone call.  A second unsuccessful telephone outreach was attempted today. The patient was referred to the case management team for assistance with care management and care coordination.     Follow Up Plan: A HIPPA compliant phone message was left for the patient providing contact information and requesting a return call.  Telephone follow up appointment with care management team member scheduled for: 12/13/19   Barb Merino, RN, BSN, CCM Care Management Coordinator [place] [facility]  Direct Phone: [phone]

## 2019-11-28 DIAGNOSIS — M25511 Pain in right shoulder: Secondary | ICD-10-CM | POA: Diagnosis not present

## 2019-11-28 DIAGNOSIS — M25512 Pain in left shoulder: Secondary | ICD-10-CM | POA: Diagnosis not present

## 2019-11-29 ENCOUNTER — Telehealth: Payer: Self-pay

## 2019-11-29 DIAGNOSIS — M25551 Pain in right hip: Secondary | ICD-10-CM | POA: Diagnosis not present

## 2019-11-29 DIAGNOSIS — M5136 Other intervertebral disc degeneration, lumbar region: Secondary | ICD-10-CM | POA: Diagnosis not present

## 2019-11-29 DIAGNOSIS — M545 Low back pain: Secondary | ICD-10-CM | POA: Diagnosis not present

## 2019-11-29 NOTE — Telephone Encounter (Signed)
[facility] called stating patient is in their office and had told them that she recently had a shingles outbreak and they would like to know if it has been long enough that she can receive the cortisone injection in her hip. [phone]  I RETURNED THEIR CALL AND ADVISED THEM THAT SHE SHOULD WAIT ANOTHER MONTH BEFORE GETTING THE INJECTION PER JM FNP-BC. YRL,RMA

## 2019-12-05 DIAGNOSIS — M25512 Pain in left shoulder: Secondary | ICD-10-CM | POA: Diagnosis not present

## 2019-12-05 DIAGNOSIS — M25511 Pain in right shoulder: Secondary | ICD-10-CM | POA: Diagnosis not present

## 2019-12-12 DIAGNOSIS — M7542 Impingement syndrome of left shoulder: Secondary | ICD-10-CM | POA: Diagnosis not present

## 2019-12-12 DIAGNOSIS — M7541 Impingement syndrome of right shoulder: Secondary | ICD-10-CM | POA: Diagnosis not present

## 2019-12-12 DIAGNOSIS — M25512 Pain in left shoulder: Secondary | ICD-10-CM | POA: Diagnosis not present

## 2019-12-13 ENCOUNTER — Telehealth: Payer: Self-pay

## 2019-12-16 ENCOUNTER — Other Ambulatory Visit: Payer: Medicare Other

## 2019-12-20 ENCOUNTER — Ambulatory Visit
Admission: RE | Admit: 2019-12-20 | Discharge: 2019-12-20 | Disposition: A | Discharge status: Home / Self Care | Payer: Medicare Other | Source: Ambulatory Visit | Attending: Internal Medicine | Admitting: Internal Medicine

## 2019-12-20 ENCOUNTER — Other Ambulatory Visit: Payer: Self-pay

## 2019-12-20 DIAGNOSIS — Z1231 Encounter for screening mammogram for malignant neoplasm of breast: Secondary | ICD-10-CM | POA: Diagnosis not present

## 2020-01-09 ENCOUNTER — Other Ambulatory Visit: Payer: Medicare Other

## 2020-01-17 ENCOUNTER — Ambulatory Visit
Admission: RE | Admit: 2020-01-17 | Discharge: 2020-01-17 | Disposition: A | Discharge status: Home / Self Care | Payer: Medicare Other | Source: Ambulatory Visit | Attending: Nurse Practitioner | Admitting: Nurse Practitioner

## 2020-01-17 ENCOUNTER — Other Ambulatory Visit: Payer: Self-pay

## 2020-01-17 ENCOUNTER — Ambulatory Visit: Payer: Self-pay

## 2020-01-17 ENCOUNTER — Telehealth: Payer: Self-pay

## 2020-01-17 DIAGNOSIS — N1831 Chronic kidney disease, stage 3a: Secondary | ICD-10-CM

## 2020-01-17 DIAGNOSIS — I10 Essential (primary) hypertension: Secondary | ICD-10-CM

## 2020-01-17 DIAGNOSIS — R7303 Prediabetes: Secondary | ICD-10-CM

## 2020-01-17 DIAGNOSIS — M1991 Primary osteoarthritis, unspecified site: Secondary | ICD-10-CM

## 2020-01-17 DIAGNOSIS — E2839 Other primary ovarian failure: Secondary | ICD-10-CM

## 2020-01-17 DIAGNOSIS — Z780 Asymptomatic menopausal state: Secondary | ICD-10-CM | POA: Diagnosis not present

## 2020-01-18 NOTE — Chronic Care Management (AMB) (Signed)
  Chronic Care Management   Outreach Note  01/18/2020 Name: Margaret Webster MRN: [id number] DOB: 13-Apr-1961  Referred by: [name], FNP Reason for referral : Chronic Care Management (RQ #3 Initial Call-HTN, Osteoarthritis, Prediabetes, CKDIII)   Third unsuccessful telephone outreach was attempted today. The patient was referred to the case management team for assistance with care management and care coordination. The patient's primary care provider has been notified of our unsuccessful attempts to make or maintain contact with the patient. The care management team is pleased to engage with this patient at any time in the future should he/she be interested in assistance from the care management team.   Follow Up Plan: The care management team is available to follow up with the patient after provider conversation with the patient regarding recommendation for care management engagement and subsequent re-referral to the care management team.   Barb Merino, RN, BSN, CCM Care Management Coordinator [place] [facility]  Direct Phone: [phone]

## 2020-01-25 ENCOUNTER — Ambulatory Visit (INDEPENDENT_AMBULATORY_CARE_PROVIDER_SITE_OTHER): Payer: Medicare Other | Admitting: Nurse Practitioner

## 2020-01-25 ENCOUNTER — Other Ambulatory Visit: Payer: Self-pay

## 2020-01-25 ENCOUNTER — Ambulatory Visit (INDEPENDENT_AMBULATORY_CARE_PROVIDER_SITE_OTHER): Payer: Medicare Other

## 2020-01-25 VITALS — BP 118/72 | HR 65 | Temp 98.3°F | Ht 67.6 in | Wt 224.0 lb

## 2020-01-25 VITALS — BP 118/72 | HR 65 | Temp 98.3°F | Ht 67.6 in | Wt 224.2 lb

## 2020-01-25 DIAGNOSIS — M2550 Pain in unspecified joint: Secondary | ICD-10-CM

## 2020-01-25 DIAGNOSIS — Z0000 Encounter for general adult medical examination without abnormal findings: Secondary | ICD-10-CM

## 2020-01-25 DIAGNOSIS — I10 Essential (primary) hypertension: Secondary | ICD-10-CM | POA: Diagnosis not present

## 2020-01-25 DIAGNOSIS — J301 Allergic rhinitis due to pollen: Secondary | ICD-10-CM | POA: Diagnosis not present

## 2020-01-25 DIAGNOSIS — R7303 Prediabetes: Secondary | ICD-10-CM

## 2020-01-25 MED ORDER — PREGABALIN 50 MG PO CAPS: 50.0000 mg | ORAL_CAPSULE | Freq: Every day | ORAL | 1 refills | Status: DC

## 2020-01-25 MED ORDER — OZEMPIC (0.25 OR 0.5 MG/DOSE) 2 MG/1.5ML ~~LOC~~ SOPN: 0.5000 mg | PEN_INJECTOR | SUBCUTANEOUS | 2 refills | Status: DC

## 2020-01-25 MED ORDER — MOMETASONE FUROATE 50 MCG/ACT NA SUSP: 2.0000 | Freq: Every day | NASAL | 2 refills | Status: DC

## 2020-01-25 MED ORDER — DICLOFENAC SODIUM 1 % EX GEL: 2.0000 g | Freq: Four times a day (QID) | CUTANEOUS | 5 refills | Status: DC

## 2020-01-25 NOTE — Patient Instructions (Signed)
[name] , Thank you for taking time to come for your Medicare Wellness Visit. I appreciate your ongoing commitment to your health goals. Please review the following plan we discussed and let me know if I can assist you in the future.   Screening recommendations/referrals: Colonoscopy: 11/2011 Mammogram: 11/2019 Bone Density: 12/2019 Recommended yearly ophthalmology/optometry visit for glaucoma screening and checkup Recommended yearly dental visit for hygiene and checkup  Vaccinations: Influenza vaccine: allergy Pneumococcal vaccine: postponed due to covid vaccine Tdap vaccine: 04/2018 Shingles vaccine: discussed    Advanced directives: Advance directive discussed with you today. Even though you declined this today please call our office should you change your mind and we can give you the proper paperwork for you to fill out.  Conditions/risks identified: obesity  Next appointment: 03/07/2020 at 9:00  Preventive Care 40-64 Years, Female Preventive care refers to lifestyle choices and visits with your health care provider that can promote health and wellness. What does preventive care include?  A yearly physical exam. This is also called an annual well check.  Dental exams once or twice a year.  Routine eye exams. Ask your health care provider how often you should have your eyes checked.  Personal lifestyle choices, including:  Daily care of your teeth and gums.  Regular physical activity.  Eating a healthy diet.  Avoiding tobacco and drug use.  Limiting alcohol use.  Practicing safe sex.  Taking low-dose aspirin daily starting at age 73.  Taking vitamin and mineral supplements as recommended by your health care provider. What happens during an annual well check? The services and screenings done by your health care provider during your annual well check will depend on your age, overall health, lifestyle risk factors, and family history of disease. Counseling  Your  health care provider may ask you questions about your:  Alcohol use.  Tobacco use.  Drug use.  Emotional well-being.  Home and relationship well-being.  Sexual activity.  Eating habits.  Work and work Statistician.  Method of birth control.  Menstrual cycle.  Pregnancy history. Screening  You may have the following tests or measurements:  Height, weight, and BMI.  Blood pressure.  Lipid and cholesterol levels. These may be checked every 5 years, or more frequently if you are over 46 years old.  Skin check.  Lung cancer screening. You may have this screening every year starting at age 34 if you have a 30-pack-year history of smoking and currently smoke or have quit within the past 15 years.  Fecal occult blood test (FOBT) of the stool. You may have this test every year starting at age 57.  Flexible sigmoidoscopy or colonoscopy. You may have a sigmoidoscopy every 5 years or a colonoscopy every 10 years starting at age 26.  Hepatitis C blood test.  Hepatitis B blood test.  Sexually transmitted disease (STD) testing.  Diabetes screening. This is done by checking your blood sugar (glucose) after you have not eaten for a while (fasting). You may have this done every 1-3 years.  Mammogram. This may be done every 1-2 years. Talk to your health care provider about when you should start having regular mammograms. This may depend on whether you have a family history of breast cancer.  BRCA-related cancer screening. This may be done if you have a family history of breast, ovarian, tubal, or peritoneal cancers.  Pelvic exam and Pap test. This may be done every 3 years starting at age 47. Starting at age 70, this may be done  every 5 years if you have a Pap test in combination with an HPV test.  Bone density scan. This is done to screen for osteoporosis. You may have this scan if you are at high risk for osteoporosis. Discuss your test results, treatment options, and if  necessary, the need for more tests with your health care provider. Vaccines  Your health care provider may recommend certain vaccines, such as:  Influenza vaccine. This is recommended every year.  Tetanus, diphtheria, and acellular pertussis (Tdap, Td) vaccine. You may need a Td booster every 10 years.  Zoster vaccine. You may need this after age 75.  Pneumococcal 13-valent conjugate (PCV13) vaccine. You may need this if you have certain conditions and were not previously vaccinated.  Pneumococcal polysaccharide (PPSV23) vaccine. You may need one or two doses if you smoke cigarettes or if you have certain conditions. Talk to your health care provider about which screenings and vaccines you need and how often you need them. This information is not intended to replace advice given to you by your health care provider. Make sure you discuss any questions you have with your health care provider. Document Released: 11/09/2015 Document Revised: 07/02/2016 Document Reviewed: 08/14/2015 Elsevier Interactive Patient Education  2017 [place] Prevention in the Home Falls can cause injuries. They can happen to people of all ages. There are many things you can do to make your home safe and to help prevent falls. What can I do on the outside of my home?  Regularly fix the edges of walkways and driveways and fix any cracks.  Remove anything that might make you trip as you walk through a door, such as a raised step or threshold.  Trim any bushes or trees on the path to your home.  Use bright outdoor lighting.  Clear any walking paths of anything that might make someone trip, such as rocks or tools.  Regularly check to see if handrails are loose or broken. Make sure that both sides of any steps have handrails.  Any raised decks and porches should have guardrails on the edges.  Have any leaves, snow, or ice cleared regularly.  Use sand or salt on walking paths during  winter.  Clean up any spills in your garage right away. This includes oil or grease spills. What can I do in the bathroom?  Use night lights.  Install grab bars by the toilet and in the tub and shower. Do not use towel bars as grab bars.  Use non-skid mats or decals in the tub or shower.  If you need to sit down in the shower, use a plastic, non-slip stool.  Keep the floor dry. Clean up any water that spills on the floor as soon as it happens.  Remove soap buildup in the tub or shower regularly.  Attach bath mats securely with double-sided non-slip rug tape.  Do not have throw rugs and other things on the floor that can make you trip. What can I do in the bedroom?  Use night lights.  Make sure that you have a light by your bed that is easy to reach.  Do not use any sheets or blankets that are too big for your bed. They should not hang down onto the floor.  Have a firm chair that has side arms. You can use this for support while you get dressed.  Do not have throw rugs and other things on the floor that can make you trip. What can  I do in the kitchen?  Clean up any spills right away.  Avoid walking on wet floors.  Keep items that you use a lot in easy-to-reach places.  If you need to reach something above you, use a strong step stool that has a grab bar.  Keep electrical cords out of the way.  Do not use floor polish or wax that makes floors slippery. If you must use wax, use non-skid floor wax.  Do not have throw rugs and other things on the floor that can make you trip. What can I do with my stairs?  Do not leave any items on the stairs.  Make sure that there are handrails on both sides of the stairs and use them. Fix handrails that are broken or loose. Make sure that handrails are as long as the stairways.  Check any carpeting to make sure that it is firmly attached to the stairs. Fix any carpet that is loose or worn.  Avoid having throw rugs at the top or  bottom of the stairs. If you do have throw rugs, attach them to the floor with carpet tape.  Make sure that you have a light switch at the top of the stairs and the bottom of the stairs. If you do not have them, ask someone to add them for you. What else can I do to help prevent falls?  Wear shoes that:  Do not have high heels.  Have rubber bottoms.  Are comfortable and fit you well.  Are closed at the toe. Do not wear sandals.  If you use a stepladder:  Make sure that it is fully opened. Do not climb a closed stepladder.  Make sure that both sides of the stepladder are locked into place.  Ask someone to hold it for you, if possible.  Clearly mark and make sure that you can see:  Any grab bars or handrails.  First and last steps.  Where the edge of each step is.  Use tools that help you move around (mobility aids) if they are needed. These include:  Canes.  Walkers.  Scooters.  Crutches.  Turn on the lights when you go into a dark area. Replace any light bulbs as soon as they burn out.  Set up your furniture so you have a clear path. Avoid moving your furniture around.  If any of your floors are uneven, fix them.  If there are any pets around you, be aware of where they are.  Review your medicines with your doctor. Some medicines can make you feel dizzy. This can increase your chance of falling. Ask your doctor what other things that you can do to help prevent falls. This information is not intended to replace advice given to you by your health care provider. Make sure you discuss any questions you have with your health care provider. Document Released: 08/09/2009 Document Revised: 03/20/2016 Document Reviewed: 11/17/2014 Elsevier Interactive Patient Education  2017 [employer].

## 2020-01-25 NOTE — Progress Notes (Signed)
This visit occurred during the SARS-CoV-2 public health emergency.  Safety protocols were in place, including screening questions prior to the visit, additional usage of staff PPE, and extensive cleaning of exam room while observing appropriate contact time as indicated for disinfecting solutions.  Subjective:   Margaret Webster is a 59 y.o. female who presents for Medicare Annual (Subsequent) preventive examination.  Review of Systems:  n/a Cardiac Risk Factors include: diabetes mellitus;hypertension;obesity (BMI >30kg/m2);sedentary lifestyle     Objective:     Vitals: BP 118/72 (BP Location: Left Arm, Patient Position: Sitting, Cuff Size: Normal)   Pulse 65   Temp 98.3 F (36.8 C) (Oral)   Ht 5' 7.6" (1.717 m)   Wt 224 lb (101.6 kg)   BMI 34.46 kg/m   Body mass index is 34.46 kg/m.  Advanced Directives 01/25/2020 10/25/2019 08/25/2019 06/08/2019 06/04/2019 02/18/2019 10/19/2018  Does Patient Have a Medical Advance Directive? No No No No No No No  Would patient like information on creating a medical advance directive? No - Patient declined - No - Patient declined No - Patient declined - - No - Patient declined    Tobacco Social History   Tobacco Use  Smoking Status Current Every Day Smoker  . Packs/day: 0.25  . Types: Cigarettes  Smokeless Tobacco Never Used  Tobacco Comment   4 cig a day, not interested in chantix     Ready to quit: Not Answered Counseling given: Not Answered Comment: 4 cig a day, not interested in chantix   Clinical Intake:  Pre-visit preparation completed: Yes  Pain : 0-10 Pain Score: 8  Pain Type: Chronic pain Pain Location: Shoulder Pain Orientation: Right, Left Pain Descriptors / Indicators: Aching Pain Onset: More than a month ago Pain Frequency: Intermittent Pain Relieving Factors: rest helps  Pain Relieving Factors: rest helps  Nutritional Status: BMI > 30  Obese Nutritional Risks: None Diabetes: Yes  How often do you need to  have someone help you when you read instructions, pamphlets, or other written materials from your doctor or pharmacy?: 1 - Never What is the last grade level you completed in school?: 12th grade  Interpreter Needed?: No  Information entered by :: NAllen LPN  Past Medical History:  Diagnosis Date  . Arthritis    bil shoulders  . Bronchitis   . Chronic back pain   . Diabetes mellitus    stopped Mrtformin 1 mo ago, made her "sick"  . Hypertension   . Hypothyroidism   . Thyroid disease   . Trigger thumb of right hand    Past Surgical History:  Procedure Laterality Date  . BACK SURGERY  1993  . CARPAL TUNNEL RELEASE  08/2015  . CARPAL TUNNEL RELEASE Left 09/10/2017   Procedure: CARPAL TUNNEL RELEASE;  Surgeon: Leanora Cover, MD;  Location: [facility];  Service: Orthopedics;  Laterality: Left;  . CERVICAL SPINE SURGERY Right 11/2012  . OOPHORECTOMY    . TRIGGER FINGER RELEASE Right 08/06/2017   Procedure: RIGHT THUMB TRIGGER RELEASE;  Surgeon: Leanora Cover, MD;  Location: [facility];  Service: Orthopedics;  Laterality: Right;   Family History  Problem Relation Age of Onset  . Diabetes Mother   . Cystic fibrosis Sister   . Cancer Sister        ?  Marland Kitchen Heart disease Maternal Grandmother   . Clotting disorder Maternal Grandfather    Social History   Socioeconomic History  . Marital status: Single    Spouse name: Not  on file  . Number of children: 1  . Years of education: Not on file  . Highest education level: Not on file  Occupational History  . Occupation: DISABLED    Employer: DISABLED  Tobacco Use  . Smoking status: Current Every Day Smoker    Packs/day: 0.25    Types: Cigarettes  . Smokeless tobacco: Never Used  . Tobacco comment: 4 cig a day, not interested in chantix  Substance and Sexual Activity  . Alcohol use: Yes    Comment: occas  . Drug use: Yes    Types: Marijuana  . Sexual activity: Yes    Partners: Male    Birth  control/protection: None  Other Topics Concern  . Not on file  Social History Narrative  . Not on file   Social Determinants of Health   Financial Resource Strain: Low Risk   . Difficulty of Paying Living Expenses: Not hard at all  Food Insecurity: No Food Insecurity  . Worried About Charity fundraiser in the Last Year: Never true  . Ran Out of Food in the Last Year: Never true  Transportation Needs: No Transportation Needs  . Lack of Transportation (Medical): No  . Lack of Transportation (Non-Medical): No  Physical Activity: Inactive  . Days of Exercise per Week: 0 days  . Minutes of Exercise per Session: 0 min  Stress: No Stress Concern Present  . Feeling of Stress : Not at all  Social Connections:   . Frequency of Communication with Friends and Family:   . Frequency of Social Gatherings with Friends and Family:   . Attends Religious Services:   . Active Member of Clubs or Organizations:   . Attends Archivist Meetings:   Marland Kitchen Marital Status:     Outpatient Encounter Medications as of 01/25/2020  Medication Sig  . lidocaine (LIDODERM) 5 % Place 1 patch onto the skin daily. Remove & Discard patch within 12 hours or as directed by MD  . mometasone (NASONEX) 50 MCG/ACT nasal spray Place 2 sprays into the nose daily.  . polyvinyl alcohol (LIQUIFILM TEARS) 1.4 % ophthalmic solution Place 1 drop into both eyes as needed for dry eyes.  . pregabalin (LYRICA) 50 MG capsule Take 1 capsule (50 mg total) by mouth daily.  . traZODone (DESYREL) 50 MG tablet Take 1 tablet (50 mg total) by mouth at bedtime.   No facility-administered encounter medications on file as of 01/25/2020.    Activities of Daily Living In your present state of health, do you have any difficulty performing the following activities: 01/25/2020 10/25/2019  Hearing? N N  Vision? Y Y  Comment trouble seeing at night blurry at times  Difficulty concentrating or making decisions? N Y  Comment - forgetfulness    Walking or climbing stairs? N Y  Comment - back hurts  Dressing or bathing? N N  Doing errands, shopping? N N  Preparing Food and eating ? N N  Using the Toilet? N N  In the past six months, have you accidently leaked urine? N N  Do you have problems with loss of bowel control? N N  Managing your Medications? N N  Managing your Finances? N N  Housekeeping or managing your Housekeeping? N N  Some recent data might be hidden    Patient Care Team: [name], FNP as PCP - General ([place]) Daneen Schick as Social Worker [place], Claudette Stapler, RN as Case Manager    Assessment:   This is a  routine wellness examination for [place].  Exercise Activities and Dietary recommendations Current Exercise Habits: The patient does not participate in regular exercise at present  Goals    . Increase physical activity     Would like to join a gym.      . Patient Stated     10/25/2019, wants to get healthy and get help from doctors that she needs    . Weight (lb) < 200 lb (90.7 kg)     01/25/2020, wants to weigh 197 pounds       Fall Risk Fall Risk  01/25/2020 11/07/2019 10/25/2019 09/19/2019 05/03/2019  Falls in the past year? 0 0 0 0 0  Number falls in past yr: - - - - -  Injury with Fall? - - - - -  Risk for fall due to : Medication side effect - Medication side effect - -  Follow up Falls evaluation completed;Education provided;Falls prevention discussed - Falls evaluation completed;Education provided;Falls prevention discussed - -   Is the patient's home free of loose throw rugs in walkways, pet beds, electrical cords, etc?   yes      Grab bars in the bathroom? no      Handrails on the stairs?   n/a      Adequate lighting?   yes  Timed Get Up and Go performed: n/a  Depression Screen PHQ 2/9 Scores 01/25/2020 11/07/2019 10/25/2019 09/19/2019  PHQ - 2 Score 0 0 0 6  PHQ- 9 Score 4 - 1 13  Exception Documentation Other- indicate reason in comment box - - -  Not completed  just completed with CMA - - -     Cognitive Function     6CIT Screen 01/25/2020 10/25/2019  What Year? 0 points 0 points  What month? 0 points 0 points  What time? 0 points 0 points  Count back from 20 0 points 0 points  Months in reverse 0 points 4 points  Repeat phrase 2 points 8 points  Total Score 2 12    Immunization History  Administered Date(s) Administered  . Tdap 05/27/2013, 03/21/2015, 05/10/2018    Qualifies for Shingles Vaccine? yes  Screening Tests Health Maintenance  Topic Date Due  . OPHTHALMOLOGY EXAM  12/22/2018  . INFLUENZA VACCINE  01/25/2020 (Originally 05/28/2019)  . PNEUMOCOCCAL POLYSACCHARIDE VACCINE AGE 78-64 HIGH RISK  01/24/2021 (Originally 03/13/1963)  . HEMOGLOBIN A1C  03/18/2020  . FOOT EXAM  09/18/2020  . URINE MICROALBUMIN  09/18/2020  . COLONOSCOPY  12/07/2021  . PAP SMEAR-Modifier  12/09/2021  . MAMMOGRAM  12/19/2021  . TETANUS/TDAP  05/10/2028  . Hepatitis C Screening  Completed  . HIV Screening  Completed    Cancer Screenings: Lung: Low Dose CT Chest recommended if Age 84-80 years, 30 pack-year currently smoking OR have quit w/in 15years. Patient does not qualify. Breast:  Up to date on Mammogram? Yes   Up to date of Bone Density/Dexa? Yes Colorectal: up to date  Additional Screenings: : Hepatitis C Screening: 10/11/2018     Plan:    Patient wants to get down to 197 pounds.   I have personally reviewed and noted the following in the patient's chart:   . Medical and social history . Use of alcohol, tobacco or illicit drugs  . Current medications and supplements . Functional ability and status . Nutritional status . Physical activity . Advanced directives . List of other physicians . Hospitalizations, surgeries, and ER visits in previous 12 months . Vitals . Screenings to include  cognitive, depression, and falls . Referrals and appointments  In addition, I have reviewed and discussed with patient certain preventive  protocols, quality metrics, and best practice recommendations. A written personalized care plan for preventive services as well as general preventive health recommendations were provided to patient.     Kellie Simmering, LPN  [id number]

## 2020-01-26 ENCOUNTER — Ambulatory Visit: Payer: Self-pay

## 2020-01-26 DIAGNOSIS — M1991 Primary osteoarthritis, unspecified site: Secondary | ICD-10-CM

## 2020-01-26 DIAGNOSIS — M25511 Pain in right shoulder: Secondary | ICD-10-CM

## 2020-01-26 NOTE — Chronic Care Management (AMB) (Signed)
  Chronic Care Management   Social Work Note  01/26/2020 Name: Margaret Webster MRN: [id number] DOB: 1961-08-05  SW placed a successful outbound call to the patient in response to a verbal request from provider [name], [place] who requested SW outreach the patient to assist with questions the patient has regarding health plan benefits.   SW determined the patient is concerned with co-pay amounts she is being charged at her orthopedic office. The patient reports she has dual coverage and he health plan has indicated services are covered at 100%. SW advised the patient to contact her orthopedic office to ensure the correct Medicare and Medicaid health plan information is on file. SW further advised the patient to request the billing department contact her health plan to verify benefits.     Follow Up Plan: SW encouraged the patient to contact a care management team with future resource needs.  Daneen Schick, BSW, CDP Social Worker, Certified Dementia Practitioner [facility] [phone]

## 2020-02-06 DIAGNOSIS — M549 Dorsalgia, unspecified: Secondary | ICD-10-CM | POA: Diagnosis not present

## 2020-02-06 DIAGNOSIS — G8929 Other chronic pain: Secondary | ICD-10-CM | POA: Diagnosis not present

## 2020-02-06 DIAGNOSIS — E1122 Type 2 diabetes mellitus with diabetic chronic kidney disease: Secondary | ICD-10-CM | POA: Diagnosis not present

## 2020-02-06 DIAGNOSIS — I129 Hypertensive chronic kidney disease with stage 1 through stage 4 chronic kidney disease, or unspecified chronic kidney disease: Secondary | ICD-10-CM | POA: Diagnosis not present

## 2020-02-06 DIAGNOSIS — N1831 Chronic kidney disease, stage 3a: Secondary | ICD-10-CM | POA: Diagnosis not present

## 2020-02-08 ENCOUNTER — Other Ambulatory Visit: Payer: Self-pay | Admitting: Internal Medicine

## 2020-02-08 DIAGNOSIS — N1831 Chronic kidney disease, stage 3a: Secondary | ICD-10-CM

## 2020-02-09 ENCOUNTER — Ambulatory Visit
Admission: RE | Admit: 2020-02-09 | Discharge: 2020-02-09 | Disposition: A | Discharge status: Home / Self Care | Payer: Medicare Other | Source: Ambulatory Visit | Attending: Internal Medicine | Admitting: Internal Medicine

## 2020-02-09 DIAGNOSIS — N1831 Chronic kidney disease, stage 3a: Secondary | ICD-10-CM

## 2020-02-09 DIAGNOSIS — N189 Chronic kidney disease, unspecified: Secondary | ICD-10-CM | POA: Diagnosis not present

## 2020-02-21 ENCOUNTER — Encounter: Payer: Self-pay | Admitting: Nurse Practitioner

## 2020-02-21 NOTE — Progress Notes (Signed)
Subjective:     Patient ID: Margaret Webster , female    DOB: 11/10/1960 , 59 y.o.   MRN: [id number]   Chief Complaint  Patient presents with  . Diabetes    HPI  Diabetes She presents for her follow-up diabetic visit. Diabetes type: prediabetes. Her disease course has been stable. Pertinent negatives for hypoglycemia include no dizziness or nervousness/anxiousness. Pertinent negatives for diabetes include no blurred vision, no fatigue, no polydipsia, no polyphagia and no polyuria. Symptoms are stable. Risk factors for coronary artery disease include sedentary lifestyle and obesity. She is following a generally healthy diet. When asked about meal planning, she reported none. She has not had a previous visit with a dietitian. She rarely participates in exercise. She does not see a podiatrist.Eye exam is not current.  Thyroid Problem Presents for follow-up visit. Patient reports no anxiety, constipation, diarrhea, fatigue or palpitations. The symptoms have been stable.     Past Medical History:  Diagnosis Date  . Arthritis    bil shoulders  . Bronchitis   . Chronic back pain   . Diabetes mellitus    stopped Mrtformin 1 mo ago, made her "sick"  . Hypertension   . Hypothyroidism   . Thyroid disease   . Trigger thumb of right hand      Family History  Problem Relation Age of Onset  . Diabetes Mother   . Cystic fibrosis Sister   . Cancer Sister        ?  Marland Kitchen Heart disease Maternal Grandmother   . Clotting disorder Maternal Grandfather      Current Outpatient Medications:  .  lidocaine (LIDODERM) 5 %, Place 1 patch onto the skin daily. Remove & Discard patch within 12 hours or as directed by MD, Disp: 30 patch, Rfl: 0 .  mometasone (NASONEX) 50 MCG/ACT nasal spray, Place 2 sprays into the nose daily., Disp: 17 g, Rfl: 2 .  polyvinyl alcohol (LIQUIFILM TEARS) 1.4 % ophthalmic solution, Place 1 drop into both eyes as needed for dry eyes., Disp: , Rfl:  .  pregabalin (LYRICA) 50 MG  capsule, Take 1 capsule (50 mg total) by mouth daily., Disp: 90 capsule, Rfl: 1 .  traZODone (DESYREL) 50 MG tablet, Take 1 tablet (50 mg total) by mouth at bedtime., Disp: 30 tablet, Rfl: 2 .  diclofenac Sodium (VOLTAREN) 1 % GEL, Apply 2 g topically 4 (four) times daily., Disp: 100 g, Rfl: 5 .  Semaglutide,0.25 or 0.5MG /DOS, (OZEMPIC, 0.25 OR 0.5 MG/DOSE,) 2 MG/1.5ML SOPN, Inject 0.5 mg into the skin once a week., Disp: 2 pen, Rfl: 2   Allergies  Allergen Reactions  . Sulfa Antibiotics Other (See Comments)    Hard to breathe  . Aspirin Other (See Comments)    Per pcp  . Ibuprofen Other (See Comments)    Stomach upset  . Polytrim [Polymyxin B-Trimethoprim] Itching and Swelling    redness  . Influenza Vaccines Hives  . Lipitor [Atorvastatin Calcium] Other (See Comments)    Muscle ache   . Naproxen Nausea And Vomiting     Review of Systems  Constitutional: Negative for fatigue.  Eyes: Negative.  Negative for blurred vision.  Respiratory: Negative.   Cardiovascular: Negative.  Negative for palpitations.  Gastrointestinal: Negative.  Negative for abdominal distention, constipation and diarrhea.  Endocrine: Negative for polydipsia, polyphagia and polyuria.  Musculoskeletal: Negative.        Has a thumb splint in place due to scaphoid injury after getting her thumb caught in  the door at [facility]   Skin: Negative.   Neurological: Negative for dizziness.  Psychiatric/Behavioral: The patient is not nervous/anxious.      Today's Vitals   01/25/20 1416  BP: 118/72  Pulse: 65  Temp: 98.3 F (36.8 C)  TempSrc: Oral  SpO2: 96%  Weight: 224 lb 3.2 oz (101.7 kg)  Height: 5' 7.6" (1.717 m)   Body mass index is 34.49 kg/m.   Objective:  Physical Exam Constitutional:      Appearance: She is well-developed.  HENT:     Head: Normocephalic.     Right Ear: Ear canal and external ear normal. There is no impacted cerumen. Tympanic membrane is bulging.     Left Ear: Ear canal and  external ear normal. There is no impacted cerumen. Tympanic membrane is bulging.  Neck:     Thyroid: No thyromegaly.     Trachea: No tracheal deviation.  Cardiovascular:     Rate and Rhythm: Normal rate and regular rhythm.     Heart sounds: Normal heart sounds.  Pulmonary:     Effort: Pulmonary effort is normal.     Breath sounds: Normal breath sounds.  Musculoskeletal:     Cervical back: Normal range of motion and neck supple.  Skin:    General: Skin is warm and dry.  Neurological:     Mental Status: She is alert and oriented to person, place, and time.         Assessment And Plan:     1. Non-seasonal allergic rhinitis due to pollen  Encouraged to avoid triggers if possible - mometasone (NASONEX) 50 MCG/ACT nasal spray; Place 2 sprays into the nose daily.  Dispense: 17 g; Refill: 2  2. Multiple joint pain  Will refill her lyrica  - pregabalin (LYRICA) 50 MG capsule; Take 1 capsule (50 mg total) by mouth daily.  Dispense: 90 capsule; Refill: 1 - diclofenac Sodium (VOLTAREN) 1 % GEL; Apply 2 g topically 4 (four) times daily.  Dispense: 100 g; Refill: 5  3. Essential hypertension  Chronic, controlled  No current medications  4. Prediabetes  Chronic, controlled  No current medications  Will check HgbA1c.   - Semaglutide,0.25 or 0.5MG /DOS, (OZEMPIC, 0.25 OR 0.5 MG/DOSE,) 2 MG/1.5ML SOPN; Inject 0.5 mg into the skin once a week.  Dispense: 2 pen; Refill: 2   [name], FNP

## 2020-02-26 ENCOUNTER — Inpatient Hospital Stay (HOSPITAL_COMMUNITY): Payer: Medicare Other

## 2020-02-26 ENCOUNTER — Encounter (HOSPITAL_COMMUNITY): Payer: Self-pay | Admitting: Emergency Medicine

## 2020-02-26 ENCOUNTER — Other Ambulatory Visit: Payer: Self-pay

## 2020-02-26 ENCOUNTER — Emergency Department (HOSPITAL_COMMUNITY): Payer: Medicare Other

## 2020-02-26 ENCOUNTER — Other Ambulatory Visit (HOSPITAL_COMMUNITY): Payer: Medicare Other

## 2020-02-26 ENCOUNTER — Inpatient Hospital Stay (HOSPITAL_COMMUNITY)
Admission: EM | Admit: 2020-02-26 | Discharge: 2020-02-28 | DRG: 065 | Disposition: A | Discharge status: Home / Self Care | Payer: Medicare Other | Attending: Internal Medicine | Admitting: Internal Medicine

## 2020-02-26 DIAGNOSIS — G8929 Other chronic pain: Secondary | ICD-10-CM | POA: Diagnosis present

## 2020-02-26 DIAGNOSIS — I10 Essential (primary) hypertension: Secondary | ICD-10-CM | POA: Diagnosis present

## 2020-02-26 DIAGNOSIS — I161 Hypertensive emergency: Secondary | ICD-10-CM | POA: Diagnosis not present

## 2020-02-26 DIAGNOSIS — I6389 Other cerebral infarction: Secondary | ICD-10-CM | POA: Diagnosis not present

## 2020-02-26 DIAGNOSIS — E1151 Type 2 diabetes mellitus with diabetic peripheral angiopathy without gangrene: Secondary | ICD-10-CM | POA: Diagnosis not present

## 2020-02-26 DIAGNOSIS — Z887 Allergy status to serum and vaccine status: Secondary | ICD-10-CM

## 2020-02-26 DIAGNOSIS — I129 Hypertensive chronic kidney disease with stage 1 through stage 4 chronic kidney disease, or unspecified chronic kidney disease: Secondary | ICD-10-CM | POA: Diagnosis present

## 2020-02-26 DIAGNOSIS — N183 Chronic kidney disease, stage 3 unspecified: Secondary | ICD-10-CM | POA: Diagnosis present

## 2020-02-26 DIAGNOSIS — E119 Type 2 diabetes mellitus without complications: Secondary | ICD-10-CM

## 2020-02-26 DIAGNOSIS — E1122 Type 2 diabetes mellitus with diabetic chronic kidney disease: Secondary | ICD-10-CM | POA: Diagnosis present

## 2020-02-26 DIAGNOSIS — I69351 Hemiplegia and hemiparesis following cerebral infarction affecting right dominant side: Secondary | ICD-10-CM | POA: Diagnosis not present

## 2020-02-26 DIAGNOSIS — Z8249 Family history of ischemic heart disease and other diseases of the circulatory system: Secondary | ICD-10-CM

## 2020-02-26 DIAGNOSIS — Z743 Need for continuous supervision: Secondary | ICD-10-CM | POA: Diagnosis not present

## 2020-02-26 DIAGNOSIS — I63332 Cerebral infarction due to thrombosis of left posterior cerebral artery: Secondary | ICD-10-CM

## 2020-02-26 DIAGNOSIS — Z882 Allergy status to sulfonamides status: Secondary | ICD-10-CM | POA: Diagnosis not present

## 2020-02-26 DIAGNOSIS — E039 Hypothyroidism, unspecified: Secondary | ICD-10-CM | POA: Diagnosis present

## 2020-02-26 DIAGNOSIS — M1990 Unspecified osteoarthritis, unspecified site: Secondary | ICD-10-CM | POA: Diagnosis present

## 2020-02-26 DIAGNOSIS — I728 Aneurysm of other specified arteries: Secondary | ICD-10-CM | POA: Diagnosis not present

## 2020-02-26 DIAGNOSIS — Z886 Allergy status to analgesic agent status: Secondary | ICD-10-CM | POA: Diagnosis not present

## 2020-02-26 DIAGNOSIS — R200 Anesthesia of skin: Secondary | ICD-10-CM | POA: Diagnosis not present

## 2020-02-26 DIAGNOSIS — R7989 Other specified abnormal findings of blood chemistry: Secondary | ICD-10-CM | POA: Diagnosis present

## 2020-02-26 DIAGNOSIS — I63312 Cerebral infarction due to thrombosis of left middle cerebral artery: Secondary | ICD-10-CM | POA: Diagnosis not present

## 2020-02-26 DIAGNOSIS — M549 Dorsalgia, unspecified: Secondary | ICD-10-CM | POA: Diagnosis present

## 2020-02-26 DIAGNOSIS — I69392 Facial weakness following cerebral infarction: Secondary | ICD-10-CM | POA: Diagnosis not present

## 2020-02-26 DIAGNOSIS — F17210 Nicotine dependence, cigarettes, uncomplicated: Secondary | ICD-10-CM | POA: Diagnosis present

## 2020-02-26 DIAGNOSIS — N1831 Chronic kidney disease, stage 3a: Secondary | ICD-10-CM | POA: Diagnosis not present

## 2020-02-26 DIAGNOSIS — Z20822 Contact with and (suspected) exposure to covid-19: Secondary | ICD-10-CM | POA: Diagnosis not present

## 2020-02-26 DIAGNOSIS — Z03818 Encounter for observation for suspected exposure to other biological agents ruled out: Secondary | ICD-10-CM | POA: Diagnosis not present

## 2020-02-26 DIAGNOSIS — R001 Bradycardia, unspecified: Secondary | ICD-10-CM | POA: Diagnosis present

## 2020-02-26 DIAGNOSIS — Z833 Family history of diabetes mellitus: Secondary | ICD-10-CM | POA: Diagnosis not present

## 2020-02-26 DIAGNOSIS — E785 Hyperlipidemia, unspecified: Secondary | ICD-10-CM | POA: Diagnosis not present

## 2020-02-26 DIAGNOSIS — Z6835 Body mass index (BMI) 35.0-35.9, adult: Secondary | ICD-10-CM

## 2020-02-26 DIAGNOSIS — E669 Obesity, unspecified: Secondary | ICD-10-CM | POA: Diagnosis present

## 2020-02-26 DIAGNOSIS — I639 Cerebral infarction, unspecified: Secondary | ICD-10-CM | POA: Diagnosis not present

## 2020-02-26 DIAGNOSIS — R29898 Other symptoms and signs involving the musculoskeletal system: Secondary | ICD-10-CM

## 2020-02-26 DIAGNOSIS — R29703 NIHSS score 3: Secondary | ICD-10-CM | POA: Diagnosis not present

## 2020-02-26 DIAGNOSIS — Z888 Allergy status to other drugs, medicaments and biological substances status: Secondary | ICD-10-CM

## 2020-02-26 DIAGNOSIS — R531 Weakness: Secondary | ICD-10-CM | POA: Diagnosis not present

## 2020-02-26 DIAGNOSIS — Z79899 Other long term (current) drug therapy: Secondary | ICD-10-CM

## 2020-02-26 DIAGNOSIS — N182 Chronic kidney disease, stage 2 (mild): Secondary | ICD-10-CM

## 2020-02-26 DIAGNOSIS — I63322 Cerebral infarction due to thrombosis of left anterior cerebral artery: Secondary | ICD-10-CM

## 2020-02-26 LAB — CBC WITH DIFFERENTIAL/PLATELET
Abs Immature Granulocytes: 0.01 10*3/uL (ref 0.00–0.07)
Basophils Absolute: 0 10*3/uL (ref 0.0–0.1)
Basophils Relative: 1 %
Eosinophils Absolute: 0.1 10*3/uL (ref 0.0–0.5)
Eosinophils Relative: 2 %
HCT: 43.7 % (ref 36.0–46.0)
Hemoglobin: 14.6 g/dL (ref 12.0–15.0)
Immature Granulocytes: 0 %
Lymphocytes Relative: 37 %
Lymphs Abs: 1.6 10*3/uL (ref 0.7–4.0)
MCH: 31.5 pg (ref 26.0–34.0)
MCHC: 33.4 g/dL (ref 30.0–36.0)
MCV: 94.2 fL (ref 80.0–100.0)
Monocytes Absolute: 0.5 10*3/uL (ref 0.1–1.0)
Monocytes Relative: 13 %
Neutro Abs: 2 10*3/uL (ref 1.7–7.7)
Neutrophils Relative %: 47 %
Platelets: 233 10*3/uL (ref 150–400)
RBC: 4.64 MIL/uL (ref 3.87–5.11)
RDW: 12.8 % (ref 11.5–15.5)
WBC: 4.2 10*3/uL (ref 4.0–10.5)
nRBC: 0 % (ref 0.0–0.2)

## 2020-02-26 LAB — T4, FREE: Free T4: 0.74 ng/dL (ref 0.61–1.12)

## 2020-02-26 LAB — CREATININE, SERUM
Creatinine, Ser: 1.01 mg/dL — ABNORMAL HIGH (ref 0.44–1.00)
GFR calc Af Amer: >60 mL/min (ref >60)
GFR calc non Af Amer: >60 mL/min (ref >60)

## 2020-02-26 LAB — CBC
HCT: 43.8 % (ref 36.0–46.0)
Hemoglobin: 14.7 g/dL (ref 12.0–15.0)
MCH: 31.5 pg (ref 26.0–34.0)
MCHC: 33.6 g/dL (ref 30.0–36.0)
MCV: 93.8 fL (ref 80.0–100.0)
Platelets: 245 10*3/uL (ref 150–400)
RBC: 4.67 MIL/uL (ref 3.87–5.11)
RDW: 12.8 % (ref 11.5–15.5)
WBC: 4.5 10*3/uL (ref 4.0–10.5)
nRBC: 0 % (ref 0.0–0.2)

## 2020-02-26 LAB — HIV ANTIBODY (ROUTINE TESTING W REFLEX): HIV Screen 4th Generation wRfx: NONREACTIVE

## 2020-02-26 LAB — PROTIME-INR
INR: 1 (ref 0.8–1.2)
Prothrombin Time: 12.6 seconds (ref 11.4–15.2)

## 2020-02-26 LAB — TSH: TSH: 4.66 u[IU]/mL — ABNORMAL HIGH (ref 0.350–4.500)

## 2020-02-26 LAB — COMPREHENSIVE METABOLIC PANEL
ALT: 17 U/L (ref 0–44)
AST: 22 U/L (ref 15–41)
Albumin: 3.6 g/dL (ref 3.5–5.0)
Alkaline Phosphatase: 60 U/L (ref 38–126)
Anion gap: 12 (ref 5–15)
BUN: 14 mg/dL (ref 6–20)
CO2: 22 mmol/L (ref 22–32)
Calcium: 9.3 mg/dL (ref 8.9–10.3)
Chloride: 106 mmol/L (ref 98–111)
Creatinine, Ser: 1.12 mg/dL — ABNORMAL HIGH (ref 0.44–1.00)
GFR calc Af Amer: >60 mL/min (ref >60)
GFR calc non Af Amer: 53 mL/min — ABNORMAL LOW (ref >60)
Glucose, Bld: 81 mg/dL (ref 70–99)
Potassium: 4 mmol/L (ref 3.5–5.1)
Sodium: 140 mmol/L (ref 135–145)
Total Bilirubin: 0.9 mg/dL (ref 0.3–1.2)
Total Protein: 7.2 g/dL (ref 6.5–8.1)

## 2020-02-26 LAB — URINALYSIS, ROUTINE W REFLEX MICROSCOPIC
Bilirubin Urine: NEGATIVE
Glucose, UA: NEGATIVE mg/dL
Hgb urine dipstick: NEGATIVE
Ketones, ur: NEGATIVE mg/dL
Leukocytes,Ua: NEGATIVE
Nitrite: NEGATIVE
Protein, ur: NEGATIVE mg/dL
Specific Gravity, Urine: 1.006 (ref 1.005–1.030)
pH: 7 (ref 5.0–8.0)

## 2020-02-26 LAB — GLUCOSE, CAPILLARY: Glucose-Capillary: 93 mg/dL (ref 70–99)

## 2020-02-26 LAB — RESPIRATORY PANEL BY RT PCR (FLU A&B, COVID)
Influenza A by PCR: NEGATIVE
Influenza B by PCR: NEGATIVE
SARS Coronavirus 2 by RT PCR: NEGATIVE

## 2020-02-26 LAB — CBG MONITORING, ED
Glucose-Capillary: 77 mg/dL (ref 70–99)
Glucose-Capillary: 126 mg/dL — ABNORMAL HIGH (ref 70–99)

## 2020-02-26 LAB — HEMOGLOBIN A1C
Hgb A1c MFr Bld: 5.9 % — ABNORMAL HIGH (ref 4.8–5.6)
Mean Plasma Glucose: 122.63 mg/dL

## 2020-02-26 LAB — TROPONIN I (HIGH SENSITIVITY)
Troponin I (High Sensitivity): 6 ng/L (ref <18)
Troponin I (High Sensitivity): 3 ng/L (ref <18)

## 2020-02-26 MED ORDER — INSULIN ASPART 100 UNIT/ML ~~LOC~~ SOLN: 0.0000 [IU] | Freq: Every day | SUBCUTANEOUS | Status: DC

## 2020-02-26 MED ORDER — STROKE: EARLY STAGES OF RECOVERY BOOK: Freq: Once | Status: DC

## 2020-02-26 MED ORDER — INSULIN ASPART 100 UNIT/ML ~~LOC~~ SOLN
0.0000 [IU] | Freq: Three times a day (TID) | SUBCUTANEOUS | Status: DC
  Administered 2020-02-26: 2 [IU] via SUBCUTANEOUS
  Administered 2020-02-27: 3 [IU] via SUBCUTANEOUS
  Administered 2020-02-27: 2 [IU] via SUBCUTANEOUS

## 2020-02-26 MED ORDER — ROSUVASTATIN CALCIUM 20 MG PO TABS
20.0000 mg | ORAL_TABLET | Freq: Every day | ORAL | Status: DC
  Administered 2020-02-27: 20 mg via ORAL
  Filled 2020-02-26: qty 1

## 2020-02-26 MED ORDER — ASPIRIN 325 MG PO TABS: 325.0000 mg | ORAL_TABLET | Freq: Every day | ORAL | Status: DC

## 2020-02-26 MED ORDER — IOHEXOL 350 MG/ML SOLN
75.0000 mL | Freq: Once | INTRAVENOUS | Status: AC | PRN
  Administered 2020-02-26: 75 mL via INTRAVENOUS

## 2020-02-26 MED ORDER — ACETAMINOPHEN 160 MG/5ML PO SOLN: 650.0000 mg | ORAL | Status: DC | PRN

## 2020-02-26 MED ORDER — ACETAMINOPHEN 325 MG PO TABS
650.0000 mg | ORAL_TABLET | ORAL | Status: DC | PRN
  Administered 2020-02-27: 650 mg via ORAL
  Filled 2020-02-26: qty 2

## 2020-02-26 MED ORDER — CLOPIDOGREL BISULFATE 75 MG PO TABS
75.0000 mg | ORAL_TABLET | Freq: Every day | ORAL | Status: DC
  Administered 2020-02-26 – 2020-02-28 (×3): 75 mg via ORAL
  Filled 2020-02-26 (×3): qty 1

## 2020-02-26 MED ORDER — ACETAMINOPHEN 650 MG RE SUPP: 650.0000 mg | RECTAL | Status: DC | PRN

## 2020-02-26 MED ORDER — ENOXAPARIN SODIUM 40 MG/0.4ML ~~LOC~~ SOLN
40.0000 mg | Freq: Every day | SUBCUTANEOUS | Status: DC
  Administered 2020-02-26 – 2020-02-28 (×3): 40 mg via SUBCUTANEOUS
  Filled 2020-02-26 (×3): qty 0.4

## 2020-02-26 NOTE — H&P (Signed)
History and Physical    Margaret Webster [id number] DOB: May 24, 1961 DOA: 02/26/2020  PCP: [name], FNP  Patient coming from: Home I have personally briefly reviewed patient's old medical records in [place]  Chief Complaint: Right-sided numbness tingling since 3 days  HPI: Margaret Webster is a 59 y.o. female with medical history significant of type 2 diabetes mellitus, hypertension, hypothyroidism, tobacco abuse presents to ER with right-sided numbness tingling sensation since 3 days.  Intermittent, comes and goes.  This morning her symptoms got worse and has numbness tingling sensation on right side of face so she decided to come to the ER for further evaluation and management.  EMS noted elevated blood pressure of 240/130 upon arrival.  She denies association with slurred speech, headache, blurry vision, chest pain, shortness of breath, palpitation, lightheadedness, dizziness, leg swelling, facial droop, syncope, nausea, vomiting, diarrhea, decreased appetite or weight loss.  No previous history of stroke  Has history of hypertension and was on Lopressor however she stopped Lopressor by her PCP recommendation as her blood pressure was labile?.  Have not taken Lopressor since 2 to 3 years.  Has history of hypothyroidism and was on levothyroxine however she discontinued and have not taken since 3 to 4 years and she is not sure why it was discontinued.  She tells me that her physician started her on Ozempic for diabetes which helps in her weight loss.  She tells me she is not happy with the Ozempic and she will not take it.  She lives alone at home.  Nephew stays with her.  Smokes half pack of cigarettes per day, drinks alcohol occasionally.  No history of illicit drug use.  ED Course: Upon arrival to ED: Patient's blood pressure: 170/82, heart rate in 50s, initial work-up such as CBC, UA, troponin: WNL.  CMP shows CKD stage IIIa stable.  TSH: 4.660.  MRI shows late acute to  subacute thalamic infarction.  Chest x-ray negative for acute findings.  EDP consulted neurology.  [facility] hospitalist consulted for admission for acute ischemic stroke.  Review of Systems: As per HPI otherwise negative.    Past Medical History:  Diagnosis Date  . Arthritis    bil shoulders  . Bronchitis   . Chronic back pain   . Diabetes mellitus    stopped Mrtformin 1 mo ago, made her "sick"  . Hypertension   . Hypothyroidism   . Thyroid disease   . Trigger thumb of right hand     Past Surgical History:  Procedure Laterality Date  . BACK SURGERY  1993  . CARPAL TUNNEL RELEASE  08/2015  . CARPAL TUNNEL RELEASE Left 09/10/2017   Procedure: CARPAL TUNNEL RELEASE;  Surgeon: Leanora Cover, MD;  Location: [facility];  Service: Orthopedics;  Laterality: Left;  . CERVICAL SPINE SURGERY Right 11/2012  . OOPHORECTOMY    . TRIGGER FINGER RELEASE Right 08/06/2017   Procedure: RIGHT THUMB TRIGGER RELEASE;  Surgeon: Leanora Cover, MD;  Location: [facility];  Service: Orthopedics;  Laterality: Right;     reports that she has been smoking cigarettes. She has been smoking about 0.25 packs per day. She has never used smokeless tobacco. She reports current alcohol use. She reports current drug use. Drug: Marijuana.  Allergies  Allergen Reactions  . Sulfa Antibiotics Other (See Comments)    Hard to breathe  . Aspirin Other (See Comments)    Per pcp  . Ibuprofen Other (See Comments)    Stomach upset  .  Polytrim [Polymyxin B-Trimethoprim] Itching and Swelling    redness  . Influenza Vaccines Hives  . Lipitor [Atorvastatin Calcium] Other (See Comments)    Muscle ache   . Naproxen Nausea And Vomiting    Family History  Problem Relation Age of Onset  . Diabetes Mother   . Cystic fibrosis Sister   . Cancer Sister        ?  Marland Kitchen Heart disease Maternal Grandmother   . Clotting disorder Maternal Grandfather     Prior to Admission medications   Medication Sig  Start Date End Date Taking? Authorizing Provider  acetaminophen (TYLENOL) 500 MG tablet Take 1,000 mg by mouth every 6 (six) hours as needed for mild pain.   Yes [provider]  aspirin EC 81 MG tablet Take 81 mg by mouth once.   Yes [provider]  diclofenac Sodium (VOLTAREN) 1 % GEL Apply 2 g topically 4 (four) times daily. 01/25/20  Yes [name], FNP  mometasone (NASONEX) 50 MCG/ACT nasal spray Place 2 sprays into the nose daily. 01/25/20 01/24/21 Yes [name], FNP  polyvinyl alcohol (LIQUIFILM TEARS) 1.4 % ophthalmic solution Place 1 drop into both eyes as needed for dry eyes.   Yes [provider]  Semaglutide,0.25 or 0.5MG /DOS, (OZEMPIC, 0.25 OR 0.5 MG/DOSE,) 2 MG/1.5ML SOPN Inject 0.5 mg into the skin once a week. 01/25/20  Yes [name], FNP  valACYclovir (VALTREX) 500 MG tablet Take 500 mg by mouth daily. Started on 01-28-20 DS 30 01/28/20  Yes [provider]  lidocaine (LIDODERM) 5 % Place 1 patch onto the skin daily. Remove & Discard patch within 12 hours or as directed by MD 06/08/19   Quintella Reichert, MD  pregabalin (LYRICA) 50 MG capsule Take 1 capsule (50 mg total) by mouth daily. Patient not taking: Reported on 02/26/2020 01/25/20 01/24/21  [name], FNP  traZODone (DESYREL) 50 MG tablet Take 1 tablet (50 mg total) by mouth at bedtime. Patient not taking: Reported on 02/26/2020 09/19/19   [name], FNP    Physical Exam: Vitals:   02/26/20 0815 02/26/20 0830 02/26/20 0845 02/26/20 0915  BP: (!) 165/78 (!) 152/74 (!) 147/79 (!) 148/74  Pulse: (!) 59 (!) 53 (!) 57 (!) 54  Resp: 11 17 16 17   Temp:      TempSrc:      SpO2: 99% 95% 97% 98%  Weight:      Height:        Constitutional: NAD, calm, comfortable, on room air, communicating well Eyes: PERRL, lids and conjunctivae normal ENMT: Mucous membranes are moist. Posterior pharynx clear of any exudate or lesions.Normal dentition.  Neck: normal, supple, no masses, no  thyromegaly Respiratory: clear to auscultation bilaterally, no wheezing, no crackles. Normal respiratory effort. No accessory muscle use.  Cardiovascular: Regular rate and rhythm, no murmurs / rubs / gallops. No extremity edema. 2+ pedal pulses. No carotid bruits.  Abdomen: no tenderness, no masses palpated. No hepatosplenomegaly. Bowel sounds positive.  Musculoskeletal: no clubbing / cyanosis. No joint deformity upper and lower extremities. Good ROM, no contractures. Normal muscle tone.  Skin: no rashes, lesions, ulcers. No induration Neurologic: CN 2-12 grossly intact, DTR normal. Strength 4 out of 5 in right upper and lower extremity, 5 out of 5 in left upper and lower extremity.  No facial droop or slurred speech noted. Psychiatric: Normal judgment and insight. Alert and oriented x 3. Normal mood.    Labs on Admission: I have personally reviewed following labs and imaging  studies  CBC: Recent Labs  Lab 02/26/20 0913  WBC 4.2  NEUTROABS 2.0  HGB 14.6  HCT 43.7  MCV 94.2  PLT 0000000   Basic Metabolic Panel: Recent Labs  Lab 02/26/20 0913  NA 140  K 4.0  CL 106  CO2 22  GLUCOSE 81  BUN 14  CREATININE 1.12*  CALCIUM 9.3   GFR: Estimated Creatinine Clearance: 65.4 mL/min (A) (by C-G formula based on SCr of 1.12 mg/dL (H)). Liver Function Tests: Recent Labs  Lab 02/26/20 0913  AST 22  ALT 17  ALKPHOS 60  BILITOT 0.9  PROT 7.2  ALBUMIN 3.6   No results for input(s): LIPASE, AMYLASE in the last 168 hours. No results for input(s): AMMONIA in the last 168 hours. Coagulation Profile: Recent Labs  Lab 02/26/20 0913  INR 1.0   Cardiac Enzymes: No results for input(s): CKTOTAL, CKMB, CKMBINDEX, TROPONINI in the last 168 hours. BNP (last 3 results) No results for input(s): PROBNP in the last 8760 hours. HbA1C: No results for input(s): HGBA1C in the last 72 hours. CBG: Recent Labs  Lab 02/26/20 0758  GLUCAP 77   Lipid Profile: No results for input(s): CHOL, HDL,  LDLCALC, TRIG, CHOLHDL, LDLDIRECT in the last 72 hours. Thyroid Function Tests: Recent Labs    02/26/20 0913  TSH 4.660*   Anemia Panel: No results for input(s): VITAMINB12, FOLATE, FERRITIN, TIBC, IRON, RETICCTPCT in the last 72 hours. Urine analysis:    Component Value Date/Time   COLORURINE YELLOW 02/26/2020 0918   APPEARANCEUR HAZY (A) 02/26/2020 0918   LABSPEC 1.006 02/26/2020 0918   PHURINE 7.0 02/26/2020 0918   GLUCOSEU NEGATIVE 02/26/2020 0918   HGBUR NEGATIVE 02/26/2020 0918   BILIRUBINUR NEGATIVE 02/26/2020 0918   BILIRUBINUR negative 09/19/2019 1731   KETONESUR NEGATIVE 02/26/2020 0918   PROTEINUR NEGATIVE 02/26/2020 0918   UROBILINOGEN 0.2 09/19/2019 1731   UROBILINOGEN 0.2 08/12/2015 1230   NITRITE NEGATIVE 02/26/2020 0918   LEUKOCYTESUR NEGATIVE 02/26/2020 0918    Radiological Exams on Admission: DG Chest 2 View  Result Date: 02/26/2020 CLINICAL DATA:  Hypertensive emergency, infection EXAM: CHEST - 2 VIEW COMPARISON:  06/08/2019 FINDINGS: The heart size and mediastinal contours are within normal limits. Both lungs are clear. The visualized skeletal structures are unremarkable. IMPRESSION: No acute abnormality of the lungs. Electronically Signed   By: Eddie Candle M.D.   On: 02/26/2020 10:07   MR BRAIN WO CONTRAST  Result Date: 02/26/2020 CLINICAL DATA:  Right-sided numbness EXAM: MRI HEAD WITHOUT CONTRAST TECHNIQUE: Multiplanar, multiecho pulse sequences of the brain and surrounding structures were obtained without intravenous contrast. COMPARISON:  None. FINDINGS: Brain: There is a small focus of diffusion hyperintensity with mild ADC hypointensity within the left thalamus. Surrounding mild diffusion hyperintensity and ADC isointensity. There is no evidence of intracranial hemorrhage. Patchy small foci of T2 hyperintensity in the supratentorial white matter nonspecific but may reflect mild chronic microvascular ischemic changes. Ventricles and sulci are normal in size  and configuration. There is no intracranial mass, mass effect, or edema. There is no hydrocephalus or extra-axial fluid collection. Vascular: Major vessel flow voids at the skull base are preserved. Skull and upper cervical spine: Normal marrow signal is preserved. Anterior cervical fusion is noted extending inferiorly from C4. Sinuses/Orbits: Paranasal sinuses are aerated. Orbits are unremarkable. Other: Sella is unremarkable.  Mastoid air cells are clear. IMPRESSION: Late acute to subacute left thalamic infarct. Probable mild chronic microvascular ischemic changes. Electronically Signed   By: Addison Lank.D.  On: 02/26/2020 11:33    EKG: Independently reviewed.  Sinus rhythm, no ST elevation or depression noted.  Assessment/Plan Principal Problem:   CVA (cerebral vascular accident) ([place]) Active Problems:   DM (diabetes mellitus) ([facility])   Hypertension   CKD (chronic kidney disease) stage 3, GFR 30-59 ml/min   Bradycardia   Elevated TSH   Left acute to subacute thalamic infarction: -Patient presented with right-sided numbness tingling sensation. -Risk factors including hypertension, diabetes, tobacco abuse -Reviewed MRI brain and chest x-ray. -admit forTelemetry monitoring -Stroke protocol -Allow for permissive hypertension for the first 24-48h - only treat PRN if SBP 123456 mmHg or diastolic blood pressure [id number]. Blood pressures can be gradually normalized to SBP<140 upon discharge. -Ordered CTA head and neck, echocardiogram to- rule out PFO, Lipid Panel and A1C -Frequent neuro checks -Statin PO within 24 hrs. -Risk factor modification -Consult Neurology-await recommendations -PT/OT eval, Speech consult  Hypertension: -Reviewed home meds-she is currently not on any antihypertensive at home.  Monitor blood pressure closely.  Allow permissive hypertension.  Diabetes mellitus: Check A1c -On Ozempic at home. -Started on sliding scale insulin and monitor blood sugar closely.   Hypothyroidism: -Not taking levothyroxine since 3 to 4 years. -TSH: 4.660 -Check free T4 and total T3  CKD stage IIIa: Stable -Continue to monitor.  Bradycardia: Heart rate noted to be in 50s -Could be due to underlying hypothyroidism. -Reviewed EKG. -We will get transthoracic echo.  Tobacco abuse: -Counseled about cessation  DVT prophylaxis: Lovenox/SCD/TED  code Status: Full code Family Communication: None present at bedside.  Plan of care discussed with patient in length and she verbalized understanding and agreed with it. Disposition Plan: To be determined Consults called: Neurology by EDP Admission status: Inpatient   Mckinley Jewel MD [facility]  If 7PM-7AM, please contact night-coverage [email or web address] Password [facility]  02/26/2020, 12:36 PM

## 2020-02-26 NOTE — ED Notes (Signed)
This RN called for report; RN upstairs requested to perform bedside report; this RN will fulfill.

## 2020-02-26 NOTE — ED Provider Notes (Signed)
[facility] EMERGENCY DEPARTMENT Provider Note   CSN: [id number] Arrival date & time: 02/26/20  0750     History Chief Complaint  Patient presents with   Numbness    Margaret Webster is a 59 y.o. female.  The history is provided by the patient and medical records. No language interpreter was used.  Neurologic Problem This is a new problem. The current episode started more than 2 days ago. The problem occurs constantly. The problem has not changed (waxing and waning) since onset.Pertinent negatives include no chest pain, no abdominal pain, no headaches and no shortness of breath. Nothing aggravates the symptoms. Nothing relieves the symptoms. She has tried nothing for the symptoms. The treatment provided no relief.       Past Medical History:  Diagnosis Date   Arthritis    bil shoulders   Bronchitis    Chronic back pain    Diabetes mellitus    stopped Mrtformin 1 mo ago, made her "sick"   Hypertension    Hypothyroidism    Thyroid disease    Trigger thumb of right hand     Patient Active Problem List   Diagnosis Date Noted   Multiple joint pain 11/07/2019   Acute pain of right shoulder 09/19/2019   Right hip pain 09/19/2019   Prediabetes 09/08/2018   Chest pain 01/10/2015   Diabetes mellitus 01/10/2015   Hypertension 01/10/2015   Thyroid disease 01/10/2015   Headache 01/10/2015   Abdominal pain 01/10/2015   Vaginitis 04/07/2014   Pain 04/07/2014   Vaginal irritation 04/07/2014   Boil of buttock 04/06/2014   BV (bacterial vaginosis) 04/06/2014   Unspecified symptom associated with female genital organs 11/16/2013   Candidiasis of vulva and vagina 11/16/2013   Vaginitis and vulvovaginitis, unspecified 06/07/2013   Pain aggravated by activities of daily living 06/07/2013   Nonspecific abnormal finding in stool contents 12/02/2011   Diabetes mellitus without mention of complication [id number]   Chronic back pain       Past Surgical History:  Procedure Laterality Date   [place]  08/2015   CARPAL TUNNEL RELEASE Left 09/10/2017   Procedure: CARPAL TUNNEL RELEASE;  Surgeon: Leanora Cover, MD;  Location: [facility];  Service: Orthopedics;  Laterality: Left;   CERVICAL SPINE SURGERY Right 11/2012   OOPHORECTOMY     TRIGGER FINGER RELEASE Right 08/06/2017   Procedure: RIGHT THUMB TRIGGER RELEASE;  Surgeon: Leanora Cover, MD;  Location: [facility];  Service: Orthopedics;  Laterality: Right;     OB History    Gravida  1   Para  0   Term  0   Preterm  0   AB  0   Living  1     SAB  0   TAB  0   Ectopic  0   Multiple  0   Live Births  1           Family History  Problem Relation Age of Onset   Diabetes Mother    Cystic fibrosis Sister    Cancer Sister        ?   Heart disease Maternal Grandmother    Clotting disorder Maternal Grandfather     Social History   Tobacco Use   Smoking status: Current Every Day Smoker    Packs/day: 0.25    Types: Cigarettes   Smokeless tobacco: Never Used   Tobacco comment: 4 cig a day, not  interested in chantix  Substance Use Topics   Alcohol use: Yes    Comment: occas   Drug use: Yes    Types: Marijuana    Home Medications Prior to Admission medications   Medication Sig Start Date End Date Taking? Authorizing Provider  diclofenac Sodium (VOLTAREN) 1 % GEL Apply 2 g topically 4 (four) times daily. 01/25/20   [name], FNP  lidocaine (LIDODERM) 5 % Place 1 patch onto the skin daily. Remove & Discard patch within 12 hours or as directed by MD 06/08/19   Quintella Reichert, MD  mometasone (NASONEX) 50 MCG/ACT nasal spray Place 2 sprays into the nose daily. 01/25/20 01/24/21  [name], FNP  polyvinyl alcohol (LIQUIFILM TEARS) 1.4 % ophthalmic solution Place 1 drop into both eyes as needed for dry eyes.    [provider]  pregabalin (LYRICA) 50 MG  capsule Take 1 capsule (50 mg total) by mouth daily. 01/25/20 01/24/21  [name], FNP  Semaglutide,0.25 or 0.5MG /DOS, (OZEMPIC, 0.25 OR 0.5 MG/DOSE,) 2 MG/1.5ML SOPN Inject 0.5 mg into the skin once a week. 01/25/20   [name], FNP  traZODone (DESYREL) 50 MG tablet Take 1 tablet (50 mg total) by mouth at bedtime. 09/19/19   [name], FNP    Allergies    Sulfa antibiotics, Aspirin, Ibuprofen, Polytrim [polymyxin b-trimethoprim], Influenza vaccines, Lipitor [atorvastatin calcium], and Naproxen  Review of Systems   Review of Systems  Constitutional: Positive for fatigue. Negative for chills, diaphoresis, fever and unexpected weight change.  HENT: Negative for congestion.   Eyes: Negative for photophobia and visual disturbance.  Respiratory: Negative for choking, chest tightness, shortness of breath and wheezing.   Cardiovascular: Negative for chest pain.  Gastrointestinal: Negative for abdominal pain, constipation, diarrhea, nausea and vomiting.  Genitourinary: Negative for dysuria, flank pain and frequency.  Musculoskeletal: Negative for back pain, neck pain and neck stiffness.  Skin: Negative for rash and wound.  Neurological: Positive for weakness and numbness. Negative for dizziness, seizures, facial asymmetry, speech difficulty, light-headedness and headaches.  Psychiatric/Behavioral: Negative for agitation and confusion.  All other systems reviewed and are negative.   Physical Exam Updated Vital Signs BP (!) 170/82 (BP Location: Right Arm)    Pulse 60    Temp 98.4 F (36.9 C) (Oral)    Resp 15    Ht 5\' 7"  (1.702 m)    Wt 101.6 kg    SpO2 100%    BMI 35.08 kg/m   Physical Exam Vitals and nursing note reviewed.  Constitutional:      General: She is not in acute distress.    Appearance: She is well-developed. She is not ill-appearing, toxic-appearing or diaphoretic.  HENT:     Head: Normocephalic and atraumatic.     Nose: Nose normal. No congestion or rhinorrhea.      Mouth/Throat:     Mouth: Mucous membranes are moist.     Pharynx: No oropharyngeal exudate or posterior oropharyngeal erythema.  Eyes:     Extraocular Movements: Extraocular movements intact.     Conjunctiva/sclera: Conjunctivae normal.     Pupils: Pupils are equal, round, and reactive to light.  Cardiovascular:     Rate and Rhythm: Normal rate and regular rhythm.     Pulses: Normal pulses.     Heart sounds: No murmur.  Pulmonary:     Effort: Pulmonary effort is normal. No respiratory distress.     Breath sounds: Normal breath sounds. No wheezing, rhonchi or rales.  Chest:  Chest wall: No tenderness.  Abdominal:     General: Abdomen is flat.     Palpations: Abdomen is soft.     Tenderness: There is no abdominal tenderness. There is no right CVA tenderness, left CVA tenderness, guarding or rebound.  Musculoskeletal:        General: No tenderness.     Cervical back: Neck supple. No tenderness.     Right lower leg: No edema.     Left lower leg: No edema.  Skin:    General: Skin is warm and dry.     Capillary Refill: Capillary refill takes less than 2 seconds.     Findings: No erythema.  Neurological:     General: No focal deficit present.     Mental Status: She is alert.     GCS: GCS eye subscore is 4. GCS verbal subscore is 5. GCS motor subscore is 6.     Cranial Nerves: Cranial nerve deficit present. No dysarthria or facial asymmetry.     Sensory: Sensory deficit present.     Motor: Weakness present. No seizure activity.     Coordination: Finger-Nose-Finger Test abnormal.     Comments: Subjective numbness on right face, right arm, right leg.  Weakness in right grip strength and right leg raise.  Also some slowness with finger-nose-finger on the right compared to left.  Psychiatric:        Mood and Affect: Mood normal.     ED Results / Procedures / Treatments   Labs (all labs ordered are listed, but only abnormal results are displayed) Labs Reviewed  COMPREHENSIVE  METABOLIC PANEL - Abnormal; Notable for the following components:      Result Value   Creatinine, Ser 1.12 (*)    GFR calc non Af Amer 53 (*)    All other components within normal limits  TSH - Abnormal; Notable for the following components:   TSH 4.660 (*)    All other components within normal limits  URINALYSIS, ROUTINE W REFLEX MICROSCOPIC - Abnormal; Notable for the following components:   APPearance HAZY (*)    All other components within normal limits  CREATININE, SERUM - Abnormal; Notable for the following components:   Creatinine, Ser 1.01 (*)    All other components within normal limits  RESPIRATORY PANEL BY RT PCR (FLU A&B, COVID)  URINE CULTURE  CBC WITH DIFFERENTIAL/PLATELET  PROTIME-INR  CBC  HIV ANTIBODY (ROUTINE TESTING W REFLEX)  T4, FREE  T3  HEMOGLOBIN A1C  CBG MONITORING, ED  TROPONIN I (HIGH SENSITIVITY)  TROPONIN I (HIGH SENSITIVITY)    EKG EKG Interpretation  Date/Time:  Sunday Feb 26 2020 07:56:40 EDT Ventricular Rate:  58 PR Interval:    QRS Duration: 77 QT Interval:  389 QTC Calculation: 382 R Axis:   58 Text Interpretation: Sinus rhythm Probable left atrial enlargement Borderline T abnormalities, lateral leads when compared to prior, no signficant changes seen. No STEMI Confirmed by Antony Blackbird [phone]) on 02/26/2020 8:07:14 AM   Radiology DG Chest 2 View  Result Date: 02/26/2020 CLINICAL DATA:  Hypertensive emergency, infection EXAM: CHEST - 2 VIEW COMPARISON:  06/08/2019 FINDINGS: The heart size and mediastinal contours are within normal limits. Both lungs are clear. The visualized skeletal structures are unremarkable. IMPRESSION: No acute abnormality of the lungs. Electronically Signed   By: Eddie Candle M.D.   On: 02/26/2020 10:07   MR BRAIN WO CONTRAST  Result Date: 02/26/2020 CLINICAL DATA:  Right-sided numbness EXAM: MRI HEAD WITHOUT CONTRAST TECHNIQUE: Multiplanar,  multiecho pulse sequences of the brain and surrounding structures were  obtained without intravenous contrast. COMPARISON:  None. FINDINGS: Brain: There is a small focus of diffusion hyperintensity with mild ADC hypointensity within the left thalamus. Surrounding mild diffusion hyperintensity and ADC isointensity. There is no evidence of intracranial hemorrhage. Patchy small foci of T2 hyperintensity in the supratentorial white matter nonspecific but may reflect mild chronic microvascular ischemic changes. Ventricles and sulci are normal in size and configuration. There is no intracranial mass, mass effect, or edema. There is no hydrocephalus or extra-axial fluid collection. Vascular: Major vessel flow voids at the skull base are preserved. Skull and upper cervical spine: Normal marrow signal is preserved. Anterior cervical fusion is noted extending inferiorly from C4. Sinuses/Orbits: Paranasal sinuses are aerated. Orbits are unremarkable. Other: Sella is unremarkable.  Mastoid air cells are clear. IMPRESSION: Late acute to subacute left thalamic infarct. Probable mild chronic microvascular ischemic changes. Electronically Signed   By: Macy Mis M.D.   On: 02/26/2020 11:33    Procedures Procedures (including critical care time)  Medications Ordered in ED Medications   stroke: mapping our early stages of recovery book (0 each Does not apply Hold 02/26/20 1553)  acetaminophen (TYLENOL) tablet 650 mg (has no administration in time range)    Or  acetaminophen (TYLENOL) 160 MG/5ML solution 650 mg (has no administration in time range)    Or  acetaminophen (TYLENOL) suppository 650 mg (has no administration in time range)  enoxaparin (LOVENOX) injection 40 mg (40 mg Subcutaneous Given 02/26/20 1441)  clopidogrel (PLAVIX) tablet 75 mg (75 mg Oral Given 02/26/20 1548)  insulin aspart (novoLOG) injection 0-15 Units (has no administration in time range)  insulin aspart (novoLOG) injection 0-5 Units (has no administration in time range)  iohexol (OMNIPAQUE) 350 MG/ML injection 75  mL (75 mLs Intravenous Contrast Given 02/26/20 1340)    ED Course  I have reviewed the triage vital signs and the nursing notes.  Pertinent labs & imaging results that were available during my care of the patient were reviewed by me and considered in my medical decision making (see chart for details).    MDM Rules/Calculators/A&P                      Brailynn Milota is a 59 y.o. female with a past medical history significant for diabetes, thyroid disease, chronic back pains, and hypertension which she has not been on medications for recently who presents with 3 days of waxing and waning right-sided numbness and weakness.  Patient reports that on Friday, after eating breakfast, she noticed she was having difficulty with ambulation and moving her right arm.  It was not cooperating with what she wanted it to do.  She reports he is having numbness in her right face, right arm, right leg and weakness in her right arm and right leg.  She denies facial droop.  She reports that she has had difference in cold and warm sensation on the right side compared to the left.  She is never had these symptoms before.  She reports no significant headache or neck injuries.  She says that she has been seen by her primary doctor in the last few months and was found to have reassuring blood pressures and has not been placed on blood pressure medication.  She says that she has not had any vision changes, difficulty with speech, or any dizziness.  She denies any chest pain, palpitations, shortness of breath.  She does  report feeling anxious that she is feeling restless in her extremities.  She says she has not felt like this before.  She called EMS this morning when her symptoms worsened again this morning as a been waxing and waning.  EMS found her to have a blood pressure of [id number] systolic and this is steadily improved since has been the emergency department.  On exam, patient has subjective numbness in her right face, right  arm, and right leg.  She had some weakness with right leg raise compared to left and some mild decrease in grip strength on the right compared to left.  She did have some difficulty with finger-nose-finger testing on the right and was much lower than the left.  Lungs clear and chest is nontender.  Abdomen nontender.  No neck tenderness on my exam.  No stridor or carotid bruits appreciated.  Symmetric smile.  Normal extraocular movements.  Clear speech.  Clinically I am somewhat concerned about waxing and waning neurologic deficits for the last few days could be related to stroke or intermittent TIA as well as hypertensive emergency given the blood pressure lability.  As her blood pressure on my evaluation is in the 140s, will hold on blood pressure medicine at this time however I am concerned if it spikes back up.  Will get labs as well as an MRI given her 3 days of symptoms.  Do not feel she is a code stroke at this time as it has been coming and going and she reports that she did have some numbness yesterday.  Anticipate reassessment after work-up to determine disposition.        12:19 PM MRI returned showing late acute to subacute left thalamic infarct.  This is consistent with the patient's symptoms.  Neurology was called who recommends admission for further stroke work-up.  They will see the patient.  Medicine team called for admission.   Final Clinical Impression(s) / ED Diagnoses Final diagnoses:  Cerebrovascular accident (CVA), unspecified mechanism ([place])  Right sided numbness  Right arm weakness  Right leg weakness     Clinical Impression: 1. Cerebrovascular accident (CVA), unspecified mechanism ([place])   2. Right sided numbness   3. Right arm weakness   4. Right leg weakness     Disposition: Admit  This note was prepared with assistance of Dragon voice recognition software. Occasional wrong-word or sound-a-like substitutions may have occurred due to the inherent limitations of  voice recognition software.     Octavian Godek, Gwenyth Allegra, MD 02/26/20 [phone]

## 2020-02-26 NOTE — ED Notes (Signed)
Lunch Tray Ordered @ 1440.

## 2020-02-26 NOTE — ED Triage Notes (Signed)
Pt BIB [facility] from home. Pt complaining of right sided numbness that has been intermittent since Thursday. Pt recently started taking medication for her blood sugar but stopped taking it on Thursday stating it makes her feel bad. Pt hypertensive upon EMS arrival 240/130 with hx of HTN but not prescribed any medication. A&Ox4. VSS. NAD.

## 2020-02-26 NOTE — Consult Note (Addendum)
NEURO HOSPITALIST  CONSULT   Requesting Physician: Dr. tegeler    Chief Complaint: intermittent right side numbness  History obtained from:  Patient    HPI:                                                                                                                                         Margaret Webster is an 59 y.o. female  With PMH HTN, DM, hypothyroidism, cervical spinals fusion (2012)   Patient stated that her symptoms began on Friday 02/24/20. She had a sudden onset of right side numbness that included her face, leg and arm. She also noticed that she was dropping things with her right hand.  She ignored went to lay down, then got up and went to work for about an hour. She returned home feeling better and kept going with her life per usual. Saturday she was fine worked for 4 hours without any problems. This morning about 0800 her right side became numb again. This time so numb that she could not really walk. She fell into the wall and needed assistance to get up. Her family called EMS and she came to the hospital. She stated that her PCP removed her from BP medication about 2 years ago. She also has not been on any medication for DM since 2012. She was on metformin that was stopped in 2012. She was recently started on semaglutide for weight loss. Her last of the 2.5 dose was Thursday. Endorses smoking 1/2 pack cigarettes per day, drinks ETOH, uses marijuana. Denies any vision changes, CP, SOB.  patient does not take a daily ASA d/t being allergic.  ED course:  BP:181/95 reported on arrival 240/130 MRI: subacute/acute left thalamic infarct BG: 77  tPA Given:no; outside of window  Modified Rankin: Rankin Score=0  NIHSS:3    Past Medical History:  Diagnosis Date  . Arthritis    bil shoulders  . Bronchitis   . Chronic back pain   . Diabetes mellitus    stopped Mrtformin 1 mo ago, made her "sick"  . Hypertension   .  Hypothyroidism   . Thyroid disease   . Trigger thumb of right hand     Past Surgical History:  Procedure Laterality Date  . BACK SURGERY  1993  . CARPAL TUNNEL RELEASE  08/2015  . CARPAL TUNNEL RELEASE Left 09/10/2017   Procedure: CARPAL TUNNEL RELEASE;  Surgeon: Leanora Cover, MD;  Location: [facility];  Service: Orthopedics;  Laterality: Left;  . CERVICAL SPINE SURGERY Right 11/2012  . OOPHORECTOMY    . TRIGGER FINGER RELEASE Right 08/06/2017   Procedure: RIGHT  THUMB TRIGGER RELEASE;  Surgeon: Leanora Cover, MD;  Location: [facility];  Service: Orthopedics;  Laterality: Right;    Family History  Problem Relation Age of Onset  . Diabetes Mother   . Cystic fibrosis Sister   . Cancer Sister        ?  Marland Kitchen Heart disease Maternal Grandmother   . Clotting disorder Maternal Grandfather          Social History:  reports that she has been smoking cigarettes. She has been smoking about 0.25 packs per day. She has never used smokeless tobacco. She reports current alcohol use. She reports current drug use. Drug: Marijuana.  Allergies:  Allergies  Allergen Reactions  . Sulfa Antibiotics Other (See Comments)    Hard to breathe  . Aspirin Other (See Comments)    Per pcp  . Ibuprofen Other (See Comments)    Stomach upset  . Polytrim [Polymyxin B-Trimethoprim] Itching and Swelling    redness  . Influenza Vaccines Hives  . Lipitor [Atorvastatin Calcium] Other (See Comments)    Muscle ache   . Naproxen Nausea And Vomiting    Medications:                                                                                                                           Current Facility-Administered Medications  Medication Dose Route Frequency Provider Last Rate Last Admin  .  stroke: mapping our early stages of recovery book   Does not apply Once Pahwani, Rinka R, MD      . acetaminophen (TYLENOL) tablet 650 mg  650 mg Oral Q4H PRN Pahwani, Rinka R, MD       Or  .  acetaminophen (TYLENOL) 160 MG/5ML solution 650 mg  650 mg Per Tube Q4H PRN Pahwani, Rinka R, MD       Or  . acetaminophen (TYLENOL) suppository 650 mg  650 mg Rectal Q4H PRN Pahwani, Rinka R, MD      . aspirin tablet 325 mg  325 mg Oral Daily Pahwani, Rinka R, MD      . enoxaparin (LOVENOX) injection 40 mg  40 mg Subcutaneous Daily Pahwani, Rinka R, MD       Current Outpatient Medications  Medication Sig Dispense Refill  . acetaminophen (TYLENOL) 500 MG tablet Take 1,000 mg by mouth every 6 (six) hours as needed for mild pain.    Marland Kitchen aspirin EC 81 MG tablet Take 81 mg by mouth once.    . diclofenac Sodium (VOLTAREN) 1 % GEL Apply 2 g topically 4 (four) times daily. 100 g 5  . mometasone (NASONEX) 50 MCG/ACT nasal spray Place 2 sprays into the nose daily. 17 g 2  . polyvinyl alcohol (LIQUIFILM TEARS) 1.4 % ophthalmic solution Place 1 drop into both eyes as needed for dry eyes.    . Semaglutide,0.25 or 0.5MG /DOS, (OZEMPIC, 0.25 OR 0.5 MG/DOSE,) 2 MG/1.5ML SOPN Inject 0.5 mg into the skin once a week. 2 pen  2  . valACYclovir (VALTREX) 500 MG tablet Take 500 mg by mouth daily. Started on 01-28-20 DS 30    . lidocaine (LIDODERM) 5 % Place 1 patch onto the skin daily. Remove & Discard patch within 12 hours or as directed by MD 30 patch 0  . pregabalin (LYRICA) 50 MG capsule Take 1 capsule (50 mg total) by mouth daily. (Patient not taking: Reported on 02/26/2020) 90 capsule 1  . traZODone (DESYREL) 50 MG tablet Take 1 tablet (50 mg total) by mouth at bedtime. (Patient not taking: Reported on 02/26/2020) 30 tablet 2     ROS:                                                                                                                                       ROS was performed and is negative except as noted in HPI    General Examination:                                                                                                      Blood pressure (!) 148/74, pulse (!) 54, temperature 98.4 F (36.9  C), temperature source Oral, resp. rate 17, height 5\' 7"  (1.702 m), weight 101.6 kg, SpO2 98 %.  Physical Exam  Constitutional: Appears well-developed and well-nourished.  Psych: Affect appropriate to situation Eyes: Normal external eye and conjunctiva. HENT: Normocephalic, no lesions, without obvious abnormality.   Musculoskeletal-no joint tenderness, deformity or swelling Cardiovascular: Normal rate and regular rhythm.  Respiratory: Effort normal, non-labored breathing saturations WNL on RA GI: Soft.  No distension. There is no tenderness.  Skin: WDI  Neurological Examination Mental Status: Alert, oriented, thought content appropriate.  No dysarthria noted. Speech fluent without evidence of aphasia.  Able to follow  commands without difficulty. Cranial Nerves: II:  Visual fields grossly normal,  III,IV, VI: ptosis not present, extra-ocular motions intact bilaterally, pupils equal, round, reactive to light and accommodation V,VII: smile symmetric, facial light touch/cool temp sensation decreased right face VIII: hearing normal bilaterally IX,X: uvula rises midline XI: bilateral shoulder shrug XII: midline tongue extension Motor: Right : Upper extremity   4+/5 Left:     Upper extremity   5/5  Lower extremity   4-/5  Lower extremity   5/5 Tone and bulk:normal tone throughout; no atrophy noted Sensory: cool temp and light touch decreased right side bilaterally Deep Tendon Reflexes: 2+ and symmetric biceps and patella Cerebellar: No ataxia noted Gait: deferred   Lab Results: Basic Metabolic  Panel: Recent Labs  Lab 02/26/20 0913  NA 140  K 4.0  CL 106  CO2 22  GLUCOSE 81  BUN 14  CREATININE 1.12*  CALCIUM 9.3    CBC: Recent Labs  Lab 02/26/20 0913  WBC 4.2  NEUTROABS 2.0  HGB 14.6  HCT 43.7  MCV 94.2  PLT 233   CBG: Recent Labs  Lab 02/26/20 0758  GLUCAP 77    Imaging: DG Chest 2 View  Result Date: 02/26/2020 CLINICAL DATA:  Hypertensive emergency,  infection EXAM: CHEST - 2 VIEW COMPARISON:  06/08/2019 FINDINGS: The heart size and mediastinal contours are within normal limits. Both lungs are clear. The visualized skeletal structures are unremarkable. IMPRESSION: No acute abnormality of the lungs. Electronically Signed   By: Eddie Candle M.D.   On: 02/26/2020 10:07   MR BRAIN WO CONTRAST  Result Date: 02/26/2020 CLINICAL DATA:  Right-sided numbness EXAM: MRI HEAD WITHOUT CONTRAST TECHNIQUE: Multiplanar, multiecho pulse sequences of the brain and surrounding structures were obtained without intravenous contrast. COMPARISON:  None. FINDINGS: Brain: There is a small focus of diffusion hyperintensity with mild ADC hypointensity within the left thalamus. Surrounding mild diffusion hyperintensity and ADC isointensity. There is no evidence of intracranial hemorrhage. Patchy small foci of T2 hyperintensity in the supratentorial white matter nonspecific but may reflect mild chronic microvascular ischemic changes. Ventricles and sulci are normal in size and configuration. There is no intracranial mass, mass effect, or edema. There is no hydrocephalus or extra-axial fluid collection. Vascular: Major vessel flow voids at the skull base are preserved. Skull and upper cervical spine: Normal marrow signal is preserved. Anterior cervical fusion is noted extending inferiorly from C4. Sinuses/Orbits: Paranasal sinuses are aerated. Orbits are unremarkable. Other: Sella is unremarkable.  Mastoid air cells are clear. IMPRESSION: Late acute to subacute left thalamic infarct. Probable mild chronic microvascular ischemic changes. Electronically Signed   By: Macy Mis M.D.   On: 02/26/2020 11:33       Laurey Morale, MSN, NP-C [facility] [phone]  02/26/2020, 12:05 PM   Attending physician note to follow with Assessment and plan .   Assessment: 59 y.o. female With PMH HTN, DM, hypothyroidism who presented to [facility] ED with c/o right side numbness. MRI  showed a left thalamic infarct. Full stroke-work up needed.   Stroke Risk Factors - diabetes mellitus and hypertension     Recommendations: -- BP goal : Permissive HTN upto 220/110 mmHg (for 24-48 post admission ) then gradually decrease to goal of normotension --MRI Brain  --CTA head and neck --Echocardiogram -- Start plavix -- High intensity Statin if tolerated (( unclear as to being listed as patient allergy), LDL > 70-- HgbA1c fasting lipid panel -- PT consult, OT consult, Speech consult --Telemetry monitoring --Frequent neuro checks --Stroke swallow screen  --smoking cessation   --Please page the Stroke team from 8am-4pm.   You can look them up on [email or web address]     NEUROHOSPITALIST ADDENDUM Performed a face to face diagnostic evaluation.   I have reviewed the contents of history and physical exam as documented by PA/ARNP/Resident and agree with above documentation.  I have discussed and formulated the above plan as documented. Edits to the note have been made as needed.  Lacunar thalamic infarction likely 2/2 small vessel disease due to uncontrolled risk factors, smoking-start Plavix, unclear ASA allergy.      Karena Addison Efstathios Sawin MD [facility] [phone]   If 7pm to 7am, please call on call as listed on AMION.

## 2020-02-26 NOTE — ED Notes (Signed)
This RN attempted report once.

## 2020-02-27 ENCOUNTER — Ambulatory Visit: Payer: Self-pay

## 2020-02-27 ENCOUNTER — Inpatient Hospital Stay (HOSPITAL_COMMUNITY): Payer: Medicare Other

## 2020-02-27 DIAGNOSIS — I6389 Other cerebral infarction: Secondary | ICD-10-CM

## 2020-02-27 DIAGNOSIS — I10 Essential (primary) hypertension: Secondary | ICD-10-CM

## 2020-02-27 DIAGNOSIS — I63312 Cerebral infarction due to thrombosis of left middle cerebral artery: Secondary | ICD-10-CM

## 2020-02-27 LAB — RAPID URINE DRUG SCREEN, HOSP PERFORMED
Amphetamines: NOT DETECTED
Barbiturates: NOT DETECTED
Benzodiazepines: NOT DETECTED
Cocaine: NOT DETECTED
Opiates: NOT DETECTED
Tetrahydrocannabinol: POSITIVE — AB

## 2020-02-27 LAB — LIPID PANEL
Cholesterol: 333 mg/dL — ABNORMAL HIGH (ref 0–200)
HDL: 47 mg/dL (ref >40)
LDL Cholesterol: 249 mg/dL — ABNORMAL HIGH (ref 0–99)
Total CHOL/HDL Ratio: 7.1 RATIO
Triglycerides: 184 mg/dL — ABNORMAL HIGH (ref <150)
VLDL: 37 mg/dL (ref 0–40)

## 2020-02-27 LAB — GLUCOSE, CAPILLARY
Glucose-Capillary: 89 mg/dL (ref 70–99)
Glucose-Capillary: 190 mg/dL — ABNORMAL HIGH (ref 70–99)
Glucose-Capillary: 133 mg/dL — ABNORMAL HIGH (ref 70–99)
Glucose-Capillary: 100 mg/dL — ABNORMAL HIGH (ref 70–99)

## 2020-02-27 LAB — ECHOCARDIOGRAM COMPLETE
Height: 67 in
Weight: 3584 oz

## 2020-02-27 LAB — URINE CULTURE: Culture: 10000 — AB

## 2020-02-27 LAB — T3: T3, Total: 103 ng/dL (ref 71–180)

## 2020-02-27 MED ORDER — FLUTICASONE PROPIONATE 50 MCG/ACT NA SUSP
1.0000 | Freq: Every day | NASAL | Status: DC
  Administered 2020-02-27 – 2020-02-28 (×2): 1 via NASAL
  Filled 2020-02-27: qty 16

## 2020-02-27 MED ORDER — CYCLOBENZAPRINE HCL 10 MG PO TABS: 5.0000 mg | ORAL_TABLET | Freq: Three times a day (TID) | ORAL | Status: DC | PRN

## 2020-02-27 MED ORDER — ROSUVASTATIN CALCIUM 20 MG PO TABS
40.0000 mg | ORAL_TABLET | Freq: Every day | ORAL | Status: DC
  Administered 2020-02-28: 40 mg via ORAL
  Filled 2020-02-27: qty 2

## 2020-02-27 MED ORDER — ASPIRIN EC 81 MG PO TBEC
81.0000 mg | DELAYED_RELEASE_TABLET | Freq: Once | ORAL | Status: AC
  Administered 2020-02-27: 81 mg via ORAL
  Filled 2020-02-27: qty 1

## 2020-02-27 MED ORDER — TRAZODONE HCL 50 MG PO TABS
50.0000 mg | ORAL_TABLET | Freq: Every evening | ORAL | Status: DC | PRN
  Administered 2020-02-27: 50 mg via ORAL
  Filled 2020-02-27: qty 1

## 2020-02-27 NOTE — Evaluation (Signed)
Occupational Therapy Evaluation Patient Details Name: Margaret Webster MRN: [id number] DOB: 08-Feb-1961 Today's Date: 02/27/2020    History of Present Illness Margaret Webster is a 59 y.o. female with medical history significant of type 2 diabetes mellitus, hypertension, hypothyroidism, tobacco abuse presents to ER with right-sided numbness tingling sensation since 3 days. [id number] acute to subacute left thalamic infarct   Clinical Impression   This 59 yo female admitted above presents to acute OT with PLOF of being totally independent with basic and IADLs including driving and working part time. She currently is min A- setup.S. She will benefit from acute OT without need for follow up.    Follow Up Recommendations  No OT follow up    Equipment Recommendations  None recommended by OT       Precautions / Restrictions Precautions Precautions: Fall Restrictions Weight Bearing Restrictions: No      Mobility Bed Mobility Overal bed mobility: Modified Independent                Transfers Overall transfer level: Needs assistance Equipment used: None Transfers: Sit to/from Stand Sit to Stand: Min guard              Balance Overall balance assessment: Needs assistance Sitting-balance support: No upper extremity supported;Feet supported Sitting balance-Leahy Scale: Good     Standing balance support: Single extremity supported Standing balance-Leahy Scale: Poor                             ADL either performed or assessed with clinical judgement   ADL Overall ADL's : Needs assistance/impaired Eating/Feeding: Independent   Grooming: Min guard;Standing   Upper Body Bathing: Set up;Sitting   Lower Body Bathing: Min guard;Sit to/from stand   Upper Body Dressing : Set up;Sitting   Lower Body Dressing: Min guard;Sit to/from stand   Toilet Transfer: Minimal assistance;Ambulation;BSC(over tolielt)   Toileting- Water quality scientist and Hygiene: Min  guard;Sit to/from stand               Vision Baseline Vision/History: Wears glasses Wears Glasses: Reading only(but doesnt  have any) Patient Visual Report: No change from baseline Vision Assessment?: Yes Eye Alignment: Within Functional Limits Ocular Range of Motion: Within Functional Limits Alignment/Gaze Preference: Within Defined Limits Tracking/Visual Pursuits: Able to track stimulus in all quads without difficulty Saccades: Within functional limits Convergence: Within functional limits Visual Fields: No apparent deficits            Pertinent Vitals/Pain Pain Assessment: Faces Faces Pain Scale: Hurts a little bit Pain Location: both shoulder (OA) Pain Descriptors / Indicators: Aching Pain Intervention(s): Limited activity within patient's tolerance;Monitored during session     Hand Dominance Right   Extremity/Trunk Assessment Upper Extremity Assessment Upper Extremity Assessment: Overall WFL for tasks assessed           Communication Communication Communication: No difficulties   Cognition Arousal/Alertness: Awake/alert Behavior During Therapy: WFL for tasks assessed/performed Overall Cognitive Status: Within Functional Limits for tasks assessed                                                Home Living Family/patient expects to be discharged to:: Private residence Living Arrangements: (nephew and grandson) Available Help at Discharge: Family;Available 24 hours/day Type of Home: Apartment Home Access: Stairs to enter [employer]  of Steps: 3 Entrance Stairs-Rails: Can reach both Home Layout: One level     Bathroom Shower/Tub: Tub/shower unit;Curtain   Biochemist, clinical: Standard     Home Equipment: None          Prior Functioning/Environment Level of Independence: Independent        Comments: Drives; works as a Secretary/administrator Problem List: Impaired balance (sitting and/or standing);Pain       OT Treatment/Interventions: Self-care/ADL training;DME and/or AE instruction;Patient/family education;Balance training    OT Goals(Current goals can be found in the care plan section) Acute Rehab OT Goals Patient Stated Goal: to go home and to [facility] for Mother's Day OT Goal Formulation: With patient Time For Goal Achievement: 03/12/20 Potential to Achieve Goals: Good  OT Frequency: Min 2X/week           Co-evaluation PT/OT/SLP Co-Evaluation/Treatment: Yes Reason for Co-Treatment: To address functional/ADL transfers PT goals addressed during session: Mobility/safety with mobility;Balance;Strengthening/ROM OT goals addressed during session: Strengthening/ROM;ADL's and self-care      AM-PAC OT "6 Clicks" Daily Activity     Outcome Measure Help from another person eating meals?: None Help from another person taking care of personal grooming?: A Little Help from another person toileting, which includes using toliet, bedpan, or urinal?: A Little Help from another person bathing (including washing, rinsing, drying)?: A Little Help from another person to put on and taking off regular upper body clothing?: A Little Help from another person to put on and taking off regular lower body clothing?: A Little 6 Click Score: 19   End of Session Equipment Utilized During Treatment: Gait belt  Activity Tolerance: Patient tolerated treatment well Patient left: in chair;with call bell/phone within reach;with chair alarm set  OT Visit Diagnosis: Unsteadiness on feet (R26.81);Other abnormalities of gait and mobility (R26.89);Muscle weakness (generalized) (M62.81);Pain Pain - Right/Left: (both) Pain - part of body: Shoulder                Time: [id number] OT Time Calculation (min): 31 min Charges:  OT General Charges $OT Visit: 1 Visit OT Evaluation $OT Eval Moderate Complexity: 1 Mod  [address]en [address], OTR/L Acute [employer] Pager [phone] Office [phone]     Almon Register 02/27/2020, 3:51 PM

## 2020-02-27 NOTE — Evaluation (Signed)
Physical Therapy Evaluation Patient Details Name: Margaret Webster MRN: [id number] DOB: [birth year] Today's Date: 02/27/2020   History of Present Illness  Margaret Webster is a 59 y.o. female with medical history significant of type 2 diabetes mellitus, hypertension, hypothyroidism, tobacco abuse presents to ER with right-sided numbness tingling sensation since 3 days. [id number] acute to subacute left thalamic infarct  Clinical Impression   Pt admitted with above diagnosis. Patient independent and working in Academic librarian. She currently has altered sensation in RLE with decr balance and gait deviations leading to drifting to her right--requried min assist to correct. She has good family support (24 hr) and is determined to return to her previous baseline.  Pt currently with functional limitations due to the deficits listed below (see PT Problem List). Pt will benefit from skilled PT to increase their independence and safety with mobility to allow discharge to the venue listed below.       Follow Up Recommendations Outpatient PT;Supervision for mobility/OOB    Equipment Recommendations  Other (comment)(TBA; ?RW vs cane vs none)    Recommendations for Other Services       Precautions / Restrictions Precautions Precautions: Fall Restrictions Weight Bearing Restrictions: No      Mobility  Bed Mobility Overal bed mobility: Modified Independent                Transfers Overall transfer level: Needs assistance Equipment used: None Transfers: Sit to/from Stand Sit to Stand: Min guard         General transfer comment: using very wide BOS for stability  Ambulation/Gait Ambulation/Gait assistance: Min assist Gait Distance (Feet): 120 Feet Assistive device: None;1 person hand held assist Gait Pattern/deviations: Step-through pattern;Decreased stride length;Drifts right/left;Wide base of support     General Gait Details: steady initial 40 feet and then began  drifting to her right; occasional stagger step with pt independently correcting balance; last 50 ft of ambulation with +1 HHA  Stairs            Wheelchair Mobility    Modified Rankin (Stroke Patients Only) Modified Rankin (Stroke Patients Only) Pre-Morbid Rankin Score: No symptoms Modified Rankin: Moderately severe disability     Balance Overall balance assessment: Needs assistance Sitting-balance support: No upper extremity supported;Feet supported Sitting balance-Leahy Scale: Good     Standing balance support: Single extremity supported Standing balance-Leahy Scale: Poor                               Pertinent Vitals/Pain Pain Assessment: Faces Faces Pain Scale: Hurts a little bit Pain Location: both shoulder (OA) Pain Descriptors / Indicators: Aching Pain Intervention(s): Limited activity within patient's tolerance;Monitored during session    Home Living Family/patient expects to be discharged to:: Private residence Living Arrangements: Other relatives(nephew and grandson) Available Help at Discharge: Family;Available 24 hours/day Type of Home: Apartment Home Access: Stairs to enter Entrance Stairs-Rails: Can reach both Entrance Stairs-Number of Steps: 3 Home Layout: One level Home Equipment: None      Prior Function Level of Independence: Independent         Comments: Drives; works as a Financial trader: Right    Extremity/Trunk Assessment   Upper Extremity Assessment Upper Extremity Assessment: Defer to OT evaluation    Lower Extremity Assessment Lower Extremity Assessment: RLE deficits/detail RLE Deficits / Details: reports weird feeling across the top of her foot, up to  her ankle; strength 5/5     Cervical / Trunk Assessment Cervical / Trunk Assessment: Other exceptions Cervical / Trunk Exceptions: obesity  Communication   Communication: No difficulties  Cognition Arousal/Alertness:  Awake/alert Behavior During Therapy: WFL for tasks assessed/performed Overall Cognitive Status: No family/caregiver present to determine baseline cognitive functioning                                        General Comments      Exercises     Assessment/Plan    PT Assessment Patient needs continued PT services  PT Problem List Decreased strength;Decreased activity tolerance;Decreased balance;Decreased mobility;Decreased cognition;Decreased knowledge of use of DME;Decreased safety awareness;Decreased knowledge of precautions;Impaired sensation;Obesity;Pain       PT Treatment Interventions DME instruction;Gait training;Stair training;Functional mobility training;Therapeutic activities;Balance training;Neuromuscular re-education;Cognitive remediation;Patient/family education    PT Goals (Current goals can be found in the Care Plan section)  Acute Rehab PT Goals Patient Stated Goal: to go home and to [facility] for [facility] Day PT Goal Formulation: With patient Time For Goal Achievement: 03/05/20 Potential to Achieve Goals: Good    Frequency Min 4X/week   Barriers to discharge        Co-evaluation   Reason for Co-Treatment: To address functional/ADL transfers PT goals addressed during session: Mobility/safety with mobility;Balance;Strengthening/ROM OT goals addressed during session: Strengthening/ROM;ADL's and self-care       AM-PAC PT "6 Clicks" Mobility  Outcome Measure Help needed turning from your back to your side while in a flat bed without using bedrails?: None Help needed moving from lying on your back to sitting on the side of a flat bed without using bedrails?: None Help needed moving to and from a bed to a chair (including a wheelchair)?: A Little Help needed standing up from a chair using your arms (e.g., wheelchair or bedside chair)?: A Little Help needed to walk in hospital room?: A Little Help needed climbing 3-5 steps with a railing? : A  Little 6 Click Score: 20    End of Session Equipment Utilized During Treatment: Gait belt Activity Tolerance: Patient tolerated treatment well Patient left: in chair;with call bell/phone within reach;with chair alarm set;Other (comment)(with OT) Nurse Communication: Mobility status PT Visit Diagnosis: Unsteadiness on feet (R26.81);Other abnormalities of gait and mobility (R26.89);Hemiplegia and hemiparesis Hemiplegia - Right/Left: Right Hemiplegia - dominant/non-dominant: Dominant Hemiplegia - caused by: Cerebral infarction    Time: 1445-1500 PT Time Calculation (min) (ACUTE ONLY): 15 min   Charges:   PT Evaluation $PT Eval Low Complexity: 1 Low           Arby Barrette, PT Pager [phone]   Rexanne Mano 02/27/2020, 5:57 PM

## 2020-02-27 NOTE — Progress Notes (Signed)
PROGRESS NOTE    Margaret Webster  [id number] DOB: 05-Nov-1960 DOA: 02/26/2020 PCP: [name], FNP    Brief Narrative:  59 year old female with history of type 2 diabetes currently not on treatment, hypertension currently not on treatment, hypothyroidism, smoker, CKD stage II presented to the emergency room with 2 days of intermittent right facial tingling, right upper extremity and lower extremity numbness. In the emergency room, hemodynamically stable.  MRI showed subacute left thalamic stroke.  Admitted with acute stroke.   Assessment & Plan:   Principal Problem:   CVA (cerebral vascular accident) ([place]) Active Problems:   DM (diabetes mellitus) ([place])   Hypertension   CKD (chronic kidney disease) stage 3, GFR 30-59 ml/min   Bradycardia   Elevated TSH  Acute ischemic stroke: Clinical findings, right facial sensory loss and right-sided hemiparesis. CT head findings, no acute findings. MRI of the brain, left thalamic infarct, acute to subacute, chronic microvascular ischemic changes. CTA of the head and neck, no major stenosis. 2D echocardiogram, ordered today. Antiplatelet therapy, not on any treatment at home.  Started on Plavix. She was told by her kidney doctor not to take aspirin to prevent kidney disease, allergy removed from her list.  Advised that she could take baby aspirin. LDL is 247, used to be on a statin and she tells me that she was taken off the cholesterol medicine because her cholesterol was too low.  Started on Crestor. Hemoglobin A1c, 5.9.  Currently not on treatment.  Will advise Metformin on discharge. Therapy recommendations, pending.  Essential hypertension: Untreated.  Acceptable today.  We will start patient on antihypertensives gradually to control her blood pressures.  No need for treatment today.  Hyperlipidemia: LDL 247.  Started on Crestor.  She has tolerated a statin in the past.  Hypothyroidism: By report.  Not on treatment.  TSH is poor.   No indication for treatment.  Smoker: Counseled to quit.  She is motivated.  DVT prophylaxis: Lovenox subcu Code Status: Full code Family Communication: None Disposition Plan: Status is: Inpatient  Remains inpatient appropriate because:Inpatient level of care appropriate due to severity of illness   Dispo: The patient is from: Home              Anticipated d/c is to: [facility]              Anticipated d/c date is: 1 day              Patient currently is not medically stable to d/c.          Consultants:   Neurology  Procedures:   None  Antimicrobials:   None   Subjective: Patient seen and examined.  Persistent numbness on her right face.  Numbness and weakness on her right arm more than right leg.  Also has some cramping on her right side of the chest otherwise no other complaints. Patient states that she had ongoing symptoms on and off for the last 3 days. She is not really allergic to aspirin, her kidney doctor asked her not to take.  Objective: Vitals:   02/26/20 2000 02/26/20 2349 02/27/20 0434 02/27/20 0824  BP: (!) 163/84 (!) 157/84 (!) 153/68 124/73  Pulse: (!) 55 (!) 58 61 66  Resp: 18 18 18 19   Temp: (!) 97.4 F (36.3 C) 98.3 F (36.8 C) 98.4 F (36.9 C) 98.3 F (36.8 C)  TempSrc: Oral Oral Oral Oral  SpO2: 99% 97% 95% 95%  Weight:      Height:  No intake or output data in the 24 hours ending 02/27/20 1010 Filed Weights   02/26/20 0800  Weight: 101.6 kg    Examination:  General exam: Appears calm and comfortable, on room air Respiratory system: Clear to auscultation. Respiratory effort normal. Cardiovascular system: S1 & S2 heard, RRR. No JVD, murmurs, rubs, gallops or clicks. No pedal edema. Gastrointestinal system: Abdomen is nondistended, soft and nontender. No organomegaly or masses felt. Normal bowel sounds heard. Central nervous system: Alert and oriented.  Right face, loss of superficial sensation. Right upper extremity, right  lower extremity 4/5. Left upper and lower extremity normal. Skin: No rashes, lesions or ulcers Psychiatry: Judgement and insight appear normal. Mood & affect appropriate.     Data Reviewed: I have personally reviewed following labs and imaging studies  CBC: Recent Labs  Lab 02/26/20 0913 02/26/20 1500  WBC 4.2 4.5  NEUTROABS 2.0  --   HGB 14.6 14.7  HCT 43.7 43.8  MCV 94.2 93.8  PLT 233 [id number]   Basic Metabolic Panel: Recent Labs  Lab 02/26/20 0913 02/26/20 1500  NA 140  --   K 4.0  --   CL 106  --   CO2 22  --   GLUCOSE 81  --   BUN 14  --   CREATININE 1.12* 1.01*  CALCIUM 9.3  --    GFR: Estimated Creatinine Clearance: 72.6 mL/min (A) (by C-G formula based on SCr of 1.01 mg/dL (H)). Liver Function Tests: Recent Labs  Lab 02/26/20 0913  AST 22  ALT 17  ALKPHOS 60  BILITOT 0.9  PROT 7.2  ALBUMIN 3.6   No results for input(s): LIPASE, AMYLASE in the last 168 hours. No results for input(s): AMMONIA in the last 168 hours. Coagulation Profile: Recent Labs  Lab 02/26/20 0913  INR 1.0   Cardiac Enzymes: No results for input(s): CKTOTAL, CKMB, CKMBINDEX, TROPONINI in the last 168 hours. BNP (last 3 results) No results for input(s): PROBNP in the last 8760 hours. HbA1C: Recent Labs    02/26/20 1500  HGBA1C 5.9*   CBG: Recent Labs  Lab 02/26/20 0758 02/26/20 1802 02/26/20 2351 02/27/20 0606  GLUCAP 77 126* 93 89   Lipid Profile: Recent Labs    02/27/20 0234  CHOL 333*  HDL 47  LDLCALC 249*  TRIG 184*  CHOLHDL 7.1   Thyroid Function Tests: Recent Labs    02/26/20 0913 02/26/20 1500  TSH 4.660*  --   FREET4  --  0.74   Anemia Panel: No results for input(s): VITAMINB12, FOLATE, FERRITIN, TIBC, IRON, RETICCTPCT in the last 72 hours. Sepsis Labs: No results for input(s): PROCALCITON, LATICACIDVEN in the last 168 hours.  Recent Results (from the past 240 hour(s))  Urine culture     Status: Abnormal   Collection Time: 02/26/20  9:13 AM     Specimen: Urine, Random  Result Value Ref Range Status   Specimen Description URINE, RANDOM  Final   Special Requests   Final    NONE Performed at [facility], [address], [place], [place] [address]    Culture (A)  Final    10,000 COLONIES/mL MULTIPLE SPECIES PRESENT, SUGGEST RECOLLECTION   Report Status 02/27/2020 FINAL  Final  Respiratory Panel by RT PCR (Flu A&B, Covid) - Nasopharyngeal Swab     Status: None   Collection Time: 02/26/20 12:35 PM   Specimen: Nasopharyngeal Swab  Result Value Ref Range Status   SARS Coronavirus 2 by RT PCR NEGATIVE NEGATIVE Final  Comment: (NOTE) SARS-CoV-2 target nucleic acids are NOT DETECTED. The SARS-CoV-2 RNA is generally detectable in upper respiratoy specimens during the acute phase of infection. The lowest concentration of SARS-CoV-2 viral copies this assay can detect is 131 copies/mL. A negative result does not preclude SARS-Cov-2 infection and should not be used as the sole basis for treatment or other patient management decisions. A negative result may occur with  improper specimen collection/handling, submission of specimen other than nasopharyngeal swab, presence of viral mutation(s) within the areas targeted by this assay, and inadequate number of viral copies (<131 copies/mL). A negative result must be combined with clinical observations, patient history, and epidemiological information. The expected result is Negative. Fact Sheet for Patients:  PinkCheek.be Fact Sheet for Healthcare Providers:  GravelBags.it This test is not yet ap proved or cleared by the [place] FDA and  has been authorized for detection and/or diagnosis of SARS-CoV-2 by FDA under an Emergency Use Authorization (EUA). This EUA will remain  in effect (meaning this test can be used) for the duration of the COVID-19 declaration under Section 564(b)(1) of the Act, 21 U.S.C. section  360bbb-3(b)(1), unless the authorization is terminated or revoked sooner.    Influenza A by PCR NEGATIVE NEGATIVE Final   Influenza B by PCR NEGATIVE NEGATIVE Final    Comment: (NOTE) The Xpert Xpress SARS-CoV-2/FLU/RSV assay is intended as an aid in  the diagnosis of influenza from Nasopharyngeal swab specimens and  should not be used as a sole basis for treatment. Nasal washings and  aspirates are unacceptable for Xpert Xpress SARS-CoV-2/FLU/RSV  testing. Fact Sheet for Patients: PinkCheek.be Fact Sheet for Healthcare Providers: GravelBags.it This test is not yet approved or cleared by the [place] FDA and  has been authorized for detection and/or diagnosis of SARS-CoV-2 by  FDA under an Emergency Use Authorization (EUA). This EUA will remain  in effect (meaning this test can be used) for the duration of the  Covid-19 declaration under Section 564(b)(1) of the Act, 21  U.S.C. section 360bbb-3(b)(1), unless the authorization is  terminated or revoked. Performed at [facility], [place] [address], [place], [place] [address]          Radiology Studies: CT ANGIO HEAD W OR WO CONTRAST  Result Date: 02/26/2020 CLINICAL DATA:  Stroke follow-up right-sided numbness. EXAM: CT ANGIOGRAPHY HEAD AND NECK TECHNIQUE: Multidetector CT imaging of the head and neck was performed using the standard protocol during bolus administration of intravenous contrast. Multiplanar CT image reconstructions and MIPs were obtained to evaluate the vascular anatomy. Carotid stenosis measurements (when applicable) are obtained utilizing NASCET criteria, using the distal internal carotid diameter as the denominator. CONTRAST:  38mL OMNIPAQUE IOHEXOL 350 MG/ML SOLN COMPARISON:  MRI head 02/26/2020 FINDINGS: CT HEAD FINDINGS Brain: No evidence of acute infarction, hemorrhage, hydrocephalus, extra-axial collection or mass lesion/mass effect. Small infarct  left thalamus on MRI not well seen on CT. Vascular: Negative for hyperdense vessel Skull: Negative Sinuses: Negative Orbits: Negative Review of the MIP images confirms the above findings CTA NECK FINDINGS Aortic arch: Standard branching. Imaged portion shows no evidence of aneurysm or dissection. No significant stenosis of the major arch vessel origins. Right carotid system: Right carotid widely patent without significant stenosis. Mild noncalcified plaque left carotid bulb. Left carotid system: Atherosclerotic calcified plaque proximal left internal carotid artery narrowing the lumen by less than 25% diameter stenosis. Vertebral arteries: Both vertebral arteries widely patent. Skeleton: No acute skeletal abnormality.  ACDF C4 through C7 Other neck:  Negative for mass or adenopathy. Upper chest: Lung apices clear bilaterally. Review of the MIP images confirms the above findings CTA HEAD FINDINGS Anterior circulation: Cavernous carotid widely patent bilaterally with minimal atherosclerotic disease. Anterior and middle cerebral arteries widely patent bilaterally. 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 Posterior circulation: Both vertebral arteries patent to the basilar. PICA patent bilaterally. Basilar widely patent. Superior cerebellar and posterior cerebral arteries patent bilaterally without stenosis. Venous sinuses: Normal Anatomic variants: None Review of the MIP images confirms the above findings IMPRESSION: 1. Negative CT head. Small left thalamic infarct on MRI not visualized on CT. 2. No significant carotid or vertebral artery stenosis in the neck. Mild atherosclerotic disease proximal left internal carotid artery 3. No intracranial large vessel occlusion or flow limiting stenosis 4. 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 Electronically Signed   By: Franchot Gallo M.D.   On: 02/26/2020 14:07   DG Chest 2 View  Result Date: 02/26/2020 CLINICAL DATA:  Hypertensive emergency, infection EXAM: CHEST -  2 VIEW COMPARISON:  06/08/2019 FINDINGS: The heart size and mediastinal contours are within normal limits. Both lungs are clear. The visualized skeletal structures are unremarkable. IMPRESSION: No acute abnormality of the lungs. Electronically Signed   By: Eddie Candle M.D.   On: 02/26/2020 10:07   CT ANGIO NECK W OR WO CONTRAST  Result Date: 02/26/2020 CLINICAL DATA:  Stroke follow-up right-sided numbness. EXAM: CT ANGIOGRAPHY HEAD AND NECK TECHNIQUE: Multidetector CT imaging of the head and neck was performed using the standard protocol during bolus administration of intravenous contrast. Multiplanar CT image reconstructions and MIPs were obtained to evaluate the vascular anatomy. Carotid stenosis measurements (when applicable) are obtained utilizing NASCET criteria, using the distal internal carotid diameter as the denominator. CONTRAST:  47mL OMNIPAQUE IOHEXOL 350 MG/ML SOLN COMPARISON:  MRI head 02/26/2020 FINDINGS: CT HEAD FINDINGS Brain: No evidence of acute infarction, hemorrhage, hydrocephalus, extra-axial collection or mass lesion/mass effect. Small infarct left thalamus on MRI not well seen on CT. Vascular: Negative for hyperdense vessel Skull: Negative Sinuses: Negative Orbits: Negative Review of the MIP images confirms the above findings CTA NECK FINDINGS Aortic arch: Standard branching. Imaged portion shows no evidence of aneurysm or dissection. No significant stenosis of the major arch vessel origins. Right carotid system: Right carotid widely patent without significant stenosis. Mild noncalcified plaque left carotid bulb. Left carotid system: Atherosclerotic calcified plaque proximal left internal carotid artery narrowing the lumen by less than 25% diameter stenosis. Vertebral arteries: Both vertebral arteries widely patent. Skeleton: No acute skeletal abnormality.  ACDF C4 through C7 Other neck: Negative for mass or adenopathy. Upper chest: Lung apices clear bilaterally. Review of the MIP images  confirms the above findings CTA HEAD FINDINGS Anterior circulation: Cavernous carotid widely patent bilaterally with minimal atherosclerotic disease. Anterior and middle cerebral arteries widely patent bilaterally. 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 Posterior circulation: Both vertebral arteries patent to the basilar. PICA patent bilaterally. Basilar widely patent. Superior cerebellar and posterior cerebral arteries patent bilaterally without stenosis. Venous sinuses: Normal Anatomic variants: None Review of the MIP images confirms the above findings IMPRESSION: 1. Negative CT head. Small left thalamic infarct on MRI not visualized on CT. 2. No significant carotid or vertebral artery stenosis in the neck. Mild atherosclerotic disease proximal left internal carotid artery 3. No intracranial large vessel occlusion or flow limiting stenosis 4. 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 Electronically Signed   By: Franchot Gallo M.D.   On: 02/26/2020 14:07  MR BRAIN WO CONTRAST  Result Date: 02/26/2020 CLINICAL DATA:  Right-sided numbness EXAM: MRI HEAD WITHOUT CONTRAST TECHNIQUE: Multiplanar, multiecho pulse sequences of the brain and surrounding structures were obtained without intravenous contrast. COMPARISON:  None. FINDINGS: Brain: There is a small focus of diffusion hyperintensity with mild ADC hypointensity within the left thalamus. Surrounding mild diffusion hyperintensity and ADC isointensity. There is no evidence of intracranial hemorrhage. Patchy small foci of T2 hyperintensity in the supratentorial white matter nonspecific but may reflect mild chronic microvascular ischemic changes. Ventricles and sulci are normal in size and configuration. There is no intracranial mass, mass effect, or edema. There is no hydrocephalus or extra-axial fluid collection. Vascular: Major vessel flow voids at the skull base are preserved. Skull and upper cervical spine: Normal marrow signal is preserved. Anterior  cervical fusion is noted extending inferiorly from C4. Sinuses/Orbits: Paranasal sinuses are aerated. Orbits are unremarkable. Other: Sella is unremarkable.  Mastoid air cells are clear. IMPRESSION: Late acute to subacute left thalamic infarct. Probable mild chronic microvascular ischemic changes. Electronically Signed   By: Macy Mis M.D.   On: 02/26/2020 11:33        Scheduled Meds: .  stroke: mapping our early stages of recovery book   Does not apply Once  . clopidogrel  75 mg Oral Daily  . enoxaparin (LOVENOX) injection  40 mg Subcutaneous Daily  . fluticasone  1 spray Each Nare Daily  . insulin aspart  0-15 Units Subcutaneous TID WC  . insulin aspart  0-5 Units Subcutaneous QHS  . rosuvastatin  20 mg Oral Daily   Continuous Infusions:   LOS: 1 day    Time spent: 30 minutes     Barb Merino, MD [facility] Pager [phone]

## 2020-02-27 NOTE — Progress Notes (Signed)
Echocardiogram 2D Echocardiogram has been performed.  Oneal Deputy Nuria Phebus 02/27/2020, 10:31 AM

## 2020-02-27 NOTE — Social Work (Signed)
CSW met with pt at bedside. CSW introduced self and explained her role at the hospital. CSW completed sbirt with pt. Pt scored a 3 on the sbirt scale. Pt states she will have alcohol a couple times a month and will have 1-3 alcohol beverages. Pt stated she des not have a problem with alcohol use. Pt states she smokes marjuana a couple times a month. Pt stated she does nt have a problem with her marijuana use and no interest in stopping.   CSW and pt discussed the effects of alcohol and marijuana on pts health. CSW offered resources, pt denied needing them at this time.   Emeterio Reeve, Latanya Presser, [place] Social Worker [phone]

## 2020-02-27 NOTE — Progress Notes (Signed)
Patient arrived to unit A/O. Vital signs taken, patient assessed. Telemetry initiated and verified. Bed left in lowest position, alarm on, call bell within in reach. Daughter and boyfriend at bedside. Will continue to monitor. Gwendolyn Grant, RN

## 2020-02-27 NOTE — Progress Notes (Signed)
STROKE TEAM PROGRESS NOTE   INTERVAL HISTORY No family is at the bedside.  Pt sitting in chair stated that her right side weakness has resolved except right ankle weakness and still has slight numbness on the right. She agrees to quit smoking. Denies heavy drinking, endorse takes THC for pain. Her LDL is very high and she agree to try crestor. She said she may have some intolerance with lipitor in the past but not remember what side effect she had with lipitor.  Vitals:   02/26/20 2349 02/27/20 0434 02/27/20 0824 02/27/20 1239  BP: (!) 157/84 (!) 153/68 124/73 (!) 141/65  Pulse: (!) 58 61 66 65  Resp: 18 18 19 19   Temp: 98.3 F (36.8 C) 98.4 F (36.9 C) 98.3 F (36.8 C) 98.1 F (36.7 C)  TempSrc: Oral Oral Oral Oral  SpO2: 97% 95% 95% 100%  Weight:      Height:        CBC:  Recent Labs  Lab 02/26/20 0913 02/26/20 1500  WBC 4.2 4.5  NEUTROABS 2.0  --   HGB 14.6 14.7  HCT 43.7 43.8  MCV 94.2 93.8  PLT 233 [id number]    Basic Metabolic Panel:  Recent Labs  Lab 02/26/20 0913 02/26/20 1500  NA 140  --   K 4.0  --   CL 106  --   CO2 22  --   GLUCOSE 81  --   BUN 14  --   CREATININE 1.12* 1.01*  CALCIUM 9.3  --    Lipid Panel:     Component Value Date/Time   CHOL 333 (H) 02/27/2020 0234   TRIG 184 (H) 02/27/2020 0234   HDL 47 02/27/2020 0234   CHOLHDL 7.1 02/27/2020 0234   VLDL 37 02/27/2020 0234   LDLCALC 249 (H) 02/27/2020 0234   HgbA1c:  Lab Results  Component Value Date   HGBA1C 5.9 (H) 02/26/2020   Urine Drug Screen:     Component Value Date/Time   LABOPIA NONE DETECTED 02/27/2020 0056   COCAINSCRNUR NONE DETECTED 02/27/2020 0056   LABBENZ NONE DETECTED 02/27/2020 0056   AMPHETMU NONE DETECTED 02/27/2020 0056   THCU POSITIVE (A) 02/27/2020 0056   LABBARB NONE DETECTED 02/27/2020 0056    Alcohol Level No results found for: ETH  IMAGING past 24 hours CT ANGIO HEAD W OR WO CONTRAST  Result Date: 02/26/2020 CLINICAL DATA:  Stroke follow-up right-sided  numbness. EXAM: CT ANGIOGRAPHY HEAD AND NECK TECHNIQUE: Multidetector CT imaging of the head and neck was performed using the standard protocol during bolus administration of intravenous contrast. Multiplanar CT image reconstructions and MIPs were obtained to evaluate the vascular anatomy. Carotid stenosis measurements (when applicable) are obtained utilizing NASCET criteria, using the distal internal carotid diameter as the denominator. CONTRAST:  74mL OMNIPAQUE IOHEXOL 350 MG/ML SOLN COMPARISON:  MRI head 02/26/2020 FINDINGS: CT HEAD FINDINGS Brain: No evidence of acute infarction, hemorrhage, hydrocephalus, extra-axial collection or mass lesion/mass effect. Small infarct left thalamus on MRI not well seen on CT. Vascular: Negative for hyperdense vessel Skull: Negative Sinuses: Negative Orbits: Negative Review of the MIP images confirms the above findings CTA NECK FINDINGS Aortic arch: Standard branching. Imaged portion shows no evidence of aneurysm or dissection. No significant stenosis of the major arch vessel origins. Right carotid system: Right carotid widely patent without significant stenosis. Mild noncalcified plaque left carotid bulb. Left carotid system: Atherosclerotic calcified plaque proximal left internal carotid artery narrowing the lumen by less than 25% diameter stenosis. Vertebral arteries: Both  vertebral arteries widely patent. Skeleton: No acute skeletal abnormality.  ACDF C4 through C7 Other neck: Negative for mass or adenopathy. Upper chest: Lung apices clear bilaterally. Review of the MIP images confirms the above findings CTA HEAD FINDINGS Anterior circulation: Cavernous carotid widely patent bilaterally with minimal atherosclerotic disease. Anterior and middle cerebral arteries widely patent bilaterally. 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 Posterior circulation: Both vertebral arteries patent to the basilar. PICA patent bilaterally. Basilar widely patent. Superior cerebellar and  posterior cerebral arteries patent bilaterally without stenosis. Venous sinuses: Normal Anatomic variants: None Review of the MIP images confirms the above findings IMPRESSION: 1. Negative CT head. Small left thalamic infarct on MRI not visualized on CT. 2. No significant carotid or vertebral artery stenosis in the neck. Mild atherosclerotic disease proximal left internal carotid artery 3. No intracranial large vessel occlusion or flow limiting stenosis 4. 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 Electronically Signed   By: Franchot Gallo M.D.   On: 02/26/2020 14:07   CT ANGIO NECK W OR WO CONTRAST  Result Date: 02/26/2020 CLINICAL DATA:  Stroke follow-up right-sided numbness. EXAM: CT ANGIOGRAPHY HEAD AND NECK TECHNIQUE: Multidetector CT imaging of the head and neck was performed using the standard protocol during bolus administration of intravenous contrast. Multiplanar CT image reconstructions and MIPs were obtained to evaluate the vascular anatomy. Carotid stenosis measurements (when applicable) are obtained utilizing NASCET criteria, using the distal internal carotid diameter as the denominator. CONTRAST:  68mL OMNIPAQUE IOHEXOL 350 MG/ML SOLN COMPARISON:  MRI head 02/26/2020 FINDINGS: CT HEAD FINDINGS Brain: No evidence of acute infarction, hemorrhage, hydrocephalus, extra-axial collection or mass lesion/mass effect. Small infarct left thalamus on MRI not well seen on CT. Vascular: Negative for hyperdense vessel Skull: Negative Sinuses: Negative Orbits: Negative Review of the MIP images confirms the above findings CTA NECK FINDINGS Aortic arch: Standard branching. Imaged portion shows no evidence of aneurysm or dissection. No significant stenosis of the major arch vessel origins. Right carotid system: Right carotid widely patent without significant stenosis. Mild noncalcified plaque left carotid bulb. Left carotid system: Atherosclerotic calcified plaque proximal left internal carotid artery narrowing  the lumen by less than 25% diameter stenosis. Vertebral arteries: Both vertebral arteries widely patent. Skeleton: No acute skeletal abnormality.  ACDF C4 through C7 Other neck: Negative for mass or adenopathy. Upper chest: Lung apices clear bilaterally. Review of the MIP images confirms the above findings CTA HEAD FINDINGS Anterior circulation: Cavernous carotid widely patent bilaterally with minimal atherosclerotic disease. Anterior and middle cerebral arteries widely patent bilaterally. 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 Posterior circulation: Both vertebral arteries patent to the basilar. PICA patent bilaterally. Basilar widely patent. Superior cerebellar and posterior cerebral arteries patent bilaterally without stenosis. Venous sinuses: Normal Anatomic variants: None Review of the MIP images confirms the above findings IMPRESSION: 1. Negative CT head. Small left thalamic infarct on MRI not visualized on CT. 2. No significant carotid or vertebral artery stenosis in the neck. Mild atherosclerotic disease proximal left internal carotid artery 3. No intracranial large vessel occlusion or flow limiting stenosis 4. 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 Electronically Signed   By: Franchot Gallo M.D.   On: 02/26/2020 14:07   ECHOCARDIOGRAM COMPLETE  Result Date: 02/27/2020    ECHOCARDIOGRAM REPORT   Patient Name:   [facility] Date of Exam: 02/27/2020 Medical Rec #:  [id number]         Height:       67.0 in Accession #:  [id number]        Weight:       224.0 lb Date of Birth:  1961/05/10         BSA:          2.122 m Patient Age:    58 years          BP:           124/73 mmHg Patient Gender: F                 HR:           59 bpm. Exam Location:  Inpatient Procedure: 2D Echo, Color Doppler and Cardiac Doppler Indications:    Stroke i163.9  History:        Patient has no prior history of Echocardiogram examinations.                 Risk Factors:Hypertension and Diabetes.  Sonographer:    Raquel Sarna  Senior RDCS Referring Phys: [phone] Michell Heinrich [facility]  Sonographer Comments: Suboptimal apical window and suboptimal subcostal window. IMPRESSIONS  1. Left ventricular ejection fraction, by estimation, is 60 to 65%. The left ventricle has normal function. The left ventricle has no regional wall motion abnormalities. Left ventricular diastolic parameters are consistent with Grade I diastolic dysfunction (impaired relaxation).  2. Right ventricular systolic function is normal. The right ventricular size is normal. There is normal pulmonary artery systolic pressure.  3. The mitral valve is normal in structure. Trivial mitral valve regurgitation. No evidence of mitral stenosis.  4. The aortic valve is normal in structure. Aortic valve regurgitation is not visualized. No aortic stenosis is present.  5. The inferior vena cava is normal in size with greater than 50% respiratory variability, suggesting right atrial pressure of 3 mmHg. Conclusion(s)/Recommendation(s): No intracardiac source of embolism detected on this transthoracic study. A transesophageal echocardiogram is recommended to exclude cardiac source of embolism if clinically indicated. FINDINGS  Left Ventricle: Left ventricular ejection fraction, by estimation, is 60 to 65%. The left ventricle has normal function. The left ventricle has no regional wall motion abnormalities. The left ventricular internal cavity size was normal in size. There is  no left ventricular hypertrophy. Left ventricular diastolic parameters are consistent with Grade I diastolic dysfunction (impaired relaxation). Right Ventricle: The right ventricular size is normal. No increase in right ventricular wall thickness. Right ventricular systolic function is normal. There is normal pulmonary artery systolic pressure. The tricuspid regurgitant velocity is 2.40 m/s, and  with an assumed right atrial pressure of 3 mmHg, the estimated right ventricular systolic pressure is [id number] mmHg. Left Atrium:  Left atrial size was normal in size. Right Atrium: Right atrial size was normal in size. Pericardium: There is no evidence of pericardial effusion. Mitral Valve: The mitral valve is normal in structure. Normal mobility of the mitral valve leaflets. Mild mitral annular calcification. Trivial mitral valve regurgitation. No evidence of mitral valve stenosis. Tricuspid Valve: The tricuspid valve is normal in structure. Tricuspid valve regurgitation is mild . No evidence of tricuspid stenosis. Aortic Valve: The aortic valve is normal in structure. Aortic valve regurgitation is not visualized. No aortic stenosis is present. Pulmonic Valve: The pulmonic valve was normal in structure. Pulmonic valve regurgitation is not visualized. No evidence of pulmonic stenosis. Aorta: The aortic root is normal in size and structure. Venous: The inferior vena cava is normal in size with greater than 50% respiratory variability, suggesting right atrial pressure of 3 mmHg. IAS/Shunts: No atrial level shunt detected  by color flow Doppler.  LEFT VENTRICLE PLAX 2D LVIDd:         4.24 cm  Diastology LVIDs:         3.07 cm  LV e' lateral:   11.20 cm/s LV PW:         1.25 cm  LV E/e' lateral: 7.4 LV IVS:        0.86 cm  LV e' medial:    6.74 cm/s LVOT diam:     1.80 cm  LV E/e' medial:  12.3 LV SV:         63 LV SV Index:   29 LVOT Area:     2.54 cm  RIGHT VENTRICLE RV S prime:     11.10 cm/s TAPSE (M-mode): 2.1 cm LEFT ATRIUM             Index       RIGHT ATRIUM           Index LA diam:        2.70 cm 1.27 cm/m  RA Area:     15.70 cm LA Vol (A2C):   53.3 ml 25.12 ml/m RA Volume:   36.90 ml  17.39 ml/m LA Vol (A4C):   49.8 ml 23.47 ml/m LA Biplane Vol: 55.1 ml 25.97 ml/m  AORTIC VALVE LVOT Vmax:   123.00 cm/s LVOT Vmean:  77.500 cm/s LVOT VTI:    0.246 m  AORTA Ao Root diam: 2.90 cm Ao Asc diam:  2.60 cm MITRAL VALVE               TRICUSPID VALVE MV Area (PHT): 2.99 cm    TR Peak grad:   23.0 mmHg MV Decel Time: 254 msec    TR Vmax:         240.00 cm/s MV E velocity: 82.90 cm/s MV A velocity: 92.20 cm/s  SHUNTS MV E/A ratio:  0.90        Systemic VTI:  0.25 m                            Systemic Diam: 1.80 cm Candee Furbish MD Electronically signed by Candee Furbish MD Signature Date/Time: 02/27/2020/12:32:26 PM    Final     PHYSICAL EXAM  Temp:  [97.4 F (36.3 C)-98.4 F (36.9 C)] 98.1 F (36.7 C) (05/03 1239) Pulse Rate:  [55-66] 65 (05/03 1239) Resp:  [16-20] 19 (05/03 1239) BP: (124-163)/(65-84) 141/65 (05/03 1239) SpO2:  [95 %-100 %] 100 % (05/03 1239)  General - Well nourished, well developed, in no apparent distress.  Ophthalmologic - fundi not visualized due to noncooperation.  Cardiovascular - Regular rhythm and rate.  Mental Status -  Level of arousal and orientation to time, place, and person were intact. Language including expression, naming, repetition, comprehension was assessed and found intact. Fund of Knowledge was assessed and was intact.  Cranial Nerves II - XII - II - Visual field intact OU. III, IV, VI - Extraocular movements intact. V - Facial sensation intact bilaterally. VII - Facial movement intact bilaterally. VIII - Hearing & vestibular intact bilaterally. X - Palate elevates symmetrically. XI - Chin turning & shoulder shrug intact bilaterally. XII - Tongue protrusion intact.  Motor Strength - The patient's strength was normal in all extremities and pronator drift was absent.  Bulk was normal and fasciculations were absent.   Motor Tone - Muscle tone was assessed at the neck and appendages and was normal.  Reflexes - The patient's reflexes were symmetrical in all extremities and she had no pathological reflexes.  Sensory - Light touch, temperature/pinprick were assessed and were symmetrical. She feels slight difference on the right side but equal on light touch  Coordination - The patient had normal movements in the hands with no ataxia or dysmetria.  Tremor was absent.  Gait and Station -  deferred.   ASSESSMENT/PLAN [name] is a 59 y.o. female with history of HTN, DM, hypothyroidism, cervical spinals fusion (2012) presenting with intermittent right side numbness since 02/24/20.   Stroke:   L thalamic infarct secondary to small vessel disease source  MRI  L thalamic infarct. Small vessel disease.   CT head negative   CTA head & neck no LVO. Proximal L ICA mild atherosclerosis. 83mm R periophthalmic aneurysm   2D Echo EF 60-65%. No source of embolus   LDL 249  HgbA1c 5.9  Lovenox 40 mg sq daily for VTE prophylaxis  No antithrombotics prior to admission, now on ASA 81 and clopidogrel 75 mg daily. Recommend DAPT for 3 weeks and then ASA alone.  Therapy recommendations:  pending   Disposition:  pending   Hypertension  Stable . Permissive hypertension (OK if < 220/120) but gradually normalize in 5-7 days . Long-term BP goal normotensive  Hyperlipidemia  Home meds:  No statin  Now on crestor 40  LDL 249, goal < 70  Continue statin at discharge  Diabetes type II Controlled  HgbA1c 5.9, goal < 7.0  CBGs  SSI  Close PCP follow up  Tobacco abuse  Current smoker  Smoking cessation counseling provided  Pt is willing to quit  Other Stroke Risk Factors  ETOH use, advised to drink no more than 1 drink(s) a day  Substance abuse - THC. UDS:  THC POSITIVE. Patient advised to stop using due to stroke risk.  Obesity, Body mass index is 35.08 kg/m., recommend weight loss, diet and exercise as appropriate   Other Active Problems  CKD stage 3a, Cre 1.12->1.01  Hospital day # 1  Neurology will sign off. Please call with questions. Pt will follow up with stroke clinic NP at [facility] in about 4 weeks. Thanks for the consult.  Rosalin Hawking, MD PhD Stroke Neurology 02/27/2020 3:56 PM   To contact Stroke Continuity provider, please refer to [email or web address] After hours, contact General Neurology

## 2020-02-27 NOTE — Chronic Care Management (AMB) (Signed)
  Chronic Care Management   Inpatient Admit Review Note  02/27/2020 Name: Margaret Webster MRN: [id number] DOB: [birth year]  Margaret Webster is a 59 y.o. year old female who is a primary care patient of [name], [place]. Margaret Webster is actively engaged with the embedded care management team in the primary care practice and is being followed by RN Case Manager and BSW for assistance with disease management and care coordination needs related to HTN.   Margaret Webster is currently admitted to the hospital for evaluation and treatment of HTN and CVA with right sided weakness.  Plan: CM team will collaborate with [facility] and will follow patient post discharge.    Daneen Schick, BSW, CDP Social Worker, Certified Dementia Practitioner [place] [facility] [place] [facility] [phone]

## 2020-02-27 NOTE — Evaluation (Signed)
Speech Language Pathology Evaluation Patient Details Name: Margaret Webster MRN: [id number] DOB: May 03, 1961 Today's Date: 02/27/2020 Time: [id number] SLP Time Calculation (min) (ACUTE ONLY): 22 min  Problem List:  Patient Active Problem List   Diagnosis Date Noted  . CVA (cerebral vascular accident) ([place]) 02/26/2020  . CKD (chronic kidney disease) stage 3, GFR 30-59 ml/min   . Bradycardia   . Elevated TSH   . Multiple joint pain 11/07/2019  . Acute pain of right shoulder 09/19/2019  . Right hip pain 09/19/2019  . Prediabetes 09/08/2018  . Chest pain 01/10/2015  . DM (diabetes mellitus) ([place]) 01/10/2015  . Hypertension 01/10/2015  . Hypothyroidism 01/10/2015  . Headache 01/10/2015  . Abdominal pain 01/10/2015  . Vaginitis 04/07/2014  . Pain 04/07/2014  . Vaginal irritation 04/07/2014  . Boil of buttock 04/06/2014  . BV (bacterial vaginosis) 04/06/2014  . Unspecified symptom associated with female genital organs 11/16/2013  . Candidiasis of vulva and vagina 11/16/2013  . Vaginitis and vulvovaginitis, unspecified 06/07/2013  . Pain aggravated by activities of daily living 06/07/2013  . Nonspecific abnormal finding in stool contents 12/02/2011  . Diabetes mellitus without mention of complication [id number]  . Chronic back pain    Past Medical History:  Past Medical History:  Diagnosis Date  . Arthritis    bil shoulders  . Bronchitis   . Chronic back pain   . Diabetes mellitus    stopped Mrtformin 1 mo ago, made her "sick"  . Hypertension   . Hypothyroidism   . Thyroid disease   . Trigger thumb of right hand    Past Surgical History:  Past Surgical History:  Procedure Laterality Date  . BACK SURGERY  1993  . CARPAL TUNNEL RELEASE  08/2015  . CARPAL TUNNEL RELEASE Left 09/10/2017   Procedure: CARPAL TUNNEL RELEASE;  Surgeon: Leanora Cover, MD;  Location: [facility];  Service: Orthopedics;  Laterality: Left;  . CERVICAL SPINE SURGERY Right 11/2012  .  OOPHORECTOMY    . TRIGGER FINGER RELEASE Right 08/06/2017   Procedure: RIGHT THUMB TRIGGER RELEASE;  Surgeon: Leanora Cover, MD;  Location: [facility];  Service: Orthopedics;  Laterality: Right;   HPI:  Margaret Webster is a 59 y.o. female with medical history significant of type 2 diabetes mellitus, hypertension, hypothyroidism, tobacco abuse presents to ER with right-sided numbness tingling sensation since 3 days. [id number] acute to subacute left thalamic infarct   Assessment / Plan / Recommendation Clinical Impression   Pt presents with speech distortions noted inconsistently throughout evaluation. She also had difficulty drawing the hands at the correct time on a clock, simple calculations (added when supposed to subtract despite multiple cues), and delayed recall (2 out of 5 words). Initially she tells me that all of this is her baseline, but at other times, she tells me it is not. Will likely need input from other family members. She does say that she has 24/7 supervision at home from family. SLP will f/u acutely to maximize safety and determine if additional services are needed.     SLP Assessment  SLP Recommendation/Assessment: Patient needs continued Speech Lanaguage Pathology Services SLP Visit Diagnosis: Cognitive communication deficit (R41.841)    Follow Up Recommendations  24 hour supervision/assistance; TBA further   Frequency and Duration min 2x/week  1 week      SLP Evaluation Cognition  Overall Cognitive Status: No family/caregiver present to determine baseline cognitive functioning Arousal/Alertness: Awake/alert Orientation Level: Oriented X4 Attention: Selective Selective Attention: Impaired Selective Attention  Impairment: Verbal complex Memory: Impaired Memory Impairment: Decreased recall of new information;Retrieval deficit Problem Solving: Impaired Problem Solving Impairment: Verbal complex Safety/Judgment: Appears intact       Comprehension   Auditory Comprehension Overall Auditory Comprehension: Appears within functional limits for tasks assessed    Expression Expression Primary Mode of Expression: Verbal Verbal Expression Overall Verbal Expression: Impaired Initiation: No impairment Level of Generative/Spontaneous Verbalization: Conversation Repetition: No impairment Naming: Impairment Verbal Errors: Phonemic paraphasias Pragmatics: No impairment Interfering Components: Premorbid deficit Non-Verbal Means of Communication: Not applicable Written Expression Dominant Hand: Right   Oral / Motor  Motor Speech Overall Motor Speech: Impaired Motor Planning: Impaired Level of Impairment: Word Motor Speech Errors: Inconsistent   GO                     Osie Bond., M.A. [place] [facility] Pager [phone] Office [phone]  02/27/2020, 4:37 PM

## 2020-02-28 LAB — GLUCOSE, CAPILLARY: Glucose-Capillary: 107 mg/dL — ABNORMAL HIGH (ref 70–99)

## 2020-02-28 MED ORDER — CLOPIDOGREL BISULFATE 75 MG PO TABS: 75.0000 mg | ORAL_TABLET | Freq: Every day | ORAL | 0 refills | Status: AC

## 2020-02-28 MED ORDER — AMLODIPINE BESYLATE 2.5 MG PO TABS: 2.5000 mg | ORAL_TABLET | Freq: Every day | ORAL | 11 refills | Status: DC

## 2020-02-28 MED ORDER — METFORMIN HCL 500 MG PO TABS: 500.0000 mg | ORAL_TABLET | Freq: Two times a day (BID) | ORAL | 2 refills | Status: DC

## 2020-02-28 MED ORDER — ROSUVASTATIN CALCIUM 40 MG PO TABS: 40.0000 mg | ORAL_TABLET | Freq: Every day | ORAL | 2 refills | Status: DC

## 2020-02-28 NOTE — Progress Notes (Signed)
Physical Therapy Treatment Patient Details Name: Margaret Webster MRN: [id number] DOB: 02-May-1961 Today's Date: 02/28/2020    History of Present Illness Margaret Webster is a 59 y.o. female with medical history significant of type 2 diabetes mellitus, hypertension, hypothyroidism, tobacco abuse presents to ER with right-sided numbness tingling sensation since 3 days. [id number] acute to subacute left thalamic infarct    PT Comments    Patient feeling better today with plans to discharge home. Educated on use of RW for safety, especially as she is traveling to see family this weekend. Discussed with spouse her tendency to lose her balance to her right on 02/27/20 with no device. She did not have LOB when using RW, and cautioned them that she will be more likely to drift/stagger to her right when she is more fatigued. Patient agrees to OPPT for further gait and balance training.     Follow Up Recommendations  Outpatient PT;Supervision for mobility/OOB     Equipment Recommendations  Rolling walker with 5" wheels    Recommendations for Other Services       Precautions / Restrictions Precautions Precautions: Fall Restrictions Weight Bearing Restrictions: No    Mobility  Bed Mobility                  Transfers Overall transfer level: Needs assistance Equipment used: Rolling walker (2 wheeled) Transfers: Sit to/from Stand Sit to Stand: Min guard         General transfer comment: vc for safe use of DME  Ambulation/Gait Ambulation/Gait assistance: Min guard Gait Distance (Feet): 150 Feet Assistive device: Rolling walker (2 wheeled) Gait Pattern/deviations: Step-through pattern;Decreased stride length;Wide base of support   Gait velocity interpretation: 1.31 - 2.62 ft/sec, indicative of limited community ambulator General Gait Details: educated in use of RW with no significant drift with RW; attempted without device with pt guarded with shorter step length and wider  BOS   Stairs             Wheelchair Mobility    Modified Rankin (Stroke Patients Only) Modified Rankin (Stroke Patients Only) Pre-Morbid Rankin Score: No symptoms Modified Rankin: Moderately severe disability     Balance Overall balance assessment: Needs assistance Sitting-balance support: No upper extremity supported;Feet supported Sitting balance-Leahy Scale: Good     Standing balance support: No upper extremity supported Standing balance-Leahy Scale: Fair                              Cognition Arousal/Alertness: Awake/alert Behavior During Therapy: WFL for tasks assessed/performed Overall Cognitive Status: (not specifically tested)                                        Exercises      General Comments General comments (skin integrity, edema, etc.): Patient is being discharged. She is going to visit family in [place] in 2 days. Recommend RW for home use as she may have incr right drift when fatigued (later in the day or walking longer distances).       Pertinent Vitals/Pain Pain Assessment: No/denies pain    Home Living                      Prior Function            PT Goals (current goals can now be found in the  care plan section) Acute Rehab PT Goals Patient Stated Goal: to go home and to [facility] for [facility] Day PT Goal Formulation: With patient Time For Goal Achievement: 03/05/20 Potential to Achieve Goals: Good Progress towards PT goals: Progressing toward goals    Frequency    Min 4X/week      PT Plan Current plan remains appropriate    Co-evaluation              AM-PAC PT "6 Clicks" Mobility   Outcome Measure  Help needed turning from your back to your side while in a flat bed without using bedrails?: None Help needed moving from lying on your back to sitting on the side of a flat bed without using bedrails?: None Help needed moving to and from a bed to a chair (including a wheelchair)?:  A Little Help needed standing up from a chair using your arms (e.g., wheelchair or bedside chair)?: A Little Help needed to walk in hospital room?: A Little Help needed climbing 3-5 steps with a railing? : A Little 6 Click Score: 20    End of Session Equipment Utilized During Treatment: Gait belt Activity Tolerance: Patient tolerated treatment well Patient left: in chair;with call bell/phone within reach;with family/visitor present Nurse Communication: Mobility status(OK to d/c from PT standpoint) PT Visit Diagnosis: Unsteadiness on feet (R26.81);Other abnormalities of gait and mobility (R26.89);Hemiplegia and hemiparesis Hemiplegia - Right/Left: Right Hemiplegia - dominant/non-dominant: Dominant Hemiplegia - caused by: Cerebral infarction     Time: [id number] PT Time Calculation (min) (ACUTE ONLY): 9 min  Charges:  $Gait Training: 8-22 mins                      Arby Barrette, PT Pager [phone]    Rexanne Mano 02/28/2020, 12:37 PM

## 2020-02-28 NOTE — Discharge Summary (Signed)
Physician Discharge Summary  Margaret Webster [id number] DOB: 1960-11-28 DOA: 02/26/2020  PCP: [name], FNP  Admit date: 02/26/2020 Discharge date: 02/28/2020  Admitted From: Home Disposition: Home  Recommendations for Outpatient Follow-up:  1. Follow up with PCP in 1-2 weeks 2. Follow-up at outpatient rehab center  [place]: Not applicable Equipment/Devices: Not applicable  Discharge Condition: Stable CODE STATUS: Full code Diet recommendation: Low-salt low-carb diet  Discharge summary: 59 year old female with history of type 2 diabetes, hypertension, hypothyroidism, smoker, stage II chronic kidney disease currently not on any treatment presented to the emergency room with intermittent right facial tingling, right upper extremity and lower extremity numbness going on for 3 days.  In emergency room she was hemodynamically stable with right facial sensory loss and right upper extremity weakness.  MRI of the brain showed acute/subacute left thalamic stroke.  She was admitted to the hospital with acute stroke and further work-up.  Acute ischemic stroke: Clinical findings, right facial sensory loss and right-sided hemiparesis.  Clinical symptoms mostly improved.  She does have some residual sensory loss. MRI of the brain, left thalamic infarct, acute to subacute, chronic microvascular ischemic changes. CTA of the head and neck, no major stenosis. 2D echocardiogram, normal ejection fraction.  No source of embolism.  Grade 1 diastolic dysfunction. Antiplatelet therapy, not on any treatment at home.  Aspirin 81 mg daily, Plavix 75 mg daily for 3 weeks followed by aspirin 81 mg daily indefinitely. LDL is 247, used to be on a statin and she tells me that she was taken off the cholesterol medicine because her cholesterol was too low.  Started on Crestor 40 mg daily. Hemoglobin A1c, 5.9.  Currently not on treatment.  Start Metformin on discharge. Therapy recommendations, outpatient  rehab. Blood pressure, fairly stable.  Will add low-dose amlodipine and outpatient follow-up. History of hypothyroidism currently not on treatment.  TSH and T4 within normal limits.  No indication for treatment. Smoker, counseled to quit smoking and patient is motivated.  She will use nicotine patch.  Medically stable.  Going home with new regimen for stroke prevention.  Outpatient neurology follow-up sent.    Discharge Diagnoses:  Principal Problem:   CVA (cerebral vascular accident) ([place]) Active Problems:   DM (diabetes mellitus) ([place])   Hypertension   CKD (chronic kidney disease) stage 3, GFR 30-59 ml/min   Bradycardia   Elevated TSH    Discharge Instructions  Discharge Instructions    Ambulatory referral to Neurology   Complete by: As directed    Follow up with stroke clinic NP (Jessica Vanschaick or Cecille Rubin, if both not available, consider Zachery Dauer, or [place]) at [facility] in about 4 weeks. Thanks.   Ambulatory referral to Physical Medicine Rehab   Complete by: As directed    Call MD for:  persistant dizziness or light-headedness   Complete by: As directed    Diet - low sodium heart healthy   Complete by: As directed    Diet Carb Modified   Complete by: As directed    Increase activity slowly   Complete by: As directed      Allergies as of 02/28/2020      Reactions   Sulfa Antibiotics Other (See Comments)   Hard to breathe   Ibuprofen Other (See Comments)   Stomach upset   Polytrim [polymyxin B-trimethoprim] Itching, Swelling   redness   Influenza Vaccines Hives   Lipitor [atorvastatin Calcium] Other (See Comments)   Muscle ache   Naproxen Nausea And Vomiting  Medication List    STOP taking these medications   Ozempic (0.25 or 0.5 MG/DOSE) 2 MG/1.5ML Sopn Generic drug: Semaglutide(0.25 or 0.5MG /DOS)   pregabalin 50 MG capsule Commonly known as: Lyrica   traZODone 50 MG tablet Commonly known as: DESYREL     TAKE these medications    acetaminophen 500 MG tablet Commonly known as: TYLENOL Take 1,000 mg by mouth every 6 (six) hours as needed for mild pain.   amLODipine 2.5 MG tablet Commonly known as: NORVASC Take 1 tablet (2.5 mg total) by mouth daily.   aspirin EC 81 MG tablet Take 81 mg by mouth once.   clopidogrel 75 MG tablet Commonly known as: PLAVIX Take 1 tablet (75 mg total) by mouth daily for 21 days.   diclofenac Sodium 1 % Gel Commonly known as: VOLTAREN Apply 2 g topically 4 (four) times daily.   lidocaine 5 % Commonly known as: Lidoderm Place 1 patch onto the skin daily. Remove & Discard patch within 12 hours or as directed by MD   metFORMIN 500 MG tablet Commonly known as: GLUCOPHAGE Take 1 tablet (500 mg total) by mouth 2 (two) times daily with a meal.   mometasone 50 MCG/ACT nasal spray Commonly known as: Nasonex Place 2 sprays into the nose daily.   polyvinyl alcohol 1.4 % ophthalmic solution Commonly known as: LIQUIFILM TEARS Place 1 drop into both eyes as needed for dry eyes.   rosuvastatin 40 MG tablet Commonly known as: CRESTOR Take 1 tablet (40 mg total) by mouth daily.   valACYclovir 500 MG tablet Commonly known as: VALTREX Take 500 mg by mouth daily. Started on 01-28-20 DS 30      Follow-up Information    [facility]. Schedule an appointment as soon as possible for a visit in 4 week(s).   Specialty: Neurology Contact information: [place] [phone]         Allergies  Allergen Reactions  . Sulfa Antibiotics Other (See Comments)    Hard to breathe  . Ibuprofen Other (See Comments)    Stomach upset  . Polytrim [Polymyxin B-Trimethoprim] Itching and Swelling    redness  . Influenza Vaccines Hives  . Lipitor [Atorvastatin Calcium] Other (See Comments)    Muscle ache   . Naproxen Nausea And Vomiting    Consultations:  Neurology   Procedures/Studies: CT ANGIO HEAD W OR WO  CONTRAST  Result Date: 02/26/2020 CLINICAL DATA:  Stroke follow-up right-sided numbness. EXAM: CT ANGIOGRAPHY HEAD AND NECK TECHNIQUE: Multidetector CT imaging of the head and neck was performed using the standard protocol during bolus administration of intravenous contrast. Multiplanar CT image reconstructions and MIPs were obtained to evaluate the vascular anatomy. Carotid stenosis measurements (when applicable) are obtained utilizing NASCET criteria, using the distal internal carotid diameter as the denominator. CONTRAST:  37mL OMNIPAQUE IOHEXOL 350 MG/ML SOLN COMPARISON:  MRI head 02/26/2020 FINDINGS: CT HEAD FINDINGS Brain: No evidence of acute infarction, hemorrhage, hydrocephalus, extra-axial collection or mass lesion/mass effect. Small infarct left thalamus on MRI not well seen on CT. Vascular: Negative for hyperdense vessel Skull: Negative Sinuses: Negative Orbits: Negative Review of the MIP images confirms the above findings CTA NECK FINDINGS Aortic arch: Standard branching. Imaged portion shows no evidence of aneurysm or dissection. No significant stenosis of the major arch vessel origins. Right carotid system: Right carotid widely patent without significant stenosis. Mild noncalcified plaque left carotid bulb. Left carotid system: Atherosclerotic calcified plaque proximal left internal carotid artery  narrowing the lumen by less than 25% diameter stenosis. Vertebral arteries: Both vertebral arteries widely patent. Skeleton: No acute skeletal abnormality.  ACDF C4 through C7 Other neck: Negative for mass or adenopathy. Upper chest: Lung apices clear bilaterally. Review of the MIP images confirms the above findings CTA HEAD FINDINGS Anterior circulation: Cavernous carotid widely patent bilaterally with minimal atherosclerotic disease. Anterior and middle cerebral arteries widely patent bilaterally. 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 Posterior circulation: Both vertebral arteries patent to the  basilar. PICA patent bilaterally. Basilar widely patent. Superior cerebellar and posterior cerebral arteries patent bilaterally without stenosis. Venous sinuses: Normal Anatomic variants: None Review of the MIP images confirms the above findings IMPRESSION: 1. Negative CT head. Small left thalamic infarct on MRI not visualized on CT. 2. No significant carotid or vertebral artery stenosis in the neck. Mild atherosclerotic disease proximal left internal carotid artery 3. No intracranial large vessel occlusion or flow limiting stenosis 4. 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 Electronically Signed   By: Franchot Gallo M.D.   On: 02/26/2020 14:07   DG Chest 2 View  Result Date: 02/26/2020 CLINICAL DATA:  Hypertensive emergency, infection EXAM: CHEST - 2 VIEW COMPARISON:  06/08/2019 FINDINGS: The heart size and mediastinal contours are within normal limits. Both lungs are clear. The visualized skeletal structures are unremarkable. IMPRESSION: No acute abnormality of the lungs. Electronically Signed   By: Eddie Candle M.D.   On: 02/26/2020 10:07   CT ANGIO NECK W OR WO CONTRAST  Result Date: 02/26/2020 CLINICAL DATA:  Stroke follow-up right-sided numbness. EXAM: CT ANGIOGRAPHY HEAD AND NECK TECHNIQUE: Multidetector CT imaging of the head and neck was performed using the standard protocol during bolus administration of intravenous contrast. Multiplanar CT image reconstructions and MIPs were obtained to evaluate the vascular anatomy. Carotid stenosis measurements (when applicable) are obtained utilizing NASCET criteria, using the distal internal carotid diameter as the denominator. CONTRAST:  22mL OMNIPAQUE IOHEXOL 350 MG/ML SOLN COMPARISON:  MRI head 02/26/2020 FINDINGS: CT HEAD FINDINGS Brain: No evidence of acute infarction, hemorrhage, hydrocephalus, extra-axial collection or mass lesion/mass effect. Small infarct left thalamus on MRI not well seen on CT. Vascular: Negative for hyperdense vessel Skull:  Negative Sinuses: Negative Orbits: Negative Review of the MIP images confirms the above findings CTA NECK FINDINGS Aortic arch: Standard branching. Imaged portion shows no evidence of aneurysm or dissection. No significant stenosis of the major arch vessel origins. Right carotid system: Right carotid widely patent without significant stenosis. Mild noncalcified plaque left carotid bulb. Left carotid system: Atherosclerotic calcified plaque proximal left internal carotid artery narrowing the lumen by less than 25% diameter stenosis. Vertebral arteries: Both vertebral arteries widely patent. Skeleton: No acute skeletal abnormality.  ACDF C4 through C7 Other neck: Negative for mass or adenopathy. Upper chest: Lung apices clear bilaterally. Review of the MIP images confirms the above findings CTA HEAD FINDINGS Anterior circulation: Cavernous carotid widely patent bilaterally with minimal atherosclerotic disease. Anterior and middle cerebral arteries widely patent bilaterally. 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 Posterior circulation: Both vertebral arteries patent to the basilar. PICA patent bilaterally. Basilar widely patent. Superior cerebellar and posterior cerebral arteries patent bilaterally without stenosis. Venous sinuses: Normal Anatomic variants: None Review of the MIP images confirms the above findings IMPRESSION: 1. Negative CT head. Small left thalamic infarct on MRI not visualized on CT. 2. No significant carotid or vertebral artery stenosis in the neck. Mild atherosclerotic disease proximal left internal carotid artery 3. No intracranial large vessel  occlusion or flow limiting stenosis 4. 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 Electronically Signed   By: Franchot Gallo M.D.   On: 02/26/2020 14:07   MR BRAIN WO CONTRAST  Result Date: 02/26/2020 CLINICAL DATA:  Right-sided numbness EXAM: MRI HEAD WITHOUT CONTRAST TECHNIQUE: Multiplanar, multiecho pulse sequences of the brain and surrounding  structures were obtained without intravenous contrast. COMPARISON:  None. FINDINGS: Brain: There is a small focus of diffusion hyperintensity with mild ADC hypointensity within the left thalamus. Surrounding mild diffusion hyperintensity and ADC isointensity. There is no evidence of intracranial hemorrhage. Patchy small foci of T2 hyperintensity in the supratentorial white matter nonspecific but may reflect mild chronic microvascular ischemic changes. Ventricles and sulci are normal in size and configuration. There is no intracranial mass, mass effect, or edema. There is no hydrocephalus or extra-axial fluid collection. Vascular: Major vessel flow voids at the skull base are preserved. Skull and upper cervical spine: Normal marrow signal is preserved. Anterior cervical fusion is noted extending inferiorly from C4. Sinuses/Orbits: Paranasal sinuses are aerated. Orbits are unremarkable. Other: Sella is unremarkable.  Mastoid air cells are clear. IMPRESSION: Late acute to subacute left thalamic infarct. Probable mild chronic microvascular ischemic changes. Electronically Signed   By: Macy Mis M.D.   On: 02/26/2020 11:33   US RENAL  Result Date: 02/09/2020 CLINICAL DATA:  Chronic kidney disease. EXAM: RENAL / URINARY TRACT ULTRASOUND COMPLETE COMPARISON:  08/04/2012 FINDINGS: Right Kidney: Renal measurements: 10.4 x 4.2 x 4.3 cm = volume: 98. mL . Echogenicity within normal limits. No mass or hydronephrosis visualized. Left Kidney: Renal measurements: 9.7 x 4.3 x 4.7 cm = volume: 104 mL. Echogenicity within normal limits. No mass or hydronephrosis visualized. Bladder: Appears normal for degree of bladder distention. Other: None. IMPRESSION: Normal size kidneys without hydronephrosis. Electronically Signed   By: Marin Olp M.D.   On: 02/09/2020 16:16   ECHOCARDIOGRAM COMPLETE  Result Date: 02/27/2020    ECHOCARDIOGRAM REPORT   Patient Name:   [facility] Date of Exam: 02/27/2020 Medical Rec #:   [id number]         Height:       67.0 in Accession #:    [id number]        Weight:       224.0 lb Date of Birth:  [birth year]         BSA:          2.122 m Patient Age:    80 years          BP:           124/73 mmHg Patient Gender: F                 HR:           59 bpm. Exam Location:  Inpatient Procedure: 2D Echo, Color Doppler and Cardiac Doppler Indications:    Stroke i163.9  History:        Patient has no prior history of Echocardiogram examinations.                 Risk Factors:Hypertension and Diabetes.  Sonographer:    Raquel Sarna Senior RDCS Referring Phys: [phone] Michell Heinrich [facility]  Sonographer Comments: Suboptimal apical window and suboptimal subcostal window. IMPRESSIONS  1. Left ventricular ejection fraction, by estimation, is 60 to 65%. The left ventricle has normal function. The left ventricle has no regional wall motion abnormalities. Left ventricular diastolic parameters are consistent with Grade I diastolic dysfunction (impaired relaxation).  2. Right ventricular systolic function is normal. The right ventricular size is normal. There is normal pulmonary artery systolic pressure.  3. The mitral valve is normal in structure. Trivial mitral valve regurgitation. No evidence of mitral stenosis.  4. The aortic valve is normal in structure. Aortic valve regurgitation is not visualized. No aortic stenosis is present.  5. The inferior vena cava is normal in size with greater than 50% respiratory variability, suggesting right atrial pressure of 3 mmHg. Conclusion(s)/Recommendation(s): No intracardiac source of embolism detected on this transthoracic study. A transesophageal echocardiogram is recommended to exclude cardiac source of embolism if clinically indicated. FINDINGS  Left Ventricle: Left ventricular ejection fraction, by estimation, is 60 to 65%. The left ventricle has normal function. The left ventricle has no regional wall motion abnormalities. The left ventricular internal cavity size was normal in size.  There is  no left ventricular hypertrophy. Left ventricular diastolic parameters are consistent with Grade I diastolic dysfunction (impaired relaxation). Right Ventricle: The right ventricular size is normal. No increase in right ventricular wall thickness. Right ventricular systolic function is normal. There is normal pulmonary artery systolic pressure. The tricuspid regurgitant velocity is 2.40 m/s, and  with an assumed right atrial pressure of 3 mmHg, the estimated right ventricular systolic pressure is [id number] mmHg. Left Atrium: Left atrial size was normal in size. Right Atrium: Right atrial size was normal in size. Pericardium: There is no evidence of pericardial effusion. Mitral Valve: The mitral valve is normal in structure. Normal mobility of the mitral valve leaflets. Mild mitral annular calcification. Trivial mitral valve regurgitation. No evidence of mitral valve stenosis. Tricuspid Valve: The tricuspid valve is normal in structure. Tricuspid valve regurgitation is mild . No evidence of tricuspid stenosis. Aortic Valve: The aortic valve is normal in structure. Aortic valve regurgitation is not visualized. No aortic stenosis is present. Pulmonic Valve: The pulmonic valve was normal in structure. Pulmonic valve regurgitation is not visualized. No evidence of pulmonic stenosis. Aorta: The aortic root is normal in size and structure. Venous: The inferior vena cava is normal in size with greater than 50% respiratory variability, suggesting right atrial pressure of 3 mmHg. IAS/Shunts: No atrial level shunt detected by color flow Doppler.  LEFT VENTRICLE PLAX 2D LVIDd:         4.24 cm  Diastology LVIDs:         3.07 cm  LV e' lateral:   11.20 cm/s LV PW:         1.25 cm  LV E/e' lateral: 7.4 LV IVS:        0.86 cm  LV e' medial:    6.74 cm/s LVOT diam:     1.80 cm  LV E/e' medial:  12.3 LV SV:         63 LV SV Index:   29 LVOT Area:     2.54 cm  RIGHT VENTRICLE RV S prime:     11.10 cm/s TAPSE (M-mode): 2.1 cm  LEFT ATRIUM             Index       RIGHT ATRIUM           Index LA diam:        2.70 cm 1.27 cm/m  RA Area:     15.70 cm LA Vol (A2C):   53.3 ml 25.12 ml/m RA Volume:   36.90 ml  17.39 ml/m LA Vol (A4C):   49.8 ml 23.47 ml/m LA Biplane Vol: 55.1 ml 25.97 ml/m  AORTIC VALVE LVOT Vmax:   123.00 cm/s LVOT Vmean:  77.500 cm/s LVOT VTI:    0.246 m  AORTA Ao Root diam: 2.90 cm Ao Asc diam:  2.60 cm MITRAL VALVE               TRICUSPID VALVE MV Area (PHT): 2.99 cm    TR Peak grad:   23.0 mmHg MV Decel Time: 254 msec    TR Vmax:        240.00 cm/s MV E velocity: 82.90 cm/s MV A velocity: 92.20 cm/s  SHUNTS MV E/A ratio:  0.90        Systemic VTI:  0.25 m                            Systemic Diam: 1.80 cm Candee Furbish MD Electronically signed by Candee Furbish MD Signature Date/Time: 02/27/2020/12:32:26 PM    Final      Subjective: Patient seen and examined.  No overnight events.  The hand and leg numbness is mostly improved.  Still has some loss of sensation on her right side of the face.  Denies any other complaints.   Discharge Exam: Vitals:   02/28/20 0344 02/28/20 0751  BP: 136/78 122/80  Pulse: (!) 59 62  Resp: 14 17  Temp: 98.2 F (36.8 C) 98.4 F (36.9 C)  SpO2: 97% 98%   Vitals:   02/27/20 2013 02/27/20 2347 02/28/20 0344 02/28/20 0751  BP: 133/70 121/77 136/78 122/80  Pulse: (!) 58 69 (!) 59 62  Resp: 18 18 14 17   Temp: 98.1 F (36.7 C) 98.1 F (36.7 C) 98.2 F (36.8 C) 98.4 F (36.9 C)  TempSrc: Oral Oral Oral Oral  SpO2: 97% 97% 97% 98%  Weight:      Height:        General: Pt is alert, awake, not in acute distress Cardiovascular: RRR, S1/S2 +, no rubs, no gallops Respiratory: CTA bilaterally, no wheezing, no rhonchi Abdominal: Soft, NT, ND, bowel sounds + Extremities: no edema, no cyanosis Superficial sensory loss on the right face.  No other obvious neurological deficits.    The results of significant diagnostics from this hospitalization (including imaging,  microbiology, ancillary and laboratory) are listed below for reference.     Microbiology: Recent Results (from the past 240 hour(s))  Urine culture     Status: Abnormal   Collection Time: 02/26/20  9:13 AM   Specimen: Urine, Random  Result Value Ref Range Status   Specimen Description URINE, RANDOM  Final   Special Requests   Final    NONE Performed at [facility], [address] [place], [place] [address]    Culture (A)  Final    10,000 COLONIES/mL MULTIPLE SPECIES PRESENT, SUGGEST RECOLLECTION   Report Status 02/27/2020 FINAL  Final  Respiratory Panel by RT PCR (Flu A&B, Covid) - Nasopharyngeal Swab     Status: None   Collection Time: 02/26/20 12:35 PM   Specimen: Nasopharyngeal Swab  Result Value Ref Range Status   SARS Coronavirus 2 by RT PCR NEGATIVE NEGATIVE Final    Comment: (NOTE) SARS-CoV-2 target nucleic acids are NOT DETECTED. The SARS-CoV-2 RNA is generally detectable in upper respiratoy specimens during the acute phase of infection. The lowest concentration of SARS-CoV-2 viral copies this assay can detect is 131 copies/mL. A negative result does not preclude SARS-Cov-2 infection and should not be used as the sole basis for treatment or other patient  management decisions. A negative result may occur with  improper specimen collection/handling, submission of specimen other than nasopharyngeal swab, presence of viral mutation(s) within the areas targeted by this assay, and inadequate number of viral copies (<131 copies/mL). A negative result must be combined with clinical observations, patient history, and epidemiological information. The expected result is Negative. Fact Sheet for Patients:  PinkCheek.be Fact Sheet for Healthcare Providers:  GravelBags.it This test is not yet ap proved or cleared by the [place] FDA and  has been authorized for detection and/or diagnosis of SARS-CoV-2 by FDA  under an Emergency Use Authorization (EUA). This EUA will remain  in effect (meaning this test can be used) for the duration of the COVID-19 declaration under Section 564(b)(1) of the Act, 21 U.S.C. section 360bbb-3(b)(1), unless the authorization is terminated or revoked sooner.    Influenza A by PCR NEGATIVE NEGATIVE Final   Influenza B by PCR NEGATIVE NEGATIVE Final    Comment: (NOTE) The Xpert Xpress SARS-CoV-2/FLU/RSV assay is intended as an aid in  the diagnosis of influenza from Nasopharyngeal swab specimens and  should not be used as a sole basis for treatment. Nasal washings and  aspirates are unacceptable for Xpert Xpress SARS-CoV-2/FLU/RSV  testing. Fact Sheet for Patients: PinkCheek.be Fact Sheet for Healthcare Providers: GravelBags.it This test is not yet approved or cleared by the [place] FDA and  has been authorized for detection and/or diagnosis of SARS-CoV-2 by  FDA under an Emergency Use Authorization (EUA). This EUA will remain  in effect (meaning this test can be used) for the duration of the  Covid-19 declaration under Section 564(b)(1) of the Act, 21  U.S.C. section 360bbb-3(b)(1), unless the authorization is  terminated or revoked. Performed at [facility], [place] [address] [place], [place] [address]      Labs: BNP (last 3 results) No results for input(s): BNP in the last 8760 hours. Basic Metabolic Panel: Recent Labs  Lab 02/26/20 0913 02/26/20 1500  NA 140  --   K 4.0  --   CL 106  --   CO2 22  --   GLUCOSE 81  --   BUN 14  --   CREATININE 1.12* 1.01*  CALCIUM 9.3  --    Liver Function Tests: Recent Labs  Lab 02/26/20 0913  AST 22  ALT 17  ALKPHOS 60  BILITOT 0.9  PROT 7.2  ALBUMIN 3.6   No results for input(s): LIPASE, AMYLASE in the last 168 hours. No results for input(s): AMMONIA in the last 168 hours. CBC: Recent Labs  Lab 02/26/20 0913 02/26/20 1500  WBC  4.2 4.5  NEUTROABS 2.0  --   HGB 14.6 14.7  HCT 43.7 43.8  MCV 94.2 93.8  PLT 233 245   Cardiac Enzymes: No results for input(s): CKTOTAL, CKMB, CKMBINDEX, TROPONINI in the last 168 hours. BNP: Invalid input(s): POCBNP CBG: Recent Labs  Lab 02/27/20 0606 02/27/20 1245 02/27/20 1620 02/27/20 2110 02/28/20 0614  GLUCAP 89 190* 133* 100* 107*   D-Dimer No results for input(s): DDIMER in the last 72 hours. Hgb A1c Recent Labs    02/26/20 1500  HGBA1C 5.9*   Lipid Profile Recent Labs    02/27/20 0234  CHOL 333*  HDL 47  LDLCALC 249*  TRIG 184*  CHOLHDL 7.1   Thyroid function studies Recent Labs    02/26/20 0913  TSH 4.660*   Anemia work up No results for input(s): VITAMINB12, FOLATE, FERRITIN, TIBC, IRON, RETICCTPCT in the last  72 hours. Urinalysis    Component Value Date/Time   COLORURINE YELLOW 02/26/2020 0918   APPEARANCEUR HAZY (A) 02/26/2020 0918   LABSPEC 1.006 02/26/2020 0918   PHURINE 7.0 02/26/2020 0918   GLUCOSEU NEGATIVE 02/26/2020 0918   HGBUR NEGATIVE 02/26/2020 0918   BILIRUBINUR NEGATIVE 02/26/2020 0918   BILIRUBINUR negative 09/19/2019 1731   KETONESUR NEGATIVE 02/26/2020 0918   PROTEINUR NEGATIVE 02/26/2020 0918   UROBILINOGEN 0.2 09/19/2019 1731   UROBILINOGEN 0.2 08/12/2015 1230   NITRITE NEGATIVE 02/26/2020 0918   LEUKOCYTESUR NEGATIVE 02/26/2020 0918   Sepsis Labs Invalid input(s): PROCALCITONIN,  WBC,  LACTICIDVEN Microbiology Recent Results (from the past 240 hour(s))  Urine culture     Status: Abnormal   Collection Time: 02/26/20  9:13 AM   Specimen: Urine, Random  Result Value Ref Range Status   Specimen Description URINE, RANDOM  Final   Special Requests   Final    NONE Performed at [facility], [place] [address], [place], [place] [address]    Culture (A)  Final    10,000 COLONIES/mL MULTIPLE SPECIES PRESENT, SUGGEST RECOLLECTION   Report Status 02/27/2020 FINAL  Final  Respiratory Panel by RT PCR (Flu A&B,  Covid) - Nasopharyngeal Swab     Status: None   Collection Time: 02/26/20 12:35 PM   Specimen: Nasopharyngeal Swab  Result Value Ref Range Status   SARS Coronavirus 2 by RT PCR NEGATIVE NEGATIVE Final    Comment: (NOTE) SARS-CoV-2 target nucleic acids are NOT DETECTED. The SARS-CoV-2 RNA is generally detectable in upper respiratoy specimens during the acute phase of infection. The lowest concentration of SARS-CoV-2 viral copies this assay can detect is 131 copies/mL. A negative result does not preclude SARS-Cov-2 infection and should not be used as the sole basis for treatment or other patient management decisions. A negative result may occur with  improper specimen collection/handling, submission of specimen other than nasopharyngeal swab, presence of viral mutation(s) within the areas targeted by this assay, and inadequate number of viral copies (<131 copies/mL). A negative result must be combined with clinical observations, patient history, and epidemiological information. The expected result is Negative. Fact Sheet for Patients:  PinkCheek.be Fact Sheet for Healthcare Providers:  GravelBags.it This test is not yet ap proved or cleared by the [place] FDA and  has been authorized for detection and/or diagnosis of SARS-CoV-2 by FDA under an Emergency Use Authorization (EUA). This EUA will remain  in effect (meaning this test can be used) for the duration of the COVID-19 declaration under Section 564(b)(1) of the Act, 21 U.S.C. section 360bbb-3(b)(1), unless the authorization is terminated or revoked sooner.    Influenza A by PCR NEGATIVE NEGATIVE Final   Influenza B by PCR NEGATIVE NEGATIVE Final    Comment: (NOTE) The Xpert Xpress SARS-CoV-2/FLU/RSV assay is intended as an aid in  the diagnosis of influenza from Nasopharyngeal swab specimens and  should not be used as a sole basis for treatment. Nasal washings and   aspirates are unacceptable for Xpert Xpress SARS-CoV-2/FLU/RSV  testing. Fact Sheet for Patients: PinkCheek.be Fact Sheet for Healthcare Providers: GravelBags.it This test is not yet approved or cleared by the [place] FDA and  has been authorized for detection and/or diagnosis of SARS-CoV-2 by  FDA under an Emergency Use Authorization (EUA). This EUA will remain  in effect (meaning this test can be used) for the duration of the  Covid-19 declaration under Section 564(b)(1) of the Act, 21  U.S.C. section 360bbb-3(b)(1), unless the authorization  is  terminated or revoked. Performed at [facility], [place] [address] [place], [place] [address]      Time coordinating discharge:  40 minutes  SIGNED:   Barb Merino, MD  [facility] 02/28/2020, 8:52 AM

## 2020-02-28 NOTE — Plan of Care (Signed)
Patient stable, discussed POC with patient and spouse, agreeable with plan. Discussed stroke education via stroke handbook via teach back, verbalizes understanding, denies question/concerns at this time.    Problem: Education: Goal: Knowledge of General Education information will improve Description: Including pain rating scale, medication(s)/side effects and non-pharmacologic comfort measures Outcome: Adequate for Discharge   Problem: Health Behavior/Discharge Planning: Goal: Ability to manage health-related needs will improve Outcome: Adequate for Discharge   Problem: Clinical Measurements: Goal: Ability to maintain clinical measurements within normal limits will improve Outcome: Adequate for Discharge Goal: Will remain [name] from infection Outcome: Adequate for Discharge Goal: Diagnostic test results will improve Outcome: Adequate for Discharge Goal: Respiratory complications will improve Outcome: Adequate for Discharge Goal: Cardiovascular complication will be avoided Outcome: Adequate for Discharge   Problem: Activity: Goal: Risk for activity intolerance will decrease Outcome: Adequate for Discharge   Problem: Nutrition: Goal: Adequate nutrition will be maintained Outcome: Adequate for Discharge   Problem: Coping: Goal: Level of anxiety will decrease Outcome: Adequate for Discharge   Problem: Elimination: Goal: Will not experience complications related to bowel motility Outcome: Adequate for Discharge Goal: Will not experience complications related to urinary retention Outcome: Adequate for Discharge   Problem: Pain Managment: Goal: General experience of comfort will improve Outcome: Adequate for Discharge   Problem: Safety: Goal: Ability to remain [name] from injury will improve Outcome: Adequate for Discharge   Problem: Skin Integrity: Goal: Risk for impaired skin integrity will decrease Outcome: Adequate for Discharge   Problem: Education: Goal: Knowledge of  disease or condition will improve Outcome: Adequate for Discharge Goal: Knowledge of secondary prevention will improve Outcome: Adequate for Discharge Goal: Knowledge of patient specific risk factors addressed and post discharge goals established will improve Outcome: Adequate for Discharge Goal: Individualized Educational Video(s) Outcome: Adequate for Discharge   Problem: Coping: Goal: Will verbalize positive feelings about self Outcome: Adequate for Discharge   Problem: Health Behavior/Discharge Planning: Goal: Ability to manage health-related needs will improve Outcome: Adequate for Discharge   Problem: Self-Care: Goal: Ability to participate in self-care as condition permits will improve Outcome: Adequate for Discharge Goal: Verbalization of feelings and concerns over difficulty with self-care will improve Outcome: Adequate for Discharge   Problem: Ischemic Stroke/TIA Tissue Perfusion: Goal: Complications of ischemic stroke/TIA will be minimized Outcome: Adequate for Discharge

## 2020-02-28 NOTE — TOC Transition Note (Signed)
Transition of Care [facility]) - CM/SW Discharge Note   Patient Details  Name: Margaret Webster MRN: [id number] Date of Birth: January 05, 1961  [facility] CM/SW Contact:  Pollie Friar, RN Phone Number: 02/28/2020, 11:25 AM   Clinical Narrative:    Pt discharging home with outpatient therapy. CM met with the patient and she prefers to attend outpatient on [facility]. Orders in [place] and information on the AVS.  Pt states she will have transportation to the outpatient sessions. Walker needed for home. [employer] will deliver the walker to the room. Pt has supervision at home and transportation to home.    Final next level of care: OP Rehab Barriers to Discharge: No Barriers Identified   Patient Goals and CMS Choice     Choice offered to / list presented to : Patient  Discharge Placement                       Discharge Plan and Services                DME Arranged: Walker rolling DME Agency: [employer] Date DME Agency Contacted: 02/28/20   Representative spoke with at DME Agency: [place] ([place]) Interventions     Readmission Risk Interventions No flowsheet data found.

## 2020-02-28 NOTE — Progress Notes (Signed)
D/C instructions provided to patient, denies questions/concerns at this time. Patient transported to front entrance via WC, tol well. 

## 2020-03-01 ENCOUNTER — Telehealth: Payer: Self-pay

## 2020-03-01 NOTE — Telephone Encounter (Signed)
This nurse attempted to contact patient in order to schedule hospital follow up appointment. Voice mailbox was full and I was unable to leave a message.

## 2020-03-01 NOTE — Telephone Encounter (Signed)
Transition Care Management Follow-up Telephone Call  Date of discharge and from where: [place] 02/28/20  How have you been since you were released from the hospital? she's been okay  Any questions or concerns? no  Items Reviewed:  Did the pt receive and understand the discharge instructions provided? yes  Medications obtained and verified? yes  Any new allergies since your discharge? no  Dietary orders reviewed? Low sodium diet  Do you have support at home? yes  Other (ie: DME, Home Health, etc) no  Functional Questionnaire: (I = Independent and D = Dependent) ADL's: I  Bathing/Dressing- I   Meal Prep- I  Eating- I  Maintaining continence- I  Transferring/Ambulation- I  Managing Meds- I   Follow up appointments reviewed:    PCP Hospital f/u appt confirmed? Scheduled to see [name] DNP,FNP-BC on 03/06/20 @ 11:30am  Specialist Hospital f/u appt confirmed? Doesn't know she is currently not home  Are transportation arrangements needed?no  If their condition worsens, is the pt aware to call  their PCP or go to the ED? yes  Was the patient provided with contact information for the PCP's office or ED? yes  Was the pt encouraged to call back with questions or concerns? yes

## 2020-03-05 ENCOUNTER — Encounter: Payer: Self-pay | Admitting: Nurse Practitioner

## 2020-03-06 ENCOUNTER — Encounter: Payer: Self-pay | Admitting: Nurse Practitioner

## 2020-03-06 ENCOUNTER — Ambulatory Visit (INDEPENDENT_AMBULATORY_CARE_PROVIDER_SITE_OTHER): Payer: Medicare Other | Admitting: Nurse Practitioner

## 2020-03-06 ENCOUNTER — Other Ambulatory Visit: Payer: Self-pay

## 2020-03-06 VITALS — BP 118/84 | HR 66 | Temp 98.7°F | Ht 67.0 in | Wt 221.4 lb

## 2020-03-06 DIAGNOSIS — F17200 Nicotine dependence, unspecified, uncomplicated: Secondary | ICD-10-CM

## 2020-03-06 DIAGNOSIS — R7303 Prediabetes: Secondary | ICD-10-CM

## 2020-03-06 DIAGNOSIS — M25571 Pain in right ankle and joints of right foot: Secondary | ICD-10-CM | POA: Diagnosis not present

## 2020-03-06 DIAGNOSIS — E7800 Pure hypercholesterolemia, unspecified: Secondary | ICD-10-CM | POA: Diagnosis not present

## 2020-03-06 DIAGNOSIS — Z716 Tobacco abuse counseling: Secondary | ICD-10-CM

## 2020-03-06 DIAGNOSIS — I6389 Other cerebral infarction: Secondary | ICD-10-CM | POA: Diagnosis not present

## 2020-03-06 MED ORDER — BUPROPION HCL ER (XL) 150 MG PO TB24: 150.0000 mg | ORAL_TABLET | ORAL | 2 refills | Status: DC

## 2020-03-06 NOTE — Progress Notes (Signed)
This visit occurred during the SARS-CoV-2 public health emergency.  Safety protocols were in place, including screening questions prior to the visit, additional usage of staff PPE, and extensive cleaning of exam room while observing appropriate contact time as indicated for disinfecting solutions.  Subjective:     Patient ID: Margaret Webster , female    DOB: [birth year] , 59 y.o.   MRN: [id number]   Chief Complaint  Patient presents with  . Hospitalization Follow-up    Pinky toe on (R) foot     HPI  Here for hospital follow up from 5/2 - 5/4 after presenting to the emergency room with intermittent right facial tingling, right upper extremity and lower extremity numbness going on for 3 days.  She was hemodynamically stable in the emergency room with right facial sensory loss and right upper extremity weakness.  Her MRI of the brain revealed an acute/subacute left thalamic stroke.  She was admitted to the hospital with acute stroke and further work-up.  She was mostly improved before being discharged.  She is to take Aspirin 81 mg daily, Plavix 75 mg daily for 3 weeks followed by aspirin 81 mg daily indefinitely.  She had previously been on a statin but had been off for some time due to being low. She was also started back on metformin during her hospitalization.  She was to start using nicotine patch to quit smoking.   She is to start PT but has not heard from them at this time.    She is reporting her mouth on the left side is feeling funny. She does report having ginger candy this am.    MRI of the brain showed acute/subacute left thalamic stroke      Past Medical History:  Diagnosis Date  . Arthritis    bil shoulders  . Bronchitis   . Chronic back pain   . Diabetes mellitus    stopped Mrtformin 1 mo ago, made her "sick"  . Hypertension   . Hypothyroidism   . Thyroid disease   . Trigger thumb of right hand      Family History  Problem Relation Age of Onset  . Diabetes  Mother   . Cystic fibrosis Sister   . Cancer Sister        ?  Marland Kitchen Heart disease Maternal Grandmother   . Clotting disorder Maternal Grandfather      Current Outpatient Medications:  .  acetaminophen (TYLENOL) 500 MG tablet, Take 1,000 mg by mouth every 6 (six) hours as needed for mild pain., Disp: , Rfl:  .  amLODipine (NORVASC) 2.5 MG tablet, Take 1 tablet (2.5 mg total) by mouth daily., Disp: 30 tablet, Rfl: 11 .  aspirin EC 81 MG tablet, Take 81 mg by mouth once., Disp: , Rfl:  .  clopidogrel (PLAVIX) 75 MG tablet, Take 1 tablet (75 mg total) by mouth daily for 21 days., Disp: 21 tablet, Rfl: 0 .  diclofenac Sodium (VOLTAREN) 1 % GEL, Apply 2 g topically 4 (four) times daily., Disp: 100 g, Rfl: 5 .  lidocaine (LIDODERM) 5 %, Place 1 patch onto the skin daily. Remove & Discard patch within 12 hours or as directed by MD, Disp: 30 patch, Rfl: 0 .  metFORMIN (GLUCOPHAGE) 500 MG tablet, Take 1 tablet (500 mg total) by mouth 2 (two) times daily with a meal., Disp: 60 tablet, Rfl: 2 .  mometasone (NASONEX) 50 MCG/ACT nasal spray, Place 2 sprays into the nose daily., Disp: 17 g, Rfl: 2 .  polyvinyl alcohol (LIQUIFILM TEARS) 1.4 % ophthalmic solution, Place 1 drop into both eyes as needed for dry eyes., Disp: , Rfl:  .  rosuvastatin (CRESTOR) 40 MG tablet, Take 1 tablet (40 mg total) by mouth daily., Disp: 30 tablet, Rfl: 2 .  valACYclovir (VALTREX) 500 MG tablet, Take 500 mg by mouth daily. Started on 01-28-20 DS 30, Disp: , Rfl:    Allergies  Allergen Reactions  . Sulfa Antibiotics Other (See Comments)    Hard to breathe  . Ibuprofen Other (See Comments)    Stomach upset  . Polytrim [Polymyxin B-Trimethoprim] Itching and Swelling    redness  . Influenza Vaccines Hives  . Lipitor [Atorvastatin Calcium] Other (See Comments)    Muscle ache   . Naproxen Nausea And Vomiting     Review of Systems  Constitutional: Negative.   Respiratory: Negative.   Cardiovascular: Negative.  Negative for  chest pain, palpitations and leg swelling.  Musculoskeletal: Positive for arthralgias (right toe pain).  Neurological: Negative for dizziness, facial asymmetry and headaches.  Psychiatric/Behavioral: Negative.      Today's Vitals   03/06/20 1131  BP: 118/84  Pulse: 66  Temp: 98.7 F (37.1 C)  TempSrc: Oral  SpO2: 94%  Weight: 221 lb 6.4 oz (100.4 kg)  Height: 5\' 7"  (1.702 m)   Body mass index is 34.68 kg/m.   Objective:  Physical Exam Constitutional:      General: She is not in acute distress.    Appearance: Normal appearance. She is obese.  Cardiovascular:     Rate and Rhythm: Normal rate and regular rhythm.     Pulses: Normal pulses.     Heart sounds: Normal heart sounds. No murmur heard.   Pulmonary:     Effort: Pulmonary effort is normal. No respiratory distress.     Breath sounds: Normal breath sounds.  Skin:    Capillary Refill: Capillary refill takes less than 2 seconds.  Neurological:     General: No focal deficit present.     Mental Status: She is alert and oriented to person, place, and time.     Cranial Nerves: No cranial nerve deficit.     Motor: Weakness (mild right upper extremity weakness) present.  Psychiatric:        Mood and Affect: Mood normal.        Behavior: Behavior normal.        Thought Content: Thought content normal.        Judgment: Judgment normal.         Assessment And Plan:     1. Cerebrovascular accident (CVA) due to other mechanism [facility]) TCM Performed. A member of the clinical team spoke with the patient upon dischare. Discharge summary was reviewed in full detail during the visit. Meds reconciled and compared to discharge meds. Medication list is updated and reviewed with the patient.  Greater than 50% face to face time was spent in counseling an coordination of care.  All questions were answered to the satisfaction of the patient.   She does not have as much tingling as she had initially Mild weakness to right upper  extremity - Ambulatory referral to Neurology  2. Toe joint pain, right  No swelling noted,   Will check right foot xray - DG Foot Complete Right; Future  3. Prediabetes  She is on metformin and so far tolerating well.   4. Elevated cholesterol  Cholesterol was elevated in hospital  Now on crestor 40 mg, tolerating well  5. Encounter  for tobacco use cessation counseling  She has tried chantix but did not do well, she felt like she wanted to beat every one up.  She is not smoking as much not smoking in the house.    Was considering starting buproprion but she is wanting to do nicotine patches   [name], FNP    THE PATIENT IS ENCOURAGED TO PRACTICE SOCIAL DISTANCING DUE TO THE COVID-19 PANDEMIC.

## 2020-03-07 ENCOUNTER — Ambulatory Visit: Payer: Medicare Other | Admitting: Nurse Practitioner

## 2020-03-07 ENCOUNTER — Ambulatory Visit
Admission: RE | Admit: 2020-03-07 | Discharge: 2020-03-07 | Disposition: A | Discharge status: Home / Self Care | Payer: Medicare Other | Source: Ambulatory Visit | Attending: Nurse Practitioner | Admitting: Nurse Practitioner

## 2020-03-07 DIAGNOSIS — S92514A Nondisplaced fracture of proximal phalanx of right lesser toe(s), initial encounter for closed fracture: Secondary | ICD-10-CM | POA: Diagnosis not present

## 2020-03-07 DIAGNOSIS — M25571 Pain in right ankle and joints of right foot: Secondary | ICD-10-CM

## 2020-03-07 DIAGNOSIS — E7800 Pure hypercholesterolemia, unspecified: Secondary | ICD-10-CM

## 2020-03-08 ENCOUNTER — Other Ambulatory Visit: Payer: Self-pay

## 2020-03-08 ENCOUNTER — Ambulatory Visit: Payer: Medicare Other | Attending: Internal Medicine

## 2020-03-08 VITALS — BP 142/80

## 2020-03-08 DIAGNOSIS — I69351 Hemiplegia and hemiparesis following cerebral infarction affecting right dominant side: Secondary | ICD-10-CM | POA: Insufficient documentation

## 2020-03-08 DIAGNOSIS — R2689 Other abnormalities of gait and mobility: Secondary | ICD-10-CM | POA: Diagnosis not present

## 2020-03-08 DIAGNOSIS — M6281 Muscle weakness (generalized): Secondary | ICD-10-CM | POA: Insufficient documentation

## 2020-03-08 NOTE — Therapy (Signed)
[facility] [address] [place], [place], [address] Phone: [phone]   Fax:  [phone]   Physical Therapy Evaluation  Patient Details  Name: Margaret Webster MRN: [id number] Date of Birth: 04-11-1961 Referring Provider (PT): Referred by Barb Merino but will be following with PCP [name]   Encounter Date: 03/08/2020  PT End of Session - 03/08/20 0902    Visit Number  1    Number of Visits  17    Date for PT Re-Evaluation  0000000   90 day cert but 60 day poc   Authorization Type  [employer] medicare so 10th visit progress note, FOTO    PT Start Time  0845    PT Stop Time  0930    PT Time Calculation (min)  45 min    Equipment Utilized During Treatment  --   RW   Activity Tolerance  Patient limited by fatigue    Behavior During Therapy  [facility] for tasks assessed/performed       Past Medical History:  Diagnosis Date  . Arthritis    bil shoulders  . Bronchitis   . Chronic back pain   . Diabetes mellitus    stopped Mrtformin 1 mo ago, made her "sick"  . Hypertension   . Hypothyroidism   . Thyroid disease   . Trigger thumb of right hand     Past Surgical History:  Procedure Laterality Date  . BACK SURGERY  1993  . CARPAL TUNNEL RELEASE  08/2015  . CARPAL TUNNEL RELEASE Left 09/10/2017   Procedure: CARPAL TUNNEL RELEASE;  Surgeon: Leanora Cover, MD;  Location: [facility];  Service: Orthopedics;  Laterality: Left;  . CERVICAL SPINE SURGERY Right 11/2012  . OOPHORECTOMY    . TRIGGER FINGER RELEASE Right 08/06/2017   Procedure: RIGHT THUMB TRIGGER RELEASE;  Surgeon: Leanora Cover, MD;  Location: [place];  Service: Orthopedics;  Laterality: Right;    Vitals:   03/08/20 0850  BP: (!) 142/80     Subjective Assessment - 03/08/20 0850    Subjective  Pt hospitalized [birth year]. 59 year old female with history of type 2 diabetes, hypertension, hypothyroidism, smoker, stage II  chronic kidney disease currently not on any treatment presented to the emergency room with intermittent right facial tingling, right upper extremity and lower extremity numbness going on for 3 days.  In emergency room she was hemodynamically stable with right facial sensory loss and right upper extremity weakness.  MRI of the brain showed acute/subacute left thalamic stroke. Pt reports that she also hit her right foot on chair leg and has been sore since then. Has a weird feeling in bottom of right foot. Pt reports that strength on right and walking has not been as good. Was given walker at hospital but has not been using. Was independent working part time prior.    Pertinent History  type 2 diabetes, hypertension, hypothyroidism, smoker, stage II chronic kidney disease    Patient Stated Goals  Pt wants to be able to walk better. Also working on quitting smoking.    Currently in Pain?  Yes    Pain Score  6     Pain Location  Foot    Pain Orientation  Right    Pain Descriptors / Indicators  --   funny feeling, like meat hanging on foot   Pain Type  Acute pain    Pain Onset  1 to 4 weeks ago    Pain Frequency  Intermittent    Aggravating Factors   with shoe on    Pain Relieving Factors  not having shoe on, pressure on it    Multiple Pain Sites  Yes    Pain Location  Flank    Pain Orientation  Right    Pain Descriptors / Indicators  Spasm    Pain Type  Acute pain    Pain Onset  1 to 4 weeks ago    Pain Frequency  Intermittent    Aggravating Factors   sitting up on side of bed    Pain Relieving Factors  laying on left         [facility] PT Assessment - 03/08/20 0857      Assessment   Medical Diagnosis  L ACA CVA    Referring Provider (PT)  Referred by Barb Merino but will be following with PCP [name]    Onset Date/Surgical Date  02/26/20    Hand Dominance  Right    Prior Therapy  Acute care PT      Precautions   Precautions  Fall      Balance Screen   Has the patient fallen in  the past 6 months  No    Has the patient had a decrease in activity level because of a fear of falling?   No    Is the patient reluctant to leave their home because of a fear of falling?   No      Home Environment   Living Environment  Private residence    Living Arrangements  Non-relatives/Friends   nephew   Available Help at Discharge  Family    Type of Home  --   Duplex   [place] to enter    Entrance Stairs-Number of Steps  3    Entrance Stairs-Rails  Right;Left;Can reach both    [place]  One level    [place] - 2 wheels    Additional Comments  Grandson also in and out and daughter lives nearby      Prior Function   Level of Independence  Independent    Vocation  Part time employment    Vocation Requirements  Pt cleaned office buildings 2 days/week    Leisure  dancing, gardening, yard work      Cognition   Overall Cognitive Status  Within Functional Limits for tasks assessed    Behaviors  --   reporting she has been down and tearful at times     Observation/Other Assessments   Observations  Pt reports that she feels right eye has something in it . Peripheral vision intact       Sensation   Light Touch  Appears Intact    Additional Comments  Intact to light touch in foot but reports feels different than left.  Also getting strange sensations in bottom of right foot.      Coordination   Gross Motor Movements are Fluid and Coordinated  --   proprioception intact right great toe     ROM / Strength   AROM / PROM / Strength  Strength      Strength   Overall Strength Comments  Painful lateral right ankle with resisted strength testing. Pt also had some pain in right leg with hip flexion. Painful right shoulder with movements. Pt had some catching movement on right with resistance.    Strength Assessment Site  Shoulder;Elbow;Hand;Hip;Knee;Ankle    Right/Left Shoulder  Right;Left    Right  Shoulder Flexion  2-/5    Left Shoulder Flexion  5/5     Right/Left Elbow  Right;Left    Right Elbow Flexion  4/5    Right Elbow Extension  4/5    Left Elbow Flexion  5/5    Left Elbow Extension  5/5    Right/Left Hip  Right;Left    Right Hip Flexion  3+/5    Left Hip Flexion  5/5    Right/Left Knee  Right;Left    Right Knee Flexion  4/5    Right Knee Extension  4/5    Left Knee Flexion  5/5    Left Knee Extension  5/5    Right/Left Ankle  Right;Left    Right Ankle Dorsiflexion  4/5    Right Ankle Plantar Flexion  4/5    Right Ankle Inversion  4/5    Right Ankle Eversion  4/5    Left Ankle Dorsiflexion  5/5      Transfers   Transfers  Sit to Stand;Stand to Sit    Sit to Stand  5: Supervision    Sit to Stand Details (indicate cue type and reason)  Heavy UE support with some pain in right side . Increased time to rise.    Stand to Sit  5: Supervision    Comments  30 sec sit to stand=2      Ambulation/Gait   Ambulation/Gait  Yes    Ambulation/Gait Assistance  5: Supervision;4: Min guard    Ambulation/Gait Assistance Details  PT had very slow gait with antalgic pattern without AD. Better weight shift with RW.    Ambulation Distance (Feet)  50 Feet    Assistive device  None;Rolling walker    Gait Pattern  Step-to pattern;Step-through pattern;Decreased step length - right;Decreased step length - left;Decreased stance time - right;Antalgic    Ambulation Surface  Level;Indoor    Gait Comments  Unable to complete 10 m walk as patient stopped walking half way through      Standardized Balance Assessment   Standardized Balance Assessment  Timed Up and Go Test      Timed Up and Go Test   TUG  Normal TUG    Normal TUG (seconds)  62   without AD                 Objective measurements completed on examination: See above findings.              PT Education - 03/08/20 1019    Education Details  Pt educated on PT plan of care. Instructed in use of RW at this time to increase safety as is fall risk and will also give  more support to right foot which helped decrease pain/limp. Discussed speaking to PCP at her follow-up about pain she has been experiencing as well as feeling down. Pt reporting she is currently not taking the wellbutryn.    Person(s) Educated  Patient    Methods  Explanation    Comprehension  Verbalized understanding       PT Short Term Goals - 03/08/20 1032      PT SHORT TERM GOAL #1   Title  Pt will be independent with initial HEP for strengthening and balance to continue at home.    Time  4    Period  Weeks    Status  New    Target Date  04/07/20      PT SHORT TERM GOAL #2   Title  Pt will  increase 30 sec sit to stand from 2 reps to 5 or more for improved functional strength.    Baseline  2 reps from standard chair with heavy UE support 03/08/20    Time  4    Period  Weeks    Status  New    Target Date  04/07/20      PT SHORT TERM GOAL #3   Title  Pt will decrease TUG from 62 sec to <40 sec for improved balance and functional mobility.    Baseline  62 sec without AD on 03/08/20    Time  4    Period  Weeks    Target Date  04/07/20      PT SHORT TERM GOAL #4   Title  Pt will ambulate >300' with LRAD mod I on level surfaces for improved household and short community distances.    Baseline  50' CGA    Time  4    Period  Weeks    Status  New    Target Date  04/07/20        PT Long Term Goals - 03/08/20 1035      PT LONG TERM GOAL #1   Title  Pt will be independent with progressive HEP for strengthening, balance and aerobic exercise to continue gains on own.    Time  8    Period  Weeks    Status  New    Target Date  05/07/20      PT LONG TERM GOAL #2   Title  Pt will increase Berg Balance score by 6 points for improved balance and decreased fall risk.    Baseline  TBD    Time  8    Period  Weeks    Status  New    Target Date  05/07/20      PT LONG TERM GOAL #3   Title  Pt will ambulate up/down 4 stairs with reciprocal pattern without railing for improved  functional strength.    Time  8    Period  Weeks    Status  New    Target Date  05/07/20      PT LONG TERM GOAL #4   Title  Pt will ambulate >500' on varied surfaces without AD independently for improved community mobility.    Time  8    Period  Weeks    Status  New    Target Date  05/07/20      PT LONG TERM GOAL #5   Title  Pt will increase gait speed by >0.75m/s for improved gait safety.    Baseline  TBD    Time  8    Period  Weeks    Status  New    Target Date  05/07/20             Plan - 03/08/20 1022    Clinical Impression Statement  Pt is 59 y/o with left thalamic stroke. Pt has right hemiparesis and reporting abnormal sensations in right foot. She is having pain in right foot with pressure like wearing shoes and also reporting shoulder pain. Pt was not using walker upon arrival but ambulating very slow with decreased right stance time. Improved gait quality with use of RW so advised to utilize at this time. Pt is fall risk based on TUG of 62 sec. She has decreased functional strength only completing 2 reps on 30 sec sit to stand. Pt fatigued very quickly with activity. Pt will benefit  from skilled PT to address strength, balance and functional mobility deficits.    Personal Factors and Comorbidities  Comorbidity 3+    Comorbidities  type 2 diabetes, hypertension, hypothyroidism, smoker, stage II chronic kidney disease    Examination-Activity Limitations  Locomotion Level;Stand;Stairs;Transfers    Examination-Participation Restrictions  Community Activity;Yard Work;Shop   work   Public affairs consultant  Moderate    Rehab Potential  Good    PT Frequency  2x / week    PT Duration  8 weeks    PT Treatment/Interventions  ADLs/Self Care Home Management;Cryotherapy;Moist Heat;DME Instruction;Gait training;Stair training;Functional mobility training;Neuromuscular re-education;Balance training;Therapeutic  exercise;Therapeutic activities;Patient/family education;Manual techniques;Passive range of motion;Vestibular    PT Next Visit Plan  Assess hip abductor and extensor strength on right. Assess stairs. Get gait speed and Berg Balance and update goals. Establish initiatl HEP for strengthening right leg.    Consulted and Agree with Plan of Care  Patient       Patient will benefit from skilled therapeutic intervention in order to improve the following deficits and impairments:  Abnormal gait, Decreased activity tolerance, Decreased balance, Decreased endurance, Decreased knowledge of use of DME, Decreased mobility, Decreased range of motion, Decreased strength, Pain, Impaired sensation, Impaired tone, Impaired UE functional use  Visit Diagnosis: Other abnormalities of gait and mobility  Muscle weakness (generalized)  Hemiplegia and hemiparesis following cerebral infarction affecting right dominant side [facility])     Problem List Patient Active Problem List   Diagnosis Date Noted  . CVA (cerebral vascular accident) ([place]) 02/26/2020  . CKD (chronic kidney disease) stage 3, GFR 30-59 ml/min   . Bradycardia   . Elevated TSH   . Multiple joint pain 11/07/2019  . Acute pain of right shoulder 09/19/2019  . Right hip pain 09/19/2019  . Prediabetes 09/08/2018  . Chest pain 01/10/2015  . DM (diabetes mellitus) ([place]) 01/10/2015  . Hypertension 01/10/2015  . Hypothyroidism 01/10/2015  . Headache 01/10/2015  . Abdominal pain 01/10/2015  . Vaginitis 04/07/2014  . Pain 04/07/2014  . Vaginal irritation 04/07/2014  . Boil of buttock 04/06/2014  . BV (bacterial vaginosis) 04/06/2014  . Unspecified symptom associated with female genital organs 11/16/2013  . Candidiasis of vulva and vagina 11/16/2013  . Vaginitis and vulvovaginitis, unspecified 06/07/2013  . Pain aggravated by activities of daily living 06/07/2013  . Nonspecific abnormal finding in stool contents 12/02/2011  . Diabetes mellitus  without mention of complication [id number]  . Chronic back pain     Electa Sniff, PT, DPT, NCS 03/08/2020, 10:40 AM  [facility] [address] [place], [place], [address] Phone: [phone]   Fax:  [phone]  Name: Margaret Webster MRN: [id number] Date of Birth: July 25, 1961

## 2020-03-12 ENCOUNTER — Ambulatory Visit: Payer: Medicare Other

## 2020-03-12 ENCOUNTER — Other Ambulatory Visit: Payer: Self-pay

## 2020-03-12 ENCOUNTER — Telehealth: Payer: Self-pay

## 2020-03-12 VITALS — BP 130/72

## 2020-03-12 DIAGNOSIS — M6281 Muscle weakness (generalized): Secondary | ICD-10-CM | POA: Diagnosis not present

## 2020-03-12 DIAGNOSIS — I69351 Hemiplegia and hemiparesis following cerebral infarction affecting right dominant side: Secondary | ICD-10-CM | POA: Diagnosis not present

## 2020-03-12 DIAGNOSIS — R2689 Other abnormalities of gait and mobility: Secondary | ICD-10-CM | POA: Diagnosis not present

## 2020-03-12 NOTE — Therapy (Signed)
[facility] [address] [place], [place], [address] Phone: [phone]   Fax:  [phone]  Physical Therapy Treatment  Patient Details  Name: Margaret Webster MRN: [id number] Date of Birth: June 03, 1961 Referring Provider (PT): Referred by Barb Merino but will be following with PCP [name]   Encounter Date: 03/12/2020  PT End of Session - 03/12/20 0845    Visit Number  2    Number of Visits  17    Date for PT Re-Evaluation  0000000   90 day cert but 60 day poc   Authorization Type  [employer] medicare so 10th visit progress note, FOTO    PT Start Time  0843    PT Stop Time  0927    PT Time Calculation (min)  44 min    Equipment Utilized During Treatment  --   RW   Activity Tolerance  Patient limited by fatigue    Behavior During Therapy  [facility] for tasks assessed/performed       Past Medical History:  Diagnosis Date  . Arthritis    bil shoulders  . Bronchitis   . Chronic back pain   . Diabetes mellitus    stopped Mrtformin 1 mo ago, made her "sick"  . Hypertension   . Hypothyroidism   . Thyroid disease   . Trigger thumb of right hand     Past Surgical History:  Procedure Laterality Date  . BACK SURGERY  1993  . CARPAL TUNNEL RELEASE  08/2015  . CARPAL TUNNEL RELEASE Left 09/10/2017   Procedure: CARPAL TUNNEL RELEASE;  Surgeon: Leanora Cover, MD;  Location: [facility];  Service: Orthopedics;  Laterality: Left;  . CERVICAL SPINE SURGERY Right 11/2012  . OOPHORECTOMY    . TRIGGER FINGER RELEASE Right 08/06/2017   Procedure: RIGHT THUMB TRIGGER RELEASE;  Surgeon: Leanora Cover, MD;  Location: [facility];  Service: Orthopedics;  Laterality: Right;    Vitals:   03/12/20 0847  BP: 130/72    Subjective Assessment - 03/12/20 0845    Subjective  Pt reports that she had a rough night. Was having tingling in both sides of face. She is using her RW today. Pt reports that she did  see Dr. Laurance Flatten last week but denies any changes.    Pertinent History  type 2 diabetes, hypertension, hypothyroidism, smoker, stage II chronic kidney disease    Patient Stated Goals  Pt wants to be able to walk better. Also working on quitting smoking.    Currently in Pain?  Yes    Pain Location  Leg    Pain Orientation  Right    Pain Descriptors / Indicators  --   weak   Pain Onset  1 to 4 weeks ago    Pain Onset  1 to 4 weeks ago         [facility] PT Assessment - 03/12/20 0849      Strength   Overall Strength Comments  Able to perform bridge with BLE    Right Hip ABduction  3+/5    Left Hip ABduction  4/5                    [facility] Adult PT Treatment/Exercise - 03/12/20 0849      Transfers   Transfers  Sit to Stand;Stand to Sit    Sit to Stand  5: Supervision    Sit to Stand Details  Verbal cues for technique    Stand  to Sit  5: Supervision      Ambulation/Gait   Ambulation/Gait  Yes    Ambulation/Gait Assistance  5: Supervision    Ambulation/Gait Assistance Details  PT raised walker up a notch. Pt was cued to relax shoulders and stay up tall.    Ambulation Distance (Feet)  345 Feet    Assistive device  Rolling walker    Gait Pattern  Step-through pattern;Decreased step length - right;Decreased step length - left    Ambulation Surface  Level;Indoor    Gait velocity  16.84 sec=0.39m/s    Gait Comments  BP=124/70 after gait.       Standardized Balance Assessment   Standardized Balance Assessment  Berg Balance Test      Berg Balance Test   Sit to Stand  Able to stand  independently using hands    Standing Unsupported  Able to stand safely 2 minutes    Sitting with Back Unsupported but Feet Supported on Floor or Stool  Able to sit safely and securely 2 minutes    Stand to Sit  Sits safely with minimal use of hands    Transfers  Able to transfer safely, definite need of hands    Standing Unsupported with Eyes Closed  Able to stand 10 seconds safely    Standing  Ubsupported with Feet Together  Able to place feet together independently and stand 1 minute safely    From Standing, Reach Forward with Outstretched Arm  Can reach confidently >25 cm (10")    From Standing Position, Pick up Object from Floor  Able to pick up shoe safely and easily    From Standing Position, Turn to Look Behind Over each Shoulder  Turn sideways only but maintains balance    Turn 360 Degrees  Able to turn 360 degrees safely but slowly    Standing Unsupported, Alternately Place Feet on Step/Stool  Able to stand independently and safely and complete 8 steps in 20 seconds    Standing Unsupported, One Foot in Front  Able to plae foot ahead of the other independently and hold 30 seconds    Standing on One Leg  Able to lift leg independently and hold equal to or more than 3 seconds   4 sec left, 5 sec right   Total Score  47      Exercises   Exercises  Other Exercises    Other Exercises   Sidelying left for right clamshell x 10 with verbal and tactile cues for form. Bridges x 10.             PT Education - 03/12/20 1211    Education Details  initiated hip strengthening HEP. Discussed walking around home during the day. Instructed in signs/symptoms of stroke and when to seek help    Person(s) Educated  Patient    Methods  Explanation;Demonstration;Handout    Comprehension  Verbalized understanding;Returned demonstration       PT Short Term Goals - 03/08/20 1032      PT SHORT TERM GOAL #1   Title  Pt will be independent with initial HEP for strengthening and balance to continue at home.    Time  4    Period  Weeks    Status  New    Target Date  04/07/20      PT SHORT TERM GOAL #2   Title  Pt will increase 30 sec sit to stand from 2 reps to 5 or more for improved functional strength.  Baseline  2 reps from standard chair with heavy UE support 03/08/20    Time  4    Period  Weeks    Status  New    Target Date  04/07/20      PT SHORT TERM GOAL #3   Title  Pt  will decrease TUG from 62 sec to <40 sec for improved balance and functional mobility.    Baseline  62 sec without AD on 03/08/20    Time  4    Period  Weeks    Target Date  04/07/20      PT SHORT TERM GOAL #4   Title  Pt will ambulate >300' with LRAD mod I on level surfaces for improved household and short community distances.    Baseline  50' CGA    Time  4    Period  Weeks    Status  New    Target Date  04/07/20        PT Long Term Goals - 03/12/20 1212      PT LONG TERM GOAL #1   Title  Pt will be independent with progressive HEP for strengthening, balance and aerobic exercise to continue gains on own.    Time  8    Period  Weeks    Status  New      PT LONG TERM GOAL #2   Title  Pt will increase Berg Balance score by 6 points for improved balance and decreased fall risk.    Baseline  03/12/20 47/56    Time  8    Period  Weeks    Status  New      PT LONG TERM GOAL #3   Title  Pt will ambulate up/down 4 stairs with reciprocal pattern without railing for improved functional strength.    Time  8    Period  Weeks    Status  New      PT LONG TERM GOAL #4   Title  Pt will ambulate >500' on varied surfaces without AD independently for improved community mobility.    Time  8    Period  Weeks    Status  New      PT LONG TERM GOAL #5   Title  Pt will increase gait speed by >0.63m/s for improved gait safety.    Baseline  03/12/20 gait speed=0.66m/s with RW    Time  8    Period  Weeks    Status  New            Plan - 03/12/20 1212    Clinical Impression Statement  Pt showed improved tolerance to activity today increasing gait distance. Her gait speed is safe for household ambulator but not community. Berg score indicates moderate fall risk.    Personal Factors and Comorbidities  Comorbidity 3+    Comorbidities  type 2 diabetes, hypertension, hypothyroidism, smoker, stage II chronic kidney disease    Examination-Activity Limitations  Locomotion  Level;Stand;Stairs;Transfers    Examination-Participation Restrictions  Community Activity;Yard Work;Shop   work   Database administrator  Good    PT Frequency  2x / week    PT Duration  8 weeks    PT Treatment/Interventions  ADLs/Self Care Home Management;Cryotherapy;Moist Heat;DME Instruction;Gait training;Stair training;Functional mobility training;Neuromuscular re-education;Balance training;Therapeutic exercise;Therapeutic activities;Patient/family education;Manual techniques;Passive range of motion;Vestibular    PT Next Visit Plan  Assess stairs.  How is initial HEP going? Continue gait training with  and without walker when able. Standing balance.    Consulted and Agree with Plan of Care  Patient       Patient will benefit from skilled therapeutic intervention in order to improve the following deficits and impairments:  Abnormal gait, Decreased activity tolerance, Decreased balance, Decreased endurance, Decreased knowledge of use of DME, Decreased mobility, Decreased range of motion, Decreased strength, Pain, Impaired sensation, Impaired tone, Impaired UE functional use  Visit Diagnosis: Other abnormalities of gait and mobility  Muscle weakness (generalized)     Problem List Patient Active Problem List   Diagnosis Date Noted  . CVA (cerebral vascular accident) ([place]) 02/26/2020  . CKD (chronic kidney disease) stage 3, GFR 30-59 ml/min   . Bradycardia   . Elevated TSH   . Multiple joint pain 11/07/2019  . Acute pain of right shoulder 09/19/2019  . Right hip pain 09/19/2019  . Prediabetes 09/08/2018  . Chest pain 01/10/2015  . DM (diabetes mellitus) ([facility]) 01/10/2015  . Hypertension 01/10/2015  . Hypothyroidism 01/10/2015  . Headache 01/10/2015  . Abdominal pain 01/10/2015  . Vaginitis 04/07/2014  . Pain 04/07/2014  . Vaginal irritation 04/07/2014  . Boil of buttock 04/06/2014  . BV (bacterial vaginosis)  04/06/2014  . Unspecified symptom associated with female genital organs 11/16/2013  . Candidiasis of vulva and vagina 11/16/2013  . Vaginitis and vulvovaginitis, unspecified 06/07/2013  . Pain aggravated by activities of daily living 06/07/2013  . Nonspecific abnormal finding in stool contents 12/02/2011  . Diabetes mellitus without mention of complication [id number]  . Chronic back pain     Electa Sniff, PT, DPT, NCS 03/12/2020, 12:15 PM  [facility] [address] [place], [place], [address] Phone: [phone]   Fax:  [phone]  Name: Margaret Webster MRN: [id number] Date of Birth: 1961-02-03

## 2020-03-12 NOTE — Patient Instructions (Signed)
Access Code: [email or web address] URL: [email or web address] Date: 03/12/2020 Prepared by: Cherly Anderson  Exercises Supine Bridge - 1 x daily - 5 x weekly - 2 sets - 10 reps Clamshell - 1 x daily - 5 x weekly - 2 sets - 10 reps

## 2020-03-12 NOTE — Telephone Encounter (Signed)
Dr. Laurance Flatten, Margaret Webster was evaluated by PT on 03/08/20.  The patient would benefit from OT evaluation for right shoulder weakness following CVA.   If you agree, please place an order in [facility] workque in [facility] or fax the order to [phone]. Thank you, Cherly Anderson, PT, DPT, [place] [address] [place], [place]  [address] Phone:  [phone] Fax:  [phone]  2

## 2020-03-12 NOTE — Telephone Encounter (Signed)
Hi,  We will fax an order.   Kindest Regards,   [name], DNP, FNP-BC

## 2020-03-12 NOTE — Telephone Encounter (Signed)
Thank you :)

## 2020-03-14 ENCOUNTER — Ambulatory Visit: Payer: Self-pay

## 2020-03-14 DIAGNOSIS — I6389 Other cerebral infarction: Secondary | ICD-10-CM

## 2020-03-14 NOTE — Chronic Care Management (AMB) (Signed)
  Chronic Care Management   Outreach Note  03/14/2020 Name: Margaret Webster MRN: [id number] DOB: 1961/09/10  Referred by: [name], FNP Reason for referral : Care Coordination   SW placed an unsuccessful outbound call to the patient to assess for care management and care coordination needs. Unfortunately, the patients voice mailbox is full which prohibited SW from leaving a voice message requesting a return call.  Follow Up Plan: The care management team will reach out to the patient again over the next 14 days.   Daneen Schick, BSW, CDP Social Worker, Certified Dementia Practitioner [place] [facility] [phone]

## 2020-03-15 ENCOUNTER — Other Ambulatory Visit: Payer: Self-pay

## 2020-03-15 ENCOUNTER — Telehealth: Payer: Self-pay

## 2020-03-15 ENCOUNTER — Ambulatory Visit: Payer: Medicare Other

## 2020-03-15 DIAGNOSIS — R2689 Other abnormalities of gait and mobility: Secondary | ICD-10-CM

## 2020-03-15 DIAGNOSIS — M6281 Muscle weakness (generalized): Secondary | ICD-10-CM

## 2020-03-15 DIAGNOSIS — I69351 Hemiplegia and hemiparesis following cerebral infarction affecting right dominant side: Secondary | ICD-10-CM | POA: Diagnosis not present

## 2020-03-15 DIAGNOSIS — S92504A Nondisplaced unspecified fracture of right lesser toe(s), initial encounter for closed fracture: Secondary | ICD-10-CM

## 2020-03-15 NOTE — Telephone Encounter (Signed)
[name], I noted on x-ray of Ms. Maffei's right foot that she had a nondisplaced right proximal phalanx fracture. She had asked me to check her foot in therapy today as she was continuing to have pain and had not heard back about results. I have her on walker right not so she can take some weight off as needed due to pain. Does she have any restrictions with this fracture or need any special footwear? I wanted to check before I tried to progress her off walker or increase her walking further. Thanks for your assistance. Cherly Anderson, PT, DPT, NCS

## 2020-03-15 NOTE — Patient Instructions (Signed)
Access Code: [email or web address] URL: [email or web address] Date: 03/12/2020 Prepared by: Cherly Anderson  Exercises Supine Bridge - 1 x daily - 5 x weekly - 2 sets - 10 reps Clamshell - 1 x daily - 5 x weekly - 2 sets - 10 reps Standing Hip Abduction with Counter Support - 1 x daily - 5 x weekly - 2 sets - 10 reps Standing Hip Extension with Counter Support - 1 x daily - 5 x weekly - 2 sets - 10 reps Mini Squat with Counter Support - 1 x daily - 5 x weekly - 2 sets - 10 reps

## 2020-03-15 NOTE — Telephone Encounter (Signed)
Thanks so much [place]! I'll let her know that next time I see her.

## 2020-03-15 NOTE — Therapy (Signed)
[facility] [address] [place], [place], [address] Phone: [phone]   Fax:  [phone]  Physical Therapy Treatment  Patient Details  Name: Margaret Webster MRN: [id number] Date of Birth: 1961/01/05 Referring Provider (PT): Referred by Barb Merino but will be following with PCP [name]   Encounter Date: 03/15/2020  PT End of Session - 03/15/20 0805    Visit Number  3    Number of Visits  17    Date for PT Re-Evaluation  0000000   90 day cert but 60 day poc   Authorization Type  [employer] medicare so 10th visit progress note, FOTO    PT Start Time  0800    PT Stop Time  0840    PT Time Calculation (min)  40 min    Equipment Utilized During Treatment  --   RW   Activity Tolerance  Patient limited by fatigue    Behavior During Therapy  [facility] for tasks assessed/performed       Past Medical History:  Diagnosis Date  . Arthritis    bil shoulders  . Bronchitis   . Chronic back pain   . Diabetes mellitus    stopped Mrtformin 1 mo ago, made her "sick"  . Hypertension   . Hypothyroidism   . Thyroid disease   . Trigger thumb of right hand     Past Surgical History:  Procedure Laterality Date  . BACK SURGERY  1993  . CARPAL TUNNEL RELEASE  08/2015  . CARPAL TUNNEL RELEASE Left 09/10/2017   Procedure: CARPAL TUNNEL RELEASE;  Surgeon: Leanora Cover, MD;  Location: [facility];  Service: Orthopedics;  Laterality: Left;  . CERVICAL SPINE SURGERY Right 11/2012  . OOPHORECTOMY    . TRIGGER FINGER RELEASE Right 08/06/2017   Procedure: RIGHT THUMB TRIGGER RELEASE;  Surgeon: Leanora Cover, MD;  Location: [facility];  Service: Orthopedics;  Laterality: Right;    There were no vitals filed for this visit.  Subjective Assessment - 03/15/20 0802    Subjective  Pt reports that she has neurologist visit right after therapy today. Right pinky toe is still very painful. PT pulled up x-ray  results and found that pt has nondisplaced fracture of right proximal phalanx.    Pertinent History  type 2 diabetes, hypertension, hypothyroidism, smoker, stage II chronic kidney disease    Patient Stated Goals  Pt wants to be able to walk better. Also working on quitting smoking.    Currently in Pain?  Yes    Pain Score  0-No pain    Pain Location  Foot    Pain Orientation  Right    Pain Descriptors / Indicators  Aching;Throbbing    Pain Type  Acute pain    Pain Onset  1 to 4 weeks ago    Pain Frequency  Constant    Pain Onset  1 to 4 weeks ago                        [facility] Adult PT Treatment/Exercise - 03/15/20 [id number]      Ambulation/Gait   Ambulation/Gait  Yes    Ambulation/Gait Assistance  5: Supervision    Ambulation/Gait Assistance Details  Pt was cued to take some weight through hands with right stance to help decrease pain in right pinky toe as needed.    Ambulation Distance (Feet)  345 Feet    Assistive device  Rolling walker  Gait Pattern  Step-through pattern;Antalgic   right   Ambulation Surface  Level;Indoor    Stairs  Yes    Stairs Assistance  5: Supervision    Stairs Assistance Details (indicate cue type and reason)  Pt was instructed in proper technique. Noted right hip circumduction first time so instructed to bring straight up with hip flexion 2nd bout and much improved    Stair Management Technique  Two rails;Step to pattern    Number of Stairs  8    Height of Stairs  6      Exercises   Exercises  Other Exercises    Other Exercises   In // bars: standing right hip abduction x 10, standing hip extension x 10, mini-squats x 10. Pt was given verbal cues for form with exercises with brief standing rest breaks in between. Sidestepping 6' x 6.             PT Education - 03/15/20 0846    Education Details  Added to HEP standing right hip strengthening.    Person(s) Educated  Patient    Methods  Explanation;Demonstration;Handout     Comprehension  Verbalized understanding;Returned demonstration       PT Short Term Goals - 03/08/20 1032      PT SHORT TERM GOAL #1   Title  Pt will be independent with initial HEP for strengthening and balance to continue at home.    Time  4    Period  Weeks    Status  New    Target Date  04/07/20      PT SHORT TERM GOAL #2   Title  Pt will increase 30 sec sit to stand from 2 reps to 5 or more for improved functional strength.    Baseline  2 reps from standard chair with heavy UE support 03/08/20    Time  4    Period  Weeks    Status  New    Target Date  04/07/20      PT SHORT TERM GOAL #3   Title  Pt will decrease TUG from 62 sec to <40 sec for improved balance and functional mobility.    Baseline  62 sec without AD on 03/08/20    Time  4    Period  Weeks    Target Date  04/07/20      PT SHORT TERM GOAL #4   Title  Pt will ambulate >300' with LRAD mod I on level surfaces for improved household and short community distances.    Baseline  50' CGA    Time  4    Period  Weeks    Status  New    Target Date  04/07/20        PT Long Term Goals - 03/12/20 1212      PT LONG TERM GOAL #1   Title  Pt will be independent with progressive HEP for strengthening, balance and aerobic exercise to continue gains on own.    Time  8    Period  Weeks    Status  New      PT LONG TERM GOAL #2   Title  Pt will increase Berg Balance score by 6 points for improved balance and decreased fall risk.    Baseline  03/12/20 47/56    Time  8    Period  Weeks    Status  New      PT LONG TERM GOAL #3   Title  Pt will  ambulate up/down 4 stairs with reciprocal pattern without railing for improved functional strength.    Time  8    Period  Weeks    Status  New      PT LONG TERM GOAL #4   Title  Pt will ambulate >500' on varied surfaces without AD independently for improved community mobility.    Time  8    Period  Weeks    Status  New      PT LONG TERM GOAL #5   Title  Pt will increase  gait speed by >0.72m/s for improved gait safety.    Baseline  03/12/20 gait speed=0.80m/s with RW    Time  8    Period  Weeks    Status  New            Plan - 03/15/20 0847    Clinical Impression Statement  Pt continues to progress with more standing activities and less breaks. PT did limit excessive weight on right foot as discovered that patient does have right nondisplaced proximal phalanx fracture. Will reach out to MD about any restrictions but encouraged patient to continue to use walker so she can take some pressure off as needed.    Personal Factors and Comorbidities  Comorbidity 3+    Comorbidities  type 2 diabetes, hypertension, hypothyroidism, smoker, stage II chronic kidney disease    Examination-Activity Limitations  Locomotion Level;Stand;Stairs;Transfers    Examination-Participation Restrictions  Community Activity;Yard Work;Shop   work   Database administrator  Good    PT Frequency  2x / week    PT Duration  8 weeks    PT Treatment/Interventions  ADLs/Self Care Home Management;Cryotherapy;Moist Heat;DME Instruction;Gait training;Stair training;Functional mobility training;Neuromuscular re-education;Balance training;Therapeutic exercise;Therapeutic activities;Patient/family education;Manual techniques;Passive range of motion;Vestibular    PT Next Visit Plan  Continue gait training with and without walker when able. Any updates on right proximal phalanx fracture? How did neurologist visit go? Standing balance.    Consulted and Agree with Plan of Care  Patient       Patient will benefit from skilled therapeutic intervention in order to improve the following deficits and impairments:  Abnormal gait, Decreased activity tolerance, Decreased balance, Decreased endurance, Decreased knowledge of use of DME, Decreased mobility, Decreased range of motion, Decreased strength, Pain, Impaired sensation, Impaired tone, Impaired  UE functional use  Visit Diagnosis: Other abnormalities of gait and mobility  Muscle weakness (generalized)     Problem List Patient Active Problem List   Diagnosis Date Noted  . CVA (cerebral vascular accident) ([facility]) 02/26/2020  . CKD (chronic kidney disease) stage 3, GFR 30-59 ml/min   . Bradycardia   . Elevated TSH   . Multiple joint pain 11/07/2019  . Acute pain of right shoulder 09/19/2019  . Right hip pain 09/19/2019  . Prediabetes 09/08/2018  . Chest pain 01/10/2015  . DM (diabetes mellitus) ([facility]) 01/10/2015  . Hypertension 01/10/2015  . Hypothyroidism 01/10/2015  . Headache 01/10/2015  . Abdominal pain 01/10/2015  . Vaginitis 04/07/2014  . Pain 04/07/2014  . Vaginal irritation 04/07/2014  . Boil of buttock 04/06/2014  . BV (bacterial vaginosis) 04/06/2014  . Unspecified symptom associated with female genital organs 11/16/2013  . Candidiasis of vulva and vagina 11/16/2013  . Vaginitis and vulvovaginitis, unspecified 06/07/2013  . Pain aggravated by activities of daily living 06/07/2013  . Nonspecific abnormal finding in stool contents 12/02/2011  . Diabetes mellitus without mention of complication [id number]  .  Chronic back pain     Electa Sniff, PT, DPT, NCS 03/15/2020, 8:49 AM  [facility] [address] [place], [place], [address] Phone: [phone]   Fax:  [phone]  Name: Priscille Coiner MRN: [id number] Date of Birth: 1961-02-26

## 2020-03-15 NOTE — Telephone Encounter (Signed)
Margaret Webster, I called [name] when I got the results back but could not leave a message due to her voicemail being full. I also sent her a mychart message indicating this and have not received a response. I advised her to wear good support shoes and to buddy tape her toes, I will make a referral to the podiatrist for further evaluation. I have called her again but unable to leave a voicemail.   Kindest Regards,   [name], DNP, FNP-BC

## 2020-03-18 ENCOUNTER — Emergency Department (HOSPITAL_COMMUNITY)
Admission: EM | Admit: 2020-03-18 | Discharge: 2020-03-18 | Disposition: A | Discharge status: Home / Self Care | Payer: Medicare Other | Attending: Emergency Medicine | Admitting: Emergency Medicine

## 2020-03-18 ENCOUNTER — Encounter (HOSPITAL_COMMUNITY): Payer: Self-pay | Admitting: Emergency Medicine

## 2020-03-18 ENCOUNTER — Other Ambulatory Visit: Payer: Self-pay

## 2020-03-18 DIAGNOSIS — S92514A Nondisplaced fracture of proximal phalanx of right lesser toe(s), initial encounter for closed fracture: Secondary | ICD-10-CM

## 2020-03-18 DIAGNOSIS — I10 Essential (primary) hypertension: Secondary | ICD-10-CM | POA: Diagnosis not present

## 2020-03-18 DIAGNOSIS — E039 Hypothyroidism, unspecified: Secondary | ICD-10-CM | POA: Diagnosis not present

## 2020-03-18 DIAGNOSIS — Z7901 Long term (current) use of anticoagulants: Secondary | ICD-10-CM | POA: Insufficient documentation

## 2020-03-18 DIAGNOSIS — S62646A Nondisplaced fracture of proximal phalanx of right little finger, initial encounter for closed fracture: Secondary | ICD-10-CM | POA: Diagnosis not present

## 2020-03-18 DIAGNOSIS — W228XXA Striking against or struck by other objects, initial encounter: Secondary | ICD-10-CM | POA: Insufficient documentation

## 2020-03-18 DIAGNOSIS — Y999 Unspecified external cause status: Secondary | ICD-10-CM | POA: Diagnosis not present

## 2020-03-18 DIAGNOSIS — F17210 Nicotine dependence, cigarettes, uncomplicated: Secondary | ICD-10-CM | POA: Diagnosis not present

## 2020-03-18 DIAGNOSIS — Y939 Activity, unspecified: Secondary | ICD-10-CM | POA: Diagnosis not present

## 2020-03-18 DIAGNOSIS — Y929 Unspecified place or not applicable: Secondary | ICD-10-CM | POA: Insufficient documentation

## 2020-03-18 DIAGNOSIS — S99921A Unspecified injury of right foot, initial encounter: Secondary | ICD-10-CM | POA: Diagnosis present

## 2020-03-18 HISTORY — DX: Cerebral infarction, unspecified: I63.9

## 2020-03-18 NOTE — ED Notes (Signed)
Patient verbalizes understanding of discharge instructions . Opportunity for questions and answers were provided . Armband removed by staff ,Pt discharged from ED. W/C  offered at D/C  and Declined W/C at D/C and was escorted to lobby by RN.  

## 2020-03-18 NOTE — ED Triage Notes (Signed)
C/o R little toe pain since hitting it on a chair 3 weeks ago.  States her PCP ordered x-rays at [facility] and it is fractured per her physical therapist.  States she was going to follow-up with PCP this week but couldn't wait due to pain.

## 2020-03-18 NOTE — Discharge Instructions (Addendum)
Buddy tape your toes as demonstrated today in the emergency room.  Wear the postop shoe.  Contact your PCP, office has been trying to get in touch with you and will refer to podiatry.  Also recommend contacting my chart to help get access to your account.

## 2020-03-18 NOTE — ED Provider Notes (Signed)
[facility] EMERGENCY DEPARTMENT Provider Note   CSN: [id number] Arrival date & time: 03/18/20  [id number]     History Chief Complaint  Patient presents with  . Toe Pain    Margaret Webster is a 59 y.o. female.  59 year old female presents for evaluation of right great toe pain.  Patient states that she injured her toe about 3 weeks ago when she had a stroke and accidentally kicked her sulfa, due to the numbness in her leg did not realize she had injured her toe.  Patient followed up with her PCP who did an x-ray and patient was told that she broke her toe.  Patient has been trying to wear her usual shoes however this is very painful.  No other injuries, complaints or concerns today.        Past Medical History:  Diagnosis Date  . Arthritis    bil shoulders  . Bronchitis   . Chronic back pain   . Diabetes mellitus    stopped Mrtformin 1 mo ago, made her "sick"  . Hypertension   . Hypothyroidism   . Stroke ([facility])   . Thyroid disease   . Trigger thumb of right hand     Patient Active Problem List   Diagnosis Date Noted  . CVA (cerebral vascular accident) ([facility]) 02/26/2020  . CKD (chronic kidney disease) stage 3, GFR 30-59 ml/min   . Bradycardia   . Elevated TSH   . Multiple joint pain 11/07/2019  . Acute pain of right shoulder 09/19/2019  . Right hip pain 09/19/2019  . Prediabetes 09/08/2018  . Chest pain 01/10/2015  . DM (diabetes mellitus) ([facility]) 01/10/2015  . Hypertension 01/10/2015  . Hypothyroidism 01/10/2015  . Headache 01/10/2015  . Abdominal pain 01/10/2015  . Vaginitis 04/07/2014  . Pain 04/07/2014  . Vaginal irritation 04/07/2014  . Boil of buttock 04/06/2014  . BV (bacterial vaginosis) 04/06/2014  . Unspecified symptom associated with female genital organs 11/16/2013  . Candidiasis of vulva and vagina 11/16/2013  . Vaginitis and vulvovaginitis, unspecified 06/07/2013  . Pain aggravated by activities of daily living 06/07/2013  .  Nonspecific abnormal finding in stool contents 12/02/2011  . Diabetes mellitus without mention of complication [id number]  . Chronic back pain     Past Surgical History:  Procedure Laterality Date  . BACK SURGERY  1993  . CARPAL TUNNEL RELEASE  08/2015  . CARPAL TUNNEL RELEASE Left 09/10/2017   Procedure: CARPAL TUNNEL RELEASE;  Surgeon: Leanora Cover, MD;  Location: [facility];  Service: Orthopedics;  Laterality: Left;  . CERVICAL SPINE SURGERY Right 11/2012  . OOPHORECTOMY    . TRIGGER FINGER RELEASE Right 08/06/2017   Procedure: RIGHT THUMB TRIGGER RELEASE;  Surgeon: Leanora Cover, MD;  Location: [facility];  Service: Orthopedics;  Laterality: Right;     OB History    Gravida  1   Para  0   Term  0   Preterm  0   AB  0   Living  1     SAB  0   TAB  0   Ectopic  0   Multiple  0   Live Births  1           Family History  Problem Relation Age of Onset  . Diabetes Mother   . Cystic fibrosis Sister   . Cancer Sister        ?  Marland Kitchen Heart disease Maternal Grandmother   . Clotting disorder  Maternal Grandfather     Social History   Tobacco Use  . Smoking status: Current Every Day Smoker    Packs/day: 0.25    Types: Cigarettes  . Smokeless tobacco: Never Used  . Tobacco comment: 4 cig a day, not interested in chantix  Substance Use Topics  . Alcohol use: Yes    Comment: occas  . Drug use: Yes    Types: Marijuana    Home Medications Prior to Admission medications   Medication Sig Start Date End Date Taking? Authorizing Provider  acetaminophen (TYLENOL) 500 MG tablet Take 1,000 mg by mouth every 6 (six) hours as needed for mild pain.    [provider]  amLODipine (NORVASC) 2.5 MG tablet Take 1 tablet (2.5 mg total) by mouth daily. 02/28/20 02/27/21  Barb Merino, MD  aspirin EC 81 MG tablet Take 81 mg by mouth once.    [provider]  buPROPion (WELLBUTRIN XL) 150 MG 24 hr tablet Take 1 tablet (150 mg  total) by mouth every morning. Patient not taking: Reported on 03/08/2020 03/06/20 03/06/21  [name], FNP  clopidogrel (PLAVIX) 75 MG tablet Take 1 tablet (75 mg total) by mouth daily for 21 days. 02/28/20 03/20/20  Barb Merino, MD  diclofenac Sodium (VOLTAREN) 1 % GEL Apply 2 g topically 4 (four) times daily. 01/25/20   [name], FNP  lidocaine (LIDODERM) 5 % Place 1 patch onto the skin daily. Remove & Discard patch within 12 hours or as directed by MD 06/08/19   Quintella Reichert, MD  metFORMIN (GLUCOPHAGE) 500 MG tablet Take 1 tablet (500 mg total) by mouth 2 (two) times daily with a meal. 02/28/20 05/28/20  Barb Merino, MD  mometasone (NASONEX) 50 MCG/ACT nasal spray Place 2 sprays into the nose daily. 01/25/20 01/24/21  [name], FNP  polyvinyl alcohol (LIQUIFILM TEARS) 1.4 % ophthalmic solution Place 1 drop into both eyes as needed for dry eyes.    [provider]  rosuvastatin (CRESTOR) 40 MG tablet Take 1 tablet (40 mg total) by mouth daily. 02/28/20 05/28/20  Barb Merino, MD  valACYclovir (VALTREX) 500 MG tablet Take 500 mg by mouth daily. Started on 01-28-20 DS 30 01/28/20   [provider]    Allergies    Sulfa antibiotics, Ibuprofen, Polytrim [polymyxin b-trimethoprim], Influenza vaccines, Lipitor [atorvastatin calcium], and Naproxen  Review of Systems   Review of Systems  Constitutional: Negative for fever.  Musculoskeletal: Positive for arthralgias and gait problem.  Skin: Negative for rash and wound.  Neurological: Negative for weakness and numbness.    Physical Exam Updated Vital Signs BP (!) 144/72 (BP Location: Left Arm)   Pulse 66   Temp 98.3 F (36.8 C) (Oral)   Resp 16   Ht 5\' 7"  (1.702 m)   Wt 100.4 kg   SpO2 100%   BMI 34.68 kg/m   Physical Exam Vitals and nursing note reviewed.  Constitutional:      General: She is not in acute distress.    Appearance: She is well-developed. She is not diaphoretic.  HENT:     Head: Normocephalic  and atraumatic.  Pulmonary:     Effort: Pulmonary effort is normal.  Musculoskeletal:        General: Swelling and tenderness present. No deformity.       Legs:  Skin:    General: Skin is warm and dry.     Findings: No bruising or erythema.  Neurological:     Mental Status: She is alert and  oriented to person, place, and time.  Psychiatric:        Behavior: Behavior normal.     ED Results / Procedures / Treatments   Labs (all labs ordered are listed, but only abnormal results are displayed) Labs Reviewed - No data to display  EKG None  Radiology No results found.  Procedures Procedures (including critical care time)  Medications Ordered in ED Medications - No data to display  ED Course  I have reviewed the triage vital signs and the nursing notes.  Pertinent labs & imaging results that were available during my care of the patient were reviewed by me and considered in my medical decision making (see chart for details).  Clinical Course as of Mar 18 1002  Sun Mar 19, 3327  [id number] 59 year old female with right great toe pain x3 weeks.  X-rays reviewed showing nondisplaced fracture of the right fifth toe.  PCP is aware of this finding however has been unable to get in touch with the patient to recommend buddy taping and postop shoe.  PCP plans to refer to podiatry.  Reviewed chart notes with patient, informed patient her mailbox is full on her phone, we looked at her phone today and she does not have any saved voicemails, she will follow up with her phone provider regarding her full voicemail box.  Patient is also unable to get her MyChart messages as she is not able to access her my chart at this time.  Patient was given the phone number to call my chart for help with this as well as phone number for chronic care management who has also been trying to get in touch with her.  Message sent to chronic care management informing manager of same.  Toe is buddy taped today, placed in  postop shoe with plan to follow-up with PCP for podiatry referral.   [LM]    Clinical Course User Index [LM] Roque Lias   MDM Rules/Calculators/A&P                      Final Clinical Impression(s) / ED Diagnoses Final diagnoses:  Closed nondisplaced fracture of proximal phalanx of lesser toe of right foot, initial encounter    Rx / DC Orders ED Discharge Orders    None       Tacy Learn, PA-C 03/18/20 1003    Sherwood Gambler, MD 03/21/20 [phone]

## 2020-03-19 ENCOUNTER — Ambulatory Visit: Payer: Medicare Other

## 2020-03-19 DIAGNOSIS — M6281 Muscle weakness (generalized): Secondary | ICD-10-CM | POA: Diagnosis not present

## 2020-03-19 DIAGNOSIS — R2689 Other abnormalities of gait and mobility: Secondary | ICD-10-CM

## 2020-03-19 DIAGNOSIS — I69351 Hemiplegia and hemiparesis following cerebral infarction affecting right dominant side: Secondary | ICD-10-CM | POA: Diagnosis not present

## 2020-03-19 NOTE — Therapy (Signed)
[facility] [address] [place], [place], [address] Phone: [phone]   Fax:  [phone]  Physical Therapy Treatment  Patient Details  Name: Margaret Webster MRN: [id number] Date of Birth: [birth year] Referring Provider (PT): Referred by Barb Merino but will be following with PCP [name]   Encounter Date: 03/19/2020  PT End of Session - 03/19/20 0805    Visit Number  4    Number of Visits  17    Date for PT Re-Evaluation  0000000   90 day cert but 60 day poc   Authorization Type  [employer] medicare so 10th visit progress note, FOTO    PT Start Time  0800    PT Stop Time  0842    PT Time Calculation (min)  42 min    Equipment Utilized During Treatment  Gait belt   RW, SPC   Activity Tolerance  Patient tolerated treatment well    Behavior During Therapy  [facility] for tasks assessed/performed       Past Medical History:  Diagnosis Date  . Arthritis    bil shoulders  . Bronchitis   . Chronic back pain   . Diabetes mellitus    stopped Mrtformin 1 mo ago, made her "sick"  . Hypertension   . Hypothyroidism   . Stroke ([place])   . Thyroid disease   . Trigger thumb of right hand     Past Surgical History:  Procedure Laterality Date  . BACK SURGERY  1993  . CARPAL TUNNEL RELEASE  08/2015  . CARPAL TUNNEL RELEASE Left 09/10/2017   Procedure: CARPAL TUNNEL RELEASE;  Surgeon: Leanora Cover, MD;  Location: [facility];  Service: Orthopedics;  Laterality: Left;  . CERVICAL SPINE SURGERY Right 11/2012  . OOPHORECTOMY    . TRIGGER FINGER RELEASE Right 08/06/2017   Procedure: RIGHT THUMB TRIGGER RELEASE;  Surgeon: Leanora Cover, MD;  Location: [facility];  Service: Orthopedics;  Laterality: Right;    There were no vitals filed for this visit.  Subjective Assessment - 03/19/20 0803    Subjective  Pt reports that she was having so much pain in right toe by Saturday night that Sunday she went  to ER to get it checked. Found out there that PCP had been trying to reach her and that they wanted her to buddy tape the 5th toe and then they placed her in post-op shoe. She reports that that is helping.    Pertinent History  type 2 diabetes, hypertension, hypothyroidism, smoker, stage II chronic kidney disease    Patient Stated Goals  Pt wants to be able to walk better. Also working on quitting smoking.    Currently in Pain?  Yes    Pain Score  5     Pain Location  Foot    Pain Orientation  Right    Pain Descriptors / Indicators  Aching    Pain Type  Acute pain    Pain Onset  1 to 4 weeks ago    Pain Onset  1 to 4 weeks ago                        [facility] Adult PT Treatment/Exercise - 03/19/20 0805      Ambulation/Gait   Ambulation/Gait  Yes    Ambulation/Gait Assistance  5: Supervision;6: Modified independent (Device/Increase time)    Ambulation/Gait Assistance Details  Pt reported less pain in right foot with post-op shoe.  Pt had decreased step length but less antalgic gait. At end of session pt ambulated with [facility] CGA with verbal cues for sequencing. Pt able to increase cadence during second lap.    Ambulation Distance (Feet)  460 Feet   230' x 1 with SPC at end   Assistive device  Rolling walker   right post-op shoe   Gait Pattern  Step-through pattern    Ambulation Surface  Level;Indoor      Neuro Re-ed    Neuro Re-ed Details   In // bars: standing with feet together x 30 sec eyes open and x 30 sec eyes closed, staggered stance x 30 sec each position. Side stepping without UE support 6' x 4. Standing on airex with feet together x 30 sec eyes open then x 10 with head turns up/down and repeated with head turns side to side. Marching in place on airex with1 UE support x 10. CGA with activities. Pt had increased sway with head turns on compliant surface.             PT Education - 03/19/20 0858    Education Details  Pt to continue with current HEP. Discussed  continuing to elevate right foot when sitting. Pt going to try to get [facility]    Person(s) Educated  Patient    Methods  Explanation    Comprehension  Verbalized understanding       PT Short Term Goals - 03/08/20 1032      PT SHORT TERM GOAL #1   Title  Pt will be independent with initial HEP for strengthening and balance to continue at home.    Time  4    Period  Weeks    Status  New    Target Date  04/07/20      PT SHORT TERM GOAL #2   Title  Pt will increase 30 sec sit to stand from 2 reps to 5 or more for improved functional strength.    Baseline  2 reps from standard chair with heavy UE support 03/08/20    Time  4    Period  Weeks    Status  New    Target Date  04/07/20      PT SHORT TERM GOAL #3   Title  Pt will decrease TUG from 62 sec to <40 sec for improved balance and functional mobility.    Baseline  62 sec without AD on 03/08/20    Time  4    Period  Weeks    Target Date  04/07/20      PT SHORT TERM GOAL #4   Title  Pt will ambulate >300' with LRAD mod I on level surfaces for improved household and short community distances.    Baseline  50' CGA    Time  4    Period  Weeks    Status  New    Target Date  04/07/20        PT Long Term Goals - 03/12/20 1212      PT LONG TERM GOAL #1   Title  Pt will be independent with progressive HEP for strengthening, balance and aerobic exercise to continue gains on own.    Time  8    Period  Weeks    Status  New      PT LONG TERM GOAL #2   Title  Pt will increase Berg Balance score by 6 points for improved balance and decreased fall risk.    Baseline  03/12/20 47/56    Time  8    Period  Weeks    Status  New      PT LONG TERM GOAL #3   Title  Pt will ambulate up/down 4 stairs with reciprocal pattern without railing for improved functional strength.    Time  8    Period  Weeks    Status  New      PT LONG TERM GOAL #4   Title  Pt will ambulate >500' on varied surfaces without AD independently for improved community  mobility.    Time  8    Period  Weeks    Status  New      PT LONG TERM GOAL #5   Title  Pt will increase gait speed by >0.64m/s for improved gait safety.    Baseline  03/12/20 gait speed=0.56m/s with RW    Time  8    Period  Weeks    Status  New            Plan - 03/19/20 0900    Clinical Impression Statement  Pt was able to progress gait distance today with RW and initiated gait training with SPC. Pt had less pain with use of post-op shoe. Pt showing improving stability with balance activities.    Personal Factors and Comorbidities  Comorbidity 3+    Comorbidities  type 2 diabetes, hypertension, hypothyroidism, smoker, stage II chronic kidney disease    Examination-Activity Limitations  Locomotion Level;Stand;Stairs;Transfers    Examination-Participation Restrictions  Community Activity;Yard Work;Shop   work   Database administrator  Good    PT Frequency  2x / week    PT Duration  8 weeks    PT Treatment/Interventions  ADLs/Self Care Home Management;Cryotherapy;Moist Heat;DME Instruction;Gait training;Stair training;Functional mobility training;Neuromuscular re-education;Balance training;Therapeutic exercise;Therapeutic activities;Patient/family education;Manual techniques;Passive range of motion;Vestibular    PT Next Visit Plan  Gait trianing with SPC. Standing balance progression, right leg strengthening.    Consulted and Agree with Plan of Care  Patient       Patient will benefit from skilled therapeutic intervention in order to improve the following deficits and impairments:  Abnormal gait, Decreased activity tolerance, Decreased balance, Decreased endurance, Decreased knowledge of use of DME, Decreased mobility, Decreased range of motion, Decreased strength, Pain, Impaired sensation, Impaired tone, Impaired UE functional use  Visit Diagnosis: Other abnormalities of gait and mobility  Muscle weakness  (generalized)     Problem List Patient Active Problem List   Diagnosis Date Noted  . CVA (cerebral vascular accident) ([place]) 02/26/2020  . CKD (chronic kidney disease) stage 3, GFR 30-59 ml/min   . Bradycardia   . Elevated TSH   . Multiple joint pain 11/07/2019  . Acute pain of right shoulder 09/19/2019  . Right hip pain 09/19/2019  . Prediabetes 09/08/2018  . Chest pain 01/10/2015  . DM (diabetes mellitus) ([place]) 01/10/2015  . Hypertension 01/10/2015  . Hypothyroidism 01/10/2015  . Headache 01/10/2015  . Abdominal pain 01/10/2015  . Vaginitis 04/07/2014  . Pain 04/07/2014  . Vaginal irritation 04/07/2014  . Boil of buttock 04/06/2014  . BV (bacterial vaginosis) 04/06/2014  . Unspecified symptom associated with female genital organs 11/16/2013  . Candidiasis of vulva and vagina 11/16/2013  . Vaginitis and vulvovaginitis, unspecified 06/07/2013  . Pain aggravated by activities of daily living 06/07/2013  . Nonspecific abnormal finding in stool contents 12/02/2011  . Diabetes mellitus without mention of complication [id number]  .  Chronic back pain     Electa Sniff, PT, DPT, NCS 03/19/2020, 9:01 AM  [facility] [address] [place], [place], [address] Phone: [phone]   Fax:  [phone]  Name: Shauntel Keefner MRN: [id number] Date of Birth: April 10, 1961

## 2020-03-20 ENCOUNTER — Ambulatory Visit: Payer: Self-pay

## 2020-03-20 DIAGNOSIS — I10 Essential (primary) hypertension: Secondary | ICD-10-CM

## 2020-03-20 DIAGNOSIS — I6389 Other cerebral infarction: Secondary | ICD-10-CM

## 2020-03-20 NOTE — Chronic Care Management (AMB) (Signed)
  Chronic Care Management   Outreach Note  03/20/2020 Name: Margaret Webster MRN: [id number] DOB: 02-Aug-1961  Referred by: [name], FNP Reason for referral : Care Coordination   SW placed a successful outbound call to the patient to follow up on care management and care coordination needs post hospital admission. The patient reports she is currently in a store trying to locate a blood pressure monitor as well as a cane. SW reminded the patient of her over the counter benefit under her Medicare Health Plan. Attempted to assist the patient in calling her health plan to obtain supplies. The patient reports she is unable to complete the call at this time but is confident she will be successful on her own when returning home. Encouraged the patient to call the number on the back of her insurance card for customer service in order to access OTC benefit line.  Follow Up Plan: SW will follow up with the patient over the next week to confirm supplies ordered.  Daneen Schick, BSW, CDP Social Worker, Certified Dementia Practitioner [place] [facility] [place] Management [phone]

## 2020-03-21 ENCOUNTER — Telehealth: Payer: Self-pay

## 2020-03-21 NOTE — Telephone Encounter (Signed)
[name], FNP  Candiss Norse T, CMA  Call to make patient aware I have placed a referral to the podiatrist for her toe   LVM for pt

## 2020-03-22 ENCOUNTER — Other Ambulatory Visit: Payer: Self-pay

## 2020-03-22 ENCOUNTER — Ambulatory Visit: Payer: Medicare Other

## 2020-03-22 VITALS — BP 128/86

## 2020-03-22 DIAGNOSIS — M6281 Muscle weakness (generalized): Secondary | ICD-10-CM | POA: Diagnosis not present

## 2020-03-22 DIAGNOSIS — R2689 Other abnormalities of gait and mobility: Secondary | ICD-10-CM

## 2020-03-22 DIAGNOSIS — I69351 Hemiplegia and hemiparesis following cerebral infarction affecting right dominant side: Secondary | ICD-10-CM | POA: Diagnosis not present

## 2020-03-22 NOTE — Patient Instructions (Signed)
Access Code: [email or web address] URL: [email or web address] Date: 03/12/2020 Prepared by: Cherly Anderson  Exercises Supine Bridge - 1 x daily - 5 x weekly - 2 sets - 10 reps Clamshell - 1 x daily - 5 x weekly - 2 sets - 10 reps Standing Hip Abduction with Counter Support - 1 x daily - 5 x weekly - 2 sets - 10 reps Standing Hip Extension with Counter Support - 1 x daily - 5 x weekly - 2 sets - 10 reps Mini Squat with Counter Support - 1 x daily - 5 x weekly - 2 sets - 10 reps Standing Gastroc Stretch at Counter - 2 x daily - 7 x weekly - 1 sets - 3 reps - 30 sec hold Tandem Stance - 2 x daily - 7 x weekly - 1 sets - 3 reps - 20-30 sec hold Standing in corner eyes open and eyes closed - 2 x daily - 7 x weekly - 1 sets - 3 reps - 20-30sec hold

## 2020-03-22 NOTE — Therapy (Signed)
[facility] [address] [place], [place], [address] Phone: [phone]   Fax:  [phone]  Physical Therapy Treatment  Patient Details  Name: Margaret Webster MRN: [id number] Date of Birth: [birth year] Referring Provider (PT): Referred by Barb Merino but will be following with PCP [name]   Encounter Date: 03/22/2020  PT End of Session - 03/22/20 0857    Visit Number  5    Number of Visits  17    Date for PT Re-Evaluation  0000000   90 day cert but 60 day poc   Authorization Type  [employer] medicare so 10th visit progress note, FOTO    PT Start Time  [phone]   PT running behind   PT Stop Time  0928    PT Time Calculation (min)  33 min    Equipment Utilized During Treatment  Gait belt   RW, SPC   Activity Tolerance  Patient tolerated treatment well    Behavior During Therapy  [facility] for tasks assessed/performed       Past Medical History:  Diagnosis Date  . Arthritis    bil shoulders  . Bronchitis   . Chronic back pain   . Diabetes mellitus    stopped Mrtformin 1 mo ago, made her "sick"  . Hypertension   . Hypothyroidism   . Stroke ([place])   . Thyroid disease   . Trigger thumb of right hand     Past Surgical History:  Procedure Laterality Date  . BACK SURGERY  1993  . CARPAL TUNNEL RELEASE  08/2015  . CARPAL TUNNEL RELEASE Left 09/10/2017   Procedure: CARPAL TUNNEL RELEASE;  Surgeon: Leanora Cover, MD;  Location: [facility];  Service: Orthopedics;  Laterality: Left;  . CERVICAL SPINE SURGERY Right 11/2012  . OOPHORECTOMY    . TRIGGER FINGER RELEASE Right 08/06/2017   Procedure: RIGHT THUMB TRIGGER RELEASE;  Surgeon: Leanora Cover, MD;  Location: [facility];  Service: Orthopedics;  Laterality: Right;    Vitals:   03/22/20 0901  BP: 128/86    Subjective Assessment - 03/22/20 0857    Subjective  Pt reports she has not been feeling great since yesterday. Has been having a  headache since yesterday. Thinks it is from sinus stuff as eyes have been running. Having some pain in right lower leg.    Pertinent History  type 2 diabetes, hypertension, hypothyroidism, smoker, stage II chronic kidney disease    Patient Stated Goals  Pt wants to be able to walk better. Also working on quitting smoking.    Currently in Pain?  Yes    Pain Location  Leg    Pain Orientation  Right    Pain Descriptors / Indicators  Aching   just nagging   Pain Type  Acute pain    Pain Onset  1 to 4 weeks ago    Pain Frequency  Intermittent    Multiple Pain Sites  Yes    Pain Location  Head    Pain Orientation  Left    Pain Descriptors / Indicators  --   nagging   Pain Type  Acute pain    Pain Onset  1 to 4 weeks ago    Pain Frequency  Intermittent                        [facility] Adult PT Treatment/Exercise - 03/22/20 [id number]      Ambulation/Gait   Ambulation/Gait  Yes    Ambulation/Gait Assistance  5: Supervision    Ambulation/Gait Assistance Details  Pt was cued for upright posture and try to increase step length.    Ambulation Distance (Feet)  575 Feet    Assistive device  Straight cane    Gait Pattern  Step-through pattern    Ambulation Surface  Level;Indoor    Ramp  5: Supervision    Ramp Details (indicate cue type and reason)  with SPC with cues to lean forward a little going up and back a little going down.    Curb  5: Supervision    Curb Details (indicate cue type and reason)  with SPC verbal cues for sequence      Neuro Re-ed    Neuro Re-ed Details   Standing in corner: feet together x 30 sec eyes open and eyes closed, tandem stance x 30 sec each position. Pt lost balance towards end of tandem.      Exercises   Exercises  Other Exercises    Other Exercises   Standing gastoc stretch at counter 30 sec x 3 each leg. Pt was given verbal cues for form  and reported the stretch feeling really good.             PT Education - 03/22/20 1141    Education  Details  Added to HEP gastroc stretch and corner balance activities.    Person(s) Educated  Patient    Methods  Explanation;Demonstration;Handout    Comprehension  Verbalized understanding;Returned demonstration       PT Short Term Goals - 03/08/20 1032      PT SHORT TERM GOAL #1   Title  Pt will be independent with initial HEP for strengthening and balance to continue at home.    Time  4    Period  Weeks    Status  New    Target Date  04/07/20      PT SHORT TERM GOAL #2   Title  Pt will increase 30 sec sit to stand from 2 reps to 5 or more for improved functional strength.    Baseline  2 reps from standard chair with heavy UE support 03/08/20    Time  4    Period  Weeks    Status  New    Target Date  04/07/20      PT SHORT TERM GOAL #3   Title  Pt will decrease TUG from 62 sec to <40 sec for improved balance and functional mobility.    Baseline  62 sec without AD on 03/08/20    Time  4    Period  Weeks    Target Date  04/07/20      PT SHORT TERM GOAL #4   Title  Pt will ambulate >300' with LRAD mod I on level surfaces for improved household and short community distances.    Baseline  50' CGA    Time  4    Period  Weeks    Status  New    Target Date  04/07/20        PT Long Term Goals - 03/12/20 1212      PT LONG TERM GOAL #1   Title  Pt will be independent with progressive HEP for strengthening, balance and aerobic exercise to continue gains on own.    Time  8    Period  Weeks    Status  New      PT LONG TERM GOAL #2  Title  Pt will increase Berg Balance score by 6 points for improved balance and decreased fall risk.    Baseline  03/12/20 47/56    Time  8    Period  Weeks    Status  New      PT LONG TERM GOAL #3   Title  Pt will ambulate up/down 4 stairs with reciprocal pattern without railing for improved functional strength.    Time  8    Period  Weeks    Status  New      PT LONG TERM GOAL #4   Title  Pt will ambulate >500' on varied surfaces without  AD independently for improved community mobility.    Time  8    Period  Weeks    Status  New      PT LONG TERM GOAL #5   Title  Pt will increase gait speed by >0.18m/s for improved gait safety.    Baseline  03/12/20 gait speed=0.18m/s with RW    Time  8    Period  Weeks    Status  New            Plan - 03/22/20 1145    Clinical Impression Statement  Pt continues to show improving stability and gait quality with SPC. Able to increase gait distance again.    Personal Factors and Comorbidities  Comorbidity 3+    Comorbidities  type 2 diabetes, hypertension, hypothyroidism, smoker, stage II chronic kidney disease    Examination-Activity Limitations  Locomotion Level;Stand;Stairs;Transfers    Examination-Participation Restrictions  Community Activity;Yard Work;Shop   work   Database administrator  Good    PT Frequency  2x / week    PT Duration  8 weeks    PT Treatment/Interventions  ADLs/Self Care Home Management;Cryotherapy;Moist Heat;DME Instruction;Gait training;Stair training;Functional mobility training;Neuromuscular re-education;Balance training;Therapeutic exercise;Therapeutic activities;Patient/family education;Manual techniques;Passive range of motion;Vestibular    PT Next Visit Plan  Gait trianing with SPC. Standing balance progression, right leg strengthening.    Consulted and Agree with Plan of Care  Patient       Patient will benefit from skilled therapeutic intervention in order to improve the following deficits and impairments:  Abnormal gait, Decreased activity tolerance, Decreased balance, Decreased endurance, Decreased knowledge of use of DME, Decreased mobility, Decreased range of motion, Decreased strength, Pain, Impaired sensation, Impaired tone, Impaired UE functional use  Visit Diagnosis: Other abnormalities of gait and mobility  Muscle weakness (generalized)     Problem List Patient Active  Problem List   Diagnosis Date Noted  . CVA (cerebral vascular accident) ([place]) 02/26/2020  . CKD (chronic kidney disease) stage 3, GFR 30-59 ml/min   . Bradycardia   . Elevated TSH   . Multiple joint pain 11/07/2019  . Acute pain of right shoulder 09/19/2019  . Right hip pain 09/19/2019  . Prediabetes 09/08/2018  . Chest pain 01/10/2015  . DM (diabetes mellitus) ([place]) 01/10/2015  . Hypertension 01/10/2015  . Hypothyroidism 01/10/2015  . Headache 01/10/2015  . Abdominal pain 01/10/2015  . Vaginitis 04/07/2014  . Pain 04/07/2014  . Vaginal irritation 04/07/2014  . Boil of buttock 04/06/2014  . BV (bacterial vaginosis) 04/06/2014  . Unspecified symptom associated with female genital organs 11/16/2013  . Candidiasis of vulva and vagina 11/16/2013  . Vaginitis and vulvovaginitis, unspecified 06/07/2013  . Pain aggravated by activities of daily living 06/07/2013  . Nonspecific abnormal finding in stool contents 12/02/2011  . Diabetes  mellitus without mention of complication [id number]  . Chronic back pain     Electa Sniff, PT, DPT, NCS 03/22/2020, 11:46 AM  [facility] [address] [place], [place], [address] Phone: [phone]   Fax:  [phone]  Name: Margaret Webster MRN: [id number] Date of Birth: 1961/08/06

## 2020-03-27 ENCOUNTER — Encounter: Payer: Self-pay | Admitting: Nurse Practitioner

## 2020-03-27 ENCOUNTER — Ambulatory Visit: Payer: Medicare Other | Attending: Internal Medicine

## 2020-03-27 DIAGNOSIS — G8929 Other chronic pain: Secondary | ICD-10-CM | POA: Insufficient documentation

## 2020-03-27 DIAGNOSIS — R2689 Other abnormalities of gait and mobility: Secondary | ICD-10-CM | POA: Insufficient documentation

## 2020-03-27 DIAGNOSIS — M25512 Pain in left shoulder: Secondary | ICD-10-CM | POA: Insufficient documentation

## 2020-03-27 DIAGNOSIS — I69351 Hemiplegia and hemiparesis following cerebral infarction affecting right dominant side: Secondary | ICD-10-CM | POA: Insufficient documentation

## 2020-03-27 DIAGNOSIS — M25511 Pain in right shoulder: Secondary | ICD-10-CM | POA: Insufficient documentation

## 2020-03-27 DIAGNOSIS — M6281 Muscle weakness (generalized): Secondary | ICD-10-CM | POA: Insufficient documentation

## 2020-03-29 ENCOUNTER — Ambulatory Visit (INDEPENDENT_AMBULATORY_CARE_PROVIDER_SITE_OTHER): Payer: Medicare Other

## 2020-03-29 ENCOUNTER — Ambulatory Visit: Payer: Self-pay

## 2020-03-29 ENCOUNTER — Other Ambulatory Visit: Payer: Self-pay

## 2020-03-29 ENCOUNTER — Ambulatory Visit: Payer: Medicare Other | Admitting: Occupational Therapy

## 2020-03-29 ENCOUNTER — Ambulatory Visit: Payer: Medicare Other

## 2020-03-29 DIAGNOSIS — M1991 Primary osteoarthritis, unspecified site: Secondary | ICD-10-CM | POA: Diagnosis not present

## 2020-03-29 DIAGNOSIS — I10 Essential (primary) hypertension: Secondary | ICD-10-CM | POA: Diagnosis not present

## 2020-03-29 DIAGNOSIS — R7303 Prediabetes: Secondary | ICD-10-CM

## 2020-03-29 DIAGNOSIS — N1831 Chronic kidney disease, stage 3a: Secondary | ICD-10-CM

## 2020-03-29 DIAGNOSIS — R2689 Other abnormalities of gait and mobility: Secondary | ICD-10-CM | POA: Diagnosis not present

## 2020-03-29 DIAGNOSIS — M25511 Pain in right shoulder: Secondary | ICD-10-CM | POA: Diagnosis not present

## 2020-03-29 DIAGNOSIS — I6389 Other cerebral infarction: Secondary | ICD-10-CM

## 2020-03-29 DIAGNOSIS — G8929 Other chronic pain: Secondary | ICD-10-CM | POA: Diagnosis not present

## 2020-03-29 DIAGNOSIS — M6281 Muscle weakness (generalized): Secondary | ICD-10-CM | POA: Diagnosis not present

## 2020-03-29 DIAGNOSIS — M25512 Pain in left shoulder: Secondary | ICD-10-CM | POA: Diagnosis not present

## 2020-03-29 DIAGNOSIS — I69351 Hemiplegia and hemiparesis following cerebral infarction affecting right dominant side: Secondary | ICD-10-CM | POA: Diagnosis not present

## 2020-03-29 NOTE — Therapy (Signed)
[facility] [address] [place], [place], [address] Phone: [phone]   Fax:  [phone]  Physical Therapy Treatment  Patient Details  Name: Margaret Webster MRN: [id number] Date of Birth: Apr 17, 1961 Referring Provider (PT): Referred by Barb Merino but will be following with PCP [name]   Encounter Date: 03/29/2020  PT End of Session - 03/29/20 0850    Visit Number  6    Number of Visits  17    Date for PT Re-Evaluation  0000000   90 day cert but 60 day poc   Authorization Type  [employer] medicare so 10th visit progress note, FOTO    PT Start Time  0847    PT Stop Time  0928    PT Time Calculation (min)  41 min    Equipment Utilized During Treatment  Gait belt   RW, SPC   Activity Tolerance  Patient tolerated treatment well    Behavior During Therapy  [facility] for tasks assessed/performed       Past Medical History:  Diagnosis Date  . Arthritis    bil shoulders  . Bronchitis   . Chronic back pain   . Diabetes mellitus    stopped Mrtformin 1 mo ago, made her "sick"  . Hypertension   . Hypothyroidism   . Stroke ([place])   . Thyroid disease   . Trigger thumb of right hand     Past Surgical History:  Procedure Laterality Date  . BACK SURGERY  1993  . CARPAL TUNNEL RELEASE  08/2015  . CARPAL TUNNEL RELEASE Left 09/10/2017   Procedure: CARPAL TUNNEL RELEASE;  Surgeon: Leanora Cover, MD;  Location: [facility];  Service: Orthopedics;  Laterality: Left;  . CERVICAL SPINE SURGERY Right 11/2012  . OOPHORECTOMY    . TRIGGER FINGER RELEASE Right 08/06/2017   Procedure: RIGHT THUMB TRIGGER RELEASE;  Surgeon: Leanora Cover, MD;  Location: [facility];  Service: Orthopedics;  Laterality: Right;    There were no vitals filed for this visit.  Subjective Assessment - 03/29/20 0851    Subjective  Pt reports that she had a nice weekend but missed her appointment as she was out of town and her  [place] the [place] in [place].    Pertinent History  type 2 diabetes, hypertension, hypothyroidism, smoker, stage II chronic kidney disease    Patient Stated Goals  Pt wants to be able to walk better. Also working on quitting smoking.    Currently in Pain?  Yes    Pain Location  Hip    Pain Orientation  Right    Pain Descriptors / Indicators  Tightness    Pain Type  Acute pain    Pain Onset  1 to 4 weeks ago    Pain Frequency  Intermittent    Pain Onset  1 to 4 weeks ago         [facility] PT Assessment - 03/29/20 0853      Functional Gait  Assessment   Gait assessed   Yes    Gait Level Surface  Walks 20 ft, slow speed, abnormal gait pattern, evidence for imbalance or deviates 10-15 in outside of the 12 in walkway width. Requires more than 7 sec to ambulate 20 ft.    Change in Gait Speed  Able to smoothly change walking speed without loss of balance or gait deviation. Deviate no more than 6 in outside of the 12 in walkway width.  Gait with Horizontal Head Turns  Performs head turns smoothly with no change in gait. Deviates no more than 6 in outside 12 in walkway width    Gait with Vertical Head Turns  Performs head turns with no change in gait. Deviates no more than 6 in outside 12 in walkway width.    Gait and Pivot Turn  Pivot turns safely within 3 sec and stops quickly with no loss of balance.    Step Over Obstacle  Is able to step over 2 stacked shoe boxes taped together (9 in total height) without changing gait speed. No evidence of imbalance.    Gait with Narrow Base of Support  Is able to ambulate for 10 steps heel to toe with no staggering.    Gait with Eyes Closed  Walks 20 ft, uses assistive device, slower speed, mild gait deviations, deviates 6-10 in outside 12 in walkway width. Ambulates 20 ft in less than 9 sec but greater than 7 sec.    Ambulating Backwards  Walks 20 ft, uses assistive device, slower speed, mild gait deviations, deviates 6-10 in outside 12 in  walkway width.    Steps  Alternating feet, must use rail.    Total Score  25                    [facility] Adult PT Treatment/Exercise - 03/29/20 0853      Transfers   Transfers  Sit to Stand;Stand to Sit    Sit to Stand  7: Independent    Five time sit to stand comments   8 from chair with hands in lap    Stand to Sit  7: Independent      Ambulation/Gait   Ambulation/Gait  Yes    Ambulation/Gait Assistance  5: Supervision    Ambulation/Gait Assistance Details  Pt denied any pain in right foot with gait today.    Ambulation Distance (Feet)  690 Feet    Assistive device  None   right post surgical shoe donned   Gait Pattern  Step-through pattern    Ambulation Surface  Level;Indoor      Neuro Re-ed    Neuro Re-ed Details   In // bars over blue mat: marching gait without UE support 6' x 6, tandem gait 6' x 6, reciprocal steps over 3 hurdles over mat 6' x 6 then sidestepping over hurdles 6' x 6. Supervision and verbal cues for form with activities but patient with much improved stability.      Exercises   Exercises  Other Exercises    Other Exercises   Pt reports that she is having some tightness in right side. Pt instructed in seated left lateral trunk flexion bringing arm over head and supine hooklying trunk rotation with arms added in 30 sec x 3. Pt reports it felt really good.             PT Education - 03/29/20 1322    Education Details  Added trunk stretches to HEP. Advised she does not need to use walker at this time.    Person(s) Educated  Patient    Methods  Explanation;Demonstration;Handout    Comprehension  Verbalized understanding       PT Short Term Goals - 03/29/20 0902      PT SHORT TERM GOAL #1   Title  Pt will be independent with initial HEP for strengthening and balance to continue at home.    Time  4    Period  Weeks    Status  New    Target Date  04/07/20      PT SHORT TERM GOAL #2   Title  Pt will increase 30 sec sit to stand from 2  reps to 5 or more for improved functional strength.    Baseline  2 reps from standard chair with heavy UE support 03/08/20, 8 sit to stands with hands in lap    Time  4    Period  Weeks    Status  Achieved    Target Date  04/07/20      PT SHORT TERM GOAL #3   Title  Pt will decrease TUG from 62 sec to <40 sec for improved balance and functional mobility.    Baseline  62 sec without AD on 03/08/20, 03/29/20 8 sec without AD    Time  4    Period  Weeks    Status  Achieved    Target Date  04/07/20      PT SHORT TERM GOAL #4   Title  Pt will ambulate >300' with LRAD mod I on level surfaces for improved household and short community distances.    Baseline  50' CGA    Time  4    Period  Weeks    Status  New    Target Date  04/07/20        PT Long Term Goals - 03/29/20 1323      PT LONG TERM GOAL #1   Title  Pt will be independent with progressive HEP for strengthening, balance and aerobic exercise to continue gains on own.    Time  8    Period  Weeks    Status  New      PT LONG TERM GOAL #2   Title  Pt will increase Berg Balance score by 6 points for improved balance and decreased fall risk.    Baseline  03/12/20 47/56    Time  8    Period  Weeks    Status  New      PT LONG TERM GOAL #3   Title  Pt will ambulate up/down 4 stairs with reciprocal pattern without railing for improved functional strength.    Time  8    Period  Weeks    Status  New      PT LONG TERM GOAL #4   Title  Pt will ambulate >500' on varied surfaces without AD independently for improved community mobility.    Time  8    Period  Weeks    Status  New      PT LONG TERM GOAL #5   Title  Pt will increase gait speed by >0.8m/s for improved gait safety.    Baseline  03/12/20 gait speed=0.45m/s with RW    Time  8    Period  Weeks    Status  New      Additional Long Term Goals   Additional Long Term Goals  Yes      PT LONG TERM GOAL #6   Title  Pt will increase FGA from 25/30 to 27 or more for improved  balance and gait safety.    Baseline  25/30 on 03/29/20    Time  8    Period  Weeks    Status  New    Target Date  05/07/20            Plan - 03/29/20 1325    Clinical Impression Statement  PT  started to reassess goals early as patient showing much improvement. Pt was able to ambulate without AD today on level surfaces with no LOB. Improving activity tolerance with little to no breaks between activities. Pt improved her TUG to 8 sec showing decreased fall risk. Increased 30 sec sit to stand to 8 showing improving functional strength. FGA was asssessed and score of 25/30 indicates low fall risk.    Personal Factors and Comorbidities  Comorbidity 3+    Comorbidities  type 2 diabetes, hypertension, hypothyroidism, smoker, stage II chronic kidney disease    Examination-Activity Limitations  Locomotion Level;Stand;Stairs;Transfers    Examination-Participation Restrictions  Community Activity;Yard Work;Shop   work   Database administrator  Good    PT Frequency  2x / week    PT Duration  8 weeks    PT Treatment/Interventions  ADLs/Self Care Home Management;Cryotherapy;Moist Heat;DME Instruction;Gait training;Stair training;Functional mobility training;Neuromuscular re-education;Balance training;Therapeutic exercise;Therapeutic activities;Patient/family education;Manual techniques;Passive range of motion;Vestibular    PT Next Visit Plan  Gait trianing on varied surfaces without AD. Standing balance progression, right leg strengthening.    Consulted and Agree with Plan of Care  Patient       Patient will benefit from skilled therapeutic intervention in order to improve the following deficits and impairments:  Abnormal gait, Decreased activity tolerance, Decreased balance, Decreased endurance, Decreased knowledge of use of DME, Decreased mobility, Decreased range of motion, Decreased strength, Pain, Impaired sensation, Impaired tone,  Impaired UE functional use  Visit Diagnosis: Other abnormalities of gait and mobility  Muscle weakness (generalized)     Problem List Patient Active Problem List   Diagnosis Date Noted  . CVA (cerebral vascular accident) ([place]) 02/26/2020  . CKD (chronic kidney disease) stage 3, GFR 30-59 ml/min   . Bradycardia   . Elevated TSH   . Multiple joint pain 11/07/2019  . Acute pain of right shoulder 09/19/2019  . Right hip pain 09/19/2019  . Prediabetes 09/08/2018  . Chest pain 01/10/2015  . DM (diabetes mellitus) ([place]) 01/10/2015  . Hypertension 01/10/2015  . Hypothyroidism 01/10/2015  . Headache 01/10/2015  . Abdominal pain 01/10/2015  . Vaginitis 04/07/2014  . Pain 04/07/2014  . Vaginal irritation 04/07/2014  . Boil of buttock 04/06/2014  . BV (bacterial vaginosis) 04/06/2014  . Unspecified symptom associated with female genital organs 11/16/2013  . Candidiasis of vulva and vagina 11/16/2013  . Vaginitis and vulvovaginitis, unspecified 06/07/2013  . Pain aggravated by activities of daily living 06/07/2013  . Nonspecific abnormal finding in stool contents 12/02/2011  . Diabetes mellitus without mention of complication [id number]  . Chronic back pain     Electa Sniff, PT, DPT, NCS 03/29/2020, 1:29 PM  [facility] [address] [place], [place], [address] Phone: [phone]   Fax:  [phone]  Name: Margaret Webster MRN: [id number] Date of Birth: 1961/08/22

## 2020-03-29 NOTE — Chronic Care Management (AMB) (Signed)
Chronic Care Management   Initial Visit Note  03/29/2020 Name: Margaret Webster MRN: [id number] DOB: 10-Oct-1961  Referred by: [name], FNP Reason for referral : No chief complaint on file.   Margaret Webster is a 59 y.o. year old female who is a primary care patient of [name], [place]. The care management team was consulted for assistance with chronic disease management and care coordination needs related to HTN, Osteoarthritis, Prediabetes, CKD III  Review of patient status, including review of consultants reports, relevant laboratory and other test results, and collaboration with appropriate care team members and the patient's provider was performed as part of comprehensive patient evaluation and provision of chronic care management services.    I initiated and established the plan of care for [place] during one on one collaboration with my clinical care management colleague Daneen Schick BSW who is also engaged with this patient to address social work needs.   Outpatient Encounter Medications as of 03/29/2020  Medication Sig  . acetaminophen (TYLENOL) 500 MG tablet Take 1,000 mg by mouth every 6 (six) hours as needed for mild pain.  Marland Kitchen amLODipine (NORVASC) 2.5 MG tablet Take 1 tablet (2.5 mg total) by mouth daily.  Marland Kitchen aspirin EC 81 MG tablet Take 81 mg by mouth once.  Marland Kitchen buPROPion (WELLBUTRIN XL) 150 MG 24 hr tablet Take 1 tablet (150 mg total) by mouth every morning. (Patient not taking: Reported on 03/08/2020)  . diclofenac Sodium (VOLTAREN) 1 % GEL Apply 2 g topically 4 (four) times daily.  Marland Kitchen lidocaine (LIDODERM) 5 % Place 1 patch onto the skin daily. Remove & Discard patch within 12 hours or as directed by MD  . metFORMIN (GLUCOPHAGE) 500 MG tablet Take 1 tablet (500 mg total) by mouth 2 (two) times daily with a meal.  . mometasone (NASONEX) 50 MCG/ACT nasal spray Place 2 sprays into the nose daily.  . polyvinyl alcohol (LIQUIFILM TEARS) 1.4 % ophthalmic solution Place  1 drop into both eyes as needed for dry eyes.  . rosuvastatin (CRESTOR) 40 MG tablet Take 1 tablet (40 mg total) by mouth daily.  . valACYclovir (VALTREX) 500 MG tablet Take 500 mg by mouth daily. Started on 01-28-20 DS 30   No facility-administered encounter medications on file as of 03/29/2020.     Goals Addressed    . Assist with Chronic Care Management and Care Coordination needs       CARE PLAN ENTRY (see longtitudinal plan of care for additional care plan information)   Current Barriers:  . Chronic Disease Management support, education, and care coordination needs related to HTN, Osteoarthritis, Prediabetes, CKD III  Case Manager Clinical Goal(s):  Marland Kitchen Over the next 30 days, patient will work with BSW to address needs related to  obtaining glucose monitor and BP cuff   in patient with HTN, Osteoarthritis, Prediabetes, CKD III  . Interventions:  . Collaborated with BSW to initiate plan of care to address needs related to Care Coordination and Chronic Care Management needs in patient with HTN, Osteoarthritis, Prediabetes, CKD III  Patient Self Care Activities:  . Self administers medications as prescribed . Attends all scheduled provider appointments . Calls pharmacy for medication refills . Calls provider office for new concerns or questions  Initial goal documentation        Telephone follow up appointment with care management team member scheduled for: 04/24/20  Barb Merino, RN, BSN, CCM Care Management Coordinator [facility]  Direct Phone: [phone]

## 2020-03-29 NOTE — Patient Instructions (Signed)
Access Code: [email or web address] URL: [email or web address] Date: 03/12/2020 Prepared by: Cherly Anderson  Exercises Supine Bridge - 1 x daily - 5 x weekly - 2 sets - 10 reps Clamshell - 1 x daily - 5 x weekly - 2 sets - 10 reps Standing Hip Abduction with Counter Support - 1 x daily - 5 x weekly - 2 sets - 10 reps Standing Hip Extension with Counter Support - 1 x daily - 5 x weekly - 2 sets - 10 reps Mini Squat with Counter Support - 1 x daily - 5 x weekly - 2 sets - 10 reps Standing Gastroc Stretch at Counter - 2 x daily - 7 x weekly - 1 sets - 3 reps - 30 sec hold Tandem Stance - 2 x daily - 7 x weekly - 1 sets - 3 reps - 20-30 sec hold Standing in corner eyes open and eyes closed - 2 x daily - 7 x weekly - 1 sets - 3 reps - 20-30sec hold Supine Lower Trunk Rotation - 1 x daily - 7 x weekly - 10 reps - 3 sets Cross Legged Seat with Side Bend - 1 x daily - 7 x weekly - 3 sets - 10 reps

## 2020-03-30 NOTE — Patient Instructions (Signed)
Social Worker Visit Information  Goals we discussed today:  Goals Addressed            This Visit's Progress   . Collaborate with RN Care Manager to perform appropriate assessments to determine care management and care coordination needs       CARE PLAN ENTRY (see longitudinal plan of care for additional care plan information)  Current Barriers:  . Recent inpatient stay in the last 30 days due to CVA . Inability to monitor BS and BP due to lack of equipment . Knowledge barriers related to management of HTN and prediabetes  Social Work Clinical Goal(s):  Marland Kitchen Over the next 30 days the patient will follow up with primary care provider to obtain desired supplies as directed by SW . Over the next 90 days the patient will work with care management team to address barriers related to management of HTN  CCM SW Interventions: Completed 03/29/20 with the patient . Inter-disciplinary care team collaboration (see longitudinal plan of care) . Performed chart review to note recent IP stay in May due to CVA . Successful outbound call placed to the patient to determine the patient is in need of DME supplies o "I can't check by blood pressure or blood sugar because I don't have the machines" . Collaboration with primary provider, [name] FNP, to request orders for automatic BP cuff, glucometer, and testing strips be sent on behalf of the patient . Assessed for acute care management needs o Patient denies at this time . Collaboration with RN Care Manager regarding patient interest in working with care management team as well as plan to obtain DME supplies . Scheduled follow up call over the next 30 days  Patient Self Care Activities:  . Patient verbalizes understanding of plan to work with care management team regarding ongoing care coordination needs . Self administers medications as prescribed . Attends all scheduled provider appointments . Calls provider office for new concerns or  questions  Initial goal documentation         Follow Up Plan: SW will follow up with patient by phone over the next month.   Daneen Schick, BSW, CDP Social Worker, Certified Dementia Practitioner [place] [facility] [phone]

## 2020-03-30 NOTE — Chronic Care Management (AMB) (Signed)
Chronic Care Management    Social Work Follow Up Note  03/30/2020 Name: Margaret Webster MRN: [id number] DOB: 12-12-1960  Margaret Webster is a 59 y.o. year old female who is a primary care patient of Margaret Webster, [place]. The CCM team was consulted for assistance with care coordination.   Review of patient status, including review of consultants reports, other relevant assessments, and collaboration with appropriate care team members and the patient's provider was performed as part of comprehensive patient evaluation and provision of chronic care management services.    SDOH (Social Determinants of Health) assessments performed: No    Outpatient Encounter Medications as of 03/29/2020  Medication Sig  . acetaminophen (TYLENOL) 500 MG tablet Take 1,000 mg by mouth every 6 (six) hours as needed for mild pain.  Marland Kitchen amLODipine (NORVASC) 2.5 MG tablet Take 1 tablet (2.5 mg total) by mouth daily.  Marland Kitchen aspirin EC 81 MG tablet Take 81 mg by mouth once.  Marland Kitchen buPROPion (WELLBUTRIN XL) 150 MG 24 hr tablet Take 1 tablet (150 mg total) by mouth every morning. (Patient not taking: Reported on 03/08/2020)  . diclofenac Sodium (VOLTAREN) 1 % GEL Apply 2 g topically 4 (four) times daily.  Marland Kitchen lidocaine (LIDODERM) 5 % Place 1 patch onto the skin daily. Remove & Discard patch within 12 hours or as directed by MD  . metFORMIN (GLUCOPHAGE) 500 MG tablet Take 1 tablet (500 mg total) by mouth 2 (two) times daily with a meal.  . mometasone (NASONEX) 50 MCG/ACT nasal spray Place 2 sprays into the nose daily.  . polyvinyl alcohol (LIQUIFILM TEARS) 1.4 % ophthalmic solution Place 1 drop into both eyes as needed for dry eyes.  . rosuvastatin (CRESTOR) 40 MG tablet Take 1 tablet (40 mg total) by mouth daily.  . valACYclovir (VALTREX) 500 MG tablet Take 500 mg by mouth daily. Started on 01-28-20 DS 30   No facility-administered encounter medications on file as of 03/29/2020.     Goals Addressed            This Visit's  Progress   . Collaborate with RN Care Manager to perform appropriate assessments to determine care management and care coordination needs       CARE PLAN ENTRY (see longitudinal plan of care for additional care plan information)  Current Barriers:  . Recent inpatient stay in the last 30 days due to CVA . Inability to monitor BS and BP due to lack of equipment . Knowledge barriers related to management of HTN and prediabetes  Social Work Clinical Goal(s):  Marland Kitchen Over the next 30 days the patient will follow up with primary care provider to obtain desired supplies as directed by SW . Over the next 90 days the patient will work with care management team to address barriers related to management of HTN  CCM SW Interventions: Completed 03/29/20 with the patient . Inter-disciplinary care team collaboration (see longitudinal plan of care) . Performed chart review to note recent IP stay in May due to CVA . Successful outbound call placed to the patient to determine the patient is in need of DME supplies o "I can't check by blood pressure or blood sugar because I don't have the machines" . Collaboration with primary provider, Margaret Brine FNP, to request orders for automatic BP cuff, glucometer, and testing strips be sent on behalf of the patient . Assessed for acute care management needs o Patient denies at this time . Collaboration with RN Care Manager regarding patient interest in  working with care management team as well as plan to obtain DME supplies . Scheduled follow up call over the next 30 days  Patient Self Care Activities:  . Patient verbalizes understanding of plan to work with care management team regarding ongoing care coordination needs . Self administers medications as prescribed . Attends all scheduled provider appointments . Calls provider office for new concerns or questions  Initial goal documentation         Follow Up Plan: SW will follow up with patient by phone over the  next month.   Daneen Schick, BSW, CDP Social Worker, Certified Dementia Practitioner [place] [facility] [phone]  Total time spent performing care coordination and/or care management activities with the patient by phone or face to face = 15 minutes.

## 2020-04-02 ENCOUNTER — Other Ambulatory Visit: Payer: Self-pay

## 2020-04-02 ENCOUNTER — Ambulatory Visit: Payer: Medicare Other

## 2020-04-02 VITALS — BP 142/78

## 2020-04-02 DIAGNOSIS — I69351 Hemiplegia and hemiparesis following cerebral infarction affecting right dominant side: Secondary | ICD-10-CM | POA: Diagnosis not present

## 2020-04-02 DIAGNOSIS — M25512 Pain in left shoulder: Secondary | ICD-10-CM | POA: Diagnosis not present

## 2020-04-02 DIAGNOSIS — G8929 Other chronic pain: Secondary | ICD-10-CM | POA: Diagnosis not present

## 2020-04-02 DIAGNOSIS — M6281 Muscle weakness (generalized): Secondary | ICD-10-CM

## 2020-04-02 DIAGNOSIS — R2689 Other abnormalities of gait and mobility: Secondary | ICD-10-CM

## 2020-04-02 DIAGNOSIS — M25511 Pain in right shoulder: Secondary | ICD-10-CM | POA: Diagnosis not present

## 2020-04-02 NOTE — Therapy (Signed)
[facility] [address] [place], [place], [address] Phone: [phone]   Fax:  [phone]  Physical Therapy Treatment  Patient Details  Name: Margaret Webster MRN: [id number] Date of Birth: 1961-02-14 Referring Provider (PT): Referred by Barb Merino but will be following with PCP [name]   Encounter Date: 04/02/2020  PT End of Session - 04/02/20 0846    Visit Number  7    Number of Visits  17    Date for PT Re-Evaluation  97/02/63   90 day cert but 60 day poc   Authorization Type  [employer] medicare so 10th visit progress note, FOTO    PT Start Time  0845    PT Stop Time  0927    PT Time Calculation (min)  42 min    Equipment Utilized During Treatment  Gait belt   RW, SPC   Activity Tolerance  Patient tolerated treatment well    Behavior During Therapy  [facility] for tasks assessed/performed       Past Medical History:  Diagnosis Date  . Arthritis    bil shoulders  . Bronchitis   . Chronic back pain   . Diabetes mellitus    stopped Mrtformin 1 mo ago, made her "sick"  . Hypertension   . Hypothyroidism   . Stroke ([place])   . Thyroid disease   . Trigger thumb of right hand     Past Surgical History:  Procedure Laterality Date  . BACK SURGERY  1993  . CARPAL TUNNEL RELEASE  08/2015  . CARPAL TUNNEL RELEASE Left 09/10/2017   Procedure: CARPAL TUNNEL RELEASE;  Surgeon: Leanora Cover, MD;  Location: [facility];  Service: Orthopedics;  Laterality: Left;  . CERVICAL SPINE SURGERY Right 11/2012  . OOPHORECTOMY    . TRIGGER FINGER RELEASE Right 08/06/2017   Procedure: RIGHT THUMB TRIGGER RELEASE;  Surgeon: Leanora Cover, MD;  Location: [facility];  Service: Orthopedics;  Laterality: Right;    Vitals:   04/02/20 0847  BP: (!) 142/78    Subjective Assessment - 04/02/20 0847    Subjective  Pt reports that her right leg is feeling a little heavier since yesterday. Also feeling it in  her arm some. 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    Pertinent History  type 2 diabetes, hypertension, hypothyroidism, smoker, stage II chronic kidney disease    Patient Stated Goals  Pt wants to be able to walk better. Also working on quitting smoking.    Currently in Pain?  Yes    Pain Score  0-No pain    Pain Location  Knee    Pain Orientation  Right    Pain Descriptors / Indicators  Nagging    Pain Type  Acute pain    Pain Onset  1 to 4 weeks ago    Pain Onset  1 to 4 weeks ago                        [facility] Adult PT Treatment/Exercise - 04/02/20 0851      Ambulation/Gait   Ambulation/Gait  Yes    Ambulation/Gait Assistance  5: Supervision    Ambulation/Gait Assistance Details   Pt wearing regular sneakers on both feet today. Reports right leg does not seem to coordinate quite as well as times. BP=148/80    Ambulation Distance (Feet)  1150 Feet    Assistive device  None    Gait  Pattern  Step-through pattern    Ambulation Surface  Level;Indoor      Neuro Re-ed    Neuro Re-ed Details   In hallway dynamic gait activities:  Marching gait 40' x 2, side stepping 40' x 4 with pt reporting really feeling in right hip with going towards right, backwards gait 40' x 4, reciprocal steps over 7 hurdles of varied height x 3 laps down and back then side stepping  over hurdles x 3 laps. Pt was given verbal cues for form with exercises mostly to prevent turning right foot out with side stepping.             PT Education - 04/02/20 0931    Education Details  Pt to continue with current HEP    Person(s) Educated  Patient    Methods  Explanation    Comprehension  Verbalized understanding       PT Short Term Goals - 03/29/20 0902      PT SHORT TERM GOAL #1   Title  Pt will be independent with initial HEP for strengthening and balance to continue at home.    Time  4    Period  Weeks    Status  New    Target Date  04/07/20      PT SHORT TERM GOAL #2   Title  Pt  will increase 30 sec sit to stand from 2 reps to 5 or more for improved functional strength.    Baseline  2 reps from standard chair with heavy UE support 03/08/20, 8 sit to stands with hands in lap    Time  4    Period  Weeks    Status  Achieved    Target Date  04/07/20      PT SHORT TERM GOAL #3   Title  Pt will decrease TUG from 62 sec to <40 sec for improved balance and functional mobility.    Baseline  62 sec without AD on 03/08/20, 03/29/20 8 sec without AD    Time  4    Period  Weeks    Status  Achieved    Target Date  04/07/20      PT SHORT TERM GOAL #4   Title  Pt will ambulate >300' with LRAD mod I on level surfaces for improved household and short community distances.    Baseline  50' CGA    Time  4    Period  Weeks    Status  New    Target Date  04/07/20        PT Long Term Goals - 03/29/20 1323      PT LONG TERM GOAL #1   Title  Pt will be independent with progressive HEP for strengthening, balance and aerobic exercise to continue gains on own.    Time  8    Period  Weeks    Status  New      PT LONG TERM GOAL #2   Title  Pt will increase Berg Balance score by 6 points for improved balance and decreased fall risk.    Baseline  03/12/20 47/56    Time  8    Period  Weeks    Status  New      PT LONG TERM GOAL #3   Title  Pt will ambulate up/down 4 stairs with reciprocal pattern without railing for improved functional strength.    Time  8    Period  Weeks    Status  New  PT LONG TERM GOAL #4   Title  Pt will ambulate >500' on varied surfaces without AD independently for improved community mobility.    Time  8    Period  Weeks    Status  New      PT LONG TERM GOAL #5   Title  Pt will increase gait speed by >0.68m/s for improved gait safety.    Baseline  03/12/20 gait speed=0.59m/s with RW    Time  8    Period  Weeks    Status  New      Additional Long Term Goals   Additional Long Term Goals  Yes      PT LONG TERM GOAL #6   Title  Pt will increase  FGA from 25/30 to 27 or more for improved balance and gait safety.    Baseline  25/30 on 03/29/20    Time  8    Period  Weeks    Status  New    Target Date  05/07/20            Plan - 04/02/20 0931    Clinical Impression Statement  PT continued to progress gait with dynamic activities. Showing continued improvement in gait quality with good stance time in right leg. Occasional decreased coordination of right foot placement especially as fatigues.    Personal Factors and Comorbidities  Comorbidity 3+    Comorbidities  type 2 diabetes, hypertension, hypothyroidism, smoker, stage II chronic kidney disease    Examination-Activity Limitations  Locomotion Level;Stand;Stairs;Transfers    Examination-Participation Restrictions  Community Activity;Yard Work;Shop   work   Database administrator  Good    PT Frequency  2x / week    PT Duration  8 weeks    PT Treatment/Interventions  ADLs/Self Care Home Management;Cryotherapy;Moist Heat;DME Instruction;Gait training;Stair training;Functional mobility training;Neuromuscular re-education;Balance training;Therapeutic exercise;Therapeutic activities;Patient/family education;Manual techniques;Passive range of motion;Vestibular    PT Next Visit Plan  Gait trianing on varied surfaces without AD. Standing balance progression, right leg strengthening.    Consulted and Agree with Plan of Care  Patient       Patient will benefit from skilled therapeutic intervention in order to improve the following deficits and impairments:  Abnormal gait, Decreased activity tolerance, Decreased balance, Decreased endurance, Decreased knowledge of use of DME, Decreased mobility, Decreased range of motion, Decreased strength, Pain, Impaired sensation, Impaired tone, Impaired UE functional use  Visit Diagnosis: Other abnormalities of gait and mobility  Muscle weakness (generalized)     Problem List Patient  Active Problem List   Diagnosis Date Noted  . CVA (cerebral vascular accident) ([place]) 02/26/2020  . CKD (chronic kidney disease) stage 3, GFR 30-59 ml/min   . Bradycardia   . Elevated TSH   . Multiple joint pain 11/07/2019  . Acute pain of right shoulder 09/19/2019  . Right hip pain 09/19/2019  . Prediabetes 09/08/2018  . Chest pain 01/10/2015  . DM (diabetes mellitus) ([place]) 01/10/2015  . Hypertension 01/10/2015  . Hypothyroidism 01/10/2015  . Headache 01/10/2015  . Abdominal pain 01/10/2015  . Vaginitis 04/07/2014  . Pain 04/07/2014  . Vaginal irritation 04/07/2014  . Boil of buttock 04/06/2014  . BV (bacterial vaginosis) 04/06/2014  . Unspecified symptom associated with female genital organs 11/16/2013  . Candidiasis of vulva and vagina 11/16/2013  . Vaginitis and vulvovaginitis, unspecified 06/07/2013  . Pain aggravated by activities of daily living 06/07/2013  . Nonspecific abnormal finding in stool contents 12/02/2011  .  Diabetes mellitus without mention of complication 77/08/6578  . Chronic back pain     Electa Sniff, PT, DPT, NCS 04/02/2020, 9:33 AM  [facility] [address] [place], [place], [address] Phone: [phone]   Fax:  [phone]  Name: Margaret Webster MRN: [id number] Date of Birth: 08-24-1961

## 2020-04-05 ENCOUNTER — Other Ambulatory Visit: Payer: Self-pay

## 2020-04-05 ENCOUNTER — Ambulatory Visit: Payer: Medicare Other

## 2020-04-05 DIAGNOSIS — M25512 Pain in left shoulder: Secondary | ICD-10-CM | POA: Diagnosis not present

## 2020-04-05 DIAGNOSIS — G8929 Other chronic pain: Secondary | ICD-10-CM | POA: Diagnosis not present

## 2020-04-05 DIAGNOSIS — R2689 Other abnormalities of gait and mobility: Secondary | ICD-10-CM

## 2020-04-05 DIAGNOSIS — M6281 Muscle weakness (generalized): Secondary | ICD-10-CM

## 2020-04-05 DIAGNOSIS — I69351 Hemiplegia and hemiparesis following cerebral infarction affecting right dominant side: Secondary | ICD-10-CM | POA: Diagnosis not present

## 2020-04-05 DIAGNOSIS — M25511 Pain in right shoulder: Secondary | ICD-10-CM | POA: Diagnosis not present

## 2020-04-05 NOTE — Therapy (Signed)
[facility] [address] [place], [place], [address] Phone: [phone]   Fax:  [phone]  Physical Therapy Treatment  Patient Details  Name: Margaret Webster MRN: [id number] Date of Birth: [birth year] Referring Provider (PT): Referred by Barb Merino but will be following with PCP [name]   Encounter Date: 04/05/2020   PT End of Session - 04/05/20 0853    Visit Number 8    Number of Visits 17    Date for PT Re-Evaluation 63/01/60   90 day cert but 60 day poc   Authorization Type [employer] medicare so 10th visit progress note, FOTO    PT Start Time 0850   PT running behind   PT Stop Time 0928    PT Time Calculation (min) 38 min    Equipment Utilized During Treatment Gait belt   RW, SPC   Activity Tolerance Patient tolerated treatment well    Behavior During Therapy [facility] for tasks assessed/performed           Past Medical History:  Diagnosis Date  . Arthritis    bil shoulders  . Bronchitis   . Chronic back pain   . Diabetes mellitus    stopped Mrtformin 1 mo ago, made her "sick"  . Hypertension   . Hypothyroidism   . Stroke ([place])   . Thyroid disease   . Trigger thumb of right hand     Past Surgical History:  Procedure Laterality Date  . BACK SURGERY  1993  . CARPAL TUNNEL RELEASE  08/2015  . CARPAL TUNNEL RELEASE Left 09/10/2017   Procedure: CARPAL TUNNEL RELEASE;  Surgeon: Leanora Cover, MD;  Location: [facility];  Service: Orthopedics;  Laterality: Left;  . CERVICAL SPINE SURGERY Right 11/2012  . OOPHORECTOMY    . TRIGGER FINGER RELEASE Right 08/06/2017   Procedure: RIGHT THUMB TRIGGER RELEASE;  Surgeon: Leanora Cover, MD;  Location: [facility];  Service: Orthopedics;  Laterality: Right;    There were no vitals filed for this visit.   Subjective Assessment - 04/05/20 0854    Subjective Pt reports that she is not feeling great. She slept all day yesterday and pinky toe  more sore today. She did not have it taped yesterday. Also feels she is more bloated on right side of stomach. Is having bowel movements every other day.    Pertinent History type 2 diabetes, hypertension, hypothyroidism, smoker, stage II chronic kidney disease    Patient Stated Goals Pt wants to be able to walk better. Also working on quitting smoking.    Currently in Pain? Yes    Pain Score 6     Pain Location Foot    Pain Orientation Right    Pain Descriptors / Indicators Aching    Pain Type Acute pain    Pain Onset 1 to 4 weeks ago    Pain Onset 1 to 4 weeks ago                             [facility] Adult PT Treatment/Exercise - 04/05/20 0856      Ambulation/Gait   Ambulation/Gait Yes    Ambulation/Gait Assistance 5: Supervision    Ambulation/Gait Assistance Details during activities     Ambulation Distance (Feet) 690 Feet    Assistive device None    Gait Pattern Step-through pattern    Ambulation Surface Level;Indoor      Neuro Re-ed  Neuro Re-ed Details  Gait over obstacle course: weaving in and out of 6 cones, marching gait over floor ladder, walking over blue mat x 6 bouts covering 690'. Reciprocal steps with tapping cone prior to stepping over 6 cones total x  6 bouts CGA.  Standing on rockerboard positioned ant/post maintaining level x 30 sec eyes open and eyes closed x 30 sec. Increased sway with eyes closed. Standing on rockerboard rocking ant/post x 10 then tossing bean bags 15 x 2 in basket maintaining balance. Standing on foam beam 30 sec x 20                  PT Education - 04/05/20 1923    Education Details Pt to continue with current HEP    Person(s) Educated Patient    Methods Explanation    Comprehension Verbalized understanding            PT Short Term Goals - 03/29/20 0902      PT SHORT TERM GOAL #1   Title Pt will be independent with initial HEP for strengthening and balance to continue at home.    Time 4    Period Weeks     Status New    Target Date 04/07/20      PT SHORT TERM GOAL #2   Title Pt will increase 30 sec sit to stand from 2 reps to 5 or more for improved functional strength.    Baseline 2 reps from standard chair with heavy UE support 03/08/20, 8 sit to stands with hands in lap    Time 4    Period Weeks    Status Achieved    Target Date 04/07/20      PT SHORT TERM GOAL #3   Title Pt will decrease TUG from 62 sec to <40 sec for improved balance and functional mobility.    Baseline 62 sec without AD on 03/08/20, 03/29/20 8 sec without AD    Time 4    Period Weeks    Status Achieved    Target Date 04/07/20      PT SHORT TERM GOAL #4   Title Pt will ambulate >300' with LRAD mod I on level surfaces for improved household and short community distances.    Baseline 50' CGA    Time 4    Period Weeks    Status New    Target Date 04/07/20             PT Long Term Goals - 03/29/20 1323      PT LONG TERM GOAL #1   Title Pt will be independent with progressive HEP for strengthening, balance and aerobic exercise to continue gains on own.    Time 8    Period Weeks    Status New      PT LONG TERM GOAL #2   Title Pt will increase Berg Balance score by 6 points for improved balance and decreased fall risk.    Baseline 03/12/20 47/56    Time 8    Period Weeks    Status New      PT LONG TERM GOAL #3   Title Pt will ambulate up/down 4 stairs with reciprocal pattern without railing for improved functional strength.    Time 8    Period Weeks    Status New      PT LONG TERM GOAL #4   Title Pt will ambulate >500' on varied surfaces without AD independently for improved community mobility.  Time 8    Period Weeks    Status New      PT LONG TERM GOAL #5   Title Pt will increase gait speed by >0.58m/s for improved gait safety.    Baseline 03/12/20 gait speed=0.2m/s with RW    Time 8    Period Weeks    Status New      Additional Long Term Goals   Additional Long Term Goals Yes      PT  LONG TERM GOAL #6   Title Pt will increase FGA from 25/30 to 27 or more for improved balance and gait safety.    Baseline 25/30 on 03/29/20    Time 8    Period Weeks    Status New    Target Date 05/07/20                 Plan - 04/05/20 1924    Clinical Impression Statement PT continued to progress gait activities on varied surfaces. Pt continues to tolerate more activity well.    Personal Factors and Comorbidities Comorbidity 3+    Comorbidities type 2 diabetes, hypertension, hypothyroidism, smoker, stage II chronic kidney disease    Examination-Activity Limitations Locomotion Level;Stand;Stairs;Transfers    Examination-Participation Restrictions Community Activity;Yard Work;Shop   work   Advice worker Good    PT Frequency 2x / week    PT Duration 8 weeks    PT Treatment/Interventions ADLs/Self Care Home Management;Cryotherapy;Moist Heat;DME Instruction;Gait training;Stair training;Functional mobility training;Neuromuscular re-education;Balance training;Therapeutic exercise;Therapeutic activities;Patient/family education;Manual techniques;Passive range of motion;Vestibular    PT Next Visit Plan Gait trianing on varied surfaces without AD. Standing balance progression, right leg strengthening.    Consulted and Agree with Plan of Care Patient           Patient will benefit from skilled therapeutic intervention in order to improve the following deficits and impairments:  Abnormal gait, Decreased activity tolerance, Decreased balance, Decreased endurance, Decreased knowledge of use of DME, Decreased mobility, Decreased range of motion, Decreased strength, Pain, Impaired sensation, Impaired tone, Impaired UE functional use  Visit Diagnosis: Other abnormalities of gait and mobility  Muscle weakness (generalized)     Problem List Patient Active Problem List   Diagnosis Date Noted  . CVA (cerebral vascular  accident) ([place]) 02/26/2020  . CKD (chronic kidney disease) stage 3, GFR 30-59 ml/min   . Bradycardia   . Elevated TSH   . Multiple joint pain 11/07/2019  . Acute pain of right shoulder 09/19/2019  . Right hip pain 09/19/2019  . Prediabetes 09/08/2018  . Chest pain 01/10/2015  . DM (diabetes mellitus) ([place]) 01/10/2015  . Hypertension 01/10/2015  . Hypothyroidism 01/10/2015  . Headache 01/10/2015  . Abdominal pain 01/10/2015  . Vaginitis 04/07/2014  . Pain 04/07/2014  . Vaginal irritation 04/07/2014  . Boil of buttock 04/06/2014  . BV (bacterial vaginosis) 04/06/2014  . Unspecified symptom associated with female genital organs 11/16/2013  . Candidiasis of vulva and vagina 11/16/2013  . Vaginitis and vulvovaginitis, unspecified 06/07/2013  . Pain aggravated by activities of daily living 06/07/2013  . Nonspecific abnormal finding in stool contents 12/02/2011  . Diabetes mellitus without mention of complication 85/88/5027  . Chronic back pain     Electa Sniff, PT, DPT, NCS 04/05/2020, 7:26 PM  [facility] [address] [place], [place], [address] Phone: [phone]   Fax:  [phone]  Name: Margaret Webster MRN: [id number] Date of Birth: [birth year]

## 2020-04-09 ENCOUNTER — Ambulatory Visit: Payer: Medicare Other

## 2020-04-10 ENCOUNTER — Ambulatory Visit: Payer: Self-pay

## 2020-04-10 DIAGNOSIS — I10 Essential (primary) hypertension: Secondary | ICD-10-CM

## 2020-04-10 DIAGNOSIS — R7303 Prediabetes: Secondary | ICD-10-CM

## 2020-04-10 DIAGNOSIS — M1991 Primary osteoarthritis, unspecified site: Secondary | ICD-10-CM | POA: Diagnosis not present

## 2020-04-10 DIAGNOSIS — I6389 Other cerebral infarction: Secondary | ICD-10-CM | POA: Diagnosis not present

## 2020-04-10 DIAGNOSIS — N1831 Chronic kidney disease, stage 3a: Secondary | ICD-10-CM | POA: Diagnosis not present

## 2020-04-10 NOTE — Chronic Care Management (AMB) (Signed)
Chronic Care Management    Social Work Follow Up Note  04/10/2020 Name: Margaret Webster MRN: [id number] DOB: 02/12/1961  Margaret Webster is a 59 y.o. year old female who is a primary care patient of [name], [place]. The CCM team was consulted for assistance with care coordination.   Review of patient status, including review of consultants reports, other relevant assessments, and collaboration with appropriate care team members and the patient's provider was performed as part of comprehensive patient evaluation and provision of chronic care management services.    SDOH (Social Determinants of Health) assessments performed: No    Outpatient Encounter Medications as of 04/10/2020  Medication Sig  . acetaminophen (TYLENOL) 500 MG tablet Take 1,000 mg by mouth every 6 (six) hours as needed for mild pain.  Marland Kitchen amLODipine (NORVASC) 2.5 MG tablet Take 1 tablet (2.5 mg total) by mouth daily.  Marland Kitchen aspirin EC 81 MG tablet Take 81 mg by mouth once.  Marland Kitchen buPROPion (WELLBUTRIN XL) 150 MG 24 hr tablet Take 1 tablet (150 mg total) by mouth every morning. (Patient not taking: Reported on 03/08/2020)  . diclofenac Sodium (VOLTAREN) 1 % GEL Apply 2 g topically 4 (four) times daily.  Marland Kitchen lidocaine (LIDODERM) 5 % Place 1 patch onto the skin daily. Remove & Discard patch within 12 hours or as directed by MD  . metFORMIN (GLUCOPHAGE) 500 MG tablet Take 1 tablet (500 mg total) by mouth 2 (two) times daily with a meal.  . mometasone (NASONEX) 50 MCG/ACT nasal spray Place 2 sprays into the nose daily.  . polyvinyl alcohol (LIQUIFILM TEARS) 1.4 % ophthalmic solution Place 1 drop into both eyes as needed for dry eyes.  . rosuvastatin (CRESTOR) 40 MG tablet Take 1 tablet (40 mg total) by mouth daily.  . valACYclovir (VALTREX) 500 MG tablet Take 500 mg by mouth daily. Started on 01-28-20 DS 30   No facility-administered encounter medications on file as of 04/10/2020.     Goals Addressed            This Visit's  Progress   . Collaborate with RN Care Manager to perform appropriate assessments to determine care management and care coordination needs       CARE PLAN ENTRY (see longitudinal plan of care for additional care plan information)  Current Barriers:  . Recent inpatient stay in the last 30 days due to CVA . Inability to monitor BS and BP due to lack of equipment . Knowledge barriers related to management of HTN and prediabetes  Social Work Clinical Goal(s):  Marland Kitchen Over the next 30 days the patient will follow up with primary care provider to obtain desired supplies as directed by SW . Over the next 90 days the patient will work with care management team to address barriers related to management of HTN  CCM SW Interventions: Completed 04/10/20  . Successful outbound call placed to the patient to assess status of goal progression . Determined the patient has yet yo receive requested equipment o Advised patient SW would reach out to her provider regarding status of orders . Collaboration with patient primary care provider, [name] FNP to request follow up on blood pressure cuff and glucometer and testing strips . Scheduled follow up call to the patient over the next month  Patient Self Care Activities:  . Patient verbalizes understanding of plan to work with care management team regarding ongoing care coordination needs . Self administers medications as prescribed . Attends all scheduled provider appointments .  Calls provider office for new concerns or questions  Please see past updates related to this goal by clicking on the "Past Updates" button in the selected goal          Follow Up Plan: SW will follow up with patient by phone over the next month.   Daneen Schick, BSW, CDP Social Worker, Certified Dementia Practitioner [place] [facility] [place] [facility] [phone]  Total time spent performing care coordination and/or care management activities with the patient by phone or face to  face = 12 minutes.

## 2020-04-10 NOTE — Patient Instructions (Signed)
Social Worker Visit Information  Goals we discussed today:  Goals Addressed            This Visit's Progress   . Collaborate with RN Care Manager to perform appropriate assessments to determine care management and care coordination needs       CARE PLAN ENTRY (see longitudinal plan of care for additional care plan information)  Current Barriers:  . Recent inpatient stay in the last 30 days due to CVA . Inability to monitor BS and BP due to lack of equipment . Knowledge barriers related to management of HTN and prediabetes  Social Work Clinical Goal(s):  Marland Kitchen Over the next 30 days the patient will follow up with primary care provider to obtain desired supplies as directed by SW . Over the next 90 days the patient will work with care management team to address barriers related to management of HTN  CCM SW Interventions: Completed 04/10/20  . Successful outbound call placed to the patient to assess status of goal progression . Determined the patient has yet yo receive requested equipment o Advised patient SW would reach out to her provider regarding status of orders . Collaboration with patient primary care provider, [name] FNP to request follow up on blood pressure cuff and glucometer and testing strips . Scheduled follow up call to the patient over the next month  Patient Self Care Activities:  . Patient verbalizes understanding of plan to work with care management team regarding ongoing care coordination needs . Self administers medications as prescribed . Attends all scheduled provider appointments . Calls provider office for new concerns or questions  Please see past updates related to this goal by clicking on the "Past Updates" button in the selected goal          Follow Up Plan: SW will follow up with patient by phone over the next month.   Daneen Schick, BSW, CDP Social Worker, Certified Dementia Practitioner [place] [facility] [place] [facility] [phone]

## 2020-04-11 ENCOUNTER — Ambulatory Visit: Payer: Medicare Other

## 2020-04-11 ENCOUNTER — Encounter: Payer: Self-pay | Admitting: Adult Health

## 2020-04-11 ENCOUNTER — Ambulatory Visit (INDEPENDENT_AMBULATORY_CARE_PROVIDER_SITE_OTHER): Payer: Medicare Other | Admitting: Adult Health

## 2020-04-11 ENCOUNTER — Other Ambulatory Visit: Payer: Self-pay

## 2020-04-11 VITALS — BP 118/74 | HR 56 | Ht 67.0 in | Wt 222.0 lb

## 2020-04-11 DIAGNOSIS — I639 Cerebral infarction, unspecified: Secondary | ICD-10-CM

## 2020-04-11 DIAGNOSIS — I10 Essential (primary) hypertension: Secondary | ICD-10-CM | POA: Diagnosis not present

## 2020-04-11 DIAGNOSIS — M6281 Muscle weakness (generalized): Secondary | ICD-10-CM | POA: Diagnosis not present

## 2020-04-11 DIAGNOSIS — E785 Hyperlipidemia, unspecified: Secondary | ICD-10-CM | POA: Diagnosis not present

## 2020-04-11 DIAGNOSIS — I6381 Other cerebral infarction due to occlusion or stenosis of small artery: Secondary | ICD-10-CM

## 2020-04-11 DIAGNOSIS — R2689 Other abnormalities of gait and mobility: Secondary | ICD-10-CM | POA: Diagnosis not present

## 2020-04-11 DIAGNOSIS — E1159 Type 2 diabetes mellitus with other circulatory complications: Secondary | ICD-10-CM | POA: Diagnosis not present

## 2020-04-11 DIAGNOSIS — I69351 Hemiplegia and hemiparesis following cerebral infarction affecting right dominant side: Secondary | ICD-10-CM | POA: Diagnosis not present

## 2020-04-11 DIAGNOSIS — G8929 Other chronic pain: Secondary | ICD-10-CM | POA: Diagnosis not present

## 2020-04-11 DIAGNOSIS — M25512 Pain in left shoulder: Secondary | ICD-10-CM | POA: Diagnosis not present

## 2020-04-11 DIAGNOSIS — M25511 Pain in right shoulder: Secondary | ICD-10-CM | POA: Diagnosis not present

## 2020-04-11 NOTE — Patient Instructions (Signed)
Continue to work with outpatient therapy for ongoing improvement of residual stroke symptoms  Continue aspirin 81 mg daily  and Crestor 40 mg daily for secondary stroke prevention  We will check cholesterol levels today to ensure satisfactory levels   Continue to follow up with PCP regarding cholesterol, diabetes and blood pressure management   Continue to monitor blood pressure at home  Maintain strict control of hypertension with blood pressure goal below 130/90, diabetes with hemoglobin A1c goal below 6.5% and cholesterol with LDL cholesterol (bad cholesterol) goal below 70 mg/dL. I also advised the patient to eat a healthy diet with plenty of whole grains, cereals, fruits and vegetables, exercise regularly and maintain ideal body weight.  Followup in the future with me in 3 months or call earlier if needed       Thank you for coming to see [place] at [facility]. I hope we have been able to provide you high quality care today.  You may receive a patient satisfaction survey over the next few weeks. We would appreciate your feedback and comments so that we may continue to improve ourselves and the health of our patients.

## 2020-04-11 NOTE — Progress Notes (Signed)
[facility] [address] [place]. [place] [address] [phone]       [place] Amma Crear Date of Birth:  01-Dec-1960 Medical Record Number:  [id number]   Reason for Referral:  hospital stroke follow up    SUBJECTIVE:   CHIEF COMPLAINT:  Chief Complaint  Patient presents with  . Follow-up    hospital fu, rm 9, alone, pt states PT is going well, reports some confusion and weakness on right side of body    HPI:   Margaret Webster is a 59 y.o. female with history of HTN, DM, hypothyroidism, cervical spinals fusion (2012)  who presented on 02/26/2020 with intermittent right side numbness since 02/24/20.  Stroke work-up revealed left thalamic infarct secondary to small vessel disease source.  CTA head/neck showed proximal left ICA mild arthrosclerosis and 2 mm right periophthalmic aneurysm.  Recommended DAPT for 3 weeks and aspirin alone.  History of HTN with initiation of amlodipine.  LDL 249 and initiated Crestor 40 mg daily.  Controlled DM with A1c 5.9 with initiation of Metformin.  Other stroke risk factors include current tobacco use, EtOH use, UDS THC positive and obesity.  No prior stroke history.  Other active problems include CKD stage IIIa.  Residual deficits of slight decrease right-sided sensation  Stroke:   L thalamic infarct secondary to small vessel disease source  MRI  L thalamic infarct. Small vessel disease.   CT head negative   CTA head & neck no LVO. Proximal L ICA mild atherosclerosis. 4mm R periophthalmic aneurysm   2D Echo EF 60-65%. No source of embolus   LDL 249 -initiated Crestor 40 mg daily  HgbA1c 5.9  Lovenox 40 mg sq daily for VTE prophylaxis  No antithrombotics prior to admission, now on ASA 81 and clopidogrel 75 mg daily. Recommend DAPT for 3 weeks and then ASA alone.  Therapy recommendations:  OP PT, no OT/SLP  Disposition:  home  Today, 04/11/2020, Margaret Webster is being seen for hospital  follow-up.  Residual stroke deficits of gait impairment, cognitive impairment with delayed recall and short term memory and right hemiparesis with numbness/tingling. She does report improvement with going participation in outpatient therapies. She does report continued left temporal headaches which have been improving. Completed 3 weeks DAPT and continues on aspirin alone without bleeding or bruising.  Continues on Crestor 40 mg daily without myalgias.  Blood pressure today 118/74.  No concerns at this time.     ROS:   14 system review of systems performed and negative with exception of weakness, short-term memory, numbness/tingling  PMH:  Past Medical History:  Diagnosis Date  . Arthritis    bil shoulders  . Bronchitis   . Chronic back pain   . Diabetes mellitus    stopped Mrtformin 1 mo ago, made her "sick"  . Hypertension   . Hypothyroidism   . Stroke ([place])   . Thyroid disease   . Trigger thumb of right hand     PSH:  Past Surgical History:  Procedure Laterality Date  . BACK SURGERY  1993  . CARPAL TUNNEL RELEASE  08/2015  . CARPAL TUNNEL RELEASE Left 09/10/2017   Procedure: CARPAL TUNNEL RELEASE;  Surgeon: Leanora Cover, MD;  Location: [place];  Service: Orthopedics;  Laterality: Left;  . CERVICAL SPINE SURGERY Right 11/2012  . OOPHORECTOMY    . TRIGGER FINGER RELEASE Right 08/06/2017   Procedure: RIGHT THUMB TRIGGER RELEASE;  Surgeon: Leanora Cover, MD;  Location: [place];  Service: Orthopedics;  Laterality: Right;    Social History:  Social History   Socioeconomic History  . Marital status: Single    Spouse name: Not on file  . Number of children: 1  . Years of education: Not on file  . Highest education level: Not on file  Occupational History  . Occupation: DISABLED    Employer: DISABLED  Tobacco Use  . Smoking status: Current Every Day Smoker    Packs/day: 0.25    Types: Cigarettes  . Smokeless tobacco: Never Used  .  Tobacco comment: 4 cig a day, not interested in chantix  Vaping Use  . Vaping Use: Never used  Substance and Sexual Activity  . Alcohol use: Yes    Comment: occas  . Drug use: Yes    Types: Marijuana  . Sexual activity: Yes    Partners: Male    Birth control/protection: None  Other Topics Concern  . Not on file  Social History Narrative  . Not on file   Social Determinants of Health   Financial Resource Strain: Low Risk   . Difficulty of Paying Living Expenses: Not hard at all  Food Insecurity: No Food Insecurity  . Worried About Charity fundraiser in the Last Year: Never true  . Ran Out of Food in the Last Year: Never true  Transportation Needs: No Transportation Needs  . Lack of Transportation (Medical): No  . Lack of Transportation (Non-Medical): No  Physical Activity: Inactive  . Days of Exercise per Week: 0 days  . Minutes of Exercise per Session: 0 min  Stress: No Stress Concern Present  . Feeling of Stress : Not at all  Social Connections:   . Frequency of Communication with Friends and Family:   . Frequency of Social Gatherings with Friends and Family:   . Attends Religious Services:   . Active Member of Clubs or Organizations:   . Attends Archivist Meetings:   Marland Kitchen Marital Status:   Intimate Partner Violence:   . Fear of Current or Ex-Partner:   . Emotionally Abused:   Marland Kitchen Physically Abused:   . Sexually Abused:     Family History:  Family History  Problem Relation Age of Onset  . Diabetes Mother   . Cystic fibrosis Sister   . Cancer Sister        ?  Marland Kitchen Heart disease Maternal Grandmother   . Clotting disorder Maternal Grandfather     Medications:   Current Outpatient Medications on File Prior to Visit  Medication Sig Dispense Refill  . acetaminophen (TYLENOL) 500 MG tablet Take 1,000 mg by mouth every 6 (six) hours as needed for mild pain.    Marland Kitchen amLODipine (NORVASC) 2.5 MG tablet Take 1 tablet (2.5 mg total) by mouth daily. 30 tablet 11  .  aspirin EC 81 MG tablet Take 81 mg by mouth once.    Marland Kitchen buPROPion (WELLBUTRIN XL) 150 MG 24 hr tablet Take 1 tablet (150 mg total) by mouth every morning. (Patient taking differently: Take 150 mg by mouth every morning. As needed) 30 tablet 2  . diclofenac Sodium (VOLTAREN) 1 % GEL Apply 2 g topically 4 (four) times daily. 100 g 5  . lidocaine (LIDODERM) 5 % Place 1 patch onto the skin daily. Remove & Discard patch within 12 hours or as directed by MD 30 patch 0  . metFORMIN (GLUCOPHAGE) 500 MG tablet Take 1 tablet (500 mg total) by mouth 2 (two)  times daily with a meal. 60 tablet 2  . mometasone (NASONEX) 50 MCG/ACT nasal spray Place 2 sprays into the nose daily. 17 g 2  . polyvinyl alcohol (LIQUIFILM TEARS) 1.4 % ophthalmic solution Place 1 drop into both eyes as needed for dry eyes.    . rosuvastatin (CRESTOR) 40 MG tablet Take 1 tablet (40 mg total) by mouth daily. 30 tablet 2  . valACYclovir (VALTREX) 500 MG tablet Take 500 mg by mouth daily. Started on 01-28-20 DS 30     No current facility-administered medications on file prior to visit.    Allergies:   Allergies  Allergen Reactions  . Sulfa Antibiotics Other (See Comments)    Hard to breathe  . Ibuprofen Other (See Comments)    Stomach upset  . Polytrim [Polymyxin B-Trimethoprim] Itching and Swelling    redness  . Influenza Vaccines Hives  . Lipitor [Atorvastatin Calcium] Other (See Comments)    Muscle ache   . Naproxen Nausea And Vomiting      OBJECTIVE:  Physical Exam  Vitals:   04/11/20 0959  BP: 118/74  Pulse: (!) 56  Weight: 222 lb (100.7 kg)  Height: 5\' 7"  (1.702 m)   Body mass index is 34.77 kg/m. No exam data present   General: well developed, well nourished, pleasant middle-aged African-American female, seated, in no evident distress Head: head normocephalic and atraumatic.   Neck: supple with no carotid or supraclavicular bruits Cardiovascular: regular rate and rhythm, no murmurs Musculoskeletal: no  deformity Skin:  no rash/petichiae Vascular:  Normal pulses all extremities   Neurologic Exam Mental Status: Awake and fully alert.   Fluent speech and language.  Oriented to place and time. Recent memory subjectively impaired and remote memory intact. Attention span, concentration and fund of knowledge appropriate. Mood and affect appropriate.  Cranial Nerves: Fundoscopic exam reveals sharp disc margins. Pupils equal, briskly reactive to light. Extraocular movements full without nystagmus. Visual fields full to confrontation. Hearing intact. Facial sensation intact. Face, tongue, palate moves normally and symmetrically.  Motor: Normal bulk and tone. Normal strength in all tested extremity muscles except mild RLE weakness. Sensory.: intact to touch , pinprick , position and vibratory sensation.  Coordination: Rapid alternating movements normal in all extremities. Finger-to-nose and heel-to-shin performed accurately bilaterally. Gait and Station: Arises from chair without difficulty. Stance is normal. Gait demonstrates normal stride length and balance Reflexes: 1+ and symmetric. Toes downgoing.     NIHSS  0 Modified Rankin  2     ASSESSMENT: Margaret Webster is a 59 y.o. year old female presented with 2-day history of intermittent right-sided numbness on 02/26/2020 with stroke work-up revealing left thalamic infarct secondary to small vessel disease source. Vascular risk factors include HTN, HLD, DM, left ICA mild arthrosclerosis, 50mm R periophthalmic aneurysm, tobacco use, EtOH use and THC use.  Residual deficits of mild RLE weakness, subjective right-sided numbness/tingling, gait impairment and short-term memory impairment     PLAN:  1. Left thalamic stroke: Continue to work with outpatient therapy for ongoing improvement.  Continue aspirin 81 mg daily  and Crestor 40 mg daily for secondary stroke prevention. Maintain strict control of hypertension with blood pressure goal below 130/90,  diabetes with hemoglobin A1c goal below 6.5% and cholesterol with LDL cholesterol (bad cholesterol) goal below 70 mg/dL.  I also advised the patient to eat a healthy diet with plenty of whole grains, cereals, fruits and vegetables, exercise regularly with at least 30 minutes of continuous activity daily and maintain ideal  body weight. 2. HTN: Stable.  Continue to follow with PCP for monitoring and management 3. HLD: Hospital admission LDL 249.  Does not have follow-up with PCP in the near future therefore will repeat lipid panel at today's visit.  Continuation of Crestor 40 mg daily with ongoing PCP follow-up for prescribing, monitoring and management 4. DMII: Hospital admission A1c 5.9.  Continue to follow with PCP for monitoring and management 5. 2 mm R periophthalmic aneurysm: Surveillance monitoring 6 months post hospitalization   Follow up in 3 months or call earlier if needed   CC: [facility] provider Dr. Baron Sane, Doreene Burke, FNP   I spent 45 minutes of face-to-face and non-face-to-face time with patient.  This included previsit chart review, lab review, study review, order entry, electronic health record documentation, patient education regarding recent stroke, residual deficits, importance of managing stroke risk factors and answered all questions to patient satisfaction     Frann Rider, [facility]  [facility] [address] [place], [place] [address]  Phone [phone] Fax [phone] Note: This document was prepared with digital dictation and possible smart phrase technology. Any transcriptional errors that result from this process are unintentional.

## 2020-04-11 NOTE — Therapy (Signed)
[facility] [address] [place], [place], [address] Phone: [phone]   Fax:  [phone]  Physical Therapy Treatment  Patient Details  Name: Margaret Webster MRN: [id number] Date of Birth: Sep 05, 1961 Referring Provider (PT): Referred by Barb Merino but will be following with PCP [name]   Encounter Date: 04/11/2020   PT End of Session - 04/11/20 0849    Visit Number 9    Number of Visits 17    Date for PT Re-Evaluation 87/56/43   90 day cert but 60 day poc   Authorization Type [employer] medicare so 10th visit progress note, FOTO    PT Start Time 0845    PT Stop Time 0932    PT Time Calculation (min) 47 min    Equipment Utilized During Treatment Gait belt   RW, SPC   Activity Tolerance Patient tolerated treatment well    Behavior During Therapy [facility] for tasks assessed/performed           Past Medical History:  Diagnosis Date  . Arthritis    bil shoulders  . Bronchitis   . Chronic back pain   . Diabetes mellitus    stopped Mrtformin 1 mo ago, made her "sick"  . Hypertension   . Hypothyroidism   . Stroke ([place])   . Thyroid disease   . Trigger thumb of right hand     Past Surgical History:  Procedure Laterality Date  . BACK SURGERY  1993  . CARPAL TUNNEL RELEASE  08/2015  . CARPAL TUNNEL RELEASE Left 09/10/2017   Procedure: CARPAL TUNNEL RELEASE;  Surgeon: Leanora Cover, MD;  Location: [place];  Service: Orthopedics;  Laterality: Left;  . CERVICAL SPINE SURGERY Right 11/2012  . OOPHORECTOMY    . TRIGGER FINGER RELEASE Right 08/06/2017   Procedure: RIGHT THUMB TRIGGER RELEASE;  Surgeon: Leanora Cover, MD;  Location: [place];  Service: Orthopedics;  Laterality: Right;    There were no vitals filed for this visit.   Subjective Assessment - 04/11/20 0849    Subjective Pt reports that she is still having some issues on right side in intestines. Feels things are just not  moving as well. Continues to have BM every other day though. She reports that leg is doing better with less soreness. Did go back to work an hour and a half and did well. Mopping was the most difficult.    Pertinent History type 2 diabetes, hypertension, hypothyroidism, smoker, stage II chronic kidney disease    Patient Stated Goals Pt wants to be able to walk better. Also working on quitting smoking.    Currently in Pain? Yes    Pain Score 4     Pain Location Foot    Pain Orientation Right    Pain Descriptors / Indicators Aching    Pain Type Acute pain    Pain Onset 1 to 4 weeks ago    Pain Onset 1 to 4 weeks ago                             [facility] Adult PT Treatment/Exercise - 04/11/20 3295      Ambulation/Gait   Ambulation/Gait Yes    Ambulation/Gait Assistance 5: Supervision    Ambulation/Gait Assistance Details Pt was cued to increase foot clearance over grass. Pt was also cued to use more heel strike. BP=128/84 after gait.     Ambulation Distance (Feet) 850 Feet  Assistive device None    Gait Pattern Step-through pattern;Decreased hip/knee flexion - left    Ambulation Surface Level;Unlevel;Outdoor;Paved;Grass      Neuro Re-ed    Neuro Re-ed Details  Standing on airex feet together: eyes open x 30 sec, eyes closed 30 sec x 2, head turns left/right and up/down x 10 each. Pt had increased sway with head turns and eyes closed.      Exercises   Exercises Other Exercises    Other Exercises  Standing strengthening in // bars with 3# ankle weight on RLE: sidestepping 6' x 6, marching without UE support 6' x 6, right hamstring curls 10 x 2 with tactile and verbal cues to not let right hip flex, step-ups on 6" step forward x 10 and lateral x 10 without UE support. Supine modified thomas stretch on right 30 sec x 2. Pt was given verbal cues for form throughout.                    PT Short Term Goals - 04/11/20 1158      PT SHORT TERM GOAL #1   Title Pt will  be independent with initial HEP for strengthening and balance to continue at home.    Baseline Pt has been performing initial HEP as instructed    Time 4    Period Weeks    Status Achieved    Target Date 04/07/20      PT SHORT TERM GOAL #2   Title Pt will increase 30 sec sit to stand from 2 reps to 5 or more for improved functional strength.    Baseline 2 reps from standard chair with heavy UE support 03/08/20, 8 sit to stands with hands in lap    Time 4    Period Weeks    Status Achieved    Target Date 04/07/20      PT SHORT TERM GOAL #3   Title Pt will decrease TUG from 62 sec to <40 sec for improved balance and functional mobility.    Baseline 62 sec without AD on 03/08/20, 03/29/20 8 sec without AD    Time 4    Period Weeks    Status Achieved    Target Date 04/07/20      PT SHORT TERM GOAL #4   Title Pt will ambulate >300' with LRAD mod I on level surfaces for improved household and short community distances.    Baseline 850' on varied surfaces supervision. Is mod I on level surfaces    Time 4    Period Weeks    Status Achieved    Target Date 04/07/20             PT Long Term Goals - 03/29/20 1323      PT LONG TERM GOAL #1   Title Pt will be independent with progressive HEP for strengthening, balance and aerobic exercise to continue gains on own.    Time 8    Period Weeks    Status New      PT LONG TERM GOAL #2   Title Pt will increase Berg Balance score by 6 points for improved balance and decreased fall risk.    Baseline 03/12/20 47/56    Time 8    Period Weeks    Status New      PT LONG TERM GOAL #3   Title Pt will ambulate up/down 4 stairs with reciprocal pattern without railing for improved functional strength.    Time 8  Period Weeks    Status New      PT LONG TERM GOAL #4   Title Pt will ambulate >500' on varied surfaces without AD independently for improved community mobility.    Time 8    Period Weeks    Status New      PT LONG TERM GOAL #5    Title Pt will increase gait speed by >0.85m/s for improved gait safety.    Baseline 03/12/20 gait speed=0.46m/s with RW    Time 8    Period Weeks    Status New      Additional Long Term Goals   Additional Long Term Goals Yes      PT LONG TERM GOAL #6   Title Pt will increase FGA from 25/30 to 27 or more for improved balance and gait safety.    Baseline 25/30 on 03/29/20    Time 8    Period Weeks    Status New    Target Date 05/07/20                 Plan - 04/11/20 1159    Clinical Impression Statement Pt continues to progress well with all activities. Increased gait distance today with slight increased weakness noted on right towards end. PT focused on strengthening of right hip more during session today with adding weights.    Personal Factors and Comorbidities Comorbidity 3+    Comorbidities type 2 diabetes, hypertension, hypothyroidism, smoker, stage II chronic kidney disease    Examination-Activity Limitations Locomotion Level;Stand;Stairs;Transfers    Examination-Participation Restrictions Community Activity;Yard Work;Shop   work   Advice worker Good    PT Frequency 2x / week    PT Duration 8 weeks    PT Treatment/Interventions ADLs/Self Care Home Management;Cryotherapy;Moist Heat;DME Instruction;Gait training;Stair training;Functional mobility training;Neuromuscular re-education;Balance training;Therapeutic exercise;Therapeutic activities;Patient/family education;Manual techniques;Passive range of motion;Vestibular    PT Next Visit Plan 10th visit progress note next visit. Gait training on varied surfaces without AD. Standing balance progression, right leg strengthening.    Consulted and Agree with Plan of Care Patient           Patient will benefit from skilled therapeutic intervention in order to improve the following deficits and impairments:  Abnormal gait, Decreased activity tolerance, Decreased  balance, Decreased endurance, Decreased knowledge of use of DME, Decreased mobility, Decreased range of motion, Decreased strength, Pain, Impaired sensation, Impaired tone, Impaired UE functional use  Visit Diagnosis: Other abnormalities of gait and mobility  Muscle weakness (generalized)     Problem List Patient Active Problem List   Diagnosis Date Noted  . CVA (cerebral vascular accident) ([place]) 02/26/2020  . CKD (chronic kidney disease) stage 3, GFR 30-59 ml/min   . Bradycardia   . Elevated TSH   . Multiple joint pain 11/07/2019  . Acute pain of right shoulder 09/19/2019  . Right hip pain 09/19/2019  . Prediabetes 09/08/2018  . Chest pain 01/10/2015  . DM (diabetes mellitus) ([place]) 01/10/2015  . Hypertension 01/10/2015  . Hypothyroidism 01/10/2015  . Headache 01/10/2015  . Abdominal pain 01/10/2015  . Vaginitis 04/07/2014  . Pain 04/07/2014  . Vaginal irritation 04/07/2014  . Boil of buttock 04/06/2014  . BV (bacterial vaginosis) 04/06/2014  . Unspecified symptom associated with female genital organs 11/16/2013  . Candidiasis of vulva and vagina 11/16/2013  . Vaginitis and vulvovaginitis, unspecified 06/07/2013  . Pain aggravated by activities of daily living 06/07/2013  . Nonspecific abnormal finding in stool  contents 12/02/2011  . Diabetes mellitus without mention of complication 29/79/8921  . Chronic back pain     Electa Sniff, PT, DPT, NCS 04/11/2020, 12:02 PM  [facility] [address] [place], [place], [address] Phone: [phone]   Fax:  [phone]  Name: Jazzmine Kleiman MRN: [id number] Date of Birth: 1961-03-02

## 2020-04-11 NOTE — Patient Instructions (Signed)
Access Code: [email or web address] URL: [email or web address] Date: 04/11/2020 Prepared by: Cherly Anderson  Exercises Supine Bridge - 1 x daily - 5 x weekly - 2 sets - 10 reps Clamshell - 1 x daily - 5 x weekly - 2 sets - 10 reps Standing Hip Abduction with Counter Support - 1 x daily - 5 x weekly - 2 sets - 10 reps Standing Hip Extension with Counter Support - 1 x daily - 5 x weekly - 2 sets - 10 reps Mini Squat with Counter Support - 1 x daily - 5 x weekly - 2 sets - 10 reps Standing Gastroc Stretch at Counter - 2 x daily - 7 x weekly - 1 sets - 3 reps - 30 sec hold Tandem Stance - 2 x daily - 7 x weekly - 1 sets - 3 reps - 20-30 sec hold Standing in corner eyes open and eyes closed - 2 x daily - 7 x weekly - 1 sets - 3 reps - 20-30sec hold Supine Lower Trunk Rotation - 1 x daily - 7 x weekly - 10 reps - 3 sets Cross Legged Seat with Side Bend - 1 x daily - 7 x weekly - 3 sets - 10 reps Standing Alternating Knee Flexion - 1 x daily - 7 x weekly - 10 reps - 3 sets Supine Hip Flexor Stretch with Weight - 1 x daily - 7 x weekly - 2 sets - 3 reps - 30 sec hold

## 2020-04-12 LAB — LIPID PANEL
Chol/HDL Ratio: 3.2 ratio (ref 0.0–4.4)
Cholesterol, Total: 159 mg/dL (ref 100–199)
HDL: 49 mg/dL (ref >39)
LDL Chol Calc (NIH): 86 mg/dL (ref 0–99)
Triglycerides: 138 mg/dL (ref 0–149)
VLDL Cholesterol Cal: 24 mg/dL (ref 5–40)

## 2020-04-13 NOTE — Progress Notes (Signed)
I agree with the above plan 

## 2020-04-16 ENCOUNTER — Other Ambulatory Visit: Payer: Self-pay

## 2020-04-16 ENCOUNTER — Ambulatory Visit: Payer: Medicare Other | Admitting: Occupational Therapy

## 2020-04-16 ENCOUNTER — Telehealth: Payer: Self-pay

## 2020-04-16 ENCOUNTER — Encounter: Payer: Self-pay | Admitting: Occupational Therapy

## 2020-04-16 ENCOUNTER — Ambulatory Visit: Payer: Medicare Other

## 2020-04-16 DIAGNOSIS — I69351 Hemiplegia and hemiparesis following cerebral infarction affecting right dominant side: Secondary | ICD-10-CM | POA: Diagnosis not present

## 2020-04-16 DIAGNOSIS — M6281 Muscle weakness (generalized): Secondary | ICD-10-CM

## 2020-04-16 DIAGNOSIS — R2689 Other abnormalities of gait and mobility: Secondary | ICD-10-CM

## 2020-04-16 DIAGNOSIS — M25511 Pain in right shoulder: Secondary | ICD-10-CM | POA: Diagnosis not present

## 2020-04-16 DIAGNOSIS — M25512 Pain in left shoulder: Secondary | ICD-10-CM | POA: Diagnosis not present

## 2020-04-16 DIAGNOSIS — G8929 Other chronic pain: Secondary | ICD-10-CM

## 2020-04-16 NOTE — Telephone Encounter (Signed)
-----   Message from Frann Rider, NP sent at 04/12/2020 12:01 PM EDT ----- Please advise patient that recent lipid panel did show excellent improvement of overall levels and decreased LDL or bad cholesterol at 86.  Please advised to continue current regimen as LDL will likely further decrease with more time and ongoing dietary changes.

## 2020-04-16 NOTE — Therapy (Signed)
[facility] [address] [place], [place], [address] Phone: [phone]   Fax:  [phone]  Physical Therapy Treatment/10th visit progress note  Patient Details  Name: Margaret Webster MRN: [id number] Date of Birth: [birth year] Referring Provider (PT): Referred by Barb Merino but will be following with PCP [name]    Progress Note  Reporting period 03/08/20 to 04/16/20  See Note below for Objective Data and Assessment of Progress/Goals   Encounter Date: 04/16/2020   PT End of Session - 04/16/20 0847    Visit Number 10    Number of Visits 17    Date for PT Re-Evaluation 26/37/85   90 day cert but 60 day poc   Authorization Type [employer] medicare so 10th visit progress note, FOTO    PT Start Time 0845    PT Stop Time 0927    PT Time Calculation (min) 42 min    Equipment Utilized During Treatment Gait belt   RW, SPC   Activity Tolerance Patient tolerated treatment well    Behavior During Therapy [facility] for tasks assessed/performed           Past Medical History:  Diagnosis Date  . Arthritis    bil shoulders  . Bronchitis   . Chronic back pain   . Diabetes mellitus    stopped Mrtformin 1 mo ago, made her "sick"  . Hypertension   . Hypothyroidism   . Stroke ([place])   . Thyroid disease   . Trigger thumb of right hand     Past Surgical History:  Procedure Laterality Date  . BACK SURGERY  1993  . CARPAL TUNNEL RELEASE  08/2015  . CARPAL TUNNEL RELEASE Left 09/10/2017   Procedure: CARPAL TUNNEL RELEASE;  Surgeon: Leanora Cover, MD;  Location: [facility];  Service: Orthopedics;  Laterality: Left;  . CERVICAL SPINE SURGERY Right 11/2012  . OOPHORECTOMY    . TRIGGER FINGER RELEASE Right 08/06/2017   Procedure: RIGHT THUMB TRIGGER RELEASE;  Surgeon: Leanora Cover, MD;  Location: [facility];  Service: Orthopedics;  Laterality: Right;    There were no vitals filed for this visit.    Subjective Assessment - 04/16/20 0847    Subjective Pt reports that she is just tired today. Did not do anything really this weekend.    Pertinent History type 2 diabetes, hypertension, hypothyroidism, smoker, stage II chronic kidney disease    Patient Stated Goals Pt wants to be able to walk better. Also working on quitting smoking.    Currently in Pain? Yes    Pain Score 5     Pain Location Hip    Pain Orientation Right    Pain Descriptors / Indicators Aching    Pain Type Acute pain    Pain Onset 1 to 4 weeks ago    Pain Onset 1 to 4 weeks ago                             [facility] Adult PT Treatment/Exercise - 04/16/20 0849      Ambulation/Gait   Ambulation/Gait Yes    Ambulation/Gait Assistance 5: Supervision    Ambulation/Gait Assistance Details Pt able to carry on a conversation throughout. Verbal cues to increase step length.    Ambulation Distance (Feet) 850 Feet    Assistive device None    Gait Pattern Step-through pattern    Ambulation Surface Level;Unlevel;Outdoor;Paved;Grass      Standardized Balance  Assessment   Standardized Balance Assessment Berg Balance Test      Berg Balance Test   Sit to Stand Able to stand without using hands and stabilize independently    Standing Unsupported Able to stand safely 2 minutes    Sitting with Back Unsupported but Feet Supported on Floor or Stool Able to sit safely and securely 2 minutes    Stand to Sit Sits safely with minimal use of hands    Transfers Able to transfer safely, minor use of hands    Standing Unsupported with Eyes Closed Able to stand 10 seconds safely    Standing Ubsupported with Feet Together Able to place feet together independently and stand 1 minute safely    From Standing, Reach Forward with Outstretched Arm Can reach confidently >25 cm (10")    From Standing Position, Pick up Object from Floor Able to pick up shoe safely and easily    From Standing Position, Turn to Look Behind Over each  Shoulder Looks behind from both sides and weight shifts well    Turn 360 Degrees Able to turn 360 degrees safely in 4 seconds or less    Standing Unsupported, Alternately Place Feet on Step/Stool Able to stand independently and safely and complete 8 steps in 20 seconds    Standing Unsupported, One Foot in Front Able to place foot tandem independently and hold 30 seconds    Standing on One Leg Able to lift leg independently and hold equal to or more than 3 seconds   3 sec R and 8 sec L   Total Score 54      Neuro Re-ed    Neuro Re-ed Details  Standing with 1 foot on roller x 30 sec each leg then with rolling back and forth x 20 each leg. Switching to SLS with one leg on soccerball x 30 sec each leg then with rolling ball front/back and side/side x 10 each. CGA for safety with activities. Pt was cued to tighten her bottom to help support stance leg.      Exercises   Exercises Other Exercises    Other Exercises  Standing exercises at // bars with 4# weights on ankles x 10 bilateral: hip extension, hip abduction, hamstring curl and march with verbal cues for form and to go slow.                  PT Education - 04/16/20 0936    Education Details Results of Berg testing. Discussed if she chooses to purchase ankle weight to try to get one with adjustable weight where you can take leads out or add in. Advised [employer] of sporting good stores should have.    Person(s) Educated Patient    Comprehension Verbalized understanding            PT Short Term Goals - 04/11/20 1158      PT SHORT TERM GOAL #1   Title Pt will be independent with initial HEP for strengthening and balance to continue at home.    Baseline Pt has been performing initial HEP as instructed    Time 4    Period Weeks    Status Achieved    Target Date 04/07/20      PT SHORT TERM GOAL #2   Title Pt will increase 30 sec sit to stand from 2 reps to 5 or more for improved functional strength.    Baseline 2 reps from  standard chair with heavy UE support 03/08/20, 8 sit  to stands with hands in lap    Time 4    Period Weeks    Status Achieved    Target Date 04/07/20      PT SHORT TERM GOAL #3   Title Pt will decrease TUG from 62 sec to <40 sec for improved balance and functional mobility.    Baseline 62 sec without AD on 03/08/20, 03/29/20 8 sec without AD    Time 4    Period Weeks    Status Achieved    Target Date 04/07/20      PT SHORT TERM GOAL #4   Title Pt will ambulate >300' with LRAD mod I on level surfaces for improved household and short community distances.    Baseline 850' on varied surfaces supervision. Is mod I on level surfaces    Time 4    Period Weeks    Status Achieved    Target Date 04/07/20             PT Long Term Goals - 04/16/20 0937      PT LONG TERM GOAL #1   Title Pt will be independent with progressive HEP for strengthening, balance and aerobic exercise to continue gains on own.    Time 8    Period Weeks    Status New      PT LONG TERM GOAL #2   Title Pt will increase Berg Balance score by 6 points for improved balance and decreased fall risk.    Baseline 03/12/20 47/56, 04/16/20 54/56 meeting goal.    Time 8    Period Weeks    Status Achieved      PT LONG TERM GOAL #3   Title Pt will ambulate up/down 4 stairs with reciprocal pattern without railing for improved functional strength.    Time 8    Period Weeks    Status New      PT LONG TERM GOAL #4   Title Pt will ambulate >500' on varied surfaces without AD independently for improved community mobility.    Time 8    Period Weeks    Status New      PT LONG TERM GOAL #5   Title Pt will increase gait speed by >0.30m/s for improved gait safety.    Baseline 03/12/20 gait speed=0.60m/s with RW    Time 8    Period Weeks    Status New      PT LONG TERM GOAL #6   Title Pt will increase FGA from 25/30 to 27 or more for improved balance and gait safety.    Baseline 25/30 on 03/29/20    Time 8    Period Weeks     Status New                 Plan - 04/16/20 0102    Clinical Impression Statement PT continued to progress gait on varied surfaces with pt having no LOB today. Reassessed Berg and increased 7 points to 54/56 indicating low fall risk now and meeting LTG early. Pt continues to show progress with strength, balance and gait and will continue to benefit from skilled PT to address deficits.    Personal Factors and Comorbidities Comorbidity 3+    Comorbidities type 2 diabetes, hypertension, hypothyroidism, smoker, stage II chronic kidney disease    Examination-Activity Limitations Locomotion Level;Stand;Stairs;Transfers    Examination-Participation Restrictions Community Activity;Yard Work;Shop   work   Advice worker Good    PT  Frequency 2x / week    PT Duration 8 weeks    PT Treatment/Interventions ADLs/Self Care Home Management;Cryotherapy;Moist Heat;DME Instruction;Gait training;Stair training;Functional mobility training;Neuromuscular re-education;Balance training;Therapeutic exercise;Therapeutic activities;Patient/family education;Manual techniques;Passive range of motion;Vestibular    PT Next Visit Plan Gait training on varied surfaces without AD. Standing balance progression, right leg strengthening.    Consulted and Agree with Plan of Care Patient           Patient will benefit from skilled therapeutic intervention in order to improve the following deficits and impairments:  Abnormal gait, Decreased activity tolerance, Decreased balance, Decreased endurance, Decreased knowledge of use of DME, Decreased mobility, Decreased range of motion, Decreased strength, Pain, Impaired sensation, Impaired tone, Impaired UE functional use  Visit Diagnosis: Other abnormalities of gait and mobility  Hemiplegia and hemiparesis following cerebral infarction affecting right dominant side (HCC)  Muscle weakness  (generalized)     Problem List Patient Active Problem List   Diagnosis Date Noted  . CVA (cerebral vascular accident) ([place]) 02/26/2020  . CKD (chronic kidney disease) stage 3, GFR 30-59 ml/min   . Bradycardia   . Elevated TSH   . Multiple joint pain 11/07/2019  . Acute pain of right shoulder 09/19/2019  . Right hip pain 09/19/2019  . Prediabetes 09/08/2018  . Chest pain 01/10/2015  . DM (diabetes mellitus) ([place]) 01/10/2015  . Hypertension 01/10/2015  . Hypothyroidism 01/10/2015  . Headache 01/10/2015  . Abdominal pain 01/10/2015  . Vaginitis 04/07/2014  . Pain 04/07/2014  . Vaginal irritation 04/07/2014  . Boil of buttock 04/06/2014  . BV (bacterial vaginosis) 04/06/2014  . Unspecified symptom associated with female genital organs 11/16/2013  . Candidiasis of vulva and vagina 11/16/2013  . Vaginitis and vulvovaginitis, unspecified 06/07/2013  . Pain aggravated by activities of daily living 06/07/2013  . Nonspecific abnormal finding in stool contents 12/02/2011  . Diabetes mellitus without mention of complication 29/56/2130  . Chronic back pain     Electa Sniff, PT, DPT, NCS 04/16/2020, 9:40 AM  [facility] [address] [place], [place], [address] Phone: [phone]   Fax:  [phone]  Name: Margaret Webster MRN: [id number] Date of Birth: Jun 14, 1961

## 2020-04-16 NOTE — Therapy (Signed)
[facility] [address] [place], [place], [address] Phone: [phone]   Fax:  [phone]  Occupational Therapy Evaluation  Patient Details  Name: Margaret Webster MRN: [id number] Date of Birth: [birth year] Referring Provider (OT): [name], FNP   Encounter Date: 04/16/2020   OT End of Session - 04/16/20 1006    Visit Number 1    Number of Visits 1    Date for OT Re-Evaluation --   n/a   Authorization Type medicare    OT Start Time 0932    OT Stop Time 0955    OT Time Calculation (min) 23 min    Behavior During Therapy [facility] for tasks assessed/performed           Past Medical History:  Diagnosis Date  . Arthritis    bil shoulders  . Bronchitis   . Chronic back pain   . Diabetes mellitus    stopped Mrtformin 1 mo ago, made her "sick"  . Hypertension   . Hypothyroidism   . Stroke ([place])   . Thyroid disease   . Trigger thumb of right hand     Past Surgical History:  Procedure Laterality Date  . BACK SURGERY  1993  . CARPAL TUNNEL RELEASE  08/2015  . CARPAL TUNNEL RELEASE Left 09/10/2017   Procedure: CARPAL TUNNEL RELEASE;  Surgeon: Leanora Cover, MD;  Location: [facility];  Service: Orthopedics;  Laterality: Left;  . CERVICAL SPINE SURGERY Right 11/2012  . OOPHORECTOMY    . TRIGGER FINGER RELEASE Right 08/06/2017   Procedure: RIGHT THUMB TRIGGER RELEASE;  Surgeon: Leanora Cover, MD;  Location: [facility];  Service: Orthopedics;  Laterality: Right;    There were no vitals filed for this visit.   Subjective Assessment - 04/16/20 0935    Currently in Pain? Yes    Pain Score 4     Pain Location Hip    Pain Orientation Right    Pain Descriptors / Indicators Aching    Pain Type Chronic pain   hip [phone] due to back injury   Pain Onset 1 to 4 weeks ago    Aggravating Factors  with shoe on    Pain Relieving Factors not haiivng shoe on, pressure on it    Multiple Pain  Sites Yes    Pain Score 7    Pain Location Shoulder    Pain Orientation Right;Left    Pain Descriptors / Indicators Sharp;Aching    Pain Type Chronic pain   prior to 1993 due falls from back issue/MD reports spurs in both shoulders.  MD recommeded shoulder replacement or debridment for both shoulders and pt declined.   Pain Onset More than a month ago   see above   Pain Frequency Constant    Aggravating Factors  laying on it, cold    Pain Relieving Factors tylenol, heat,             [facility] OT Assessment - 04/16/20 0001      Assessment   Medical Diagnosis L thalmic CVA    Referring Provider (OT) [name], FNP    Onset Date/Surgical Date 02/26/20   date of stroke   Hand Dominance Right    Prior Therapy pt has not had OT for her shoulders but has had PT and reports dry needling has helped.       Precautions   Precautions Fall   seeing PT currently     Restrictions   Weight Bearing  Restrictions No      Balance Screen   Has the patient fallen in the past 6 months No      Home  Environment   Family/patient expects to be discharged to: Private residence      Prior Function   Level of Independence Independent      ADL   Eating/Feeding Independent    Grooming Independent    Upper Body Bathing Independent    Lower Body Bathing Independent    Upper Body Dressing Independent    Lower Body Dressing Independent    Banker Independent      IADL   Shopping Takes care of all shopping needs independently    [place] alone or with occasional assistance    Meal Prep Plans, prepares and serves adequate meals independently    [place] own vehicle    Medication Management Is responsible for taking medication in correct dosages at correct time    Physiological scientist financial matters independently (budgets, writes checks, pays rent, bills goes to bank), collects and keeps track of income        Written Expression   Dominant Hand Right      Vision - History   Additional Comments Pt reports some L field vision loss initially but now feels like vision is returning to baseline.       Cognition   Overall Cognitive Status Within Functional Limits for tasks assessed      Sensation   Light Touch Appears Intact    Hot/Cold Appears Intact      Coordination   Gross Motor Movements are Fluid and Coordinated No   due to bilateral shoulder pain   Fine Motor Movements are Fluid and Coordinated Yes      ROM / Strength   AROM / PROM / Strength AROM;Strength      AROM   Overall AROM  Deficits    Overall AROM Comments BUE's:  shoulder flexion to approximately 110* abduction to approximately 90* then pt experiences pain.  All other AROM WFL's.       Strength   Overall Strength Comments BUE's -unable to MMT shoulder flexion/abduction due to signficant and long standing pain in both shoulders. Pt reports iniitally pain was a little worse right after the stroke "I think because I was just laying there alot" but now reports it is back to baseline - pain has been long standing - see note for more details. Pt has sufficient strength in BUE's to complete ADL and IADL tasks with some help for heavier housework (son assists due to mostly to balance and pain in BUE's - son has been assisting for considerable time).  Distal strength (elbows to hands) WFL's.       Hand Function   Right Hand Gross Grasp Functional    Right Hand Grip (lbs) 55    Left Hand Gross Grasp Functional    Left Hand Grip (lbs) 55                                OT Long Term Goals - 04/16/20 0959      OT LONG TERM GOAL #1   Title n/a                 Plan - 04/16/20 0959    Clinical Impression Statement Pt is a 59 year old female s/p L  thalamic stroke on 02/26/20 - discharged home on 02/28/20 with assistance prn from family.  PMH:  DM, HTN, hypothyroidism, CKD stage II, h/o of signficant back  issues, smoker.  Pt was referred primarily for B shoulder pain. Pt reports initially pain was worse after stroke but she feels it was from laying in bed so much. Pt reports shoulder pain is now back to baseline of prior to stroke (6/7 out of 10 with certain movements - see eval). Pt has had bilateral shoulder pain since 1993 due to back issue and falls she was experiencing at that time. Pt is no longer falling. Pt reports MD did imaging and stated pt has bone spurs and would benfit from either shoulder replacement or "cleaning out " of spurs - pt has refused all surgery. Pt reports in the past she had dry needling and this "really helped me." Given orthopedic nature of pain in both shoulders and pt's history of improvement in pain with dry needling, feel pt would benefit from continued PT with ortho PT to address shoulder issues once she has completed neuro PT for stroke. Pt in agreement with recommedation - will communicate this with current PT so that current PT can facilitate this. No further OT indicated at this time.    OT Occupational Profile and History Detailed Assessment- Review of Records and additional review of physical, cognitive, psychosocial history related to current functional performance    Plan n/a no further OT indicated at this time.    Consulted and Agree with Plan of Care Patient           Patient will benefit from skilled therapeutic intervention in order to improve the following deficits and impairments:           Visit Diagnosis: Chronic right shoulder pain  Chronic left shoulder pain    Problem List Patient Active Problem List   Diagnosis Date Noted  . CVA (cerebral vascular accident) ([place]) 02/26/2020  . CKD (chronic kidney disease) stage 3, GFR 30-59 ml/min   . Bradycardia   . Elevated TSH   . Multiple joint pain 11/07/2019  . Acute pain of right shoulder 09/19/2019  . Right hip pain 09/19/2019  . Prediabetes 09/08/2018  . Chest pain 01/10/2015  . DM  (diabetes mellitus) ([place]) 01/10/2015  . Hypertension 01/10/2015  . Hypothyroidism 01/10/2015  . Headache 01/10/2015  . Abdominal pain 01/10/2015  . Vaginitis 04/07/2014  . Pain 04/07/2014  . Vaginal irritation 04/07/2014  . Boil of buttock 04/06/2014  . BV (bacterial vaginosis) 04/06/2014  . Unspecified symptom associated with female genital organs 11/16/2013  . Candidiasis of vulva and vagina 11/16/2013  . Vaginitis and vulvovaginitis, unspecified 06/07/2013  . Pain aggravated by activities of daily living 06/07/2013  . Nonspecific abnormal finding in stool contents 12/02/2011  . Diabetes mellitus without mention of complication 86/57/8469  . Chronic back pain     Quay Burow, OTR/L 04/16/2020, 10:08 AM  [facility] [address] [place], [place], [address] Phone: [phone]   Fax:  [phone]  Name: Margaret Webster MRN: [id number] Date of Birth: August 22, 1961

## 2020-04-16 NOTE — Telephone Encounter (Signed)
Pt was contacted and notified of the message below. Pt was happy and had no additional questions

## 2020-04-16 NOTE — Telephone Encounter (Signed)
LM on the Vm for the pt to call back re: documentation below- lab results

## 2020-04-19 ENCOUNTER — Other Ambulatory Visit: Payer: Self-pay

## 2020-04-19 ENCOUNTER — Ambulatory Visit: Payer: Medicare Other

## 2020-04-19 VITALS — BP 132/86

## 2020-04-19 DIAGNOSIS — M6281 Muscle weakness (generalized): Secondary | ICD-10-CM | POA: Diagnosis not present

## 2020-04-19 DIAGNOSIS — R2689 Other abnormalities of gait and mobility: Secondary | ICD-10-CM | POA: Diagnosis not present

## 2020-04-19 DIAGNOSIS — I69351 Hemiplegia and hemiparesis following cerebral infarction affecting right dominant side: Secondary | ICD-10-CM | POA: Diagnosis not present

## 2020-04-19 DIAGNOSIS — G8929 Other chronic pain: Secondary | ICD-10-CM | POA: Diagnosis not present

## 2020-04-19 DIAGNOSIS — M25511 Pain in right shoulder: Secondary | ICD-10-CM | POA: Diagnosis not present

## 2020-04-19 DIAGNOSIS — M25512 Pain in left shoulder: Secondary | ICD-10-CM | POA: Diagnosis not present

## 2020-04-19 NOTE — Therapy (Signed)
[facility] [address] [place], [place], [address] Phone: [phone]   Fax:  [phone]  Physical Therapy Treatment  Patient Details  Name: Margaret Webster MRN: [id number] Date of Birth: 1960-12-09 Referring Provider (PT): Referred by Barb Merino but will be following with PCP [name]   Encounter Date: 04/19/2020   PT End of Session - 04/19/20 0851    Visit Number 11    Number of Visits 17    Date for PT Re-Evaluation 76/16/07   90 day cert but 60 day poc   Authorization Type [employer] medicare so 10th visit progress note, FOTO    PT Start Time 0849    PT Stop Time 0927    PT Time Calculation (min) 38 min    Equipment Utilized During Treatment Gait belt   RW, SPC   Activity Tolerance Patient tolerated treatment well    Behavior During Therapy [facility] for tasks assessed/performed           Past Medical History:  Diagnosis Date  . Arthritis    bil shoulders  . Bronchitis   . Chronic back pain   . Diabetes mellitus    stopped Mrtformin 1 mo ago, made her "sick"  . Hypertension   . Hypothyroidism   . Stroke ([place])   . Thyroid disease   . Trigger thumb of right hand     Past Surgical History:  Procedure Laterality Date  . BACK SURGERY  1993  . CARPAL TUNNEL RELEASE  08/2015  . CARPAL TUNNEL RELEASE Left 09/10/2017   Procedure: CARPAL TUNNEL RELEASE;  Surgeon: Leanora Cover, MD;  Location: [facility];  Service: Orthopedics;  Laterality: Left;  . CERVICAL SPINE SURGERY Right 11/2012  . OOPHORECTOMY    . TRIGGER FINGER RELEASE Right 08/06/2017   Procedure: RIGHT THUMB TRIGGER RELEASE;  Surgeon: Leanora Cover, MD;  Location: [facility];  Service: Orthopedics;  Laterality: Right;    Vitals:   04/19/20 0851  BP: 132/86     Subjective Assessment - 04/19/20 0851    Subjective Pt reports that she has a little headache this morning. She also stubbed her broken toe yesterday so  was hurting more but feels ok today.    Pertinent History type 2 diabetes, hypertension, hypothyroidism, smoker, stage II chronic kidney disease    Patient Stated Goals Pt wants to be able to walk better. Also working on quitting smoking.    Currently in Pain? Yes    Pain Score --   mild headache   Pain Location Head    Pain Descriptors / Indicators Aching    Pain Type Acute pain    Pain Onset 1 to 4 weeks ago    Pain Onset 1 to 4 weeks ago                             [facility] Adult PT Treatment/Exercise - 04/19/20 0854      Ambulation/Gait   Ambulation/Gait Yes    Ambulation/Gait Assistance 5: Supervision    Ambulation/Gait Assistance Details Pt was cued to tighten gluts on right during stance to try to help decrease right lateral lean. Denied any increased pain in foot.    Ambulation Distance (Feet) 1000 Feet    Assistive device None    Gait Pattern Step-through pattern;Trendelenburg    Ambulation Surface Level;Unlevel;Outdoor;Paved;Grass;Other (comment)   mulch   Curb 5: Supervision    Curb Details (  indicate cue type and reason) x 2      Neuro Re-ed    Neuro Re-ed Details  By // bars on blue mat: tandem gait 6' x 8 with CGA for safety. Standing staggered stance x 30 sec each position then with adding in head turns left/right x 10 each position.       Exercises   Exercises Other Exercises    Other Exercises  Standing bottom step facing sideways with mirror in front lowering down and tapping left foot on ground and raising back up to get pelvis level x 10. Pt reported really feeling it in the hip. Side stepping with red theraband around thighs with mini-squat with hand held assist 20' x 2. Verbal and tactile cues to keep hips straight and not rotate more to the right to shift left.                   PT Education - 04/19/20 0926    Education Details Pt instructed to add red theraband to side stepping    Person(s) Educated Patient    Methods Explanation     Comprehension Verbalized understanding            PT Short Term Goals - 04/11/20 1158      PT SHORT TERM GOAL #1   Title Pt will be independent with initial HEP for strengthening and balance to continue at home.    Baseline Pt has been performing initial HEP as instructed    Time 4    Period Weeks    Status Achieved    Target Date 04/07/20      PT SHORT TERM GOAL #2   Title Pt will increase 30 sec sit to stand from 2 reps to 5 or more for improved functional strength.    Baseline 2 reps from standard chair with heavy UE support 03/08/20, 8 sit to stands with hands in lap    Time 4    Period Weeks    Status Achieved    Target Date 04/07/20      PT SHORT TERM GOAL #3   Title Pt will decrease TUG from 62 sec to <40 sec for improved balance and functional mobility.    Baseline 62 sec without AD on 03/08/20, 03/29/20 8 sec without AD    Time 4    Period Weeks    Status Achieved    Target Date 04/07/20      PT SHORT TERM GOAL #4   Title Pt will ambulate >300' with LRAD mod I on level surfaces for improved household and short community distances.    Baseline 850' on varied surfaces supervision. Is mod I on level surfaces    Time 4    Period Weeks    Status Achieved    Target Date 04/07/20             PT Long Term Goals - 04/16/20 0937      PT LONG TERM GOAL #1   Title Pt will be independent with progressive HEP for strengthening, balance and aerobic exercise to continue gains on own.    Time 8    Period Weeks    Status New      PT LONG TERM GOAL #2   Title Pt will increase Berg Balance score by 6 points for improved balance and decreased fall risk.    Baseline 03/12/20 47/56, 04/16/20 54/56 meeting goal.    Time 8    Period Weeks  Status Achieved      PT LONG TERM GOAL #3   Title Pt will ambulate up/down 4 stairs with reciprocal pattern without railing for improved functional strength.    Time 8    Period Weeks    Status New      PT LONG TERM GOAL #4   Title Pt  will ambulate >500' on varied surfaces without AD independently for improved community mobility.    Time 8    Period Weeks    Status New      PT LONG TERM GOAL #5   Title Pt will increase gait speed by >0.45m/s for improved gait safety.    Baseline 03/12/20 gait speed=0.87m/s with RW    Time 8    Period Weeks    Status New      PT LONG TERM GOAL #6   Title Pt will increase FGA from 25/30 to 27 or more for improved balance and gait safety.    Baseline 25/30 on 03/29/20    Time 8    Period Weeks    Status New                 Plan - 04/19/20 0932    Clinical Impression Statement Pt continues to show progress with gait stability on varied surfaces. She needed cues for proper form with exercises trying to get more right hip activation.    Personal Factors and Comorbidities Comorbidity 3+    Comorbidities type 2 diabetes, hypertension, hypothyroidism, smoker, stage II chronic kidney disease    Examination-Activity Limitations Locomotion Level;Stand;Stairs;Transfers    Examination-Participation Restrictions Community Activity;Yard Work;Shop   work   Advice worker Good    PT Frequency 2x / week    PT Duration 8 weeks    PT Treatment/Interventions ADLs/Self Care Home Management;Cryotherapy;Moist Heat;DME Instruction;Gait training;Stair training;Functional mobility training;Neuromuscular re-education;Balance training;Therapeutic exercise;Therapeutic activities;Patient/family education;Manual techniques;Passive range of motion;Vestibular    PT Next Visit Plan Gait training on varied surfaces without AD. Standing balance progression, right leg strengthening.    Consulted and Agree with Plan of Care Patient           Patient will benefit from skilled therapeutic intervention in order to improve the following deficits and impairments:  Abnormal gait, Decreased activity tolerance, Decreased balance, Decreased endurance,  Decreased knowledge of use of DME, Decreased mobility, Decreased range of motion, Decreased strength, Pain, Impaired sensation, Impaired tone, Impaired UE functional use  Visit Diagnosis: Muscle weakness (generalized)     Problem List Patient Active Problem List   Diagnosis Date Noted  . CVA (cerebral vascular accident) ([place]) 02/26/2020  . CKD (chronic kidney disease) stage 3, GFR 30-59 ml/min   . Bradycardia   . Elevated TSH   . Multiple joint pain 11/07/2019  . Acute pain of right shoulder 09/19/2019  . Right hip pain 09/19/2019  . Prediabetes 09/08/2018  . Chest pain 01/10/2015  . DM (diabetes mellitus) ([place]) 01/10/2015  . Hypertension 01/10/2015  . Hypothyroidism 01/10/2015  . Headache 01/10/2015  . Abdominal pain 01/10/2015  . Vaginitis 04/07/2014  . Pain 04/07/2014  . Vaginal irritation 04/07/2014  . Boil of buttock 04/06/2014  . BV (bacterial vaginosis) 04/06/2014  . Unspecified symptom associated with female genital organs 11/16/2013  . Candidiasis of vulva and vagina 11/16/2013  . Vaginitis and vulvovaginitis, unspecified 06/07/2013  . Pain aggravated by activities of daily living 06/07/2013  . Nonspecific abnormal finding in stool contents 12/02/2011  . Diabetes mellitus  without mention of complication 07/46/0029  . Chronic back pain     Electa Sniff, PT, DPT, NCS 04/19/2020, 10:22 AM  [facility] [address] [place], [place], [address] Phone: [phone]   Fax:  [phone]  Name: Margaret Webster MRN: [id number] Date of Birth: 1961-02-15

## 2020-04-23 ENCOUNTER — Ambulatory Visit: Payer: Medicare Other

## 2020-04-23 ENCOUNTER — Other Ambulatory Visit: Payer: Self-pay

## 2020-04-23 ENCOUNTER — Other Ambulatory Visit: Payer: Self-pay | Admitting: Nurse Practitioner

## 2020-04-23 DIAGNOSIS — R2689 Other abnormalities of gait and mobility: Secondary | ICD-10-CM

## 2020-04-23 DIAGNOSIS — M6281 Muscle weakness (generalized): Secondary | ICD-10-CM | POA: Diagnosis not present

## 2020-04-23 DIAGNOSIS — I69351 Hemiplegia and hemiparesis following cerebral infarction affecting right dominant side: Secondary | ICD-10-CM | POA: Diagnosis not present

## 2020-04-23 DIAGNOSIS — M25511 Pain in right shoulder: Secondary | ICD-10-CM | POA: Diagnosis not present

## 2020-04-23 DIAGNOSIS — M25512 Pain in left shoulder: Secondary | ICD-10-CM | POA: Diagnosis not present

## 2020-04-23 DIAGNOSIS — R7303 Prediabetes: Secondary | ICD-10-CM

## 2020-04-23 DIAGNOSIS — G8929 Other chronic pain: Secondary | ICD-10-CM | POA: Diagnosis not present

## 2020-04-23 MED ORDER — BLOOD GLUCOSE MONITOR KIT: PACK | 0 refills | Status: DC

## 2020-04-23 NOTE — Therapy (Signed)
[facility] [address] [place], [place], [address] Phone: [phone]   Fax:  [phone]  Physical Therapy Treatment  Patient Details  Name: Margaret Webster MRN: [id number] Date of Birth: 1960/11/17 Referring Provider (PT): Referred by Barb Merino but will be following with PCP [name]   Encounter Date: 04/23/2020   PT End of Session - 04/23/20 0854    Visit Number 12    Number of Visits 17    Date for PT Re-Evaluation 63/14/97   90 day cert but 60 day poc   Authorization Type [employer] medicare so 10th visit progress note, FOTO    PT Start Time 0850    PT Stop Time 0928    PT Time Calculation (min) 38 min    Equipment Utilized During Treatment Gait belt   RW, SPC   Activity Tolerance Patient tolerated treatment well    Behavior During Therapy [facility] for tasks assessed/performed           Past Medical History:  Diagnosis Date  . Arthritis    bil shoulders  . Bronchitis   . Chronic back pain   . Diabetes mellitus    stopped Mrtformin 1 mo ago, made her "sick"  . Hypertension   . Hypothyroidism   . Stroke ([place])   . Thyroid disease   . Trigger thumb of right hand     Past Surgical History:  Procedure Laterality Date  . BACK SURGERY  1993  . CARPAL TUNNEL RELEASE  08/2015  . CARPAL TUNNEL RELEASE Left 09/10/2017   Procedure: CARPAL TUNNEL RELEASE;  Surgeon: Leanora Cover, MD;  Location: [facility];  Service: Orthopedics;  Laterality: Left;  . CERVICAL SPINE SURGERY Right 11/2012  . OOPHORECTOMY    . TRIGGER FINGER RELEASE Right 08/06/2017   Procedure: RIGHT THUMB TRIGGER RELEASE;  Surgeon: Leanora Cover, MD;  Location: [facility];  Service: Orthopedics;  Laterality: Right;    There were no vitals filed for this visit.   Subjective Assessment - 04/23/20 0853    Subjective Pt reports she had a nice weekend.    Pertinent History type 2 diabetes, hypertension,  hypothyroidism, smoker, stage II chronic kidney disease    Patient Stated Goals Pt wants to be able to walk better. Also working on quitting smoking.    Currently in Pain? Yes    Pain Score 5     Pain Location Leg    Pain Orientation Right    Pain Descriptors / Indicators Aching    Pain Onset 1 to 4 weeks ago    Pain Frequency Intermittent    Pain Onset 1 to 4 weeks ago                             [facility] Adult PT Treatment/Exercise - 04/23/20 0263      Ambulation/Gait   Ambulation/Gait Yes    Gait Comments Gait on treadmill x 10 min with 5 min at 0.85mph then 1.31mph for remaining.  Pt held with BUE support. Pt was cued for more upright posture and to relax arms more as well as increase step length. BP=142/84 after.      Neuro Re-ed    Neuro Re-ed Details  At counter: Marching gait over blue mat 6' x 8 without UE with verbal cues to move slow and controlled, tandem stance on blue mat 30 sec x 2 each position. Step-ups on 2x4" foam  beam on blue mat x 5 each leg getting balance each time without UE support then with standing on beam with head turns left/right and up/down x 10 each. Standing on blue mat facing up ramp with feet together x 30 sec then with eyes closed x 30 sec then marching in place x 10 each. Close SBA for safety.                    PT Short Term Goals - 04/11/20 1158      PT SHORT TERM GOAL #1   Title Pt will be independent with initial HEP for strengthening and balance to continue at home.    Baseline Pt has been performing initial HEP as instructed    Time 4    Period Weeks    Status Achieved    Target Date 04/07/20      PT SHORT TERM GOAL #2   Title Pt will increase 30 sec sit to stand from 2 reps to 5 or more for improved functional strength.    Baseline 2 reps from standard chair with heavy UE support 03/08/20, 8 sit to stands with hands in lap    Time 4    Period Weeks    Status Achieved    Target Date 04/07/20      PT SHORT TERM  GOAL #3   Title Pt will decrease TUG from 62 sec to <40 sec for improved balance and functional mobility.    Baseline 62 sec without AD on 03/08/20, 03/29/20 8 sec without AD    Time 4    Period Weeks    Status Achieved    Target Date 04/07/20      PT SHORT TERM GOAL #4   Title Pt will ambulate >300' with LRAD mod I on level surfaces for improved household and short community distances.    Baseline 850' on varied surfaces supervision. Is mod I on level surfaces    Time 4    Period Weeks    Status Achieved    Target Date 04/07/20             PT Long Term Goals - 04/16/20 0937      PT LONG TERM GOAL #1   Title Pt will be independent with progressive HEP for strengthening, balance and aerobic exercise to continue gains on own.    Time 8    Period Weeks    Status New      PT LONG TERM GOAL #2   Title Pt will increase Berg Balance score by 6 points for improved balance and decreased fall risk.    Baseline 03/12/20 47/56, 04/16/20 54/56 meeting goal.    Time 8    Period Weeks    Status Achieved      PT LONG TERM GOAL #3   Title Pt will ambulate up/down 4 stairs with reciprocal pattern without railing for improved functional strength.    Time 8    Period Weeks    Status New      PT LONG TERM GOAL #4   Title Pt will ambulate >500' on varied surfaces without AD independently for improved community mobility.    Time 8    Period Weeks    Status New      PT LONG TERM GOAL #5   Title Pt will increase gait speed by >0.10m/s for improved gait safety.    Baseline 03/12/20 gait speed=0.26m/s with RW    Time 8  Period Weeks    Status New      PT LONG TERM GOAL #6   Title Pt will increase FGA from 25/30 to 27 or more for improved balance and gait safety.    Baseline 25/30 on 03/29/20    Time 8    Period Weeks    Status New                 Plan - 04/23/20 1217    Clinical Impression Statement PT continued to focus on improving activity tolerance with use of treadmill  today. Pt able to increase steps as went on as she got more used to treadmill. Also focused on higher level balance on compliant surfaces.    Personal Factors and Comorbidities Comorbidity 3+    Comorbidities type 2 diabetes, hypertension, hypothyroidism, smoker, stage II chronic kidney disease    Examination-Activity Limitations Locomotion Level;Stand;Stairs;Transfers    Examination-Participation Restrictions Community Activity;Yard Work;Shop   work   Advice worker Good    PT Frequency 2x / week    PT Duration 8 weeks    PT Treatment/Interventions ADLs/Self Care Home Management;Cryotherapy;Moist Heat;DME Instruction;Gait training;Stair training;Functional mobility training;Neuromuscular re-education;Balance training;Therapeutic exercise;Therapeutic activities;Patient/family education;Manual techniques;Passive range of motion;Vestibular    PT Next Visit Plan Gait training on varied surfaces without AD. Standing balance progression on compliant surfaces, right leg strengthening.    Consulted and Agree with Plan of Care Patient           Patient will benefit from skilled therapeutic intervention in order to improve the following deficits and impairments:  Abnormal gait, Decreased activity tolerance, Decreased balance, Decreased endurance, Decreased knowledge of use of DME, Decreased mobility, Decreased range of motion, Decreased strength, Pain, Impaired sensation, Impaired tone, Impaired UE functional use  Visit Diagnosis: Other abnormalities of gait and mobility  Muscle weakness (generalized)     Problem List Patient Active Problem List   Diagnosis Date Noted  . CVA (cerebral vascular accident) ([place]) 02/26/2020  . CKD (chronic kidney disease) stage 3, GFR 30-59 ml/min   . Bradycardia   . Elevated TSH   . Multiple joint pain 11/07/2019  . Acute pain of right shoulder 09/19/2019  . Right hip pain 09/19/2019  .  Prediabetes 09/08/2018  . Chest pain 01/10/2015  . DM (diabetes mellitus) ([place]) 01/10/2015  . Hypertension 01/10/2015  . Hypothyroidism 01/10/2015  . Headache 01/10/2015  . Abdominal pain 01/10/2015  . Vaginitis 04/07/2014  . Pain 04/07/2014  . Vaginal irritation 04/07/2014  . Boil of buttock 04/06/2014  . BV (bacterial vaginosis) 04/06/2014  . Unspecified symptom associated with female genital organs 11/16/2013  . Candidiasis of vulva and vagina 11/16/2013  . Vaginitis and vulvovaginitis, unspecified 06/07/2013  . Pain aggravated by activities of daily living 06/07/2013  . Nonspecific abnormal finding in stool contents 12/02/2011  . Diabetes mellitus without mention of complication 19/62/2297  . Chronic back pain     Electa Sniff, PT, DPT, NCS 04/23/2020, 12:19 PM  [facility] [address] [place], [place], [address] Phone: [phone]   Fax:  [phone]  Name: Margaret Webster MRN: [id number] Date of Birth: [birth year]

## 2020-04-24 ENCOUNTER — Ambulatory Visit: Payer: Self-pay

## 2020-04-24 ENCOUNTER — Telehealth: Payer: Self-pay

## 2020-04-24 DIAGNOSIS — R7303 Prediabetes: Secondary | ICD-10-CM

## 2020-04-24 DIAGNOSIS — M1991 Primary osteoarthritis, unspecified site: Secondary | ICD-10-CM

## 2020-04-24 DIAGNOSIS — N1831 Chronic kidney disease, stage 3a: Secondary | ICD-10-CM

## 2020-04-24 DIAGNOSIS — I10 Essential (primary) hypertension: Secondary | ICD-10-CM

## 2020-04-25 ENCOUNTER — Telehealth: Payer: Self-pay

## 2020-04-25 NOTE — Chronic Care Management (AMB) (Signed)
  Chronic Care Management   Outreach Note  04/25/2020 Name: Margaret Webster MRN: [id number] DOB: 1961/05/20  Referred by: [name], FNP Reason for referral : Chronic Care Management (RQ Initial RN CM Call )   An unsuccessful telephone outreach was attempted today. The patient was referred to the case management team for assistance with care management and care coordination.   Follow Up Plan: Telephone follow up appointment with care management team member scheduled for: 05/17/20  Barb Merino, RN, BSN, CCM Care Management Coordinator [facility]  Direct Phone: [phone]

## 2020-04-25 NOTE — Telephone Encounter (Signed)
I called patient to see if she requested a back/knee brace and sleeves for her shoulder. Left pt v/m to call me back here at the office [facility]

## 2020-04-26 ENCOUNTER — Other Ambulatory Visit: Payer: Self-pay

## 2020-04-26 ENCOUNTER — Ambulatory Visit: Payer: Medicare Other | Attending: Internal Medicine

## 2020-04-26 DIAGNOSIS — M6281 Muscle weakness (generalized): Secondary | ICD-10-CM

## 2020-04-26 DIAGNOSIS — R2689 Other abnormalities of gait and mobility: Secondary | ICD-10-CM

## 2020-04-26 DIAGNOSIS — R7303 Prediabetes: Secondary | ICD-10-CM

## 2020-04-26 MED ORDER — BLOOD GLUCOSE MONITOR KIT: PACK | 0 refills | Status: DC

## 2020-04-26 NOTE — Therapy (Signed)
[facility] [address] [place], [place], [address] Phone: [phone]   Fax:  [phone]  Physical Therapy Treatment  Patient Details  Name: Margaret Webster MRN: [id number] Date of Birth: January 11, 1961 Referring Provider (PT): Referred by Barb Merino but will be following with PCP [name]   Encounter Date: 04/26/2020   PT End of Session - 04/26/20 0848    Visit Number 13    Number of Visits 17    Date for PT Re-Evaluation 73/53/29   90 day cert but 60 day poc   Authorization Type [employer] medicare so 10th visit progress note, FOTO    PT Start Time 0845    PT Stop Time 0932    PT Time Calculation (min) 47 min    Equipment Utilized During Treatment Gait belt   RW, SPC   Activity Tolerance Patient tolerated treatment well    Behavior During Therapy [facility] for tasks assessed/performed           Past Medical History:  Diagnosis Date  . Arthritis    bil shoulders  . Bronchitis   . Chronic back pain   . Diabetes mellitus    stopped Mrtformin 1 mo ago, made her "sick"  . Hypertension   . Hypothyroidism   . Stroke ([place])   . Thyroid disease   . Trigger thumb of right hand     Past Surgical History:  Procedure Laterality Date  . BACK SURGERY  1993  . CARPAL TUNNEL RELEASE  08/2015  . CARPAL TUNNEL RELEASE Left 09/10/2017   Procedure: CARPAL TUNNEL RELEASE;  Surgeon: Leanora Cover, MD;  Location: [place];  Service: Orthopedics;  Laterality: Left;  . CERVICAL SPINE SURGERY Right 11/2012  . OOPHORECTOMY    . TRIGGER FINGER RELEASE Right 08/06/2017   Procedure: RIGHT THUMB TRIGGER RELEASE;  Surgeon: Leanora Cover, MD;  Location: [facility];  Service: Orthopedics;  Laterality: Right;    There were no vitals filed for this visit.   Subjective Assessment - 04/26/20 0848    Subjective Pt reports that she was standing up on a bench and did not realize it was rotted out and her leg went  through it. Scrape to right shin and some bruising noted. Also got surprised by her grandaughter who is home from [place].    Pertinent History type 2 diabetes, hypertension, hypothyroidism, smoker, stage II chronic kidney disease    Patient Stated Goals Pt wants to be able to walk better. Also working on quitting smoking.    Currently in Pain? Yes    Pain Score 9     Pain Location Leg    Pain Orientation Right    Pain Descriptors / Indicators Sore    Pain Type Acute pain    Pain Onset 1 to 4 weeks ago    Pain Onset 1 to 4 weeks ago                             [facility] Adult PT Treatment/Exercise - 04/26/20 9242      Ambulation/Gait   Ambulation/Gait Yes    Ambulation/Gait Assistance 5: Supervision;7: Independent    Ambulation/Gait Assistance Details around in gym with activities. Independent with normal gait and supervision with activities.    Assistive device None    Gait Pattern Step-through pattern    Gait Comments Gait on treadmill x 10 min at 1.86mph with BUE support. Pt was  cued to increase step length and heel strike and try to relax hands more. BP=144/88      Neuro Re-ed    Neuro Re-ed Details  Gait over obstacle course: marching over blue mat, reciprocal steps over floor ladder, reciprocal steps over 6 hurdles of varied height x 6 laps then sidestepping through course  x 4 laps. Standing without UE support alternating taps on soccerball x 10 each, SLS with 1 leg on ball moving forward and back and then side to side x 10 each performed on each leg supervision. Dynamic gait activities in hallway: normal gait 40' x 2, gait with head turns left/right 40' x 2, head turns up/down 40' x 2, large steps 40' x 2, backwards gait 40' x 2, side stepping with squat 40' x 2. Close SBA with all activities and verbal cues for form throughout.                   PT Education - 04/26/20 1053    Education Details Pt instructed to ice right shin to help with swelling/bruising  from stepping through bench that was rotted out. Discussed planned d/c end of next week.    Person(s) Educated Patient    Methods Explanation    Comprehension Verbalized understanding            PT Short Term Goals - 04/11/20 1158      PT SHORT TERM GOAL #1   Title Pt will be independent with initial HEP for strengthening and balance to continue at home.    Baseline Pt has been performing initial HEP as instructed    Time 4    Period Weeks    Status Achieved    Target Date 04/07/20      PT SHORT TERM GOAL #2   Title Pt will increase 30 sec sit to stand from 2 reps to 5 or more for improved functional strength.    Baseline 2 reps from standard chair with heavy UE support 03/08/20, 8 sit to stands with hands in lap    Time 4    Period Weeks    Status Achieved    Target Date 04/07/20      PT SHORT TERM GOAL #3   Title Pt will decrease TUG from 62 sec to <40 sec for improved balance and functional mobility.    Baseline 62 sec without AD on 03/08/20, 03/29/20 8 sec without AD    Time 4    Period Weeks    Status Achieved    Target Date 04/07/20      PT SHORT TERM GOAL #4   Title Pt will ambulate >300' with LRAD mod I on level surfaces for improved household and short community distances.    Baseline 850' on varied surfaces supervision. Is mod I on level surfaces    Time 4    Period Weeks    Status Achieved    Target Date 04/07/20             PT Long Term Goals - 04/16/20 0937      PT LONG TERM GOAL #1   Title Pt will be independent with progressive HEP for strengthening, balance and aerobic exercise to continue gains on own.    Time 8    Period Weeks    Status New      PT LONG TERM GOAL #2   Title Pt will increase Berg Balance score by 6 points for improved balance and decreased fall risk.  Baseline 03/12/20 47/56, 04/16/20 54/56 meeting goal.    Time 8    Period Weeks    Status Achieved      PT LONG TERM GOAL #3   Title Pt will ambulate up/down 4 stairs with  reciprocal pattern without railing for improved functional strength.    Time 8    Period Weeks    Status New      PT LONG TERM GOAL #4   Title Pt will ambulate >500' on varied surfaces without AD independently for improved community mobility.    Time 8    Period Weeks    Status New      PT LONG TERM GOAL #5   Title Pt will increase gait speed by >0.8m/s for improved gait safety.    Baseline 03/12/20 gait speed=0.54m/s with RW    Time 8    Period Weeks    Status New      PT LONG TERM GOAL #6   Title Pt will increase FGA from 25/30 to 27 or more for improved balance and gait safety.    Baseline 25/30 on 03/29/20    Time 8    Period Weeks    Status New                 Plan - 04/26/20 1051    Clinical Impression Statement PT continued to focus on dynamic gait activities with increasing SLS time. Pt continues to show improved stability and activity tolerance with all activities.    Personal Factors and Comorbidities Comorbidity 3+    Comorbidities type 2 diabetes, hypertension, hypothyroidism, smoker, stage II chronic kidney disease    Examination-Activity Limitations Locomotion Level;Stand;Stairs;Transfers    Examination-Participation Restrictions Community Activity;Yard Work;Shop   work   Advice worker Good    PT Frequency 2x / week    PT Duration 8 weeks    PT Treatment/Interventions ADLs/Self Care Home Management;Cryotherapy;Moist Heat;DME Instruction;Gait training;Stair training;Functional mobility training;Neuromuscular re-education;Balance training;Therapeutic exercise;Therapeutic activities;Patient/family education;Manual techniques;Passive range of motion;Vestibular    PT Next Visit Plan Start checking goals for planned discharge end of next week. Gait training on varied surfaces without AD. Standing balance progression on compliant surfaces, right leg strengthening.    Consulted and Agree with Plan of  Care Patient           Patient will benefit from skilled therapeutic intervention in order to improve the following deficits and impairments:  Abnormal gait, Decreased activity tolerance, Decreased balance, Decreased endurance, Decreased knowledge of use of DME, Decreased mobility, Decreased range of motion, Decreased strength, Pain, Impaired sensation, Impaired tone, Impaired UE functional use  Visit Diagnosis: Other abnormalities of gait and mobility  Muscle weakness (generalized)     Problem List Patient Active Problem List   Diagnosis Date Noted  . CVA (cerebral vascular accident) ([place]) 02/26/2020  . CKD (chronic kidney disease) stage 3, GFR 30-59 ml/min   . Bradycardia   . Elevated TSH   . Multiple joint pain 11/07/2019  . Acute pain of right shoulder 09/19/2019  . Right hip pain 09/19/2019  . Prediabetes 09/08/2018  . Chest pain 01/10/2015  . DM (diabetes mellitus) ([place]) 01/10/2015  . Hypertension 01/10/2015  . Hypothyroidism 01/10/2015  . Headache 01/10/2015  . Abdominal pain 01/10/2015  . Vaginitis 04/07/2014  . Pain 04/07/2014  . Vaginal irritation 04/07/2014  . Boil of buttock 04/06/2014  . BV (bacterial vaginosis) 04/06/2014  . Unspecified symptom associated with female genital organs 11/16/2013  .  Candidiasis of vulva and vagina 11/16/2013  . Vaginitis and vulvovaginitis, unspecified 06/07/2013  . Pain aggravated by activities of daily living 06/07/2013  . Nonspecific abnormal finding in stool contents 12/02/2011  . Diabetes mellitus without mention of complication 10/17/4113  . Chronic back pain     Electa Sniff, PT, DPT, NCS 04/26/2020, 10:55 AM  [facility] [address] [place], [place], [address] Phone: [phone]   Fax:  [phone]  Name: Taffy Delconte MRN: [id number] Date of Birth: 1961-04-25

## 2020-05-01 ENCOUNTER — Ambulatory Visit: Payer: Self-pay

## 2020-05-01 ENCOUNTER — Telehealth: Payer: Self-pay

## 2020-05-01 ENCOUNTER — Ambulatory Visit: Payer: Medicare Other

## 2020-05-01 ENCOUNTER — Other Ambulatory Visit: Payer: Self-pay

## 2020-05-01 DIAGNOSIS — R2689 Other abnormalities of gait and mobility: Secondary | ICD-10-CM | POA: Diagnosis not present

## 2020-05-01 DIAGNOSIS — I10 Essential (primary) hypertension: Secondary | ICD-10-CM

## 2020-05-01 DIAGNOSIS — N1831 Chronic kidney disease, stage 3a: Secondary | ICD-10-CM

## 2020-05-01 DIAGNOSIS — M6281 Muscle weakness (generalized): Secondary | ICD-10-CM

## 2020-05-01 NOTE — Chronic Care Management (AMB) (Signed)
  Chronic Care Management   Outreach Note  05/01/2020 Name: Taisa Deloria MRN: [id number] DOB: [birth year]  Referred by: [name], FNP Reason for referral : Care Coordination   SW placed an unsuccessful outbound call to follow up on progression of patient stated goal. SW left HIPAA compliant voice message requesting a return call.  Follow Up Plan: The care management team will reach out to the patient again over the next 14 days.   Daneen Schick, BSW, CDP Social Worker, Certified Dementia Practitioner [place] [facility] [phone]

## 2020-05-01 NOTE — Therapy (Signed)
[facility] [address] [place], [place], [address] Phone: [phone]   Fax:  [phone]  Physical Therapy Treatment  Patient Details  Name: Margaret Webster MRN: [id number] Date of Birth: 10/15/1961 Referring Provider (PT): Referred by Kuber Ghimire but will be following with PCP Janece [name]   Encounter Date: 05/01/2020   PT End of Session - 05/01/20 0850    Visit Number 14    Number of Visits 17    Date for PT Re-Evaluation 06/06/20   90 day cert but 60 day poc   Authorization Type [employer] medicare so 10th visit progress note, FOTO    PT Start Time 0848    PT Stop Time 0930    PT Time Calculation (min) 42 min    Equipment Utilized During Treatment Gait belt   RW, SPC   Activity Tolerance Patient tolerated treatment well    Behavior During Therapy WFL for tasks assessed/performed           Past Medical History:  Diagnosis Date  . Arthritis    bil shoulders  . Bronchitis   . Chronic back pain   . Diabetes mellitus    stopped Mrtformin 1 mo ago, made her "sick"  . Hypertension   . Hypothyroidism   . Stroke ([facility])   . Thyroid disease   . Trigger thumb of right hand     Past Surgical History:  Procedure Laterality Date  . BACK SURGERY  1993  . CARPAL TUNNEL RELEASE  08/2015  . CARPAL TUNNEL RELEASE Left 09/10/2017   Procedure: CARPAL TUNNEL RELEASE;  Surgeon: Kuzma, Kevin, MD;  Location: [facility];  Service: Orthopedics;  Laterality: Left;  . CERVICAL SPINE SURGERY Right 11/2012  . OOPHORECTOMY    . TRIGGER FINGER RELEASE Right 08/06/2017   Procedure: RIGHT THUMB TRIGGER RELEASE;  Surgeon: Kuzma, Kevin, MD;  Location: [facility];  Service: Orthopedics;  Laterality: Right;    There were no vitals filed for this visit.   Subjective Assessment - 05/01/20 0850    Subjective Pt reports that she had a [place] time at the beach. She was able to do some of the races even. Energy  level doing better.    Pertinent History type 2 diabetes, hypertension, hypothyroidism, smoker, stage II chronic kidney disease    Patient Stated Goals Pt wants to be able to walk better. Also working on quitting smoking.    Currently in Pain? Yes    Pain Score 10-Worst pain ever    Pain Location Leg    Pain Orientation Right    Pain Descriptors / Indicators Aching    Pain Type Acute pain    Pain Onset 1 to 4 weeks ago    Pain Frequency Intermittent    Pain Onset 1 to 4 weeks ago              [facility] PT Assessment - 05/01/20 0854      Functional Gait  Assessment   Gait assessed  Yes    Gait Level Surface Walks 20 ft in less than 5.5 sec, no assistive devices, good speed, no evidence for imbalance, normal gait pattern, deviates no more than 6 in outside of the 12 in walkway width.    Change in Gait Speed Able to smoothly change walking speed without loss of balance or gait deviation. Deviate no more than 6 in outside of the 12 in walkway width.    Gait with Horizontal Head Turns   Performs head turns smoothly with no change in gait. Deviates no more than 6 in outside 12 in walkway width    Gait with Vertical Head Turns Performs head turns with no change in gait. Deviates no more than 6 in outside 12 in walkway width.    Gait and Pivot Turn Pivot turns safely within 3 sec and stops quickly with no loss of balance.    Step Over Obstacle Is able to step over 2 stacked shoe boxes taped together (9 in total height) without changing gait speed. No evidence of imbalance.    Gait with Narrow Base of Support Is able to ambulate for 10 steps heel to toe with no staggering.    Gait with Eyes Closed Walks 20 ft, uses assistive device, slower speed, mild gait deviations, deviates 6-10 in outside 12 in walkway width. Ambulates 20 ft in less than 9 sec but greater than 7 sec.    Ambulating Backwards Walks 20 ft, no assistive devices, good speed, no evidence for imbalance, normal gait    Steps Alternating  feet, no rail.    Total Score 29                         [facility] Adult PT Treatment/Exercise - 05/01/20 0854      Ambulation/Gait   Ambulation/Gait Yes    Ambulation/Gait Assistance 7: Independent    Ambulation/Gait Assistance Details  Pt had no LOB with gait outside today. Reported a little pain in right hip towards end. BP=162/78 after gait and down to 142/76 after sitting 5 min    Ambulation Distance (Feet) 850 Feet    Assistive device None    Gait Pattern Step-through pattern    Ambulation Surface Level;Unlevel;Outdoor;Paved;Grass;Other (comment)   mulch   Gait velocity 7.66 sec=1.3m/s    Stairs Yes    Stairs Assistance 7: Independent    Stair Management Technique No rails;Alternating pattern    Number of Stairs 8      Neuro Re-ed    Neuro Re-ed Details  At counter: Side stepping on blue foam beam 6' x 6 without UE support, tandem gait 6'  x 6 on beam, mini-squats on beam without UE support x 10, standing on foam beam alternating toe taps x 10 on cone, tandem stance on foam beam x 30 sec each position then with left leg posterior with head turns x 10. Close SBA/CGA with activities for safety. Pt most challenged with tandem stance with RLE posterior and with adding head turns.                  PT Education - 05/01/20 1207    Education Details Planned D/C next visit. Results of gait speed and FGA testing    Person(s) Educated Patient    Methods Explanation    Comprehension Verbalized understanding            PT Short Term Goals - 04/11/20 1158      PT SHORT TERM GOAL #1   Title Pt will be independent with initial HEP for strengthening and balance to continue at home.    Baseline Pt has been performing initial HEP as instructed    Time 4    Period Weeks    Status Achieved    Target Date 04/07/20      PT SHORT TERM GOAL #2   Title Pt will increase 30 sec sit to stand from 2 reps to 5 or more for improved functional   strength.    Baseline 2 reps from  standard chair with heavy UE support 03/08/20, 8 sit to stands with hands in lap    Time 4    Period Weeks    Status Achieved    Target Date 04/07/20      PT SHORT TERM GOAL #3   Title Pt will decrease TUG from 62 sec to <40 sec for improved balance and functional mobility.    Baseline 62 sec without AD on 03/08/20, 03/29/20 8 sec without AD    Time 4    Period Weeks    Status Achieved    Target Date 04/07/20      PT SHORT TERM GOAL #4   Title Pt will ambulate >300' with LRAD mod I on level surfaces for improved household and short community distances.    Baseline 850' on varied surfaces supervision. Is mod I on level surfaces    Time 4    Period Weeks    Status Achieved    Target Date 04/07/20             PT Long Term Goals - 05/01/20 1208      PT LONG TERM GOAL #1   Title Pt will be independent with progressive HEP for strengthening, balance and aerobic exercise to continue gains on own.    Time 8    Period Weeks    Status New      PT LONG TERM GOAL #2   Title Pt will increase Berg Balance score by 6 points for improved balance and decreased fall risk.    Baseline 03/12/20 47/56, 04/16/20 54/56 meeting goal.    Time 8    Period Weeks    Status Achieved      PT LONG TERM GOAL #3   Title Pt will ambulate up/down 4 stairs with reciprocal pattern without railing for improved functional strength.    Baseline 8 steps without rail independently on 05/01/20    Time 8    Period Weeks    Status Achieved      PT LONG TERM GOAL #4   Title Pt will ambulate >500' on varied surfaces without AD independently for improved community mobility.    Baseline 850' outside independently on 05/01/20    Time 8    Period Weeks    Status Achieved      PT LONG TERM GOAL #5   Title Pt will increase gait speed by >0.2m/s for improved gait safety.    Baseline 03/12/20 gait speed=0.59m/s with RW    Time 8    Period Weeks    Status New      PT LONG TERM GOAL #6   Title Pt will increase FGA from  25/30 to 27 or more for improved balance and gait safety.    Baseline 25/30 on 03/29/20, 29/30 on 05/01/20    Time 8    Period Weeks    Status Achieved                 Plan - 05/01/20 1209    Clinical Impression Statement Pt progressing well towards all goals and plan to discharge on time next visit. Met gait goal, gait speed goal and FGA goal today. Pt is ambulating at normal community ambulator speed. She is low fall risk with score of 29/30 on FGA. Pt continues to show improved activity tolerance with all activities.    Personal Factors and Comorbidities Comorbidity 3+    Comorbidities type 2 diabetes, hypertension,   hypothyroidism, smoker, stage II chronic kidney disease    Examination-Activity Limitations Locomotion Level;Stand;Stairs;Transfers    Examination-Participation Restrictions Community Activity;Yard Work;Shop   work   Advice worker Good    PT Frequency 2x / week    PT Duration 8 weeks    PT Treatment/Interventions ADLs/Self Care Home Management;Cryotherapy;Moist Heat;DME Instruction;Gait training;Stair training;Functional mobility training;Neuromuscular re-education;Balance training;Therapeutic exercise;Therapeutic activities;Patient/family education;Manual techniques;Passive range of motion;Vestibular    PT Next Visit Plan Review HEP for planned d/c next visit.    Consulted and Agree with Plan of Care Patient           Patient will benefit from skilled therapeutic intervention in order to improve the following deficits and impairments:  Abnormal gait, Decreased activity tolerance, Decreased balance, Decreased endurance, Decreased knowledge of use of DME, Decreased mobility, Decreased range of motion, Decreased strength, Pain, Impaired sensation, Impaired tone, Impaired UE functional use  Visit Diagnosis: Other abnormalities of gait and mobility  Muscle weakness (generalized)     Problem  List Patient Active Problem List   Diagnosis Date Noted  . CVA (cerebral vascular accident) ([place]) 02/26/2020  . CKD (chronic kidney disease) stage 3, GFR 30-59 ml/min   . Bradycardia   . Elevated TSH   . Multiple joint pain 11/07/2019  . Acute pain of right shoulder 09/19/2019  . Right hip pain 09/19/2019  . Prediabetes 09/08/2018  . Chest pain 01/10/2015  . DM (diabetes mellitus) ([place]) 01/10/2015  . Hypertension 01/10/2015  . Hypothyroidism 01/10/2015  . Headache 01/10/2015  . Abdominal pain 01/10/2015  . Vaginitis 04/07/2014  . Pain 04/07/2014  . Vaginal irritation 04/07/2014  . Boil of buttock 04/06/2014  . BV (bacterial vaginosis) 04/06/2014  . Unspecified symptom associated with female genital organs 11/16/2013  . Candidiasis of vulva and vagina 11/16/2013  . Vaginitis and vulvovaginitis, unspecified 06/07/2013  . Pain aggravated by activities of daily living 06/07/2013  . Nonspecific abnormal finding in stool contents 12/02/2011  . Diabetes mellitus without mention of complication 48/18/5631  . Chronic back pain     Electa Sniff, PT, DPT, NCS 05/01/2020, 12:11 PM  [facility] [address] [place], [place], [address] Phone: [phone]   Fax:  [phone]  Name: Rodney Wigger MRN: [id number] Date of Birth: [birth year]

## 2020-05-03 ENCOUNTER — Ambulatory Visit: Payer: Medicare Other

## 2020-05-03 ENCOUNTER — Other Ambulatory Visit: Payer: Self-pay

## 2020-05-03 DIAGNOSIS — M6281 Muscle weakness (generalized): Secondary | ICD-10-CM | POA: Diagnosis not present

## 2020-05-03 DIAGNOSIS — R2689 Other abnormalities of gait and mobility: Secondary | ICD-10-CM

## 2020-05-03 NOTE — Therapy (Signed)
[facility] [address] [place], [place], [address] Phone: [phone]   Fax:  [phone]  Physical Therapy Treatment/Discharge Summary  Patient Details  Name: Margaret Webster MRN: [id number] Date of Birth: 10/21/1961 Referring Provider (PT): Referred by Barb Merino but will be following with PCP [place] SUMMARY  Visits from Start of Care: 15  Current functional level related to goals / functional outcomes: Please see clinical impression and goals for more info.   Remaining deficits: none   Education / Equipment: HEP  Plan: Patient agrees to discharge.  Patient goals were met. Patient is being discharged due to meeting the stated rehab goals.  ?????       Encounter Date: 05/03/2020   PT End of Session - 05/03/20 0851    Visit Number 15    Number of Visits 17    Date for PT Re-Evaluation 38/75/64   90 day cert but 60 day poc   Authorization Type [employer] medicare so 10th visit progress note, FOTO    PT Start Time 0850    PT Stop Time 0929    PT Time Calculation (min) 39 min    Equipment Utilized During Treatment Gait belt   RW, SPC   Activity Tolerance Patient tolerated treatment well    Behavior During Therapy WFL for tasks assessed/performed           Past Medical History:  Diagnosis Date  . Arthritis    bil shoulders  . Bronchitis   . Chronic back pain   . Diabetes mellitus    stopped Mrtformin 1 mo ago, made her "sick"  . Hypertension   . Hypothyroidism   . Stroke ([place])   . Thyroid disease   . Trigger thumb of right hand     Past Surgical History:  Procedure Laterality Date  . BACK SURGERY  1993  . CARPAL TUNNEL RELEASE  08/2015  . CARPAL TUNNEL RELEASE Left 09/10/2017   Procedure: CARPAL TUNNEL RELEASE;  Surgeon: Leanora Cover, MD;  Location: [facility];  Service: Orthopedics;  Laterality: Left;  . CERVICAL SPINE SURGERY Right 11/2012  .  OOPHORECTOMY    . TRIGGER FINGER RELEASE Right 08/06/2017   Procedure: RIGHT THUMB TRIGGER RELEASE;  Surgeon: Leanora Cover, MD;  Location: [facility];  Service: Orthopedics;  Laterality: Right;    There were no vitals filed for this visit.   Subjective Assessment - 05/03/20 0852    Subjective Pt reports that she is doing well but a little achy with the rain.    Pertinent History type 2 diabetes, hypertension, hypothyroidism, smoker, stage II chronic kidney disease    Patient Stated Goals Pt wants to be able to walk better. Also working on quitting smoking.    Currently in Pain? Yes    Pain Score 8     Pain Location Leg   shin   Pain Orientation Right    Pain Descriptors / Indicators Aching    Pain Type Acute pain    Pain Onset 1 to 4 weeks ago    Pain Onset 1 to 4 weeks ago                             [facility] Adult PT Treatment/Exercise - 05/03/20 0854      Ambulation/Gait   Ambulation/Gait Yes    Ambulation/Gait Assistance 7: Independent    Ambulation/Gait Assistance Details Around  in clinic between activities.    Assistive device None    Gait Pattern Step-through pattern    Ambulation Surface Level;Indoor    Gait velocity 8.71 sec=1.73ms    Gait Comments Pt ambulated on treadmill x 10 min at 1.440m with bilateral UE support. Pt with upright posture throughout with good reciprocal pattern.      Exercises   Exercises Other Exercises    Other Exercises  Pt performed HEP to reveiew as noted below.            Exercises Supine Bridge - 1 x daily - 5 x weekly - 2 sets - 10 reps- performed x 10 Clamshell - 1 x daily - 5 x weekly - 2 sets - 10 reps- performed x 10 on RLE Standing Hip Abduction with Counter Support - 1 x daily - 5 x weekly - 2 sets - 10 reps- performed x 10 Standing Hip Extension with Counter Support - 1 x daily - 5 x weekly - 2 sets - 10 reps-performed x 10 Mini Squat with Counter Support - 1 x daily - 5 x weekly - 2 sets - 10  reps- performed x 10 Standing Gastroc Stretch at Counter - 2 x daily - 7 x weekly - 1 sets - 3 reps - 30 sec hold- verbally reviewed Tandem Stance - 2 x daily - 7 x weekly - 1 sets - 3 reps - 20-30 sec hold- performed in corner Standing in corner eyes open and eyes closed - 2 x daily - 7 x weekly - 1 sets - 3 reps - 20-30sec hold- performed in corner Supine Lower Trunk Rotation - 1 x daily - 7 x weekly - 10 reps - 3 sets performed x 10 Cross Legged Seat with Side Bend - 1 x daily - 7 x weekly - 3 sets - 10 reps-verbally reviewed. Standing Alternating Knee Flexion - 1 x daily - 7 x weekly - 10 reps - 3 sets- performed x 10 Supine Hip Flexor Stretch with Weight - 1 x daily - 7 x weekly - 2 sets - 3 reps - 30 sec hold- reviewed with performance on right.        PT Education - 05/03/20 1907    Education Details Reviewed HEP. Discussed d/c.    Person(s) Educated Patient    Methods Explanation;Demonstration    Comprehension Verbalized understanding            PT Short Term Goals - 04/11/20 1158      PT SHORT TERM GOAL #1   Title Pt will be independent with initial HEP for strengthening and balance to continue at home.    Baseline Pt has been performing initial HEP as instructed    Time 4    Period Weeks    Status Achieved    Target Date 04/07/20      PT SHORT TERM GOAL #2   Title Pt will increase 30 sec sit to stand from 2 reps to 5 or more for improved functional strength.    Baseline 2 reps from standard chair with heavy UE support 03/08/20, 8 sit to stands with hands in lap    Time 4    Period Weeks    Status Achieved    Target Date 04/07/20      PT SHORT TERM GOAL #3   Title Pt will decrease TUG from 62 sec to <40 sec for improved balance and functional mobility.    Baseline 62 sec without AD on 03/08/20,  03/29/20 8 sec without AD    Time 4    Period Weeks    Status Achieved    Target Date 04/07/20      PT SHORT TERM GOAL #4   Title Pt will ambulate >300' with LRAD mod I  on level surfaces for improved household and short community distances.    Baseline 850' on varied surfaces supervision. Is mod I on level surfaces    Time 4    Period Weeks    Status Achieved    Target Date 04/07/20             PT Long Term Goals - 05/03/20 0856      PT LONG TERM GOAL #1   Title Pt will be independent with progressive HEP for strengthening, balance and aerobic exercise to continue gains on own.    Time 8    Period Weeks    Status Achieved      PT LONG TERM GOAL #2   Title Pt will increase Berg Balance score by 6 points for improved balance and decreased fall risk.    Baseline 03/12/20 47/56, 04/16/20 54/56 meeting goal.    Time 8    Period Weeks    Status Achieved      PT LONG TERM GOAL #3   Title Pt will ambulate up/down 4 stairs with reciprocal pattern without railing for improved functional strength.    Baseline 8 steps without rail independently on 05/01/20    Time 8    Period Weeks    Status Achieved      PT LONG TERM GOAL #4   Title Pt will ambulate >500' on varied surfaces without AD independently for improved community mobility.    Baseline 850' outside independently on 05/01/20    Time 8    Period Weeks    Status Achieved      PT LONG TERM GOAL #5   Title Pt will increase gait speed by >0.40m/s for improved gait safety.    Baseline 03/12/20 gait speed=0.82m/s with RW, 1.62m/s on 05/03/20    Time 8    Period Weeks    Status Achieved      PT LONG TERM GOAL #6   Title Pt will increase FGA from 25/30 to 27 or more for improved balance and gait safety.    Baseline 25/30 on 03/29/20, 29/30 on 05/01/20    Time 8    Period Weeks    Status Achieved                 Plan - 05/03/20 1909    Clinical Impression Statement Pt has met all goals at this time. She is ambulating without AD, independently in the communmity. Low fall risk based on FGA of 29/30. Her gait speed is within normal range. PT discharging at this time.    Personal Factors and  Comorbidities Comorbidity 3+    Comorbidities type 2 diabetes, hypertension, hypothyroidism, smoker, stage II chronic kidney disease    Examination-Activity Limitations Locomotion Level;Stand;Stairs;Transfers    Examination-Participation Restrictions Community Activity;Yard Work;Shop   work   Community education officer Good    PT Frequency 2x / week    PT Duration 8 weeks    PT Treatment/Interventions ADLs/Self Care Home Management;Cryotherapy;Moist Heat;DME Instruction;Gait training;Stair training;Functional mobility training;Neuromuscular re-education;Balance training;Therapeutic exercise;Therapeutic activities;Patient/family education;Manual techniques;Passive range of motion;Vestibular    PT Next Visit Plan D/C today    Consulted and Agree with Plan of Care  Patient           Patient will benefit from skilled therapeutic intervention in order to improve the following deficits and impairments:  Abnormal gait, Decreased activity tolerance, Decreased balance, Decreased endurance, Decreased knowledge of use of DME, Decreased mobility, Decreased range of motion, Decreased strength, Pain, Impaired sensation, Impaired tone, Impaired UE functional use  Visit Diagnosis: Other abnormalities of gait and mobility  Muscle weakness (generalized)     Problem List Patient Active Problem List   Diagnosis Date Noted  . CVA (cerebral vascular accident) ([place]) 02/26/2020  . CKD (chronic kidney disease) stage 3, GFR 30-59 ml/min   . Bradycardia   . Elevated TSH   . Multiple joint pain 11/07/2019  . Acute pain of right shoulder 09/19/2019  . Right hip pain 09/19/2019  . Prediabetes 09/08/2018  . Chest pain 01/10/2015  . DM (diabetes mellitus) ([place]) 01/10/2015  . Hypertension 01/10/2015  . Hypothyroidism 01/10/2015  . Headache 01/10/2015  . Abdominal pain 01/10/2015  . Vaginitis 04/07/2014  . Pain 04/07/2014  . Vaginal irritation  04/07/2014  . Boil of buttock 04/06/2014  . BV (bacterial vaginosis) 04/06/2014  . Unspecified symptom associated with female genital organs 11/16/2013  . Candidiasis of vulva and vagina 11/16/2013  . Vaginitis and vulvovaginitis, unspecified 06/07/2013  . Pain aggravated by activities of daily living 06/07/2013  . Nonspecific abnormal finding in stool contents 12/02/2011  . Diabetes mellitus without mention of complication 67/89/3810  . Chronic back pain     Electa Sniff, PT, DPT, NCS 05/03/2020, 7:11 PM  [facility] [address] [place], [place], [address] Phone: [phone]   Fax:  [phone]  Name: Bryttani Blew MRN: [id number] Date of Birth: 1960/12/21

## 2020-05-03 NOTE — Patient Instructions (Signed)
Access Code: [email or web address] URL: [email or web address] Date: 05/03/2020 Prepared by: Cherly Anderson  Exercises Supine Bridge - 1 x daily - 5 x weekly - 2 sets - 10 reps Clamshell - 1 x daily - 5 x weekly - 2 sets - 10 reps Standing Hip Abduction with Counter Support - 1 x daily - 5 x weekly - 2 sets - 10 reps Standing Hip Extension with Counter Support - 1 x daily - 5 x weekly - 2 sets - 10 reps Mini Squat with Counter Support - 1 x daily - 5 x weekly - 2 sets - 10 reps Standing Gastroc Stretch at Counter - 2 x daily - 7 x weekly - 1 sets - 3 reps - 30 sec hold Tandem Stance - 2 x daily - 7 x weekly - 1 sets - 3 reps - 20-30 sec hold Standing in corner eyes open and eyes closed - 2 x daily - 7 x weekly - 1 sets - 3 reps - 20-30sec hold Supine Lower Trunk Rotation - 1 x daily - 7 x weekly - 10 reps - 3 sets Cross Legged Seat with Side Bend - 1 x daily - 7 x weekly - 3 sets - 10 reps Standing Alternating Knee Flexion - 1 x daily - 7 x weekly - 10 reps - 3 sets Supine Hip Flexor Stretch with Weight - 1 x daily - 7 x weekly - 2 sets - 3 reps - 30 sec hold

## 2020-05-09 ENCOUNTER — Ambulatory Visit: Payer: Self-pay

## 2020-05-09 ENCOUNTER — Telehealth: Payer: Self-pay

## 2020-05-09 DIAGNOSIS — I10 Essential (primary) hypertension: Secondary | ICD-10-CM

## 2020-05-09 DIAGNOSIS — N1831 Chronic kidney disease, stage 3a: Secondary | ICD-10-CM

## 2020-05-09 NOTE — Chronic Care Management (AMB) (Signed)
  Chronic Care Management   Outreach Note  05/09/2020 Name: Margaret Webster MRN: [id number] DOB: 1961/09/02  Referred by: [name], FNP Reason for referral : Care Coordination  Second unsuccessful call attempt placed to the patient to assist with care coordination needs. SW left a HIPAA compliant voice message requesting a return call.  Follow Up Plan: The care management team will reach out to the patient again over the next 14 days.   Daneen Schick, BSW, CDP Social Worker, Certified Dementia Practitioner [place] [facility] [phone]

## 2020-05-17 ENCOUNTER — Ambulatory Visit: Payer: Self-pay

## 2020-05-17 ENCOUNTER — Other Ambulatory Visit: Payer: Self-pay

## 2020-05-17 ENCOUNTER — Telehealth: Payer: Medicare Other

## 2020-05-17 DIAGNOSIS — R7303 Prediabetes: Secondary | ICD-10-CM

## 2020-05-17 DIAGNOSIS — N1831 Chronic kidney disease, stage 3a: Secondary | ICD-10-CM

## 2020-05-17 DIAGNOSIS — I10 Essential (primary) hypertension: Secondary | ICD-10-CM

## 2020-05-17 DIAGNOSIS — M1991 Primary osteoarthritis, unspecified site: Secondary | ICD-10-CM

## 2020-05-18 NOTE — Chronic Care Management (AMB) (Signed)
  Chronic Care Management   Follow Up Note   05/18/2020 Name: Margaret Webster MRN: [id number] DOB: [birth year]  Referred by: [name], FNP Reason for referral : Chronic Care Management (RQ #2 Initial RN CM Chart Review )   Margaret Webster is a 59 y.o. year old female who is a primary care patient of [name], [place]. The CCM team was consulted for assistance with chronic disease management and care coordination needs.    Review of patient status, including review of consultants reports, relevant laboratory and other test results, and collaboration with appropriate care team members and the patient's provider was performed as part of comprehensive patient evaluation and provision of chronic care management services.    SDOH (Social Determinants of Health) assessments performed: No See Care Plan activities for detailed interventions related to [facility])   Reviewed chart in preparation to contact patient to assess for CCM needs.     Outpatient Encounter Medications as of 05/17/2020  Medication Sig  . acetaminophen (TYLENOL) 500 MG tablet Take 1,000 mg by mouth every 6 (six) hours as needed for mild pain.  Marland Kitchen amLODipine (NORVASC) 2.5 MG tablet Take 1 tablet (2.5 mg total) by mouth daily.  Marland Kitchen aspirin EC 81 MG tablet Take 81 mg by mouth once.  . blood glucose meter kit and supplies KIT Dispense based on patient and insurance preference. Use up to four times daily as directed. (FOR ICD-9 250.00, 250.01).  Marland Kitchen buPROPion (WELLBUTRIN XL) 150 MG 24 hr tablet Take 1 tablet (150 mg total) by mouth every morning. (Patient taking differently: Take 150 mg by mouth every morning. As needed)  . diclofenac Sodium (VOLTAREN) 1 % GEL Apply 2 g topically 4 (four) times daily.  Marland Kitchen lidocaine (LIDODERM) 5 % Place 1 patch onto the skin daily. Remove & Discard patch within 12 hours or as directed by MD  . metFORMIN (GLUCOPHAGE) 500 MG tablet Take 1 tablet (500 mg total) by mouth 2 (two) times daily with a meal.  .  mometasone (NASONEX) 50 MCG/ACT nasal spray Place 2 sprays into the nose daily.  . polyvinyl alcohol (LIQUIFILM TEARS) 1.4 % ophthalmic solution Place 1 drop into both eyes as needed for dry eyes.  . rosuvastatin (CRESTOR) 40 MG tablet Take 1 tablet (40 mg total) by mouth daily.  . valACYclovir (VALTREX) 500 MG tablet Take 500 mg by mouth daily. Started on 01-28-20 DS 30   No facility-administered encounter medications on file as of 05/17/2020.     Objective:  Lab Results  Component Value Date   HGBA1C 5.9 (H) 02/26/2020   HGBA1C 5.8 (H) 09/19/2019   HGBA1C 5.8 (H) 02/23/2019   Lab Results  Component Value Date   MICROALBUR 150 09/19/2019   LDLCALC 86 04/11/2020   CREATININE 1.01 (H) 02/26/2020   BP Readings from Last 3 Encounters:  04/19/20 132/86  04/11/20 118/74  04/02/20 (!) 142/78   Plan:   Telephone follow up appointment with care management team member scheduled for: 07/06/20  Barb Merino, RN, BSN, CCM Care Management Coordinator [facility]  Direct Phone: [phone]

## 2020-05-21 ENCOUNTER — Telehealth: Payer: Self-pay

## 2020-05-21 NOTE — Chronic Care Management (AMB) (Signed)
  Chronic Care Management   Outreach Note  05/21/2020 Name: Milah Recht MRN: [id number] DOB: 06-04-1961  Referred by: [name], FNP Reason for referral : Care Coordination   Third unsuccessful telephone outreach was attempted today. The patient was referred to the case management team for assistance with care management and care coordination. The patient's primary care provider has been notified of our unsuccessful attempts to make or maintain contact with the patient. The care management team is pleased to engage with this patient at any time in the future should he/she be interested in assistance from the care management team.   Follow Up Plan: A HIPPA compliant phone message was left for the patient providing contact information and requesting a return call.  No further follow up required: the patient has been un-enrolled from embedded care management due to an inability to maintain patient contact.  Daneen Schick, BSW, CDP Social Worker, Certified Dementia Practitioner [facility] [phone]

## 2020-06-05 ENCOUNTER — Other Ambulatory Visit: Payer: Self-pay

## 2020-06-05 DIAGNOSIS — J301 Allergic rhinitis due to pollen: Secondary | ICD-10-CM

## 2020-06-05 MED ORDER — MOMETASONE FUROATE 50 MCG/ACT NA SUSP: 2.0000 | Freq: Every day | NASAL | 2 refills | Status: DC

## 2020-06-05 MED ORDER — LIDOCAINE 5 % EX PTCH: 1.0000 | MEDICATED_PATCH | CUTANEOUS | 0 refills | Status: DC

## 2020-06-06 ENCOUNTER — Encounter: Payer: Self-pay | Admitting: Nurse Practitioner

## 2020-06-06 ENCOUNTER — Ambulatory Visit (INDEPENDENT_AMBULATORY_CARE_PROVIDER_SITE_OTHER): Payer: Medicare Other | Admitting: Nurse Practitioner

## 2020-06-06 ENCOUNTER — Other Ambulatory Visit: Payer: Self-pay

## 2020-06-06 VITALS — BP 140/78 | HR 61 | Temp 98.4°F | Ht 67.0 in | Wt 224.0 lb

## 2020-06-06 DIAGNOSIS — N1831 Chronic kidney disease, stage 3a: Secondary | ICD-10-CM

## 2020-06-06 DIAGNOSIS — Z789 Other specified health status: Secondary | ICD-10-CM

## 2020-06-06 DIAGNOSIS — R7303 Prediabetes: Secondary | ICD-10-CM | POA: Diagnosis not present

## 2020-06-06 DIAGNOSIS — E782 Mixed hyperlipidemia: Secondary | ICD-10-CM

## 2020-06-06 DIAGNOSIS — I10 Essential (primary) hypertension: Secondary | ICD-10-CM

## 2020-06-06 DIAGNOSIS — Z8673 Personal history of transient ischemic attack (TIA), and cerebral infarction without residual deficits: Secondary | ICD-10-CM

## 2020-06-06 LAB — BMP8+EGFR
BUN/Creatinine Ratio: 10 — ABNORMAL LOW (ref 12–28)
BUN: 11 mg/dL (ref 8–27)
CO2: 23 mmol/L (ref 20–29)
Calcium: 9.7 mg/dL (ref 8.7–10.3)
Chloride: 101 mmol/L (ref 96–106)
Creatinine, Ser: 1.06 mg/dL — ABNORMAL HIGH (ref 0.57–1.00)
GFR calc Af Amer: 65 mL/min/{1.73_m2} (ref >59)
GFR calc non Af Amer: 57 mL/min/{1.73_m2} — ABNORMAL LOW (ref >59)
Glucose: 84 mg/dL (ref 65–99)
Potassium: 4.6 mmol/L (ref 3.5–5.2)
Sodium: 139 mmol/L (ref 134–144)

## 2020-06-06 LAB — HEMOGLOBIN A1C
Est. average glucose Bld gHb Est-mCnc: 131 mg/dL
Hgb A1c MFr Bld: 6.2 % — ABNORMAL HIGH (ref 4.8–5.6)

## 2020-06-06 MED ORDER — NEXLETOL 180 MG PO TABS: 1.0000 | ORAL_TABLET | Freq: Every day | ORAL | 3 refills | Status: DC

## 2020-06-06 MED ORDER — METFORMIN HCL ER 500 MG PO TB24: 500.0000 mg | ORAL_TABLET | Freq: Every day | ORAL | 1 refills | Status: DC

## 2020-06-06 NOTE — Progress Notes (Signed)
This visit occurred during the SARS-CoV-2 public health emergency.  Safety protocols were in place, including screening questions prior to the visit, additional usage of staff PPE, and extensive cleaning of exam room while observing appropriate contact time as indicated for disinfecting solutions.  Subjective:     Patient ID: Margaret Webster , female    DOB: [birth year] , 59 y.o.   MRN: [id number]   Chief Complaint  Patient presents with  . Hypertension  . Prediabetes    HPI  She reports when she took the cholesterol medication she would have numbness to her lips.    Wt Readings from Last 3 Encounters: 06/06/20 : 224 lb (101.6 kg) 04/11/20 : 222 lb (100.7 kg) 03/18/20 : 221 lb 6.4 oz (100.4 kg)   Hypertension This is a chronic problem. The current episode started more than 1 year ago. The problem is unchanged. Condition status: no blood pressure this visit due to being virtual. Pertinent negatives include no palpitations. There are no associated agents to hypertension. Risk factors for coronary artery disease include sedentary lifestyle. There are no compliance problems.  There is no history of angina. There is no history of chronic renal disease.  Diabetes She presents for her follow-up diabetic visit. Diabetes type: prediabetes. Her disease course has been stable. There are no hypoglycemic associated symptoms. Pertinent negatives for hypoglycemia include no dizziness or nervousness/anxiousness. There are no diabetic associated symptoms. Pertinent negatives for diabetes include no fatigue, no polydipsia, no polyphagia and no polyuria. There are no hypoglycemic complications. Risk factors for coronary artery disease include sedentary lifestyle. Current diabetic treatment includes diet. She is following a generally healthy diet. She rarely participates in exercise.     Past Medical History:  Diagnosis Date  . Arthritis    bil shoulders  . Bronchitis   . Chronic back pain   .  Diabetes mellitus    stopped Mrtformin 1 mo ago, made her "sick"  . Hypertension   . Hypothyroidism   . Stroke ([place])   . Thyroid disease   . Trigger thumb of right hand      Family History  Problem Relation Age of Onset  . Diabetes Mother   . Cystic fibrosis Sister   . Cancer Sister        ?  Marland Kitchen Heart disease Maternal Grandmother   . Clotting disorder Maternal Grandfather      Current Outpatient Medications:  .  amLODipine (NORVASC) 2.5 MG tablet, Take 1 tablet (2.5 mg total) by mouth daily., Disp: 30 tablet, Rfl: 11 .  aspirin EC 81 MG tablet, Take 81 mg by mouth once., Disp: , Rfl:  .  blood glucose meter kit and supplies KIT, Dispense based on patient and insurance preference. Use up to four times daily as directed. (FOR ICD-9 250.00, 250.01)., Disp: 1 each, Rfl: 0 .  diclofenac Sodium (VOLTAREN) 1 % GEL, Apply 2 g topically 4 (four) times daily., Disp: 100 g, Rfl: 5 .  lidocaine (LIDODERM) 5 %, Place 1 patch onto the skin daily. Remove & Discard patch within 12 hours or as directed by MD, Disp: 30 patch, Rfl: 0 .  mometasone (NASONEX) 50 MCG/ACT nasal spray, Place 2 sprays into the nose daily., Disp: 17 g, Rfl: 2 .  polyvinyl alcohol (LIQUIFILM TEARS) 1.4 % ophthalmic solution, Place 1 drop into both eyes as needed for dry eyes., Disp: , Rfl:  .  valACYclovir (VALTREX) 500 MG tablet, Take 500 mg by mouth daily. Started on 01-28-20 DS 30,  Disp: , Rfl:  .  acetaminophen (TYLENOL) 500 MG tablet, Take 1,000 mg by mouth every 6 (six) hours as needed for mild pain., Disp: , Rfl:  .  Bempedoic Acid (NEXLETOL) 180 MG TABS, Take 1 tablet by mouth at bedtime., Disp: 30 tablet, Rfl: 3 .  metFORMIN (GLUCOPHAGE-XR) 500 MG 24 hr tablet, Take 1 tablet (500 mg total) by mouth daily with breakfast., Disp: 90 tablet, Rfl: 1 .  rosuvastatin (CRESTOR) 40 MG tablet, Take 1 tablet (40 mg total) by mouth daily. (Patient not taking: Reported on 06/05/2020), Disp: 30 tablet, Rfl: 2   Allergies  Allergen  Reactions  . Sulfa Antibiotics Other (See Comments)    Hard to breathe  . Crestor [Rosuvastatin] Other (See Comments)    Makes her mouth numb and causes side pain  . Ibuprofen Other (See Comments)    Stomach upset  . Polytrim [Polymyxin B-Trimethoprim] Itching and Swelling    redness  . Influenza Vaccines Hives  . Lipitor [Atorvastatin Calcium] Other (See Comments)    Muscle ache   . Naproxen Nausea And Vomiting     Review of Systems  Constitutional: Negative for fatigue.  Eyes: Negative.   Respiratory: Negative.   Cardiovascular: Negative.  Negative for palpitations.  Gastrointestinal: Negative.  Negative for abdominal distention, constipation and diarrhea.  Endocrine: Negative for polydipsia, polyphagia and polyuria.  Musculoskeletal:          Skin: Negative.   Neurological: Negative for dizziness.  Psychiatric/Behavioral: The patient is not nervous/anxious.      Today's Vitals   06/06/20 1041  BP: 140/78  Pulse: 61  Temp: 98.4 F (36.9 C)  TempSrc: Oral  Weight: 224 lb (101.6 kg)  Height: '5\' 7"'$  (1.702 m)  PainSc: 5   PainLoc: Leg   Body mass index is 35.08 kg/m.   Objective:  Physical Exam Constitutional:      Appearance: She is well-developed.  Neck:     Thyroid: No thyromegaly.     Trachea: No tracheal deviation.  Cardiovascular:     Rate and Rhythm: Normal rate and regular rhythm.     Heart sounds: Normal heart sounds.  Pulmonary:     Effort: Pulmonary effort is normal.     Breath sounds: Normal breath sounds.  Musculoskeletal:     Cervical back: Normal range of motion and neck supple.  Skin:    General: Skin is warm and dry.  Neurological:     Mental Status: She is alert and oriented to person, place, and time.         Assessment And Plan:     1. Essential hypertension Comments: chronic, fair control continue with current medications  2. Stage 3a chronic kidney disease Comments: encouraged to avoid NSAIDs and to stay well hydrated  with water  3. Mixed hyperlipidemia Comments: Chronic, she is not willing to take a statin had muscle cramps will try her on nexlitol, history of CVA  - Hemoglobin A1c - BMP8+EGFR - Bempedoic Acid (NEXLETOL) 180 MG TABS; Take 1 tablet by mouth at bedtime.  Dispense: 30 tablet; Refill: 3  4. Prediabetes - Hemoglobin A1c - BMP8+EGFR - metFORMIN (GLUCOPHAGE-XR) 500 MG 24 hr tablet; Take 1 tablet (500 mg total) by mouth daily with breakfast.  Dispense: 90 tablet; Refill: 1  5. Statin intolerance Comments: unable to tolerate due to muscle cramps - Bempedoic Acid (NEXLETOL) 180 MG TABS; Take 1 tablet by mouth at bedtime.  Dispense: 30 tablet; Refill: 3  6. History of CVA (cerebrovascular  accident) - Bempedoic Acid (NEXLETOL) 180 MG TABS; Take 1 tablet by mouth at bedtime.  Dispense: 30 tablet; Refill: 3     Patient was given opportunity to ask questions. Patient verbalized understanding of the plan and was able to repeat key elements of the plan. All questions were answered to their satisfaction.  [name], FNP   I, [name], FNP, have reviewed all documentation for this visit. The documentation on 06/24/20 for the exam, diagnosis, procedures, and orders are all accurate and complete.   THE PATIENT IS ENCOURAGED TO PRACTICE SOCIAL DISTANCING DUE TO THE COVID-19 PANDEMIC.

## 2020-06-06 NOTE — Patient Instructions (Signed)
Follow up with Dr. Erlinda Hong [phone] Neurology

## 2020-06-07 ENCOUNTER — Telehealth: Payer: Self-pay

## 2020-06-07 NOTE — Telephone Encounter (Signed)
Called pharmacy to let them know pts PA was approved for [facility]

## 2020-06-19 ENCOUNTER — Telehealth: Payer: Self-pay

## 2020-06-21 DIAGNOSIS — H1045 Other chronic allergic conjunctivitis: Secondary | ICD-10-CM | POA: Diagnosis not present

## 2020-06-21 DIAGNOSIS — I639 Cerebral infarction, unspecified: Secondary | ICD-10-CM | POA: Diagnosis not present

## 2020-06-21 DIAGNOSIS — H2513 Age-related nuclear cataract, bilateral: Secondary | ICD-10-CM | POA: Diagnosis not present

## 2020-06-21 DIAGNOSIS — G4730 Sleep apnea, unspecified: Secondary | ICD-10-CM | POA: Diagnosis not present

## 2020-06-21 DIAGNOSIS — E119 Type 2 diabetes mellitus without complications: Secondary | ICD-10-CM | POA: Diagnosis not present

## 2020-06-21 LAB — HM DIABETES EYE EXAM

## 2020-07-05 DIAGNOSIS — F32A Depression, unspecified: Secondary | ICD-10-CM | POA: Insufficient documentation

## 2020-07-05 DIAGNOSIS — H9319 Tinnitus, unspecified ear: Secondary | ICD-10-CM | POA: Insufficient documentation

## 2020-07-06 ENCOUNTER — Other Ambulatory Visit: Payer: Self-pay | Admitting: Podiatry

## 2020-07-06 ENCOUNTER — Encounter: Payer: Self-pay | Admitting: Podiatry

## 2020-07-06 ENCOUNTER — Ambulatory Visit (INDEPENDENT_AMBULATORY_CARE_PROVIDER_SITE_OTHER): Payer: Medicare Other | Admitting: Podiatry

## 2020-07-06 ENCOUNTER — Ambulatory Visit: Payer: Self-pay

## 2020-07-06 ENCOUNTER — Telehealth: Payer: Medicare Other

## 2020-07-06 ENCOUNTER — Other Ambulatory Visit: Payer: Self-pay

## 2020-07-06 ENCOUNTER — Ambulatory Visit (INDEPENDENT_AMBULATORY_CARE_PROVIDER_SITE_OTHER): Payer: Medicare Other

## 2020-07-06 DIAGNOSIS — M778 Other enthesopathies, not elsewhere classified: Secondary | ICD-10-CM

## 2020-07-06 DIAGNOSIS — M79675 Pain in left toe(s): Secondary | ICD-10-CM | POA: Diagnosis not present

## 2020-07-06 DIAGNOSIS — B351 Tinea unguium: Secondary | ICD-10-CM

## 2020-07-06 DIAGNOSIS — M779 Enthesopathy, unspecified: Secondary | ICD-10-CM

## 2020-07-06 DIAGNOSIS — R7303 Prediabetes: Secondary | ICD-10-CM

## 2020-07-06 DIAGNOSIS — M79672 Pain in left foot: Secondary | ICD-10-CM

## 2020-07-06 DIAGNOSIS — M79674 Pain in right toe(s): Secondary | ICD-10-CM | POA: Diagnosis not present

## 2020-07-06 DIAGNOSIS — M79671 Pain in right foot: Secondary | ICD-10-CM

## 2020-07-06 DIAGNOSIS — N1831 Chronic kidney disease, stage 3a: Secondary | ICD-10-CM

## 2020-07-06 DIAGNOSIS — I10 Essential (primary) hypertension: Secondary | ICD-10-CM

## 2020-07-06 DIAGNOSIS — E782 Mixed hyperlipidemia: Secondary | ICD-10-CM

## 2020-07-06 DIAGNOSIS — G629 Polyneuropathy, unspecified: Secondary | ICD-10-CM

## 2020-07-06 NOTE — Progress Notes (Signed)
**Note Margaret-Identified via Obfuscation** Subjective:   Patient ID: Margaret Webster, female   DOB: 59 y.o.   MRN: [id number]   HPI Patient presents stating she has been getting pain on top of her feet she has had a stroke and is recovering but has had some strange feelings in her feet and she is concerned about her nails getting ingrown and painful in the corner.  States that she is recovering but still has problems and currently she is trying to stop smoking and is trying to be active   Review of Systems  All other systems reviewed and are negative.       Objective:  Physical Exam Vitals and nursing note reviewed.  Constitutional:      Appearance: She is well-developed.  Pulmonary:     Effort: Pulmonary effort is normal.  Musculoskeletal:        General: Normal range of motion.  Skin:    General: Skin is warm.  Neurological:     Mental Status: She is alert.     Neurovascular status intact muscle strength was found to be adequate range of motion within normal limits.  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  I did sterile prep injected the extensor complex bilateral 3 mg dexamethasone Kenalog 5 mg Xylocaine advised on topical medicines oral medicines and will see back as needed.  Hopefully this will make a big difference for her  X-rays indicate quite a bit of spurring of the dorsal midtarsal joint bilateral

## 2020-07-12 NOTE — Patient Instructions (Signed)
Visit Information  Goals Addressed      Patient Stated   .  "to keep my Diabetes under good control" (pt-stated)        CARE PLAN ENTRY (see longitudinal plan of care for additional care plan information)  Current Barriers:  Marland Kitchen Knowledge Deficits related to disease process and Self Health management of Prediabetes . Chronic Disease Management support and education needs related to Essential hypertension, Stage 3a chronic kidney disease, Mixed hyperlipidemia, Prediabetes  Nurse Case Manager Clinical Goal(s):  Marland Kitchen Over the next 180 days, patient will work with CCM team and PCP to address needs related to disease education and support for improved Self Health management of Diabetes  CCM RN CM Interventions:  07/06/20 call completed with patient  . Inter-disciplinary care team collaboration (see longitudinal plan of care) . Evaluation of current treatment plan related to Prediabetes and patient's adherence to plan as established by provider. . Provided education to patient re: current A1c is at goal but elevated to 6.2%; Educated patient on dietary and exercise recommendations; Educated on 15'15' rule; Educated on daily glycemic control FBS 80-130, <180 after meals . Reviewed medications with patient and discussed patient is adhering to taking her prescribed DM medications, current regimen:  o  metFORMIN (GLUCOPHAGE-XR) 500 MG 24 hr tablet, Take 1 tablet (500 mg total) by mouth daily with breakfast . Provided patient with printed educational materials related to Diabetes Management using Meal Planning; Diabetes Care Schedule; Preventing Complications from Diabetes; Grocery Shopping with Diabetes; Diabetes Zone Safety Tool  . Advised patient, providing education and rationale, to check cbg 1-2 times daily and record, calling the CCM team and PCP for findings outside established parameters  . Discussed plans with patient for ongoing care management follow up and provided patient with direct contact  information for care management team  Patient Self Care Activities:  . Self administers medications as prescribed . Attends all scheduled provider appointments . Calls pharmacy for medication refills . Calls provider office for new concerns or questions  Initial goal documentation       Patient verbalizes understanding of instructions provided today.   Telephone follow up appointment with care management team member scheduled for: 09/28/20  Barb Merino, RN, BSN, CCM Care Management Coordinator [place] [facility]  Direct Phone: [phone]

## 2020-07-12 NOTE — Chronic Care Management (AMB) (Signed)
Chronic Care Management   Follow Up Note   07/06/2020 Name: Margaret Webster MRN: [id number] DOB: 1961/02/28  Referred by: [name], FNP Reason for referral : Chronic Care Management (#2 RQ Initial CCM RN CM Call )   Margaret Webster is a 59 y.o. year old female who is a primary care patient of [name], [place]. The CCM team was consulted for assistance with chronic disease management and care coordination needs.    Review of patient status, including review of consultants reports, relevant laboratory and other test results, and collaboration with appropriate care team members and the patient's provider was performed as part of comprehensive patient evaluation and provision of chronic care management services.    SDOH (Social Determinants of Health) assessments performed: Yes - no acute challenges noted  See Care Plan activities for detailed interventions related to [place])   Placed outbound CCM RN CM call to patient for a care plan update.     Outpatient Encounter Medications as of 07/06/2020  Medication Sig  . amLODipine (NORVASC) 2.5 MG tablet Take 1 tablet (2.5 mg total) by mouth daily.  Marland Kitchen aspirin EC 81 MG tablet Take 81 mg by mouth once.  . Bempedoic Acid (NEXLETOL) 180 MG TABS Take 1 tablet by mouth at bedtime.  . metFORMIN (GLUCOPHAGE-XR) 500 MG 24 hr tablet Take 1 tablet (500 mg total) by mouth daily with breakfast.   No facility-administered encounter medications on file as of 07/06/2020.     Objective:  Lab Results  Component Value Date   HGBA1C 6.2 (H) 06/06/2020   HGBA1C 5.9 (H) 02/26/2020   HGBA1C 5.8 (H) 09/19/2019   Lab Results  Component Value Date   MICROALBUR 150 09/19/2019   LDLCALC 86 04/11/2020   CREATININE 1.06 (H) 06/06/2020   BP Readings from Last 3 Encounters:  06/06/20 140/78  04/19/20 132/86  04/11/20 118/74    Goals Addressed      Patient Stated   .  "to keep my Diabetes under good control" (pt-stated)        [place] (see  longitudinal plan of care for additional care plan information)  Current Barriers:  Marland Kitchen Knowledge Deficits related to disease process and Self Health management of Prediabetes . Chronic Disease Management support and education needs related to Essential hypertension, Stage 3a chronic kidney disease, Mixed hyperlipidemia, Prediabetes  Nurse Case Manager Clinical Goal(s):  Marland Kitchen Over the next 180 days, patient will work with CCM team and PCP to address needs related to disease education and support for improved Self Health management of Diabetes  CCM RN CM Interventions:  07/06/20 call completed with patient  . Inter-disciplinary care team collaboration (see longitudinal plan of care) . Evaluation of current treatment plan related to Prediabetes and patient's adherence to plan as established by provider. . Provided education to patient re: current A1c is at goal but elevated to 6.2%; Educated patient on dietary and exercise recommendations; Educated on 15'15' rule; Educated on daily glycemic control FBS 80-130, <180 after meals . Reviewed medications with patient and discussed patient is adhering to taking her prescribed DM medications, current regimen:  o  metFORMIN (GLUCOPHAGE-XR) 500 MG 24 hr tablet, Take 1 tablet (500 mg total) by mouth daily with breakfast . Provided patient with printed educational materials related to Diabetes Management using Meal Planning; Diabetes Care Schedule; Preventing Complications from Diabetes; Grocery Shopping with Diabetes; Diabetes Zone Safety Tool  . Advised patient, providing education and rationale, to check cbg 1-2 times daily and  record, calling the CCM team and PCP for findings outside established parameters  . Discussed plans with patient for ongoing care management follow up and provided patient with direct contact information for care management team  Patient Self Care Activities:  . Self administers medications as prescribed . Attends all scheduled  provider appointments . Calls pharmacy for medication refills . Calls provider office for new concerns or questions  Initial goal documentation       Plan:   Telephone follow up appointment with care management team member scheduled for: 09/28/20  Barb Merino, RN, BSN, CCM Care Management Coordinator [place] [facility]  Direct Phone: [phone]

## 2020-08-08 ENCOUNTER — Ambulatory Visit: Payer: Medicare Other

## 2020-08-08 DIAGNOSIS — N1831 Chronic kidney disease, stage 3a: Secondary | ICD-10-CM

## 2020-08-08 DIAGNOSIS — I10 Essential (primary) hypertension: Secondary | ICD-10-CM

## 2020-08-09 NOTE — Chronic Care Management (AMB) (Signed)
  Chronic Care Management    Social Work Follow Up Note  08/08/2020 Name: Margaret Webster MRN: [id number] DOB: July 31, 1961  Margaret Webster is a 59 y.o. year old female who is a primary care patient of [name], [place]. The CCM team was consulted for assistance with care coordination.   Review of patient status, including review of consultants reports, other relevant assessments, and collaboration with appropriate care team members and the patient's provider was performed as part of comprehensive patient evaluation and provision of chronic care management services.    SW placed a successful outbound call to conduct a SW screen. Patient does not identify any care coordination needs at this time.  SDOH (Social Determinants of Health) assessments performed: No    Outpatient Encounter Medications as of 08/08/2020  Medication Sig  . amLODipine (NORVASC) 2.5 MG tablet Take 1 tablet (2.5 mg total) by mouth daily.  Marland Kitchen aspirin EC 81 MG tablet Take 81 mg by mouth once.  . Bempedoic Acid (NEXLETOL) 180 MG TABS Take 1 tablet by mouth at bedtime.  . metFORMIN (GLUCOPHAGE-XR) 500 MG 24 hr tablet Take 1 tablet (500 mg total) by mouth daily with breakfast.   No facility-administered encounter medications on file as of 08/08/2020.     Follow Up Plan: A member of the care management team will outreach the patient over the next 60 days.   Daneen Schick, BSW, CDP Social Worker, Certified Dementia Practitioner [place] [facility] [place] [facility] [phone]

## 2020-08-22 ENCOUNTER — Ambulatory Visit: Payer: Medicare Other

## 2020-08-22 DIAGNOSIS — I10 Essential (primary) hypertension: Secondary | ICD-10-CM

## 2020-08-22 DIAGNOSIS — N1831 Chronic kidney disease, stage 3a: Secondary | ICD-10-CM

## 2020-08-22 NOTE — Chronic Care Management (AMB) (Signed)
  Chronic Care Management   Outreach Note  08/22/2020 Name: Margaret Webster MRN: [id number] DOB: January 16, 1961  Referred by: [name], FNP Reason for referral : Care Coordination   SW placed a successful outbound call to the patient in response to a voice message received requesting assistance with the cost of eyeglasses. Patient reports she has completed an eye exam and has a new prescription but is unable to afford the cost of glasses. SW provided the patient with contact number to social services to determine if she qualifies for program under [employer].  Follow Up Plan: No SW follow up planned at this time. SW encouraged the patient to contact SW directly as needed with future care coordination needs.  Daneen Schick, BSW, CDP Social Worker, Certified Dementia Practitioner [place] [facility] [place] [facility] [phone]

## 2020-09-28 ENCOUNTER — Other Ambulatory Visit: Payer: Self-pay

## 2020-09-28 ENCOUNTER — Ambulatory Visit: Payer: Self-pay

## 2020-09-28 ENCOUNTER — Telehealth: Payer: Medicare Other

## 2020-09-28 DIAGNOSIS — I10 Essential (primary) hypertension: Secondary | ICD-10-CM

## 2020-09-28 DIAGNOSIS — R7303 Prediabetes: Secondary | ICD-10-CM

## 2020-09-28 DIAGNOSIS — E782 Mixed hyperlipidemia: Secondary | ICD-10-CM

## 2020-09-28 DIAGNOSIS — N1831 Chronic kidney disease, stage 3a: Secondary | ICD-10-CM

## 2020-10-01 NOTE — Patient Instructions (Signed)
Visit Information  Goals Addressed      Patient Stated   .  "to keep my Diabetes under good control" (pt-stated)        CARE PLAN ENTRY (see longitudinal plan of care for additional care plan information)  Current Barriers:  Marland Kitchen Knowledge Deficits related to disease process and Self Health management of Prediabetes . Chronic Disease Management support and education needs related to Essential hypertension, Stage 3a chronic kidney disease, Mixed hyperlipidemia, Prediabetes  Nurse Case Manager Clinical Goal(s):  Marland Kitchen Over the next 180 days, patient will work with CCM team and PCP to address needs related to disease education and support for improved Self Health management of Diabetes  CCM RN CM Interventions:  09/28/20 call completed with patient  . Inter-disciplinary care team collaboration (see longitudinal plan of care) . Evaluation of current treatment plan related to Prediabetes and patient's adherence to plan as established by provider . Provided education to patient re: current A1c is at goal but elevated to 6.2%; Educated patient on dietary and exercise recommendations; Educated on 15'15' rule; Educated on daily glycemic control FBS 80-130, <180 after meals . Reviewed medications with patient and discussed patient is adhering to taking her prescribed DM medications, current regimen:  o  metFORMIN (GLUCOPHAGE-XR) 500 MG 24 hr tablet, Take 1 tablet (500 mg total) by mouth daily with breakfast .  Confirmed patient received and reviewed printed educational materials related to Diabetes Management using Meal Planning; Diabetes Care Schedule; Preventing Complications from Diabetes; Grocery Shopping with Diabetes; Diabetes Zone Safety Tool  . Advised patient, providing education and rationale, to check cbg 1-2 times daily and record, calling the CCM team and PCP for findings outside established parameters  . Discussed plans with patient for ongoing care management follow up and provided patient with  direct contact information for care management team  Patient Self Care Activities:  . Self administers medications as prescribed . Attends all scheduled provider appointments . Calls pharmacy for medication refills . Calls provider office for new concerns or questions  Please see past updates related to this goal by clicking on the "Past Updates" button in the selected goal         The patient verbalized understanding of instructions, educational materials, and care plan provided today and declined offer to receive copy of patient instructions, educational materials, and care plan.   Telephone follow up appointment with care management team member scheduled for: 11/08/20  Lynne Logan, RN

## 2020-10-01 NOTE — Chronic Care Management (AMB) (Signed)
Chronic Care Management   Follow Up Note   09/28/2020 Name: Margaret Webster MRN: [id number] DOB: [birth year]  Referred by: [name], FNP Reason for referral : Chronic Care Management (RNCM FU Call)   Margaret Webster is a 59 y.o. year old female who is a primary care patient of [name], [place]. The CCM team was consulted for assistance with chronic disease management and care coordination needs.    Review of patient status, including review of consultants reports, relevant laboratory and other test results, and collaboration with appropriate care team members and the patient's provider was performed as part of comprehensive patient evaluation and provision of chronic care management services.    SDOH (Social Determinants of Health) assessments performed: Yes - no acute challenges identified  See Care Plan activities for detailed interventions related to [place])   Placed outbound CCM RN CM follow up call to patient for a care plan update.     Outpatient Encounter Medications as of 09/28/2020  Medication Sig  . amLODipine (NORVASC) 2.5 MG tablet Take 1 tablet (2.5 mg total) by mouth daily.  Marland Kitchen aspirin EC 81 MG tablet Take 81 mg by mouth once.  . Bempedoic Acid (NEXLETOL) 180 MG TABS Take 1 tablet by mouth at bedtime.  . metFORMIN (GLUCOPHAGE-XR) 500 MG 24 hr tablet Take 1 tablet (500 mg total) by mouth daily with breakfast.  . [DISCONTINUED] rosuvastatin (CRESTOR) 40 MG tablet Take 1 tablet (40 mg total) by mouth daily. (Patient not taking: Reported on 06/05/2020)   No facility-administered encounter medications on file as of 09/28/2020.     Objective:  Lab Results  Component Value Date   HGBA1C 6.2 (H) 06/06/2020   HGBA1C 5.9 (H) 02/26/2020   HGBA1C 5.8 (H) 09/19/2019   Lab Results  Component Value Date   MICROALBUR 150 09/19/2019   LDLCALC 86 04/11/2020   CREATININE 1.06 (H) 06/06/2020   BP Readings from Last 3 Encounters:  06/06/20 140/78  04/19/20 132/86   04/11/20 118/74    Goals Addressed      Patient Stated   .  "to keep my Diabetes under good control" (pt-stated)        [place] (see longitudinal plan of care for additional care plan information)  Current Barriers:  Marland Kitchen Knowledge Deficits related to disease process and Self Health management of Prediabetes . Chronic Disease Management support and education needs related to Essential hypertension, Stage 3a chronic kidney disease, Mixed hyperlipidemia, Prediabetes  Nurse Case Manager Clinical Goal(s):  Marland Kitchen Over the next 180 days, patient will work with CCM team and PCP to address needs related to disease education and support for improved Self Health management of Diabetes  CCM RN CM Interventions:  09/28/20 call completed with patient  . Inter-disciplinary care team collaboration (see longitudinal plan of care) . Evaluation of current treatment plan related to Prediabetes and patient's adherence to plan as established by provider . Provided education to patient re: current A1c is at goal but elevated to 6.2%; Educated patient on dietary and exercise recommendations; Educated on 15'15' rule; Educated on daily glycemic control FBS 80-130, <180 after meals . Reviewed medications with patient and discussed patient is adhering to taking her prescribed DM medications, current regimen:  o  metFORMIN (GLUCOPHAGE-XR) 500 MG 24 hr tablet, Take 1 tablet (500 mg total) by mouth daily with breakfast .  Confirmed patient received and reviewed printed educational materials related to Diabetes Management using Meal Planning; Diabetes Care Schedule; Preventing Complications from Diabetes;  Grocery Shopping with Diabetes; Diabetes Zone Safety Tool  . Advised patient, providing education and rationale, to check cbg 1-2 times daily and record, calling the CCM team and PCP for findings outside established parameters  . Discussed plans with patient for ongoing care management follow up and provided patient  with direct contact information for care management team  Patient Self Care Activities:  . Self administers medications as prescribed . Attends all scheduled provider appointments . Calls pharmacy for medication refills . Calls provider office for new concerns or questions  Please see past updates related to this goal by clicking on the "Past Updates" button in the selected goal        Plan:   Telephone follow up appointment with care management team member scheduled for: 11/08/20  Barb Merino, RN, BSN, CCM Care Management Coordinator [place] [facility]  Direct Phone: [phone]

## 2020-10-05 ENCOUNTER — Telehealth: Payer: Medicare Other

## 2020-10-24 ENCOUNTER — Telehealth: Payer: Self-pay

## 2020-10-24 ENCOUNTER — Telehealth: Payer: Medicare Other

## 2020-10-24 NOTE — Telephone Encounter (Signed)
  Chronic Care Management   Outreach Note  10/24/2020 Name: Margaret Webster MRN: [id number] DOB: 02-22-1961  Referred by: [name], FNP Reason for referral : Care Coordination   Margaret Webster is enrolled in a Managed Medicaid Health Plan: No  SW placed a successful outbound call to the patient to assist with OTC benefit information. The patient reports she does not feel well and requests a call at a later date.  Follow Up Plan: The care management team will reach out to the patient again over the next 30 days.   Bevelyn Ngo, BSW, CDP Social Worker, Certified Dementia Practitioner [facility] [phone]

## 2020-10-25 ENCOUNTER — Encounter: Payer: Self-pay | Admitting: Nurse Practitioner

## 2020-10-25 ENCOUNTER — Other Ambulatory Visit: Payer: Self-pay

## 2020-10-25 ENCOUNTER — Ambulatory Visit (INDEPENDENT_AMBULATORY_CARE_PROVIDER_SITE_OTHER): Payer: Medicare Other | Admitting: Nurse Practitioner

## 2020-10-25 VITALS — BP 118/80 | HR 69 | Temp 98.5°F | Ht 67.0 in | Wt 224.4 lb

## 2020-10-25 DIAGNOSIS — Z0000 Encounter for general adult medical examination without abnormal findings: Secondary | ICD-10-CM

## 2020-10-25 DIAGNOSIS — R7303 Prediabetes: Secondary | ICD-10-CM

## 2020-10-25 DIAGNOSIS — M25551 Pain in right hip: Secondary | ICD-10-CM

## 2020-10-25 DIAGNOSIS — M25512 Pain in left shoulder: Secondary | ICD-10-CM | POA: Diagnosis not present

## 2020-10-25 DIAGNOSIS — M1991 Primary osteoarthritis, unspecified site: Secondary | ICD-10-CM | POA: Diagnosis not present

## 2020-10-25 DIAGNOSIS — E782 Mixed hyperlipidemia: Secondary | ICD-10-CM

## 2020-10-25 DIAGNOSIS — I10 Essential (primary) hypertension: Secondary | ICD-10-CM

## 2020-10-25 DIAGNOSIS — G8929 Other chronic pain: Secondary | ICD-10-CM

## 2020-10-25 DIAGNOSIS — Z79899 Other long term (current) drug therapy: Secondary | ICD-10-CM

## 2020-10-25 DIAGNOSIS — R21 Rash and other nonspecific skin eruption: Secondary | ICD-10-CM

## 2020-10-25 DIAGNOSIS — Z720 Tobacco use: Secondary | ICD-10-CM

## 2020-10-25 LAB — POCT URINALYSIS DIPSTICK
Bilirubin, UA: NEGATIVE
Blood, UA: NEGATIVE
Glucose, UA: NEGATIVE
Ketones, UA: NEGATIVE
Leukocytes, UA: NEGATIVE
Nitrite, UA: NEGATIVE
Protein, UA: POSITIVE — AB
Spec Grav, UA: 1.02 (ref 1.010–1.025)
Urobilinogen, UA: 2 E.U./dL — AB
pH, UA: 7 (ref 5.0–8.0)

## 2020-10-25 LAB — POCT UA - MICROALBUMIN
Albumin/Creatinine Ratio, Urine, POC: 300
Creatinine, POC: 100 mg/dL
Microalbumin Ur, POC: 150 mg/L

## 2020-10-25 MED ORDER — TRIAMCINOLONE ACETONIDE 40 MG/ML IJ SUSP
60.0000 mg | Freq: Once | INTRAMUSCULAR | Status: AC
Start: 2020-10-25 — End: 2020-10-25
  Administered 2020-10-25: 60 mg via INTRAMUSCULAR

## 2020-10-25 MED ORDER — NYSTATIN-TRIAMCINOLONE 100000-0.1 UNIT/GM-% EX OINT: 1.0000 "application " | TOPICAL_OINTMENT | Freq: Two times a day (BID) | CUTANEOUS | 0 refills | Status: DC

## 2020-10-25 NOTE — Progress Notes (Signed)
This visit occurred during the SARS-CoV-2 public health emergency.  Safety protocols were in place, including screening questions prior to the visit, additional usage of staff PPE, and extensive cleaning of exam room while observing appropriate contact time as indicated for disinfecting solutions.  Subjective:     Patient ID: Margaret Webster , female    DOB: 08/02/1961 , 59 y.o.   MRN: [id number]   Chief Complaint  Patient presents with  . Annual Exam    HPI  Here for hm.   Wt Readings from Last 3 Encounters: 10/25/20 : 224 lb 6.4 oz (101.8 kg) 06/06/20 : 224 lb (101.6 kg) 04/11/20 : 222 lb (100.7 kg)    Past Medical History:  Diagnosis Date  . Arthritis    bil shoulders  . Bronchitis   . Chronic back pain   . Diabetes mellitus    stopped Mrtformin 1 mo ago, made her "sick"  . Hypertension   . Hypothyroidism   . Stroke ([place])   . Thyroid disease   . Trigger thumb of right hand      Family History  Problem Relation Age of Onset  . Diabetes Mother   . Cystic fibrosis Sister   . Cancer Sister        ?  Marland Kitchen Heart disease Maternal Grandmother   . Clotting disorder Maternal Grandfather      Current Outpatient Medications:  .  amLODipine (NORVASC) 2.5 MG tablet, Take 1 tablet (2.5 mg total) by mouth daily., Disp: 30 tablet, Rfl: 11 .  aspirin EC 81 MG tablet, Take 81 mg by mouth once., Disp: , Rfl:  .  Bempedoic Acid (NEXLETOL) 180 MG TABS, Take 1 tablet by mouth at bedtime., Disp: 30 tablet, Rfl: 3 .  metFORMIN (GLUCOPHAGE-XR) 500 MG 24 hr tablet, Take 1 tablet (500 mg total) by mouth daily with breakfast., Disp: 90 tablet, Rfl: 1 .  nystatin-triamcinolone ointment (MYCOLOG), Apply 1 application topically 2 (two) times daily., Disp: 30 g, Rfl: 0 .  UNABLE TO FIND, Hair,skin and nails : one tablet daily, Disp: , Rfl:   Current Facility-Administered Medications:  .  triamcinolone acetonide (KENALOG-40) injection 60 mg, 60 mg, Intramuscular, Once, [name], FNP    Allergies  Allergen Reactions  . Sulfa Antibiotics Other (See Comments)    Hard to breathe  . Crestor [Rosuvastatin] Other (See Comments)    Makes her mouth numb and causes side pain  . Ibuprofen Other (See Comments)    Stomach upset  . Polytrim [Polymyxin B-Trimethoprim] Itching and Swelling    redness  . Influenza Vaccines Hives  . Lipitor [Atorvastatin Calcium] Other (See Comments)    Muscle ache   . Naproxen Nausea And Vomiting      The patient states she uses post menopausal status for birth control. Negative for Dysmenorrhea and Negative for Menorrhagia. Negative for: breast discharge, breast lump(s), breast pain and breast self exam. Associated symptoms include abnormal vaginal bleeding. Pertinent negatives include abnormal bleeding (hematology), anxiety, decreased libido, depression, difficulty falling sleep, dyspareunia, history of infertility, nocturia, sexual dysfunction, sleep disturbances, urinary incontinence, urinary urgency, vaginal discharge and vaginal itching. Diet regular. 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 She has hip and shoulder pain.       The patient's tobacco use is:  Social History   Tobacco Use  Smoking Status Current Every Day Smoker  . Packs/day: 0.25  . Types: Cigarettes  Smokeless Tobacco Never Used  Tobacco Comment   4 cig a day, not interested in chantix   She has been exposed to passive smoke. The patient's alcohol use is:  Social History   Substance and Sexual Activity  Alcohol Use Yes   Comment: occas   Additional information: Last pap done at [facility] she thinks last PAP done 12/09/2018, next one scheduled for 12/09/2021.    Review of Systems  Constitutional: Negative.   HENT: Negative.   Eyes: Negative.   Respiratory: Negative.   Cardiovascular: Negative.   Gastrointestinal: Negative.   Endocrine: Negative.   Genitourinary: Negative.   Musculoskeletal:  Positive for arthralgias (left shoulder - worse when she lays down to her hands.  ).       Right hip pain - when she sleeps on that side feels better.  She is using pain cream. Feels like she is sitting on her bone. Sharp and burning pain  Skin: Positive for rash (hip, arm and neck had a rash).  Allergic/Immunologic: Negative.   Neurological: Negative.   Hematological: Negative.   Psychiatric/Behavioral: Negative.      Today's Vitals   10/25/20 1427  BP: 118/80  Pulse: 69  Temp: 98.5 F (36.9 C)  TempSrc: Oral  Weight: 224 lb 6.4 oz (101.8 kg)  Height: _0  (1.702 m)  PainSc: 10-Worst pain ever  PainLoc: Arm   Body mass index is 35.15 kg/m.   Objective:  Physical Exam Vitals reviewed.  Constitutional:      General: She is not in acute distress.    Appearance: Normal appearance. She is well-developed. She is obese.  HENT:     Head: Normocephalic and atraumatic.     Right Ear: Hearing, tympanic membrane, ear canal and external ear normal. There is no impacted cerumen.     Left Ear: Hearing, tympanic membrane, ear canal and external ear normal. There is no impacted cerumen.     Nose:     Comments: Deferred - masked    Mouth/Throat:     Comments: Deferred - masked Eyes:     General: Lids are normal.     Extraocular Movements: Extraocular movements intact.     Conjunctiva/sclera: Conjunctivae normal.     Pupils: Pupils are equal, round, and reactive to light.     Funduscopic exam:    Right eye: No papilledema.        Left eye: No papilledema.  Neck:     Thyroid: No thyroid mass.     Vascular: No carotid bruit.  Cardiovascular:     Rate and Rhythm: Normal rate and regular rhythm.     Pulses: Normal pulses.     Heart sounds: Normal heart sounds. No murmur heard.   Pulmonary:     Effort: Pulmonary effort is normal.     Breath sounds: Normal breath sounds.  Chest:     Chest wall: No mass.  Breasts:     Tanner Score is 5.     Right: Normal. No mass, tenderness,  axillary adenopathy or supraclavicular adenopathy.     Left: Normal. No mass, tenderness, axillary adenopathy or supraclavicular adenopathy.    Abdominal:     General: Abdomen is flat. Bowel sounds are normal. There is no distension.     Palpations: Abdomen is soft.     Tenderness: There is no abdominal tenderness.  Genitourinary:    Rectum: Guaiac result negative.  Musculoskeletal:        General: Tenderness (right shoulder) present. No swelling.  Right shoulder: Decreased range of motion.     Left shoulder: Normal range of motion.     Cervical back: Full passive range of motion without pain, normal range of motion and neck supple.     Right lower leg: No edema.     Left lower leg: No edema.     Comments: Negative hawkins and neers  Lymphadenopathy:     Upper Body:     Right upper body: No supraclavicular, axillary or pectoral adenopathy.     Left upper body: No supraclavicular, axillary or pectoral adenopathy.  Skin:    General: Skin is warm and dry.     Capillary Refill: Capillary refill takes less than 2 seconds.  Neurological:     General: No focal deficit present.     Mental Status: She is alert and oriented to person, place, and time.     Cranial Nerves: No cranial nerve deficit.     Sensory: No sensory deficit.  Psychiatric:        Mood and Affect: Mood normal.        Behavior: Behavior normal.        Thought Content: Thought content normal.        Judgment: Judgment normal.         Assessment And Plan:     1. Encounter for general adult medical examination w/o abnormal findings . Behavior modifications discussed and diet history reviewed.   . Pt will continue to exercise regularly and modify diet with low GI, plant based foods and decrease intake of processed foods.  . Recommend intake of daily multivitamin, Vitamin D, and calcium.  . Recommend for preventive screenings, as well as recommend immunizations that include influenza, TDAP  2. Essential  hypertension  Chronic, excellent control  Continue with current medications - POCT Urinalysis Dipstick (81002) - POCT UA - Microalbumin  3. Mixed hyperlipidemia  Chronic, will check lipid panel  Continue with current medications - CMP14+EGFR - Lipid panel  4. Prediabetes  Chronic, was slightly elevated at last visit.  Continue with metformin - Hemoglobin A1c  5. Chronic left shoulder pain  She is having continued pain to her shoulder with decreased range of motion  Negative neers and hawkins  Will refer for PT - triamcinolone acetonide (KENALOG-40) injection 60 mg - Ambulatory referral to Physical Therapy  6. Right hip pain  Will refer for PT - triamcinolone acetonide (KENALOG-40) injection 60 mg - Ambulatory referral to Physical Therapy  7. Primary osteoarthritis, unspecified site  8. Other long term (current) drug therapy - CBC  9. Rash and nonspecific skin eruption  Dermatitis type vs yeast rash underneath breast  Will treat with fungal/steroid cream - nystatin-triamcinolone ointment (MYCOLOG); Apply 1 application topically 2 (two) times daily.  Dispense: 30 g; Refill: 0     Patient was given opportunity to ask questions. Patient verbalized understanding of the plan and was able to repeat key elements of the plan. All questions were answered to their satisfaction.   [name], FNP     I, [name], FNP, have reviewed all documentation for this visit. The documentation on 10/25/20 for the exam, diagnosis, procedures, and orders are all accurate and complete.   THE PATIENT IS ENCOURAGED TO PRACTICE SOCIAL DISTANCING DUE TO THE COVID-19 PANDEMIC.

## 2020-10-25 NOTE — Patient Instructions (Signed)
Health Maintenance, Female Adopting a healthy lifestyle and getting preventive care are important in promoting health and wellness. Ask your health care provider about:  The right schedule for you to have regular tests and exams.  Things you can do on your own to prevent diseases and keep yourself healthy. What should I know about diet, weight, and exercise? Eat a healthy diet   Eat a diet that includes plenty of vegetables, fruits, low-fat dairy products, and lean protein.  Do not eat a lot of foods that are high in solid fats, added sugars, or sodium. Maintain a healthy weight Body mass index (BMI) is used to identify weight problems. It estimates body fat based on height and weight. Your health care provider can help determine your BMI and help you achieve or maintain a healthy weight. Get regular exercise Get regular exercise. This is one of the most important things you can do for your health. Most adults should:  Exercise for at least 150 minutes each week. The exercise should increase your heart rate and make you sweat (moderate-intensity exercise).  Do strengthening exercises at least twice a week. This is in addition to the moderate-intensity exercise.  Spend less time sitting. Even light physical activity can be beneficial. Watch cholesterol and blood lipids Have your blood tested for lipids and cholesterol at 61 years of age, then have this test every 5 years. Have your cholesterol levels checked more often if:  Your lipid or cholesterol levels are high.  You are older than 61 years of age.  You are at high risk for heart disease. What should I know about cancer screening? Depending on your health history and family history, you may need to have cancer screening at various ages. This may include screening for:  Breast cancer.  Cervical cancer.  Colorectal cancer.  Skin cancer.  Lung cancer. What should I know about heart disease, diabetes, and high blood  pressure? Blood pressure and heart disease  High blood pressure causes heart disease and increases the risk of stroke. This is more likely to develop in people who have high blood pressure readings, are of African descent, or are overweight.  Have your blood pressure checked: ? Every 3-5 years if you are 18-39 years of age. ? Every year if you are 40 years old or older. Diabetes Have regular diabetes screenings. This checks your fasting blood sugar level. Have the screening done:  Once every three years after age 40 if you are at a normal weight and have a low risk for diabetes.  More often and at a younger age if you are overweight or have a high risk for diabetes. What should I know about preventing infection? Hepatitis B If you have a higher risk for hepatitis B, you should be screened for this virus. Talk with your health care provider to find out if you are at risk for hepatitis B infection. Hepatitis C Testing is recommended for:  Everyone born from 1945 through 1965.  Anyone with known risk factors for hepatitis C. Sexually transmitted infections (STIs)  Get screened for STIs, including gonorrhea and chlamydia, if: ? You are sexually active and are younger than 61 years of age. ? You are older than 61 years of age and your health care provider tells you that you are at risk for this type of infection. ? Your sexual activity has changed since you were last screened, and you are at increased risk for chlamydia or gonorrhea. Ask your health care provider if   you are at risk.  Ask your health care provider about whether you are at high risk for HIV. Your health care provider may recommend a prescription medicine to help prevent HIV infection. If you choose to take medicine to prevent HIV, you should first get tested for HIV. You should then be tested every 3 months for as long as you are taking the medicine. Pregnancy  If you are about to stop having your period (premenopausal) and  you may become pregnant, seek counseling before you get pregnant.  Take 400 to 800 micrograms (mcg) of folic acid every day if you become pregnant.  Ask for birth control (contraception) if you want to prevent pregnancy. Osteoporosis and menopause Osteoporosis is a disease in which the bones lose minerals and strength with aging. This can result in bone fractures. If you are 65 years old or older, or if you are at risk for osteoporosis and fractures, ask your health care provider if you should:  Be screened for bone loss.  Take a calcium or vitamin D supplement to lower your risk of fractures.  Be given hormone replacement therapy (HRT) to treat symptoms of menopause. Follow these instructions at home: Lifestyle  Do not use any products that contain nicotine or tobacco, such as cigarettes, e-cigarettes, and chewing tobacco. If you need help quitting, ask your health care provider.  Do not use street drugs.  Do not share needles.  Ask your health care provider for help if you need support or information about quitting drugs. Alcohol use  Do not drink alcohol if: ? Your health care provider tells you not to drink. ? You are pregnant, may be pregnant, or are planning to become pregnant.  If you drink alcohol: ? Limit how much you use to 0-1 drink a day. ? Limit intake if you are breastfeeding.  Be aware of how much alcohol is in your drink. In the [place] one drink equals one 12 oz bottle of beer (355 mL), one 5 oz glass of wine (148 mL), or one 1 oz glass of hard liquor (44 mL). General instructions  Schedule regular health, dental, and eye exams.  Stay current with your vaccines.  Tell your health care provider if: ? You often feel depressed. ? You have ever been abused or do not feel safe at home. Summary  Adopting a healthy lifestyle and getting preventive care are important in promoting health and wellness.  Follow your health care provider's instructions about healthy  diet, exercising, and getting tested or screened for diseases.  Follow your health care provider's instructions on monitoring your cholesterol and blood pressure. This information is not intended to replace advice given to you by your health care provider. Make sure you discuss any questions you have with your health care provider. Document Revised: 10/06/2018 Document Reviewed: 10/06/2018 Elsevier Patient Education  2020 Elsevier Inc.  

## 2020-10-26 LAB — CMP14+EGFR
ALT: 13 IU/L (ref 0–32)
AST: 20 IU/L (ref 0–40)
Albumin/Globulin Ratio: 1.5 (ref 1.2–2.2)
Albumin: 4.5 g/dL (ref 3.8–4.8)
Alkaline Phosphatase: 68 IU/L (ref 44–121)
BUN/Creatinine Ratio: 16 (ref 12–28)
BUN: 19 mg/dL (ref 8–27)
Bilirubin Total: 0.2 mg/dL (ref 0.0–1.2)
CO2: 22 mmol/L (ref 20–29)
Calcium: 9.7 mg/dL (ref 8.7–10.3)
Chloride: 101 mmol/L (ref 96–106)
Creatinine, Ser: 1.19 mg/dL — ABNORMAL HIGH (ref 0.57–1.00)
GFR calc Af Amer: 57 mL/min/{1.73_m2} — ABNORMAL LOW (ref >59)
GFR calc non Af Amer: 49 mL/min/{1.73_m2} — ABNORMAL LOW (ref >59)
Globulin, Total: 3.1 g/dL (ref 1.5–4.5)
Glucose: 89 mg/dL (ref 65–99)
Potassium: 4.7 mmol/L (ref 3.5–5.2)
Sodium: 139 mmol/L (ref 134–144)
Total Protein: 7.6 g/dL (ref 6.0–8.5)

## 2020-10-26 LAB — CBC
Hematocrit: 40.5 % (ref 34.0–46.6)
Hemoglobin: 14.1 g/dL (ref 11.1–15.9)
MCH: 31.4 pg (ref 26.6–33.0)
MCHC: 34.8 g/dL (ref 31.5–35.7)
MCV: 90 fL (ref 79–97)
Platelets: 312 10*3/uL (ref 150–450)
RBC: 4.49 x10E6/uL (ref 3.77–5.28)
RDW: 12.4 % (ref 11.7–15.4)
WBC: 7.8 10*3/uL (ref 3.4–10.8)

## 2020-10-26 LAB — LIPID PANEL
Chol/HDL Ratio: 5.7 ratio — ABNORMAL HIGH (ref 0.0–4.4)
Cholesterol, Total: 262 mg/dL — ABNORMAL HIGH (ref 100–199)
HDL: 46 mg/dL (ref >39)
LDL Chol Calc (NIH): 140 mg/dL — ABNORMAL HIGH (ref 0–99)
Triglycerides: 413 mg/dL — ABNORMAL HIGH (ref 0–149)
VLDL Cholesterol Cal: 76 mg/dL — ABNORMAL HIGH (ref 5–40)

## 2020-10-26 LAB — HEMOGLOBIN A1C
Est. average glucose Bld gHb Est-mCnc: 131 mg/dL
Hgb A1c MFr Bld: 6.2 % — ABNORMAL HIGH (ref 4.8–5.6)

## 2020-10-31 ENCOUNTER — Other Ambulatory Visit: Payer: Self-pay | Admitting: Nurse Practitioner

## 2020-10-31 ENCOUNTER — Encounter: Payer: Medicare Other | Admitting: Nurse Practitioner

## 2020-10-31 DIAGNOSIS — Z789 Other specified health status: Secondary | ICD-10-CM

## 2020-10-31 DIAGNOSIS — Z8673 Personal history of transient ischemic attack (TIA), and cerebral infarction without residual deficits: Secondary | ICD-10-CM

## 2020-10-31 DIAGNOSIS — E782 Mixed hyperlipidemia: Secondary | ICD-10-CM

## 2020-10-31 DIAGNOSIS — R7303 Prediabetes: Secondary | ICD-10-CM

## 2020-11-01 ENCOUNTER — Other Ambulatory Visit: Payer: Self-pay

## 2020-11-01 MED ORDER — NYSTATIN 100000 UNIT/GM EX CREA: 1.0000 "application " | TOPICAL_CREAM | Freq: Two times a day (BID) | CUTANEOUS | 0 refills | Status: DC

## 2020-11-01 MED ORDER — TRIAMCINOLONE ACETONIDE 0.5 % EX CREA: 1.0000 "application " | TOPICAL_CREAM | Freq: Two times a day (BID) | CUTANEOUS | 0 refills | Status: DC

## 2020-11-07 ENCOUNTER — Other Ambulatory Visit: Payer: Self-pay

## 2020-11-07 DIAGNOSIS — G8929 Other chronic pain: Secondary | ICD-10-CM

## 2020-11-07 DIAGNOSIS — M25512 Pain in left shoulder: Secondary | ICD-10-CM

## 2020-11-07 NOTE — Progress Notes (Signed)
I have placed a referral to PT so she should be getting a call soon. Otherwise if she wants a referral to an orthopedic we can to that as well.

## 2020-11-08 ENCOUNTER — Telehealth: Payer: Medicare Other

## 2020-11-08 ENCOUNTER — Telehealth: Payer: Self-pay

## 2020-11-08 NOTE — Telephone Encounter (Cosign Needed)
  Chronic Care Management   Outreach Note  11/08/2020 Name: Margaret Webster MRN: [id number] DOB: 01/12/1961  Referred by: [name], FNP Reason for referral : Chronic Care Management (RN CM FU Call )   An unsuccessful telephone outreach was attempted today. The patient was referred to the case management team for assistance with care management and care coordination.   Follow Up Plan: Telephone follow up appointment with care management team member scheduled for: 12/11/20  Barb Merino, RN, BSN, CCM Care Management Coordinator [place] [facility]  Direct Phone: [phone]

## 2020-11-14 ENCOUNTER — Telehealth: Payer: Medicare Other

## 2020-11-14 ENCOUNTER — Telehealth: Payer: Self-pay

## 2020-11-14 NOTE — Telephone Encounter (Signed)
  Chronic Care Management   Outreach Note  11/14/2020 Name: Margaret Webster MRN: [id number] DOB: Mar 24, 1961  Referred by: [name], FNP Reason for referral : Care Coordination   An unsuccessful telephone outreach was attempted today. The patient was referred to the case management team for assistance with care management and care coordination.   Follow Up Plan: A HIPAA compliant phone message was left for the patient providing contact information and requesting a return call.  The care management team will reach out to the patient again over the next 21 days.   Daneen Schick, BSW, CDP Social Worker, Certified Dementia Practitioner [place] [facility] [place] [facility] [phone]

## 2020-11-16 ENCOUNTER — Ambulatory Visit: Payer: Medicare Other | Admitting: Orthopaedic Surgery

## 2020-11-27 ENCOUNTER — Ambulatory Visit: Payer: Medicare Other | Admitting: Orthopaedic Surgery

## 2020-11-27 DIAGNOSIS — M25551 Pain in right hip: Secondary | ICD-10-CM | POA: Diagnosis not present

## 2020-11-27 DIAGNOSIS — M25512 Pain in left shoulder: Secondary | ICD-10-CM | POA: Diagnosis not present

## 2020-11-27 DIAGNOSIS — M25511 Pain in right shoulder: Secondary | ICD-10-CM | POA: Diagnosis not present

## 2020-11-28 ENCOUNTER — Telehealth: Payer: Medicare Other

## 2020-11-28 ENCOUNTER — Telehealth: Payer: Self-pay

## 2020-11-28 NOTE — Telephone Encounter (Signed)
  Chronic Care Management   Outreach Note  11/28/2020 Name: Margaret Webster MRN: [id number] DOB: [birth year]  Referred by: [name], FNP Reason for referral : Care Coordination   A second unsuccessful telephone outreach was attempted today. The patient was referred to the case management team for assistance with care management and care coordination.   Follow Up Plan: A HIPAA compliant phone message was left for the patient providing contact information and requesting a return call.  The care management team will reach out to the patient again over the next 14 days.   Daneen Schick, BSW, CDP Social Worker, Certified Dementia Practitioner [place] [facility] [place] [facility] [phone]

## 2020-11-30 DIAGNOSIS — M25511 Pain in right shoulder: Secondary | ICD-10-CM | POA: Diagnosis not present

## 2020-11-30 DIAGNOSIS — M25512 Pain in left shoulder: Secondary | ICD-10-CM | POA: Diagnosis not present

## 2020-11-30 DIAGNOSIS — M25551 Pain in right hip: Secondary | ICD-10-CM | POA: Diagnosis not present

## 2020-12-04 DIAGNOSIS — M25551 Pain in right hip: Secondary | ICD-10-CM | POA: Diagnosis not present

## 2020-12-04 DIAGNOSIS — M25512 Pain in left shoulder: Secondary | ICD-10-CM | POA: Diagnosis not present

## 2020-12-04 DIAGNOSIS — M25511 Pain in right shoulder: Secondary | ICD-10-CM | POA: Diagnosis not present

## 2020-12-05 DIAGNOSIS — M180 Bilateral primary osteoarthritis of first carpometacarpal joints: Secondary | ICD-10-CM | POA: Diagnosis not present

## 2020-12-05 DIAGNOSIS — M5412 Radiculopathy, cervical region: Secondary | ICD-10-CM | POA: Diagnosis not present

## 2020-12-05 DIAGNOSIS — G5603 Carpal tunnel syndrome, bilateral upper limbs: Secondary | ICD-10-CM | POA: Diagnosis not present

## 2020-12-06 DIAGNOSIS — M25512 Pain in left shoulder: Secondary | ICD-10-CM | POA: Diagnosis not present

## 2020-12-06 DIAGNOSIS — M25511 Pain in right shoulder: Secondary | ICD-10-CM | POA: Diagnosis not present

## 2020-12-06 DIAGNOSIS — M25551 Pain in right hip: Secondary | ICD-10-CM | POA: Diagnosis not present

## 2020-12-11 ENCOUNTER — Telehealth: Payer: Self-pay

## 2020-12-11 ENCOUNTER — Telehealth: Payer: Medicare Other

## 2020-12-11 DIAGNOSIS — M25512 Pain in left shoulder: Secondary | ICD-10-CM | POA: Diagnosis not present

## 2020-12-11 DIAGNOSIS — M25511 Pain in right shoulder: Secondary | ICD-10-CM | POA: Diagnosis not present

## 2020-12-11 DIAGNOSIS — M25551 Pain in right hip: Secondary | ICD-10-CM | POA: Diagnosis not present

## 2020-12-11 NOTE — Telephone Encounter (Cosign Needed)
  Chronic Care Management   Outreach Note  12/11/2020 Name: Margaret Webster MRN: [id number] DOB: 1961-01-28  Referred by: [name], FNP Reason for referral : Chronic Care Management (#2 RN CM FU Call )   A second unsuccessful telephone outreach was attempted today. The patient was referred to the case management team for assistance with care management and care coordination.   Follow Up Plan: Telephone follow up appointment with care management team member scheduled for: 02/04/21  Barb Merino, RN, BSN, CCM Care Management Coordinator [place] [facility]  Direct Phone: [phone]

## 2020-12-13 ENCOUNTER — Telehealth: Payer: Self-pay

## 2020-12-13 ENCOUNTER — Telehealth: Payer: Medicare Other

## 2020-12-13 NOTE — Telephone Encounter (Signed)
  Chronic Care Management   Outreach Note  12/13/2020 Name: Margaret Webster MRN: [id number] DOB: 1960-12-03  Referred by: [name], FNP Reason for referral : Care Coordination   Third unsuccessful telephone outreach was attempted today. The patient was referred to the case management team for assistance with care management and care coordination. The patient's primary care provider has been notified of our unsuccessful attempts to make or maintain contact with the patient. The care management team is pleased to engage with this patient at any time in the future should he/she be interested in assistance from the care management team.     Follow Up Plan: No SW follow up planned at this time due to inability to maintain patient contact.   Daneen Schick, BSW, CDP Social Worker, Certified Dementia Practitioner [place] [facility] [place] [facility] [phone]

## 2020-12-17 ENCOUNTER — Encounter: Payer: Self-pay | Admitting: Nurse Practitioner

## 2020-12-19 DIAGNOSIS — M25551 Pain in right hip: Secondary | ICD-10-CM | POA: Diagnosis not present

## 2020-12-19 DIAGNOSIS — M25511 Pain in right shoulder: Secondary | ICD-10-CM | POA: Diagnosis not present

## 2020-12-19 DIAGNOSIS — M25512 Pain in left shoulder: Secondary | ICD-10-CM | POA: Diagnosis not present

## 2020-12-21 DIAGNOSIS — M25551 Pain in right hip: Secondary | ICD-10-CM | POA: Diagnosis not present

## 2020-12-21 DIAGNOSIS — M25511 Pain in right shoulder: Secondary | ICD-10-CM | POA: Diagnosis not present

## 2020-12-21 DIAGNOSIS — M25512 Pain in left shoulder: Secondary | ICD-10-CM | POA: Diagnosis not present

## 2020-12-25 DIAGNOSIS — M25511 Pain in right shoulder: Secondary | ICD-10-CM | POA: Diagnosis not present

## 2020-12-25 DIAGNOSIS — M25551 Pain in right hip: Secondary | ICD-10-CM | POA: Diagnosis not present

## 2020-12-25 DIAGNOSIS — M25512 Pain in left shoulder: Secondary | ICD-10-CM | POA: Diagnosis not present

## 2020-12-28 DIAGNOSIS — M25511 Pain in right shoulder: Secondary | ICD-10-CM | POA: Diagnosis not present

## 2020-12-28 DIAGNOSIS — M25512 Pain in left shoulder: Secondary | ICD-10-CM | POA: Diagnosis not present

## 2020-12-28 DIAGNOSIS — M25551 Pain in right hip: Secondary | ICD-10-CM | POA: Diagnosis not present

## 2021-01-01 DIAGNOSIS — M25511 Pain in right shoulder: Secondary | ICD-10-CM | POA: Diagnosis not present

## 2021-01-01 DIAGNOSIS — M25512 Pain in left shoulder: Secondary | ICD-10-CM | POA: Diagnosis not present

## 2021-01-01 DIAGNOSIS — M25551 Pain in right hip: Secondary | ICD-10-CM | POA: Diagnosis not present

## 2021-01-04 DIAGNOSIS — M25511 Pain in right shoulder: Secondary | ICD-10-CM | POA: Diagnosis not present

## 2021-01-04 DIAGNOSIS — M25551 Pain in right hip: Secondary | ICD-10-CM | POA: Diagnosis not present

## 2021-01-04 DIAGNOSIS — M25512 Pain in left shoulder: Secondary | ICD-10-CM | POA: Diagnosis not present

## 2021-01-08 DIAGNOSIS — M25512 Pain in left shoulder: Secondary | ICD-10-CM | POA: Diagnosis not present

## 2021-01-08 DIAGNOSIS — M25511 Pain in right shoulder: Secondary | ICD-10-CM | POA: Diagnosis not present

## 2021-01-08 DIAGNOSIS — M25551 Pain in right hip: Secondary | ICD-10-CM | POA: Diagnosis not present

## 2021-01-09 DIAGNOSIS — M25551 Pain in right hip: Secondary | ICD-10-CM | POA: Diagnosis not present

## 2021-01-09 DIAGNOSIS — M25511 Pain in right shoulder: Secondary | ICD-10-CM | POA: Diagnosis not present

## 2021-01-09 DIAGNOSIS — M25512 Pain in left shoulder: Secondary | ICD-10-CM | POA: Diagnosis not present

## 2021-01-15 DIAGNOSIS — M25511 Pain in right shoulder: Secondary | ICD-10-CM | POA: Diagnosis not present

## 2021-01-15 DIAGNOSIS — M25551 Pain in right hip: Secondary | ICD-10-CM | POA: Diagnosis not present

## 2021-01-15 DIAGNOSIS — M25512 Pain in left shoulder: Secondary | ICD-10-CM | POA: Diagnosis not present

## 2021-01-18 DIAGNOSIS — M25551 Pain in right hip: Secondary | ICD-10-CM | POA: Diagnosis not present

## 2021-01-18 DIAGNOSIS — M25511 Pain in right shoulder: Secondary | ICD-10-CM | POA: Diagnosis not present

## 2021-01-18 DIAGNOSIS — M25512 Pain in left shoulder: Secondary | ICD-10-CM | POA: Diagnosis not present

## 2021-01-22 DIAGNOSIS — M25551 Pain in right hip: Secondary | ICD-10-CM | POA: Diagnosis not present

## 2021-01-22 DIAGNOSIS — M25512 Pain in left shoulder: Secondary | ICD-10-CM | POA: Diagnosis not present

## 2021-01-22 DIAGNOSIS — M25511 Pain in right shoulder: Secondary | ICD-10-CM | POA: Diagnosis not present

## 2021-01-25 DIAGNOSIS — M25551 Pain in right hip: Secondary | ICD-10-CM | POA: Diagnosis not present

## 2021-01-25 DIAGNOSIS — M25512 Pain in left shoulder: Secondary | ICD-10-CM | POA: Diagnosis not present

## 2021-01-25 DIAGNOSIS — M25511 Pain in right shoulder: Secondary | ICD-10-CM | POA: Diagnosis not present

## 2021-01-29 DIAGNOSIS — M25511 Pain in right shoulder: Secondary | ICD-10-CM | POA: Diagnosis not present

## 2021-01-29 DIAGNOSIS — M25512 Pain in left shoulder: Secondary | ICD-10-CM | POA: Diagnosis not present

## 2021-01-29 DIAGNOSIS — M25551 Pain in right hip: Secondary | ICD-10-CM | POA: Diagnosis not present

## 2021-01-30 DIAGNOSIS — M65332 Trigger finger, left middle finger: Secondary | ICD-10-CM | POA: Diagnosis not present

## 2021-01-30 DIAGNOSIS — M180 Bilateral primary osteoarthritis of first carpometacarpal joints: Secondary | ICD-10-CM | POA: Diagnosis not present

## 2021-01-30 DIAGNOSIS — S63641A Sprain of metacarpophalangeal joint of right thumb, initial encounter: Secondary | ICD-10-CM | POA: Diagnosis not present

## 2021-01-31 ENCOUNTER — Ambulatory Visit: Payer: Medicare Other

## 2021-01-31 ENCOUNTER — Ambulatory Visit: Payer: Medicare Other | Admitting: Nurse Practitioner

## 2021-02-01 ENCOUNTER — Other Ambulatory Visit: Payer: Self-pay | Admitting: Orthopedic Surgery

## 2021-02-01 DIAGNOSIS — S63641A Sprain of metacarpophalangeal joint of right thumb, initial encounter: Secondary | ICD-10-CM

## 2021-02-04 ENCOUNTER — Ambulatory Visit (INDEPENDENT_AMBULATORY_CARE_PROVIDER_SITE_OTHER): Payer: Medicare Other

## 2021-02-04 ENCOUNTER — Telehealth: Payer: Medicare Other

## 2021-02-04 DIAGNOSIS — R7303 Prediabetes: Secondary | ICD-10-CM

## 2021-02-04 DIAGNOSIS — E782 Mixed hyperlipidemia: Secondary | ICD-10-CM | POA: Diagnosis not present

## 2021-02-04 DIAGNOSIS — N1831 Chronic kidney disease, stage 3a: Secondary | ICD-10-CM | POA: Diagnosis not present

## 2021-02-04 DIAGNOSIS — I10 Essential (primary) hypertension: Secondary | ICD-10-CM

## 2021-02-05 DIAGNOSIS — I129 Hypertensive chronic kidney disease with stage 1 through stage 4 chronic kidney disease, or unspecified chronic kidney disease: Secondary | ICD-10-CM | POA: Diagnosis not present

## 2021-02-05 DIAGNOSIS — M549 Dorsalgia, unspecified: Secondary | ICD-10-CM | POA: Diagnosis not present

## 2021-02-05 DIAGNOSIS — N1831 Chronic kidney disease, stage 3a: Secondary | ICD-10-CM | POA: Diagnosis not present

## 2021-02-05 DIAGNOSIS — Z720 Tobacco use: Secondary | ICD-10-CM | POA: Diagnosis not present

## 2021-02-05 DIAGNOSIS — E1122 Type 2 diabetes mellitus with diabetic chronic kidney disease: Secondary | ICD-10-CM | POA: Diagnosis not present

## 2021-02-05 DIAGNOSIS — G8929 Other chronic pain: Secondary | ICD-10-CM | POA: Diagnosis not present

## 2021-02-06 DIAGNOSIS — M25512 Pain in left shoulder: Secondary | ICD-10-CM | POA: Diagnosis not present

## 2021-02-06 DIAGNOSIS — M25511 Pain in right shoulder: Secondary | ICD-10-CM | POA: Diagnosis not present

## 2021-02-06 DIAGNOSIS — M25551 Pain in right hip: Secondary | ICD-10-CM | POA: Diagnosis not present

## 2021-02-08 DIAGNOSIS — M25511 Pain in right shoulder: Secondary | ICD-10-CM | POA: Diagnosis not present

## 2021-02-08 DIAGNOSIS — M25512 Pain in left shoulder: Secondary | ICD-10-CM | POA: Diagnosis not present

## 2021-02-08 DIAGNOSIS — M25551 Pain in right hip: Secondary | ICD-10-CM | POA: Diagnosis not present

## 2021-02-12 DIAGNOSIS — M25512 Pain in left shoulder: Secondary | ICD-10-CM | POA: Diagnosis not present

## 2021-02-12 DIAGNOSIS — M25551 Pain in right hip: Secondary | ICD-10-CM | POA: Diagnosis not present

## 2021-02-12 DIAGNOSIS — M25511 Pain in right shoulder: Secondary | ICD-10-CM | POA: Diagnosis not present

## 2021-02-13 DIAGNOSIS — N1831 Chronic kidney disease, stage 3a: Secondary | ICD-10-CM | POA: Diagnosis not present

## 2021-02-14 ENCOUNTER — Telehealth: Payer: Self-pay | Admitting: Nurse Practitioner

## 2021-02-14 NOTE — Telephone Encounter (Signed)
Left message for patient to call back and schedule Medicare Annual Wellness Visit (AWV) either virtually or in office.   Last AWV 01/25/20 please schedule at anytime with [facility]    This should be a 45 minute visit.

## 2021-02-19 DIAGNOSIS — M25551 Pain in right hip: Secondary | ICD-10-CM | POA: Diagnosis not present

## 2021-02-19 DIAGNOSIS — M25512 Pain in left shoulder: Secondary | ICD-10-CM | POA: Diagnosis not present

## 2021-02-19 DIAGNOSIS — M25511 Pain in right shoulder: Secondary | ICD-10-CM | POA: Diagnosis not present

## 2021-02-19 NOTE — Patient Instructions (Signed)
Goals Addressed      Other   .  Mixed Hyperlipidemia - disease progression minimized or prevented   On track     Timeframe:  Long-Range Goal Priority:  High Start Date:  02/04/21                           Expected End Date: 08/06/21      Next Follow Up date: 05/06/21           Self Care Activities:  . Continue to keep all scheduled follow up appointments . Take medications as directed  . Let your healthcare team know if you are unable to take your medications . Call your pharmacy for refills at least 7 days prior to running out of medication . Increase your water intake unless otherwise directed . Review patient educational materials related to Hyperlipidemia  Patient Goals: - to lower Cholesterol by eating better and exercising             .  Monitor and Manage My Blood Sugar-Diabetes Type 2   On track     Timeframe:  Long-Range Goal Priority:  Medium Start Date: 02/04/21                            Expected End Date: 08/06/21                      Follow Up Date: 05/06/21   - check blood sugar at prescribed times - check blood sugar if I feel it is too high or too low - enter blood sugar readings and medication or insulin into daily log - take the blood sugar log to all doctor visits - take the blood sugar meter to all doctor visits    Why is this important?    Checking your blood sugar at home helps to keep it from getting very high or very low.   Writing the results in a diary or log helps the doctor know how to care for you.   Your blood sugar log should have the time, date and the results.   Also, write down the amount of insulin or other medicine that you take.   Other information, like what you ate, exercise done and how you were feeling, will also be helpful.     Notes:     .  Obtain Eye Exam-Diabetes Type 2   On track     Timeframe:  Long-Range Goal Priority:  Medium Start Date: 02/04/21                         Expected End Date:  08/06/21                       Follow Up Date: 05/06/21    - keep appointment with eye doctor - schedule appointment with eye doctor    Why is this important?    Eye check-ups are important when you have diabetes.   Vision loss can be prevented.    Notes:     .  Osteoarthritis - Maintain Mobility and Function   On track     Timeframe:  Long-Range Goal Priority:  High Start Date:  02/04/21  Expected End Date:  08/06/21      Next Scheduled Follow Up date: 05/06/21     Self Care Activities:  . Continue to keep all scheduled follow up appointments . Take medications as directed  . Let your healthcare team know if you are unable to take your medications . Call your pharmacy for refills at least 7 days prior to running out of medication Patient Goals: - to keep pain under control                  .  Perform Foot Care-Diabetes Type 2   On track     Timeframe:  Long-Range Goal Priority:  Medium Start Date:  02/04/21                           Expected End Date: 08/06/21                     Follow Up Date: 05/06/21   - check feet daily for cuts, sores or redness - keep feet up while sitting - trim toenails straight across - wash and dry feet carefully every day - wear comfortable, cotton socks - wear comfortable, well-fitting shoes    Why is this important?    Good foot care is very important when you have diabetes.   There are many things you can do to keep your feet healthy and catch a problem early.    Notes:

## 2021-02-19 NOTE — Chronic Care Management (AMB) (Signed)
Chronic Care Management   CCM RN Visit Note  02/04/2021 Name: Margaret Webster MRN: [id number] DOB: October 12, 1961  Subjective: Margaret Webster is a 60 y.o. year old female who is a primary care patient of [name], [place]. The care management team was consulted for assistance with disease management and care coordination needs.    Engaged with patient by telephone for follow up visit in response to provider referral for case management and/or care coordination services.   Consent to Services:  The patient was given information about Chronic Care Management services, agreed to services, and gave verbal consent prior to initiation of services.  Please see initial visit note for detailed documentation.   Patient agreed to services and verbal consent obtained.   Assessment: Review of patient past medical history, allergies, medications, health status, including review of consultants reports, laboratory and other test data, was performed as part of comprehensive evaluation and provision of chronic care management services.   SDOH (Social Determinants of Health) assessments and interventions performed:  Yes, no acute needs identified   CCM Care Plan  Allergies  Allergen Reactions  . Sulfa Antibiotics Other (See Comments)    Hard to breathe  . Crestor [Rosuvastatin] Other (See Comments)    Makes her mouth numb and causes side pain  . Ibuprofen Other (See Comments)    Stomach upset  . Polytrim [Polymyxin B-Trimethoprim] Itching and Swelling    redness  . Influenza Vaccines Hives  . Lipitor [Atorvastatin Calcium] Other (See Comments)    Muscle ache   . Naproxen Nausea And Vomiting    Outpatient Encounter Medications as of 02/04/2021  Medication Sig  . amLODipine (NORVASC) 2.5 MG tablet Take 1 tablet (2.5 mg total) by mouth daily.  Marland Kitchen aspirin EC 81 MG tablet Take 81 mg by mouth once.  . metFORMIN (GLUCOPHAGE-XR) 500 MG 24 hr tablet Take 1 tablet by mouth once daily with breakfast  .  NEXLETOL 180 MG TABS TAKE 1 TABLET BY MOUTH AT BEDTIME  . nystatin cream (MYCOSTATIN) Apply 1 application topically 2 (two) times daily.  Marland Kitchen nystatin-triamcinolone ointment (MYCOLOG) Apply 1 application topically 2 (two) times daily.  Marland Kitchen triamcinolone cream (KENALOG) 0.5 % Apply 1 application topically 2 (two) times daily.  Marland Kitchen UNABLE TO FIND Hair,skin and nails : one tablet daily   No facility-administered encounter medications on file as of 02/04/2021.    Patient Active Problem List   Diagnosis Date Noted  . Depression 07/05/2020  . Ringing in ears 07/05/2020  . CVA (cerebral vascular accident) ([place]) 02/26/2020  . CKD (chronic kidney disease) stage 3, GFR 30-59 ml/min (HCC)   . Bradycardia   . Elevated TSH   . Multiple joint pain 11/07/2019  . Acute pain of right shoulder 09/19/2019  . Right hip pain 09/19/2019  . Prediabetes 09/08/2018  . Mass of joint of finger 07/26/2015  . Chest pain 01/10/2015  . DM (diabetes mellitus) ([place]) 01/10/2015  . Hypertension 01/10/2015  . Hypothyroidism 01/10/2015  . Headache 01/10/2015  . Abdominal pain 01/10/2015  . Vaginitis 04/07/2014  . Pain 04/07/2014  . Vaginal irritation 04/07/2014  . Boil of buttock 04/06/2014  . BV (bacterial vaginosis) 04/06/2014  . Unspecified symptom associated with female genital organs 11/16/2013  . Candidiasis of vulva and vagina 11/16/2013  . Vaginitis and vulvovaginitis, unspecified 06/07/2013  . Pain aggravated by activities of daily living 06/07/2013  . S/P cervical spinal fusion 01/17/2013  . Cervical radiculopathy 12/13/2012  . Carpal tunnel syndrome on both sides  12/06/2012  . Cervical spondylosis with myelopathy 12/06/2012  . Nonspecific abnormal finding in stool contents 12/02/2011  . Diabetes mellitus without mention of complication 67/89/3810  . Chronic back pain     Conditions to be addressed/monitored:Essential hypertension, Stage 3a chronic kidney disease, Mixed hyperlipidemia,  Prediabetes  Care Plan : Diabetes Type 2 (Adult)  Updates made by Lynne Logan, RN since 02/19/2021 12:00 AM    Problem: Glycemic Management (Diabetes, Type 2)   Priority: High    Long-Range Goal: Glycemic Management Optimized   Start Date: 02/04/2021  Expected End Date: 08/06/2021  This Visit's Progress: On track  Priority: Medium  Note:   Objective:  Lab Results  Component Value Date   HGBA1C 6.2 (H) 10/25/2020 .   Lab Results  Component Value Date   CREATININE 1.19 (H) 10/25/2020   CREATININE 1.06 (H) 06/06/2020   CREATININE 1.01 (H) 02/26/2020 .   Marland Kitchen No results found for: EGFR Current Barriers:  Marland Kitchen Knowledge Deficits related to basic Diabetes pathophysiology and self care/management . Knowledge Deficits related to medications used for management of diabetes Case Manager Clinical Goal(s):  . patient will demonstrate improved adherence to prescribed treatment plan for diabetes self care/management as evidenced by: daily monitoring and recording of CBG  adherence to ADA/ carb modified diet exercise 5 days/week adherence to prescribed medication regimen contacting provider for new or worsened symptoms or questions Interventions:  . Collaboration with [name], FNP regarding development and update of comprehensive plan of care as evidenced by provider attestation and co-signature . Inter-disciplinary care team collaboration (see longitudinal plan of care) . Provided education to patient about basic DM disease process . Review of patient status, including review of consultants reports, relevant laboratory and other test results, and medications completed. . Educated patient on education and dietary recommendations; daily glycemic control, FBS 80-130, <180 after meals; 15'15' rule  . Reviewed medications with patient and discussed importance of medication adherence . Provided patient with written educational materials related to hypo and hyperglycemia and importance of correct  treatment . Advised patient, providing education and rationale, to check cbg daily before meals and at bedtime and record, calling the PCP and or CM RN for findings outside established parameters.   . Discussed plans with patient for ongoing care management follow up and provided patient with direct contact information for care management team Self-Care Activities - Self administers oral medications as prescribed Attends all scheduled provider appointments Checks blood sugars as prescribed and utilize hyper and hypoglycemia protocol as needed Adheres to prescribed ADA/carb modified Patient Goals: - check blood sugar at prescribed times - check blood sugar if I feel it is too high or too low - enter blood sugar readings and medication or insulin into daily log - take the blood sugar log to all doctor visits - take the blood sugar meter to all doctor visits - keep appointment with eye doctor - schedule appointment with eye doctor - check feet daily for cuts, sores or redness - keep feet up while sitting - trim toenails straight across - wash and dry feet carefully every day - wear comfortable, cotton socks - wear comfortable, well-fitting shoes  Follow Up Plan: Telephone follow up appointment with care management team member scheduled for: 05/06/21   Care Plan : Mixed Hyperlipidemia  Updates made by Lynne Logan, RN since 02/19/2021 12:00 AM    Problem: Mixed Hyperlipidemia   Priority: High    Long-Range Goal: Mixed Hyperlipidemia - disease progression minimized or prevented  Start Date: 02/04/2021  Expected End Date: 08/06/2021  This Visit's Progress: On track  Priority: High  Note:   Current Barriers:   Ineffective Self Health Maintenance  Clinical Goal(s):  Marland Kitchen Collaboration with [name], FNP regarding development and update of comprehensive plan of care as evidenced by provider attestation and co-signature . Inter-disciplinary care team collaboration (see longitudinal  plan of care)  patient will work with care management team to address care coordination and chronic disease management needs related to Disease Management  Educational Needs  Care Coordination  Medication Management and Education  Psychosocial Support   Interventions:   Evaluation of current treatment plan related to  Mixed Hyperlipidemia , self-management and patient's adherence to plan as established by provider.  Collaboration with [name], FNP regarding development and update of comprehensive plan of care as evidenced by provider attestation       and co-signature  Inter-disciplinary care team collaboration (see longitudinal plan of care) . Provided education to patient about basic disease process related to Mixed Hyperlipidemia  . Review of patient status, including review of consultants reports, relevant laboratory and other test results, and medications completed. . Educated patient on dietary and exercise recommendations  . Reviewed medications with patient and discussed importance of medication adherence . Reviewed scheduled/upcoming provider appointments including: next PCP follow up appointment scheduled for 02/25/21 $RemoveBe'@9'[id number]$ :00 AM . Mailed printed educational material related to Hyperlipidemia   Discussed plans with patient for ongoing care management follow up and provided patient with direct contact information for care management team Self Care Activities:  . Continue to keep all scheduled follow up appointments . Take medications as directed  . Let your healthcare team know if you are unable to take your medications . Call your pharmacy for refills at least 7 days prior to running out of medication . Increase your water intake unless otherwise directed . Review patient educational materials related to Hyperlipidemia  Patient Goals: - to lower Cholesterol by eating better and exercising   Follow Up Plan: Telephone follow up appointment with care management team  member scheduled for: 05/06/21   Care Plan : Osteoarthritis (Adult)  Updates made by Lynne Logan, RN since 02/19/2021 12:00 AM    Problem: Mobility and Function (Osteoarthritis)   Priority: High    Long-Range Goal: Maintain Mobility and Function   Start Date: 02/04/2021  Expected End Date: 08/06/2021  This Visit's Progress: On track  Priority: High  Note:   Current Barriers:   Ineffective Self Health Maintenance  Clinical Goal(s):  Marland Kitchen Collaboration with [name], FNP regarding development and update of comprehensive plan of care as evidenced by provider attestation and co-signature . Inter-disciplinary care team collaboration (see longitudinal plan of care)  patient will work with care management team to address care coordination and chronic disease management needs related to Disease Management  Educational Needs  Care Coordination  Medication Management and Education  Psychosocial Support   Interventions:   Evaluation of current treatment plan related to  Osteoarthritis , self-management and patient's adherence to plan as established by provider.  Collaboration with [name], FNP regarding development and update of comprehensive plan of care as evidenced by provider attestation       and co-signature  Inter-disciplinary care team collaboration (see longitudinal plan of care) . Provided education to patient about basic disease process related to Osteoarthritis . Review of patient status, including review of consultants reports, relevant laboratory and other test results, and medications completed. . Determined patient was  referred for MRI scan for dx: rupture of ulnar collateral ligament of right thumb . Discussed referral for PT for chronic left shoulder pain and right hip pain  . Reviewed medications with patient and discussed importance of medication adherence . Reviewed scheduled/upcoming provider appointments including: next PCP follow up appointment  scheduled for 02/25/21 $RemoveBe'@9'[id number]$ :00 AM   Discussed plans with patient for ongoing care management follow up and provided patient with direct contact information for care management team Self Care Activities:  . Continue to keep all scheduled follow up appointments . Take medications as directed  . Let your healthcare team know if you are unable to take your medications . Call your pharmacy for refills at least 7 days prior to running out of medication . Increase your water intake unless otherwise directed Patient Goals: - to keep pain under control   Follow Up Plan: Telephone follow up appointment with care management team member scheduled for: 05/06/21    Plan:Telephone follow up appointment with care management team member scheduled for:  05/06/21  Barb Merino, RN, BSN, CCM Care Management Coordinator [facility]  Direct Phone: [phone]

## 2021-02-25 ENCOUNTER — Ambulatory Visit: Payer: Medicare Other | Admitting: Nurse Practitioner

## 2021-02-26 ENCOUNTER — Other Ambulatory Visit: Payer: Self-pay

## 2021-02-26 ENCOUNTER — Ambulatory Visit
Admission: RE | Admit: 2021-02-26 | Discharge: 2021-02-26 | Disposition: A | Discharge status: Home / Self Care | Payer: Medicare Other | Source: Ambulatory Visit | Attending: Orthopedic Surgery | Admitting: Orthopedic Surgery

## 2021-02-26 DIAGNOSIS — G8929 Other chronic pain: Secondary | ICD-10-CM | POA: Diagnosis not present

## 2021-02-26 DIAGNOSIS — S63641A Sprain of metacarpophalangeal joint of right thumb, initial encounter: Secondary | ICD-10-CM

## 2021-02-26 DIAGNOSIS — M79644 Pain in right finger(s): Secondary | ICD-10-CM | POA: Diagnosis not present

## 2021-02-26 DIAGNOSIS — M25641 Stiffness of right hand, not elsewhere classified: Secondary | ICD-10-CM | POA: Diagnosis not present

## 2021-02-26 MED ORDER — IOPAMIDOL (ISOVUE-M 200) INJECTION 41%
1.0000 mL | Freq: Once | INTRAMUSCULAR | Status: AC
  Administered 2021-02-26: 1 mL via INTRA_ARTICULAR

## 2021-03-05 ENCOUNTER — Other Ambulatory Visit: Payer: Self-pay | Admitting: Orthopedic Surgery

## 2021-03-05 DIAGNOSIS — S63641A Sprain of metacarpophalangeal joint of right thumb, initial encounter: Secondary | ICD-10-CM | POA: Diagnosis not present

## 2021-03-11 ENCOUNTER — Telehealth: Payer: Self-pay

## 2021-03-11 NOTE — Telephone Encounter (Signed)
I left the pt a message that moore, DNP, FNP-BC needed the pt to come in for evaluation of surgical clearance, it's been 1 year since the pt's last ekg.

## 2021-03-12 ENCOUNTER — Other Ambulatory Visit: Payer: Medicare Other

## 2021-03-12 ENCOUNTER — Other Ambulatory Visit: Payer: Self-pay

## 2021-03-12 DIAGNOSIS — I10 Essential (primary) hypertension: Secondary | ICD-10-CM

## 2021-03-12 DIAGNOSIS — Z01818 Encounter for other preprocedural examination: Secondary | ICD-10-CM | POA: Diagnosis not present

## 2021-03-12 DIAGNOSIS — E782 Mixed hyperlipidemia: Secondary | ICD-10-CM | POA: Diagnosis not present

## 2021-03-12 DIAGNOSIS — R7303 Prediabetes: Secondary | ICD-10-CM

## 2021-03-13 LAB — CBC
Hematocrit: 41.7 % (ref 34.0–46.6)
Hemoglobin: 14.2 g/dL (ref 11.1–15.9)
MCH: 31.4 pg (ref 26.6–33.0)
MCHC: 34.1 g/dL (ref 31.5–35.7)
MCV: 92 fL (ref 79–97)
Platelets: 271 10*3/uL (ref 150–450)
RBC: 4.52 x10E6/uL (ref 3.77–5.28)
RDW: 12.7 % (ref 11.7–15.4)
WBC: 4.8 10*3/uL (ref 3.4–10.8)

## 2021-03-13 LAB — LIPID PANEL
Chol/HDL Ratio: 4.6 ratio — ABNORMAL HIGH (ref 0.0–4.4)
Cholesterol, Total: 250 mg/dL — ABNORMAL HIGH (ref 100–199)
HDL: 54 mg/dL (ref >39)
LDL Chol Calc (NIH): 170 mg/dL — ABNORMAL HIGH (ref 0–99)
Triglycerides: 146 mg/dL (ref 0–149)
VLDL Cholesterol Cal: 26 mg/dL (ref 5–40)

## 2021-03-13 LAB — CMP14+EGFR
ALT: 11 IU/L (ref 0–32)
AST: 21 IU/L (ref 0–40)
Albumin/Globulin Ratio: 1.5 (ref 1.2–2.2)
Albumin: 4.6 g/dL (ref 3.8–4.8)
Alkaline Phosphatase: 62 IU/L (ref 44–121)
BUN/Creatinine Ratio: 17 (ref 12–28)
BUN: 21 mg/dL (ref 8–27)
Bilirubin Total: 0.5 mg/dL (ref 0.0–1.2)
CO2: 22 mmol/L (ref 20–29)
Calcium: 9.4 mg/dL (ref 8.7–10.3)
Chloride: 104 mmol/L (ref 96–106)
Creatinine, Ser: 1.22 mg/dL — ABNORMAL HIGH (ref 0.57–1.00)
Globulin, Total: 3 g/dL (ref 1.5–4.5)
Glucose: 88 mg/dL (ref 65–99)
Potassium: 4.4 mmol/L (ref 3.5–5.2)
Sodium: 141 mmol/L (ref 134–144)
Total Protein: 7.6 g/dL (ref 6.0–8.5)
eGFR: 50 mL/min/{1.73_m2} — ABNORMAL LOW (ref >59)

## 2021-03-13 LAB — HEMOGLOBIN A1C
Est. average glucose Bld gHb Est-mCnc: 126 mg/dL
Hgb A1c MFr Bld: 6 % — ABNORMAL HIGH (ref 4.8–5.6)

## 2021-03-13 LAB — PROTIME-INR
INR: 1 (ref 0.9–1.2)
Prothrombin Time: 10.7 s (ref 9.1–12.0)

## 2021-03-13 NOTE — Progress Notes (Addendum)
Left message for Hassan Rowan at [facility] in regards to surgical clearance and aspirin orders for surgery next week 03/21/21.

## 2021-03-14 ENCOUNTER — Ambulatory Visit (INDEPENDENT_AMBULATORY_CARE_PROVIDER_SITE_OTHER): Payer: Medicare Other | Admitting: Nurse Practitioner

## 2021-03-14 ENCOUNTER — Encounter (HOSPITAL_BASED_OUTPATIENT_CLINIC_OR_DEPARTMENT_OTHER): Payer: Self-pay | Admitting: Orthopedic Surgery

## 2021-03-14 ENCOUNTER — Encounter: Payer: Self-pay | Admitting: Nurse Practitioner

## 2021-03-14 ENCOUNTER — Other Ambulatory Visit: Payer: Self-pay

## 2021-03-14 VITALS — BP 122/80 | HR 83 | Temp 98.4°F | Ht 67.0 in | Wt 223.6 lb

## 2021-03-14 DIAGNOSIS — R7303 Prediabetes: Secondary | ICD-10-CM

## 2021-03-14 DIAGNOSIS — Z01818 Encounter for other preprocedural examination: Secondary | ICD-10-CM

## 2021-03-14 DIAGNOSIS — I10 Essential (primary) hypertension: Secondary | ICD-10-CM

## 2021-03-14 NOTE — Progress Notes (Signed)
I,Yamilka Roman [employer] as a Education administrator for [employer], FNP.,have documented all relevant documentation on the behalf of [name], FNP,as directed by  [name], FNP while in the presence of [name], [place]. This visit occurred during the SARS-CoV-2 public health emergency.  Safety protocols were in place, including screening questions prior to the visit, additional usage of staff PPE, and extensive cleaning of exam room while observing appropriate contact time as indicated for disinfecting solutions.  Subjective:     Patient ID: Margaret Webster , female    DOB: [birth year] , 60 y.o.   MRN: [id number]   Chief Complaint  Patient presents with   Pre-op Exam   Prediabetes   Hypertension    HPI  Patient presents today for a pre op exam. She is scheduled to have surgery on the 26th of may. She is to have a covid test prior to getting her surgery.    Hypertension This is a chronic problem. The current episode started more than 1 year ago. The problem is unchanged. Condition status: no blood pressure this visit due to being virtual. Pertinent negatives include no chest pain, headaches or palpitations. There are no associated agents to hypertension. Risk factors for coronary artery disease include sedentary lifestyle. There are no compliance problems.  There is no history of angina. There is no history of chronic renal disease.  Diabetes She presents for her follow-up diabetic visit. Diabetes type: prediabetes. Her disease course has been stable. There are no hypoglycemic associated symptoms. Pertinent negatives for hypoglycemia include no dizziness, headaches or nervousness/anxiousness. There are no diabetic associated symptoms. Pertinent negatives for diabetes include no chest pain, no fatigue, no polydipsia, no polyphagia and no polyuria. There are no hypoglycemic complications. There are no diabetic complications. Risk factors for coronary artery disease include sedentary lifestyle.  Current diabetic treatment includes diet. She is following a generally healthy diet. She has not had a previous visit with a dietitian. She rarely participates in exercise. She does not see a podiatrist.Eye exam is current (06/21/2020).    Past Medical History:  Diagnosis Date   Arthritis    bil shoulders   Bronchitis    Chronic back pain    Chronic kidney disease    Diabetes mellitus    stopped Mrtformin 1 mo ago, made her "sick"   Hypertension    Hypothyroidism    Stroke [facility])    Thyroid disease    Trigger thumb of right hand      Family History  Problem Relation Age of Onset   Diabetes Mother    Cystic fibrosis Sister    Cancer Sister        ?   Heart disease Maternal Grandmother    Clotting disorder Maternal Grandfather      Current Outpatient Medications:    aspirin EC 81 MG tablet, Take 81 mg by mouth once., Disp: , Rfl:    losartan (COZAAR) 25 MG tablet, Take 12.5 mg by mouth daily. With potassium, Disp: , Rfl:    metFORMIN (GLUCOPHAGE-XR) 500 MG 24 hr tablet, Take 1 tablet by mouth once daily with breakfast, Disp: 90 tablet, Rfl: 1   NEXLETOL 180 MG TABS, TAKE 1 TABLET BY MOUTH AT BEDTIME, Disp: 90 tablet, Rfl: 1   nystatin cream (MYCOSTATIN), Apply 1 application topically 2 (two) times daily., Disp: 30 g, Rfl: 0   nystatin-triamcinolone ointment (MYCOLOG), Apply 1 application topically 2 (two) times daily., Disp: 30 g, Rfl: 0   triamcinolone cream (KENALOG) 0.5 %,  Apply 1 application topically 2 (two) times daily., Disp: 30 g, Rfl: 0   UNABLE TO FIND, Hair,skin and nails : one tablet daily, Disp: , Rfl:    amLODipine (NORVASC) 2.5 MG tablet, Take 1 tablet (2.5 mg total) by mouth daily., Disp: 30 tablet, Rfl: 2   HYDROcodone-acetaminophen (NORCO) 5-325 MG tablet, 1-2 tabs po q6 hours prn pain, Disp: 20 tablet, Rfl: 0   oxyCODONE (OXY IR/ROXICODONE) 5 MG immediate release tablet, Take 1 tablet (5 mg total) by mouth every 4 (four) hours as needed for up to 7 days., Disp:  30 tablet, Rfl: 0   Allergies  Allergen Reactions   Sulfa Antibiotics Other (See Comments)    Hard to breathe   Crestor [Rosuvastatin] Other (See Comments)    Makes her mouth numb and causes side pain   Ibuprofen Other (See Comments)    Stomach upset   Polytrim [Polymyxin B-Trimethoprim] Itching and Swelling    redness   Influenza Vaccines Hives   Lipitor [Atorvastatin Calcium] Other (See Comments)    Muscle ache    Naproxen Nausea And Vomiting     Review of Systems  Constitutional:  Negative for fatigue.  Respiratory: Negative.    Cardiovascular:  Negative for chest pain, palpitations and leg swelling.  Endocrine: Negative for polydipsia, polyphagia and polyuria.  Musculoskeletal:  Positive for arthralgias (right shoulder - she has been going to PT at [facility]).  Neurological:  Negative for dizziness and headaches.  Psychiatric/Behavioral:  The patient is not nervous/anxious.     Today's Vitals   03/14/21 1529  BP: 122/80  Pulse: 83  Temp: 98.4 F (36.9 C)  Weight: 223 lb 9.6 oz (101.4 kg)  Height: 5\' 7"  (1.702 m)  PainSc: 0-No pain   Body mass index is 35.02 kg/m.   Objective:  Physical Exam Constitutional:      General: She is not in acute distress.    Appearance: Normal appearance. She is well-developed. She is obese.  Neck:     Thyroid: No thyromegaly.     Trachea: No tracheal deviation.  Cardiovascular:     Rate and Rhythm: Normal rate and regular rhythm.     Pulses: Normal pulses.     Heart sounds: Normal heart sounds. No murmur heard. Pulmonary:     Effort: Pulmonary effort is normal. No respiratory distress.     Breath sounds: Normal breath sounds. No wheezing.  Musculoskeletal:     Cervical back: Normal range of motion and neck supple.  Skin:    General: Skin is warm and dry.  Neurological:     General: No focal deficit present.     Mental Status: She is alert and oriented to person, place, and time.     Cranial Nerves: No cranial nerve  deficit.     Motor: No weakness.  Psychiatric:        Mood and Affect: Mood normal.        Behavior: Behavior normal.        Thought Content: Thought content normal.        Judgment: Judgment normal.        Assessment And Plan:     1. Essential hypertension Chronic, well controlled  2. Pre-op exam EKG done with NSR HR 62 Examined this patient, checked all appropriate lab work and tests and certify, that to the best of my knowledge, there is not a medical contraindications for undergoing surgery with a general and/or regional anesthesia. - EKG 12-Lead  3. Prediabetes Chronic,  stable Continue with current medications.   Patient was given opportunity to ask questions. Patient verbalized understanding of the plan and was able to repeat key elements of the plan. All questions were answered to their satisfaction.  [name], FNP   I, [name], FNP, have reviewed all documentation for this visit. The documentation on 04/08/21 for the exam, diagnosis, procedures, and orders are all accurate and complete.   IF YOU HAVE BEEN REFERRED TO A SPECIALIST, IT MAY TAKE 1-2 WEEKS TO SCHEDULE/PROCESS THE REFERRAL. IF YOU HAVE NOT HEARD FROM US/SPECIALIST IN TWO WEEKS, PLEASE GIVE [place] A CALL AT [phone].   THE PATIENT IS ENCOURAGED TO PRACTICE SOCIAL DISTANCING DUE TO THE COVID-19 PANDEMIC.

## 2021-03-18 NOTE — Progress Notes (Signed)

## 2021-03-21 ENCOUNTER — Ambulatory Visit (HOSPITAL_BASED_OUTPATIENT_CLINIC_OR_DEPARTMENT_OTHER): Payer: Medicare Other | Admitting: Anesthesiology

## 2021-03-21 ENCOUNTER — Encounter (HOSPITAL_BASED_OUTPATIENT_CLINIC_OR_DEPARTMENT_OTHER): Payer: Self-pay | Admitting: Orthopedic Surgery

## 2021-03-21 ENCOUNTER — Other Ambulatory Visit: Payer: Self-pay

## 2021-03-21 ENCOUNTER — Encounter (HOSPITAL_BASED_OUTPATIENT_CLINIC_OR_DEPARTMENT_OTHER)
Admission: RE | Disposition: A | Discharge status: Home / Self Care | Payer: Self-pay | Source: Home / Self Care | Attending: Orthopedic Surgery

## 2021-03-21 ENCOUNTER — Ambulatory Visit (HOSPITAL_BASED_OUTPATIENT_CLINIC_OR_DEPARTMENT_OTHER)
Admission: RE | Admit: 2021-03-21 | Discharge: 2021-03-21 | Disposition: A | Discharge status: Home / Self Care | Payer: Medicare Other | Attending: Orthopedic Surgery | Admitting: Orthopedic Surgery

## 2021-03-21 DIAGNOSIS — S53441A Ulnar collateral ligament sprain of right elbow, initial encounter: Secondary | ICD-10-CM | POA: Insufficient documentation

## 2021-03-21 DIAGNOSIS — Z881 Allergy status to other antibiotic agents status: Secondary | ICD-10-CM | POA: Insufficient documentation

## 2021-03-21 DIAGNOSIS — Z882 Allergy status to sulfonamides status: Secondary | ICD-10-CM | POA: Insufficient documentation

## 2021-03-21 DIAGNOSIS — E1122 Type 2 diabetes mellitus with diabetic chronic kidney disease: Secondary | ICD-10-CM | POA: Diagnosis not present

## 2021-03-21 DIAGNOSIS — Z888 Allergy status to other drugs, medicaments and biological substances status: Secondary | ICD-10-CM | POA: Insufficient documentation

## 2021-03-21 DIAGNOSIS — I129 Hypertensive chronic kidney disease with stage 1 through stage 4 chronic kidney disease, or unspecified chronic kidney disease: Secondary | ICD-10-CM | POA: Diagnosis not present

## 2021-03-21 DIAGNOSIS — Z887 Allergy status to serum and vaccine status: Secondary | ICD-10-CM | POA: Diagnosis not present

## 2021-03-21 DIAGNOSIS — S5331XA Traumatic rupture of right ulnar collateral ligament, initial encounter: Secondary | ICD-10-CM | POA: Diagnosis not present

## 2021-03-21 DIAGNOSIS — S63641A Sprain of metacarpophalangeal joint of right thumb, initial encounter: Secondary | ICD-10-CM | POA: Diagnosis not present

## 2021-03-21 DIAGNOSIS — F17210 Nicotine dependence, cigarettes, uncomplicated: Secondary | ICD-10-CM | POA: Insufficient documentation

## 2021-03-21 DIAGNOSIS — X58XXXA Exposure to other specified factors, initial encounter: Secondary | ICD-10-CM | POA: Insufficient documentation

## 2021-03-21 DIAGNOSIS — Z8673 Personal history of transient ischemic attack (TIA), and cerebral infarction without residual deficits: Secondary | ICD-10-CM | POA: Insufficient documentation

## 2021-03-21 DIAGNOSIS — N183 Chronic kidney disease, stage 3 unspecified: Secondary | ICD-10-CM | POA: Diagnosis not present

## 2021-03-21 DIAGNOSIS — Z79899 Other long term (current) drug therapy: Secondary | ICD-10-CM | POA: Diagnosis not present

## 2021-03-21 HISTORY — PX: ULNAR COLLATERAL LIGAMENT REPAIR: SHX6159

## 2021-03-21 HISTORY — DX: Chronic kidney disease, unspecified: N18.9

## 2021-03-21 LAB — GLUCOSE, CAPILLARY
Glucose-Capillary: 79 mg/dL (ref 70–99)
Glucose-Capillary: 77 mg/dL (ref 70–99)

## 2021-03-21 SURGERY — REPAIR, LIGAMENT, ULNAR COLLATERAL
Anesthesia: General | Site: Thumb | Laterality: Right

## 2021-03-21 MED ORDER — DEXAMETHASONE SODIUM PHOSPHATE 10 MG/ML IJ SOLN
INTRAMUSCULAR | Status: DC | PRN
  Administered 2021-03-21: 5 mg via INTRAVENOUS

## 2021-03-21 MED ORDER — PROPOFOL 10 MG/ML IV BOLUS
INTRAVENOUS | Status: DC | PRN
  Administered 2021-03-21: 200 mg via INTRAVENOUS

## 2021-03-21 MED ORDER — FENTANYL CITRATE (PF) 100 MCG/2ML IJ SOLN
INTRAMUSCULAR | Status: DC | PRN
  Administered 2021-03-21: 100 ug via INTRAVENOUS

## 2021-03-21 MED ORDER — OXYCODONE HCL 5 MG/5ML PO SOLN: 5.0000 mg | Freq: Once | ORAL | Status: DC | PRN

## 2021-03-21 MED ORDER — CEFAZOLIN SODIUM-DEXTROSE 2-3 GM-%(50ML) IV SOLR
INTRAVENOUS | Status: DC | PRN
  Administered 2021-03-21: 2 g via INTRAVENOUS

## 2021-03-21 MED ORDER — GLYCOPYRROLATE 0.2 MG/ML IJ SOLN
INTRAMUSCULAR | Status: DC | PRN
  Administered 2021-03-21: .2 mg via INTRAVENOUS

## 2021-03-21 MED ORDER — ONDANSETRON HCL 4 MG/2ML IJ SOLN: 4.0000 mg | Freq: Once | INTRAMUSCULAR | Status: DC | PRN

## 2021-03-21 MED ORDER — FENTANYL CITRATE (PF) 100 MCG/2ML IJ SOLN
INTRAMUSCULAR | Status: AC
  Filled 2021-03-21: qty 2

## 2021-03-21 MED ORDER — CEFAZOLIN SODIUM-DEXTROSE 2-4 GM/100ML-% IV SOLN
INTRAVENOUS | Status: AC
  Filled 2021-03-21: qty 100

## 2021-03-21 MED ORDER — OXYCODONE HCL 5 MG PO TABS
5.0000 mg | ORAL_TABLET | Freq: Once | ORAL | Status: DC | PRN
Start: 2021-03-21 — End: 2021-03-21

## 2021-03-21 MED ORDER — MEPERIDINE HCL 25 MG/ML IJ SOLN: 6.2500 mg | INTRAMUSCULAR | Status: DC | PRN

## 2021-03-21 MED ORDER — MIDAZOLAM HCL 2 MG/2ML IJ SOLN
INTRAMUSCULAR | Status: DC | PRN
  Administered 2021-03-21: 2 mg via INTRAVENOUS

## 2021-03-21 MED ORDER — ACETAMINOPHEN 325 MG PO TABS: 325.0000 mg | ORAL_TABLET | ORAL | Status: DC | PRN

## 2021-03-21 MED ORDER — FENTANYL CITRATE (PF) 100 MCG/2ML IJ SOLN
25.0000 ug | INTRAMUSCULAR | Status: DC | PRN
  Administered 2021-03-21: 25 ug via INTRAVENOUS
  Administered 2021-03-21: 50 ug via INTRAVENOUS

## 2021-03-21 MED ORDER — MIDAZOLAM HCL 2 MG/2ML IJ SOLN
INTRAMUSCULAR | Status: AC
  Filled 2021-03-21: qty 2

## 2021-03-21 MED ORDER — LACTATED RINGERS IV SOLN: INTRAVENOUS | Status: DC

## 2021-03-21 MED ORDER — ONDANSETRON HCL 4 MG/2ML IJ SOLN
INTRAMUSCULAR | Status: DC | PRN
  Administered 2021-03-21: 4 mg via INTRAVENOUS

## 2021-03-21 MED ORDER — DEXAMETHASONE SODIUM PHOSPHATE 10 MG/ML IJ SOLN
INTRAMUSCULAR | Status: AC
  Filled 2021-03-21: qty 1

## 2021-03-21 MED ORDER — ACETAMINOPHEN 160 MG/5ML PO SOLN
325.0000 mg | ORAL | Status: DC | PRN
Start: 2021-03-21 — End: 2021-03-21

## 2021-03-21 MED ORDER — CEFAZOLIN SODIUM-DEXTROSE 2-4 GM/100ML-% IV SOLN: 2.0000 g | INTRAVENOUS | Status: DC

## 2021-03-21 MED ORDER — BUPIVACAINE HCL (PF) 0.25 % IJ SOLN
INTRAMUSCULAR | Status: DC | PRN
  Administered 2021-03-21: 10 mL

## 2021-03-21 MED ORDER — HYDROCODONE-ACETAMINOPHEN 5-325 MG PO TABS: ORAL_TABLET | ORAL | 0 refills | Status: DC

## 2021-03-21 MED ORDER — LACTATED RINGERS IV SOLN: INTRAVENOUS | Status: DC | PRN

## 2021-03-21 SURGICAL SUPPLY — 78 items
ANCH SUT 2-0 MN NDL DRL PLSTR (Anchor) ×1 IMPLANT
ANCHOR JUGGERKNOT 1.0 1DR 2-0 (Anchor) ×1 IMPLANT
APL PRP STRL LF DISP 70% ISPRP (MISCELLANEOUS) ×1
BLADE MINI RND TIP GREEN BEAV (BLADE) ×2 IMPLANT
BLADE SURG 15 STRL LF DISP TIS (BLADE) ×2 IMPLANT
BLADE SURG 15 STRL SS (BLADE) ×4
BNDG CMPR 9X4 STRL LF SNTH (GAUZE/BANDAGES/DRESSINGS) ×1
BNDG ELASTIC 2X5.8 VLCR STR LF (GAUZE/BANDAGES/DRESSINGS) IMPLANT
BNDG ELASTIC 3X5.8 VLCR STR LF (GAUZE/BANDAGES/DRESSINGS) ×1 IMPLANT
BNDG ESMARK 4X9 LF (GAUZE/BANDAGES/DRESSINGS) ×2 IMPLANT
BNDG GAUZE ELAST 4 BULKY (GAUZE/BANDAGES/DRESSINGS) ×2 IMPLANT
CATH ROBINSON RED A/P 8FR (CATHETERS) IMPLANT
CHLORAPREP W/TINT 26 (MISCELLANEOUS) ×2 IMPLANT
CORD BIPOLAR FORCEPS 12FT (ELECTRODE) ×2 IMPLANT
COVER BACK TABLE 60X90IN (DRAPES) ×2 IMPLANT
COVER MAYO STAND STRL (DRAPES) ×2 IMPLANT
COVER WAND RF STERILE (DRAPES) IMPLANT
CUFF TOURN SGL QUICK 18X4 (TOURNIQUET CUFF) ×2 IMPLANT
DECANTER SPIKE VIAL GLASS SM (MISCELLANEOUS) IMPLANT
DRAPE EXTREMITY T 121X128X90 (DISPOSABLE) ×2 IMPLANT
DRAPE OEC MINIVIEW 54X84 (DRAPES) ×1 IMPLANT
DRAPE SURG 17X23 STRL (DRAPES) ×2 IMPLANT
DRSG PAD ABDOMINAL 8X10 ST (GAUZE/BANDAGES/DRESSINGS) IMPLANT
GAUZE SPONGE 4X4 12PLY STRL (GAUZE/BANDAGES/DRESSINGS) ×2 IMPLANT
GAUZE XEROFORM 1X8 LF (GAUZE/BANDAGES/DRESSINGS) ×2 IMPLANT
GLOVE SRG 8 PF TXTR STRL LF DI (GLOVE) ×1 IMPLANT
GLOVE SURG ENC MOIS LTX SZ7.5 (GLOVE) ×2 IMPLANT
GLOVE SURG ORTHO LTX SZ8 (GLOVE) ×1 IMPLANT
GLOVE SURG POLYISO LF SZ6.5 (GLOVE) ×2 IMPLANT
GLOVE SURG UNDER POLY LF SZ7 (GLOVE) ×3 IMPLANT
GLOVE SURG UNDER POLY LF SZ8 (GLOVE) ×2
GLOVE SURG UNDER POLY LF SZ8.5 (GLOVE) ×2 IMPLANT
GOWN STRL REUS W/ TWL LRG LVL3 (GOWN DISPOSABLE) ×1 IMPLANT
GOWN STRL REUS W/TWL LRG LVL3 (GOWN DISPOSABLE) ×4
GOWN STRL REUS W/TWL XL LVL3 (GOWN DISPOSABLE) ×3 IMPLANT
K-WIRE .035X4 (WIRE) IMPLANT
NDL HYPO 25X1 1.5 SAFETY (NEEDLE) IMPLANT
NDL KEITH (NEEDLE) IMPLANT
NDL SAFETY ECLIPSE 18X1.5 (NEEDLE) IMPLANT
NEEDLE HYPO 18GX1.5 SHARP (NEEDLE)
NEEDLE HYPO 22GX1.5 SAFETY (NEEDLE) IMPLANT
NEEDLE HYPO 25X1 1.5 SAFETY (NEEDLE) ×2 IMPLANT
NEEDLE KEITH (NEEDLE) IMPLANT
NS IRRIG 1000ML POUR BTL (IV SOLUTION) ×2 IMPLANT
PACK BASIN DAY SURGERY FS (CUSTOM PROCEDURE TRAY) ×2 IMPLANT
PAD CAST 3X4 CTTN HI CHSV (CAST SUPPLIES) ×1 IMPLANT
PAD CAST 4YDX4 CTTN HI CHSV (CAST SUPPLIES) IMPLANT
PADDING CAST ABS 4INX4YD NS (CAST SUPPLIES) ×1
PADDING CAST ABS COTTON 4X4 ST (CAST SUPPLIES) ×1 IMPLANT
PADDING CAST COTTON 3X4 STRL (CAST SUPPLIES) ×2
PADDING CAST COTTON 4X4 STRL (CAST SUPPLIES)
PASSER SUT SWANSON 36MM LOOP (INSTRUMENTS) IMPLANT
SLEEVE SCD COMPRESS KNEE MED (STOCKING) ×2 IMPLANT
SLING ARM FOAM STRAP LRG (SOFTGOODS) IMPLANT
SPLINT FAST PLASTER 5X30 (CAST SUPPLIES)
SPLINT PLASTER CAST FAST 5X30 (CAST SUPPLIES) IMPLANT
SPLINT PLASTER CAST XFAST 3X15 (CAST SUPPLIES) IMPLANT
SPLINT PLASTER XTRA FASTSET 3X (CAST SUPPLIES)
STOCKINETTE 4X48 STRL (DRAPES) ×2 IMPLANT
SUT CHROMIC 4 0 RB 1X27 (SUTURE) ×1 IMPLANT
SUT ETHIBOND 3-0 V-5 (SUTURE) IMPLANT
SUT ETHILON 3 0 PS 1 (SUTURE) IMPLANT
SUT ETHILON 4 0 PS 2 18 (SUTURE) ×1 IMPLANT
SUT FIBERWIRE 2-0 18 17.9 3/8 (SUTURE)
SUT FIBERWIRE 3-0 18 TAPR NDL (SUTURE)
SUT MERSILENE 2.0 SH NDLE (SUTURE) IMPLANT
SUT MERSILENE 4 0 P 3 (SUTURE) ×1 IMPLANT
SUT SILK 4 0 PS 2 (SUTURE) IMPLANT
SUT VIC AB 0 SH 27 (SUTURE) IMPLANT
SUT VIC AB 2-0 SH 27 (SUTURE)
SUT VIC AB 2-0 SH 27XBRD (SUTURE) IMPLANT
SUT VICRYL 4-0 PS2 18IN ABS (SUTURE) IMPLANT
SUTURE FIBERWR 2-0 18 17.9 3/8 (SUTURE) IMPLANT
SUTURE FIBERWR 3-0 18 TAPR NDL (SUTURE) IMPLANT
SYR BULB EAR ULCER 3OZ GRN STR (SYRINGE) ×2 IMPLANT
SYR CONTROL 10ML LL (SYRINGE) ×1 IMPLANT
TOWEL GREEN STERILE FF (TOWEL DISPOSABLE) ×4 IMPLANT
UNDERPAD 30X36 HEAVY ABSORB (UNDERPADS AND DIAPERS) ×2 IMPLANT

## 2021-03-21 NOTE — Discharge Instructions (Addendum)

## 2021-03-21 NOTE — Op Note (Signed)
I assisted Surgeon(s) and Role:    * Leanora Cover, MD - Primary    * Daryll Brod, MD - Assisting on the Procedure(s): RIGHT THUMB [place] on 03/21/2021.  I provided assistance on this case as follows: setup, approach, isolation collateral ligament, mobilization ligament, repair of ligament, closure of the incision and application of the dressing and spint.  Electronically signed by: Daryll Brod, MD Date: 03/21/2021 Time: 12:54 PM

## 2021-03-21 NOTE — Anesthesia Postprocedure Evaluation (Signed)
Anesthesia Post Note  Patient: Margaret Webster  Procedure(s) Performed: RIGHT THUMB ULNAR COLLATERAL LIGAMENT REPAIR (Right Thumb)     Anesthesia Type: General Anesthetic complications: no   No complications documented.  Last Vitals:  Vitals:   03/21/21 1330 03/21/21 1341  BP: (!) 161/89 (!) 163/51  Pulse: (!) 52 70  Resp: 17 16  Temp:  36.5 C  SpO2: 98% 97%    Last Pain:  Vitals:   03/21/21 1341  TempSrc:   PainSc: 1                  Makai Dumond

## 2021-03-21 NOTE — Anesthesia Preprocedure Evaluation (Addendum)
Anesthesia Evaluation  Patient identified by MRN, date of birth, ID band Patient awake    Reviewed: Allergy & Precautions, NPO status , Patient's Chart, lab work & pertinent test results  Airway Mallampati: II  TM Distance: >3 FB Neck ROM: Full    Dental no notable dental hx. (+) Dental Advisory Given   Pulmonary Current Smoker and Patient abstained from smoking.,    Pulmonary exam normal breath sounds clear to auscultation       Cardiovascular hypertension, Pt. on medications Normal cardiovascular exam Rhythm:Regular Rate:Normal     Neuro/Psych  Headaches, PSYCHIATRIC DISORDERS Depression CVA    GI/Hepatic negative GI ROS, Neg liver ROS,   Endo/Other  diabetesHypothyroidism   Renal/GU CRFRenal disease     Musculoskeletal  (+) Arthritis , Osteoarthritis,    Abdominal   Peds  Hematology negative hematology ROS (+)   Anesthesia Other Findings   Reproductive/Obstetrics negative OB ROS                             Anesthesia Physical  Anesthesia Plan  ASA: III  Anesthesia Plan: General   Post-op Pain Management:    Induction: Intravenous  PONV Risk Score and Plan: 1 and Ondansetron and Propofol infusion  Airway Management Planned: LMA  Additional Equipment: None  Intra-op Plan:   Post-operative Plan:   Informed Consent: I have reviewed the patients History and Physical, chart, labs and discussed the procedure including the risks, benefits and alternatives for the proposed anesthesia with the patient or authorized representative who has indicated his/her understanding and acceptance.     Dental advisory given  Plan Discussed with: CRNA and Anesthesiologist  Anesthesia Plan Comments:        Anesthesia Quick Evaluation

## 2021-03-21 NOTE — Op Note (Signed)
NAME: Margaret Webster MEDICAL RECORD NO: [id number] DATE OF BIRTH: 1961-05-16 FACILITY: [facility] LOCATION: [facility] PHYSICIAN: Tennis Must, MD   OPERATIVE REPORT   DATE OF PROCEDURE: 03/21/21    PREOPERATIVE DIAGNOSIS:   Right thumb ulnar collateral ligament tear   POSTOPERATIVE DIAGNOSIS:   Right thumb ulnar collateral ligament tear   PROCEDURE:   Repair of right thumb ulnar collateral ligament   SURGEON:  Leanora Cover, M.D.   ASSISTANT: Daryll Brod, MD   ANESTHESIA:  General   INTRAVENOUS FLUIDS:  Per anesthesia flow sheet.   ESTIMATED BLOOD LOSS:  Minimal.   COMPLICATIONS:  None.   SPECIMENS:  none   TOURNIQUET TIME:    Total Tourniquet Time Documented: Upper Arm (Right) - 25 minutes Total: Upper Arm (Right) - 25 minutes    DISPOSITION:  Stable to PACU.   INDICATIONS: 60 year old female who has had pain and instability of the ulnar collateral edema of the right thumb.  MRI shows tear of the collateral ligament.  She wishes to have surgical repair versus reconstruction. Risks, benefits and alternatives of surgery were discussed including the risks of blood loss, infection, damage to nerves, vessels, tendons, ligaments, bone for surgery, need for additional surgery, complications with wound healing, continued pain, stiffness.  She voiced understanding of these risks and elected to proceed.  OPERATIVE COURSE:  After being identified preoperatively by myself,  the patient and I agreed on the procedure and site of the procedure.  The surgical site was marked.  Surgical consent had been signed. She was given IV antibiotics as preoperative antibiotic prophylaxis. She was transferred to the operating room and placed on the operating table in supine position with the Right upper extremity on an arm board.  General anesthesia was induced by the anesthesiologist.  Right upper extremity was prepped and draped in normal sterile orthopedic fashion.  A surgical pause  was performed between the surgeons, anesthesia, and operating room staff and all were in agreement as to the patient, procedure, and site of procedure.  Tourniquet at the proximal aspect of the extremity was inflated to 250 mmHg after exsanguination of the arm with an Esmarch bandage.    Incision was made on the dorsum of the thumb over the MP joint.  This was carried into subcutaneous tissues by spreading technique.  Bipolar electrocautery was used to obtain hemostasis.  The abductor aponeurosis was identified and sharply incised.  The capsule was incised dorsally.  The ulnar collateral edema was identified.  It had torn from the insertion on the proximal phalanx.  The footprint was bare.  The proximal origin of the ligament was strong.  The ligament was carefully freed up and scar tissue removed from and sent.  The footprint on the proximal phalanx was roughed up using the house curette.  A juggernaut suture anchor was then placed into the footprint and the proximal phalanx of the thumb.  This was then used to repair the collateral ligament back down to the footprint.  Good repair was obtained.  Stressing the collateral ligament showed no instability after repair.  The wound was copiously irrigated with sterile saline.  The capsule was repaired with 4-0 chromic suture in a running fashion.  The abductor aponeurosis was repaired with a running 4-0 Mersilene suture.  Skin was closed with 4-0 nylon in a horizontal mattress fashion.  A digital block was performed with 10 cc of quarter percent plain Marcaine to aid in postoperative analgesia.  Wound was dressed  with sterile Xeroform 4 x 4's and wrapped with a Kerlix bandage.  A thumb spica splint was placed and wrapped with Kerlix and Ace bandage.  The tourniquet was deflated at 25 minutes.  Fingertips were pink with brisk capillary refill after deflation of tourniquet.  The operative  drapes were broken down.  The patient was awoken from anesthesia safely.  She was  transferred back to the stretcher and taken to PACU in stable condition.  I will see her back in the office in 1 week for postoperative followup.  I will give her a prescription for Norco 5/325 1-2 tabs PO q6 hours prn pain, dispense # 20.   Leanora Cover, MD Electronically signed, 03/21/21

## 2021-03-21 NOTE — H&P (Signed)
Margaret Webster is an 60 y.o. female.   Chief Complaint: ligament tear HPI: 60 yo female with right thumb ulnar collateral ligament tear.  MRI shows collateral ligament injury.  She has tried non operative treatment without resolution.  She wishes to proceed with operative ligament repair vs reconstruction.  Allergies:  Allergies  Allergen Reactions  . Sulfa Antibiotics Other (See Comments)    Hard to breathe  . Crestor [Rosuvastatin] Other (See Comments)    Makes her mouth numb and causes side pain  . Ibuprofen Other (See Comments)    Stomach upset  . Polytrim [Polymyxin B-Trimethoprim] Itching and Swelling    redness  . Influenza Vaccines Hives  . Lipitor [Atorvastatin Calcium] Other (See Comments)    Muscle ache   . Naproxen Nausea And Vomiting    Past Medical History:  Diagnosis Date  . Arthritis    bil shoulders  . Bronchitis   . Chronic back pain   . Chronic kidney disease   . Diabetes mellitus    stopped Mrtformin 1 mo ago, made her "sick"  . Hypertension   . Hypothyroidism   . Stroke ([place])   . Thyroid disease   . Trigger thumb of right hand     Past Surgical History:  Procedure Laterality Date  . BACK SURGERY  1993  . CARPAL TUNNEL RELEASE  08/2015  . CARPAL TUNNEL RELEASE Left 09/10/2017   Procedure: CARPAL TUNNEL RELEASE;  Surgeon: Leanora Cover, MD;  Location: [facility];  Service: Orthopedics;  Laterality: Left;  . CERVICAL SPINE SURGERY Right 11/2012  . OOPHORECTOMY    . TRIGGER FINGER RELEASE Right 08/06/2017   Procedure: RIGHT THUMB TRIGGER RELEASE;  Surgeon: Leanora Cover, MD;  Location: [facility];  Service: Orthopedics;  Laterality: Right;    Family History: Family History  Problem Relation Age of Onset  . Diabetes Mother   . Cystic fibrosis Sister   . Cancer Sister        ?  Marland Kitchen Heart disease Maternal Grandmother   . Clotting disorder Maternal Grandfather     Social History:   reports that she has been smoking  cigarettes. She has been smoking about 0.25 packs per day. She has never used smokeless tobacco. She reports current alcohol use. She reports current drug use. Drug: Marijuana.  Medications: No medications prior to admission.    No results found for this or any previous visit (from the past 48 hour(s)).  No results found.   A comprehensive review of systems was negative.  Height 5\' 7"  (1.702 m), weight 101.2 kg.  General appearance: alert, cooperative and appears stated age Head: Normocephalic, without obvious abnormality, atraumatic Neck: supple, symmetrical, trachea midline Cardio: regular rate and rhythm Resp: clear to auscultation bilaterally Extremities: Intact sensation and capillary refill all digits.  +epl/fpl/io.  No wounds.  Pulses: 2+ and symmetric Skin: Skin color, texture, turgor normal. No rashes or lesions Neurologic: Grossly normal Incision/Wound: none  Assessment/Plan Right thumb ulnar collateral ligament tear.  Non operative and operative treatment options have been discussed with the patient and patient wishes to proceed with operative treatment. Risks, benefits, and alternatives of surgery have been discussed and the patient agrees with the plan of care.   Leanora Cover 03/21/2021, 8:40 AM

## 2021-03-21 NOTE — Anesthesia Postprocedure Evaluation (Signed)
Anesthesia Post Note  Patient: Margaret Webster  Procedure(s) Performed: RIGHT THUMB ULNAR COLLATERAL LIGAMENT REPAIR (Right Thumb)     Patient location during evaluation: PACU Anesthesia Type: General Level of consciousness: awake and alert Pain management: pain level controlled Vital Signs Assessment: post-procedure vital signs reviewed and stable Respiratory status: spontaneous breathing, nonlabored ventilation, respiratory function stable and patient connected to nasal cannula oxygen Cardiovascular status: blood pressure returned to baseline and stable Postop Assessment: no apparent nausea or vomiting Anesthetic complications: no   No complications documented.  Last Vitals:  Vitals:   03/21/21 1330 03/21/21 1341  BP: (!) 161/89 (!) 163/51  Pulse: (!) 52 70  Resp: 17 16  Temp:  36.5 C  SpO2: 98% 97%    Last Pain:  Vitals:   03/21/21 1341  TempSrc:   PainSc: 1                  Sesilia Poucher

## 2021-03-21 NOTE — Transfer of Care (Signed)
Immediate Anesthesia Transfer of Care Note  Patient: Margaret Webster  Procedure(s) Performed: RIGHT THUMB ULNAR COLLATERAL LIGAMENT REPAIR (Right Thumb)  Patient Location: PACU  Anesthesia Type:General  Level of Consciousness: awake, alert  and oriented  Airway & Oxygen Therapy: Patient Spontanous Breathing and Patient connected to face mask oxygen  Post-op Assessment: Report given to RN and Post -op Vital signs reviewed and stable  Post vital signs: Reviewed and stable  Last Vitals:  Vitals Value Taken Time  BP    Temp    Pulse 61 03/21/21 1254  Resp 13 03/21/21 1254  SpO2 100 % 03/21/21 1254  Vitals shown include unvalidated device data.  Last Pain:  Vitals:   03/21/21 1044  TempSrc: Oral  PainSc: 5          Complications: No complications documented.

## 2021-03-22 ENCOUNTER — Encounter (HOSPITAL_BASED_OUTPATIENT_CLINIC_OR_DEPARTMENT_OTHER): Payer: Self-pay | Admitting: Orthopedic Surgery

## 2021-03-22 NOTE — Addendum Note (Signed)
Addendum  created 03/22/21 1008 by Kailany Dinunzio C, CRNA   Charge Capture section accepted    

## 2021-03-26 ENCOUNTER — Other Ambulatory Visit: Payer: Self-pay

## 2021-03-26 MED ORDER — AMLODIPINE BESYLATE 2.5 MG PO TABS: 2.5000 mg | ORAL_TABLET | Freq: Every day | ORAL | 2 refills | Status: DC

## 2021-03-29 ENCOUNTER — Other Ambulatory Visit (HOSPITAL_COMMUNITY): Payer: Self-pay

## 2021-03-29 DIAGNOSIS — S63641A Sprain of metacarpophalangeal joint of right thumb, initial encounter: Secondary | ICD-10-CM | POA: Diagnosis not present

## 2021-03-29 DIAGNOSIS — M79644 Pain in right finger(s): Secondary | ICD-10-CM | POA: Diagnosis not present

## 2021-03-29 DIAGNOSIS — M25641 Stiffness of right hand, not elsewhere classified: Secondary | ICD-10-CM | POA: Diagnosis not present

## 2021-03-29 MED ORDER — OXYCODONE HCL 5 MG PO TABS
ORAL_TABLET | ORAL | 0 refills | Status: DC
  Filled 2021-03-29: qty 30, 5d supply, fill #0

## 2021-04-01 ENCOUNTER — Other Ambulatory Visit (HOSPITAL_COMMUNITY): Payer: Self-pay

## 2021-04-24 ENCOUNTER — Ambulatory Visit (INDEPENDENT_AMBULATORY_CARE_PROVIDER_SITE_OTHER): Payer: Medicare Other

## 2021-04-24 VITALS — BP 130/66 | HR 67 | Temp 98.1°F | Ht 68.2 in | Wt 225.4 lb

## 2021-04-24 DIAGNOSIS — Z0000 Encounter for general adult medical examination without abnormal findings: Secondary | ICD-10-CM | POA: Diagnosis not present

## 2021-04-24 NOTE — Patient Instructions (Signed)
Margaret Webster , Thank you for taking time to come for your Medicare Wellness Visit. I appreciate your ongoing commitment to your health goals. Please review the following plan we discussed and let me know if I can assist you in the future.   Screening recommendations/referrals: Colonoscopy: completed 12/08/2011 Mammogram: patient to schedule Bone Density: completed 01/17/2020 Recommended yearly ophthalmology/optometry visit for glaucoma screening and checkup Recommended yearly dental visit for hygiene and checkup  Vaccinations: Influenza vaccine: completed 09/10/2020, due 05/27/2020 Pneumococcal vaccine: declined Tdap vaccine: completed 05/10/2018, due 05/10/2028 Shingles vaccine: discussed  Covid-19:  09/10/2020, 02/07/2020, 01/12/2020  Advanced directives: Advance directive discussed with you today. Even though you declined this today please call our office should you change your mind and we can give you the proper paperwork for you to fill out.  Conditions/risks identified: smoking  Next appointment: Follow up in one year for your annual wellness visit.   Preventive Care 40-64 Years, Female Preventive care refers to lifestyle choices and visits with your health care provider that can promote health and wellness. What does preventive care include? A yearly physical exam. This is also called an annual well check. Dental exams once or twice a year. Routine eye exams. Ask your health care provider how often you should have your eyes checked. Personal lifestyle choices, including: Daily care of your teeth and gums. Regular physical activity. Eating a healthy diet. Avoiding tobacco and drug use. Limiting alcohol use. Practicing safe sex. Taking low-dose aspirin daily starting at age 78. Taking vitamin and mineral supplements as recommended by your health care provider. What happens during an annual well check? The services and screenings done by your health care provider during your annual  well check will depend on your age, overall health, lifestyle risk factors, and family history of disease. Counseling  Your health care provider may ask you questions about your: Alcohol use. Tobacco use. Drug use. Emotional well-being. Home and relationship well-being. Sexual activity. Eating habits. Work and work Statistician. Method of birth control. Menstrual cycle. Pregnancy history. Screening  You may have the following tests or measurements: Height, weight, and BMI. Blood pressure. Lipid and cholesterol levels. These may be checked every 5 years, or more frequently if you are over 53 years old. Skin check. Lung cancer screening. You may have this screening every year starting at age 76 if you have a 30-pack-year history of smoking and currently smoke or have quit within the past 15 years. Fecal occult blood test (FOBT) of the stool. You may have this test every year starting at age 39. Flexible sigmoidoscopy or colonoscopy. You may have a sigmoidoscopy every 5 years or a colonoscopy every 10 years starting at age 95. Hepatitis C blood test. Hepatitis B blood test. Sexually transmitted disease (STD) testing. Diabetes screening. This is done by checking your blood sugar (glucose) after you have not eaten for a while (fasting). You may have this done every 1-3 years. Mammogram. This may be done every 1-2 years. Talk to your health care provider about when you should start having regular mammograms. This may depend on whether you have a family history of breast cancer. BRCA-related cancer screening. This may be done if you have a family history of breast, ovarian, tubal, or peritoneal cancers. Pelvic exam and Pap test. This may be done every 3 years starting at age 39. Starting at age 61, this may be done every 5 years if you have a Pap test in combination with an HPV test. Bone density scan. This  is done to screen for osteoporosis. You may have this scan if you are at high risk for  osteoporosis. Discuss your test results, treatment options, and if necessary, the need for more tests with your health care provider. Vaccines  Your health care provider may recommend certain vaccines, such as: Influenza vaccine. This is recommended every year. Tetanus, diphtheria, and acellular pertussis (Tdap, Td) vaccine. You may need a Td booster every 10 years. Zoster vaccine. You may need this after age 18. Pneumococcal 13-valent conjugate (PCV13) vaccine. You may need this if you have certain conditions and were not previously vaccinated. Pneumococcal polysaccharide (PPSV23) vaccine. You may need one or two doses if you smoke cigarettes or if you have certain conditions. Talk to your health care provider about which screenings and vaccines you need and how often you need them. This information is not intended to replace advice given to you by your health care provider. Make sure you discuss any questions you have with your health care provider. Document Released: 11/09/2015 Document Revised: 07/02/2016 Document Reviewed: 08/14/2015 Elsevier Interactive Patient Education  2017 [place] Prevention in the Home Falls can cause injuries. They can happen to people of all ages. There are many things you can do to make your home safe and to help prevent falls. What can I do on the outside of my home? Regularly fix the edges of walkways and driveways and fix any cracks. Remove anything that might make you trip as you walk through a door, such as a raised step or threshold. Trim any bushes or trees on the path to your home. Use bright outdoor lighting. Clear any walking paths of anything that might make someone trip, such as rocks or tools. Regularly check to see if handrails are loose or broken. Make sure that both sides of any steps have handrails. Any raised decks and porches should have guardrails on the edges. Have any leaves, snow, or ice cleared regularly. Use sand or  salt on walking paths during winter. Clean up any spills in your garage right away. This includes oil or grease spills. What can I do in the bathroom? Use night lights. Install grab bars by the toilet and in the tub and shower. Do not use towel bars as grab bars. Use non-skid mats or decals in the tub or shower. If you need to sit down in the shower, use a plastic, non-slip stool. Keep the floor dry. Clean up any water that spills on the floor as soon as it happens. Remove soap buildup in the tub or shower regularly. Attach bath mats securely with double-sided non-slip rug tape. Do not have throw rugs and other things on the floor that can make you trip. What can I do in the bedroom? Use night lights. Make sure that you have a light by your bed that is easy to reach. Do not use any sheets or blankets that are too big for your bed. They should not hang down onto the floor. Have a firm chair that has side arms. You can use this for support while you get dressed. Do not have throw rugs and other things on the floor that can make you trip. What can I do in the kitchen? Clean up any spills right away. Avoid walking on wet floors. Keep items that you use a lot in easy-to-reach places. If you need to reach something above you, use a strong step stool that has a grab bar. Keep electrical cords out  of the way. Do not use floor polish or wax that makes floors slippery. If you must use wax, use non-skid floor wax. Do not have throw rugs and other things on the floor that can make you trip. What can I do with my stairs? Do not leave any items on the stairs. Make sure that there are handrails on both sides of the stairs and use them. Fix handrails that are broken or loose. Make sure that handrails are as long as the stairways. Check any carpeting to make sure that it is firmly attached to the stairs. Fix any carpet that is loose or worn. Avoid having throw rugs at the top or bottom of the stairs. If  you do have throw rugs, attach them to the floor with carpet tape. Make sure that you have a light switch at the top of the stairs and the bottom of the stairs. If you do not have them, ask someone to add them for you. What else can I do to help prevent falls? Wear shoes that: Do not have high heels. Have rubber bottoms. Are comfortable and fit you well. Are closed at the toe. Do not wear sandals. If you use a stepladder: Make sure that it is fully opened. Do not climb a closed stepladder. Make sure that both sides of the stepladder are locked into place. Ask someone to hold it for you, if possible. Clearly mark and make sure that you can see: Any grab bars or handrails. First and last steps. Where the edge of each step is. Use tools that help you move around (mobility aids) if they are needed. These include: Canes. Walkers. Scooters. Crutches. Turn on the lights when you go into a dark area. Replace any light bulbs as soon as they burn out. Set up your furniture so you have a clear path. Avoid moving your furniture around. If any of your floors are uneven, fix them. If there are any pets around you, be aware of where they are. Review your medicines with your doctor. Some medicines can make you feel dizzy. This can increase your chance of falling. Ask your doctor what other things that you can do to help prevent falls. This information is not intended to replace advice given to you by your health care provider. Make sure you discuss any questions you have with your health care provider. Document Released: 08/09/2009 Document Revised: 03/20/2016 Document Reviewed: 11/17/2014 Elsevier Interactive Patient Education  2017 [employer].

## 2021-04-24 NOTE — Progress Notes (Signed)
This visit occurred during the SARS-CoV-2 public health emergency.  Safety protocols were in place, including screening questions prior to the visit, additional usage of staff PPE, and extensive cleaning of exam room while observing appropriate contact time as indicated for disinfecting solutions.  Subjective:   Margaret Webster is a 60 y.o. female who presents for Medicare Annual (Subsequent) preventive examination.  Review of Systems     Cardiac Risk Factors include: diabetes mellitus;smoking/ tobacco exposure;sedentary lifestyle;obesity (BMI >30kg/m2);hypertension     Objective:    Today's Vitals   04/24/21 0921 04/24/21 0927  BP: 130/66   Pulse: 67   Temp: 98.1 F (36.7 C)   TempSrc: Oral   SpO2: 96%   Weight: 225 lb 6.4 oz (102.2 kg)   Height: 5' 8.2" (1.732 m)   PainSc:  6    Body mass index is 34.07 kg/m.  Advanced Directives 04/24/2021 03/21/2021 03/14/2021 04/16/2020 03/08/2020 02/26/2020 01/25/2020  Does Patient Have a Medical Advance Directive? No No No No No No No  Would patient like information on creating a medical advance directive? No - Patient declined No - Patient declined No - Patient declined No - Patient declined No - Patient declined No - Patient declined No - Patient declined    Current Medications (verified) Outpatient Encounter Medications as of 04/24/2021  Medication Sig   amLODipine (NORVASC) 2.5 MG tablet Take 1 tablet (2.5 mg total) by mouth daily.   aspirin EC 81 MG tablet Take 81 mg by mouth once.   HYDROcodone-acetaminophen (NORCO) 5-325 MG tablet 1-2 tabs po q6 hours prn pain   losartan (COZAAR) 25 MG tablet Take 12.5 mg by mouth daily. With potassium   metFORMIN (GLUCOPHAGE-XR) 500 MG 24 hr tablet Take 1 tablet by mouth once daily with breakfast   NEXLETOL 180 MG TABS TAKE 1 TABLET BY MOUTH AT BEDTIME   nystatin cream (MYCOSTATIN) Apply 1 application topically 2 (two) times daily.   nystatin-triamcinolone ointment (MYCOLOG) Apply 1 application  topically 2 (two) times daily.   triamcinolone cream (KENALOG) 0.5 % Apply 1 application topically 2 (two) times daily.   oxyCODONE (OXY IR/ROXICODONE) 5 MG immediate release tablet Take 1 tablet (5 mg total) by mouth every 4 (four) hours as needed for up to 7 days. (Patient not taking: Reported on 04/24/2021)   UNABLE TO FIND Hair,skin and nails : one tablet daily (Patient not taking: Reported on 04/24/2021)   No facility-administered encounter medications on file as of 04/24/2021.    Allergies (verified) Sulfa antibiotics, Crestor [rosuvastatin], Ibuprofen, Polytrim [polymyxin b-trimethoprim], Influenza vaccines, Lipitor [atorvastatin calcium], and Naproxen   History: Past Medical History:  Diagnosis Date   Arthritis    bil shoulders   Bronchitis    Chronic back pain    Chronic kidney disease    Diabetes mellitus    stopped Mrtformin 1 mo ago, made her "sick"   Hypertension    Hypothyroidism    Stroke [facility])    Thyroid disease    Trigger thumb of right hand    Past Surgical History:  Procedure Laterality Date   BACK SURGERY  1993   CARPAL TUNNEL RELEASE  08/2015   CARPAL TUNNEL RELEASE Left 09/10/2017   Procedure: CARPAL TUNNEL RELEASE;  Surgeon: Leanora Cover, MD;  Location: [place];  Service: Orthopedics;  Laterality: Left;   CERVICAL SPINE SURGERY Right 11/2012   OOPHORECTOMY     TRIGGER FINGER RELEASE Right 08/06/2017   Procedure: RIGHT THUMB TRIGGER RELEASE;  Surgeon: Leanora Cover,  MD;  Location: [place];  Service: Orthopedics;  Laterality: Right;   ULNAR COLLATERAL LIGAMENT REPAIR Right 03/21/2021   Procedure: RIGHT THUMB ULNAR COLLATERAL LIGAMENT REPAIR;  Surgeon: Leanora Cover, MD;  Location: [place];  Service: Orthopedics;  Laterality: Right;   Family History  Problem Relation Age of Onset   Diabetes Mother    Cystic fibrosis Sister    Cancer Sister        ?   Heart disease Maternal Grandmother    Clotting  disorder Maternal Grandfather    Social History   Socioeconomic History   Marital status: Single    Spouse name: Not on file   Number of children: 1   Years of education: Not on file   Highest education level: Not on file  Occupational History   Occupation: DISABLED    Employer: DISABLED  Tobacco Use   Smoking status: Every Day    Packs/day: 0.25    Pack years: 0.00    Types: Cigarettes   Smokeless tobacco: Never   Tobacco comments:    4 cig a day, not interested in chantix  Vaping Use   Vaping Use: Never used  Substance and Sexual Activity   Alcohol use: Yes    Comment: occas   Drug use: Yes    Types: Marijuana   Sexual activity: Yes    Partners: Male    Birth control/protection: None  Other Topics Concern   Not on file  Social History Narrative   Not on file   Social Determinants of Health   Financial Resource Strain: Low Risk    Difficulty of Paying Living Expenses: Not hard at all  Food Insecurity: No Food Insecurity   Worried About Charity fundraiser in the Last Year: Never true   Ran Out of Food in the Last Year: Never true  Transportation Needs: No Transportation Needs   Lack of Transportation (Medical): No   Lack of Transportation (Non-Medical): No  Physical Activity: Inactive   Days of Exercise per Week: 0 days   Minutes of Exercise per Session: 0 min  Stress: No Stress Concern Present   Feeling of Stress : Not at all  Social Connections: Not on file    Tobacco Counseling Ready to quit: Yes Counseling given: Not Answered Tobacco comments: 4 cig a day, not interested in chantix   Clinical Intake:  Pre-visit preparation completed: Yes  Pain : 0-10 Pain Score: 6  Pain Type: Acute pain Pain Location: Ear Pain Orientation: Right Pain Radiating Towards: down to neck Pain Descriptors / Indicators: Aching, Sore Pain Onset: In the past 7 days Pain Frequency: Constant Pain Relieving Factors: lidocaine patch helped  Pain Relieving Factors:  lidocaine patch helped  Nutritional Status: BMI > 30  Obese Nutritional Risks: None Diabetes: Yes  How often do you need to have someone help you when you read instructions, pamphlets, or other written materials from your doctor or pharmacy?: 1 - Never What is the last grade level you completed in school?: 12th grade  Diabetic? Yes Nutrition Risk Assessment:  Has the patient had any N/V/D within the last 2 months?  No  Does the patient have any non-healing wounds?  No  Has the patient had any unintentional weight loss or weight gain?  No   Diabetes:  Is the patient diabetic?  Yes  If diabetic, was a CBG obtained today?  No  Did the patient bring in their glucometer from home?  No  How  often do you monitor your CBG's? Does not.   Financial Strains and Diabetes Management:  Are you having any financial strains with the device, your supplies or your medication? No .  Does the patient want to be seen by Chronic Care Management for management of their diabetes?  No  Would the patient like to be referred to a Nutritionist or for Diabetic Management?  No   Diabetic Exams:  Diabetic Eye Exam: Completed 06/21/2020 Diabetic Foot Exam: Completed 03/14/2021   Interpreter Needed?: No  Information entered by :: NAllen LPN   Activities of Daily Living In your present state of health, do you have any difficulty performing the following activities: 04/24/2021 03/21/2021  Hearing? N N  Vision? N N  Comment - -  Difficulty concentrating or making decisions? N N  Comment - -  Walking or climbing stairs? Y N  Comment - -  Dressing or bathing? N N  Doing errands, shopping? N -  Preparing Food and eating ? N -  Using the Toilet? N -  In the past six months, have you accidently leaked urine? Y -  Do you have problems with loss of bowel control? N -  Managing your Medications? N -  Managing your Finances? N -  Housekeeping or managing your Housekeeping? N -  Some recent data might be  hidden    Patient Care Team: [name], FNP as PCP - General ([place]) Little, Claudette Stapler, RN as Case Manager  Indicate any recent Medical Services you may have received from other than [facility] providers in the past year (date may be approximate).     Assessment:   This is a routine wellness examination for Elianie.  Hearing/Vision screen Vision Screening - Comments:: Regular eye exams, Dr. Katy Fitch  Dietary issues and exercise activities discussed: Current Exercise Habits: The patient does not participate in regular exercise at present   Goals Addressed             This Visit's Progress    Patient Stated       04/24/2021, wants to weigh 185 pounds        Depression Screen PHQ 2/9 Scores 04/24/2021 03/14/2021 10/25/2020 06/06/2020 01/25/2020 11/07/2019 10/25/2019  PHQ - 2 Score 1 0 6 0 0 0 0  PHQ- 9 Score - 0 8 1 4  - 1  Exception Documentation - - - - Other- indicate reason in comment box - -  Not completed - - - - just completed with CMA - -    Fall Risk Fall Risk  04/24/2021 01/25/2020 11/07/2019 10/25/2019 09/19/2019  Falls in the past year? 0 0 0 0 0  Number falls in past yr: - - - - -  Injury with Fall? - - - - -  Risk for fall due to : Medication side effect Medication side effect - Medication side effect -  Follow up Falls evaluation completed;Education provided;Falls prevention discussed Falls evaluation completed;Education provided;Falls prevention discussed - Falls evaluation completed;Education provided;Falls prevention discussed -    FALL RISK PREVENTION PERTAINING TO THE HOME:  Any stairs in or around the home? No  If so, are there any without handrails? N/a Home free of loose throw rugs in walkways, pet beds, electrical cords, etc? Yes  Adequate lighting in your home to reduce risk of falls? Yes   ASSISTIVE DEVICES UTILIZED TO PREVENT FALLS:  Life alert? No  Use of a cane, walker or w/c? No  Grab bars in the bathroom? No  Shower chair  or bench in  shower? No  Elevated toilet seat or a handicapped toilet? No   TIMED UP AND GO:  Was the test performed? No .    Gait steady and fast without use of assistive device  Cognitive Function:     6CIT Screen 04/24/2021 01/25/2020 10/25/2019  What Year? 0 points 0 points 0 points  What month? 0 points 0 points 0 points  What time? 3 points 0 points 0 points  Count back from 20 0 points 0 points 0 points  Months in reverse 0 points 0 points 4 points  Repeat phrase 6 points 2 points 8 points  Total Score 9 2 12     Immunizations Immunization History  Administered Date(s) Administered   Influenza-Unspecified 09/10/2020   PFIZER(Purple Top)SARS-COV-2 Vaccination 01/12/2020, 02/07/2020, 09/10/2020   Tdap 05/27/2013, 03/21/2015, 05/10/2018    TDAP status: Up to date  Flu Vaccine status: Up to date  Pneumococcal vaccine status: Declined,  Education has been provided regarding the importance of this vaccine but patient still declined. Advised may receive this vaccine at local pharmacy or Health Dept. Aware to provide a copy of the vaccination record if obtained from local pharmacy or Health Dept. Verbalized acceptance and understanding.   Covid-19 vaccine status: Completed vaccines  Qualifies for Shingles Vaccine? Yes   Zostavax completed No   Shingrix Completed?: No.    Education has been provided regarding the importance of this vaccine. Patient has been advised to call insurance company to determine out of pocket expense if they have not yet received this vaccine. Advised may also receive vaccine at local pharmacy or Health Dept. Verbalized acceptance and understanding.  Screening Tests Health Maintenance  Topic Date Due   Pneumococcal Vaccine 37-48 Years old (1 - PCV) Never done   Zoster Vaccines- Shingrix (1 of 2) Never done   COVID-19 Vaccine (4 - Booster for Pfizer series) 12/11/2020   PNEUMOCOCCAL POLYSACCHARIDE VACCINE AGE 64-64 HIGH RISK  04/18/2022 (Originally 03/13/1963)    INFLUENZA VACCINE  05/27/2021   OPHTHALMOLOGY EXAM  06/21/2021   HEMOGLOBIN A1C  09/12/2021   COLONOSCOPY (Pts 45-14yrs Insurance coverage will need to be confirmed)  12/07/2021   PAP SMEAR-Modifier  12/09/2021   MAMMOGRAM  12/19/2021   FOOT EXAM  03/14/2022   TETANUS/TDAP  05/10/2028   Hepatitis C Screening  Completed   HIV Screening  Completed   HPV VACCINES  Aged Out    Health Maintenance  Health Maintenance Due  Topic Date Due   Pneumococcal Vaccine 35-11 Years old (1 - PCV) Never done   Zoster Vaccines- Shingrix (1 of 2) Never done   COVID-19 Vaccine (4 - Booster for Pfizer series) 12/11/2020    Colorectal cancer screening: Type of screening: Colonoscopy. Completed 12/08/2011. Repeat every 10 years  Mammogram status: patient to schedule  Bone Density status: Completed 01/17/2020.   Lung Cancer Screening: (Low Dose CT Chest recommended if Age 57-80 years, 30 pack-year currently smoking OR have quit w/in 15years.) does not qualify.   Lung Cancer Screening Referral: no  Additional Screening:  Hepatitis C Screening: does qualify; Completed 10/11/2018  Vision Screening: Recommended annual ophthalmology exams for early detection of glaucoma and other disorders of the eye. Is the patient up to date with their annual eye exam?  Yes  Who is the provider or what is the name of the office in which the patient attends annual eye exams? Dr. Katy Fitch If pt is not established with a provider, would they like to be referred to  a provider to establish care? No .   Dental Screening: Recommended annual dental exams for proper oral hygiene  Community Resource Referral / Chronic Care Management: CRR required this visit?  No   CCM required this visit?  No      Plan:     I have personally reviewed and noted the following in the patient's chart:   Medical and social history Use of alcohol, tobacco or illicit drugs  Current medications and supplements including opioid prescriptions.   Functional ability and status Nutritional status Physical activity Advanced directives List of other physicians Hospitalizations, surgeries, and ER visits in previous 12 months Vitals Screenings to include cognitive, depression, and falls Referrals and appointments  In addition, I have reviewed and discussed with patient certain preventive protocols, quality metrics, and best practice recommendations. A written personalized care plan for preventive services as well as general preventive health recommendations were provided to patient.     Kellie Simmering, LPN   [id number]   Nurse Notes:

## 2021-04-30 IMAGING — DX CHEST - 2 VIEW
2 series · 2 of 2 positions shown · non-contrast
Comparison: Multiple priors, most recent March 27, 2015 radiograph

CLINICAL DATA: Right flank pain

EXAM:
CHEST - 2 VIEW

[w chest pa]
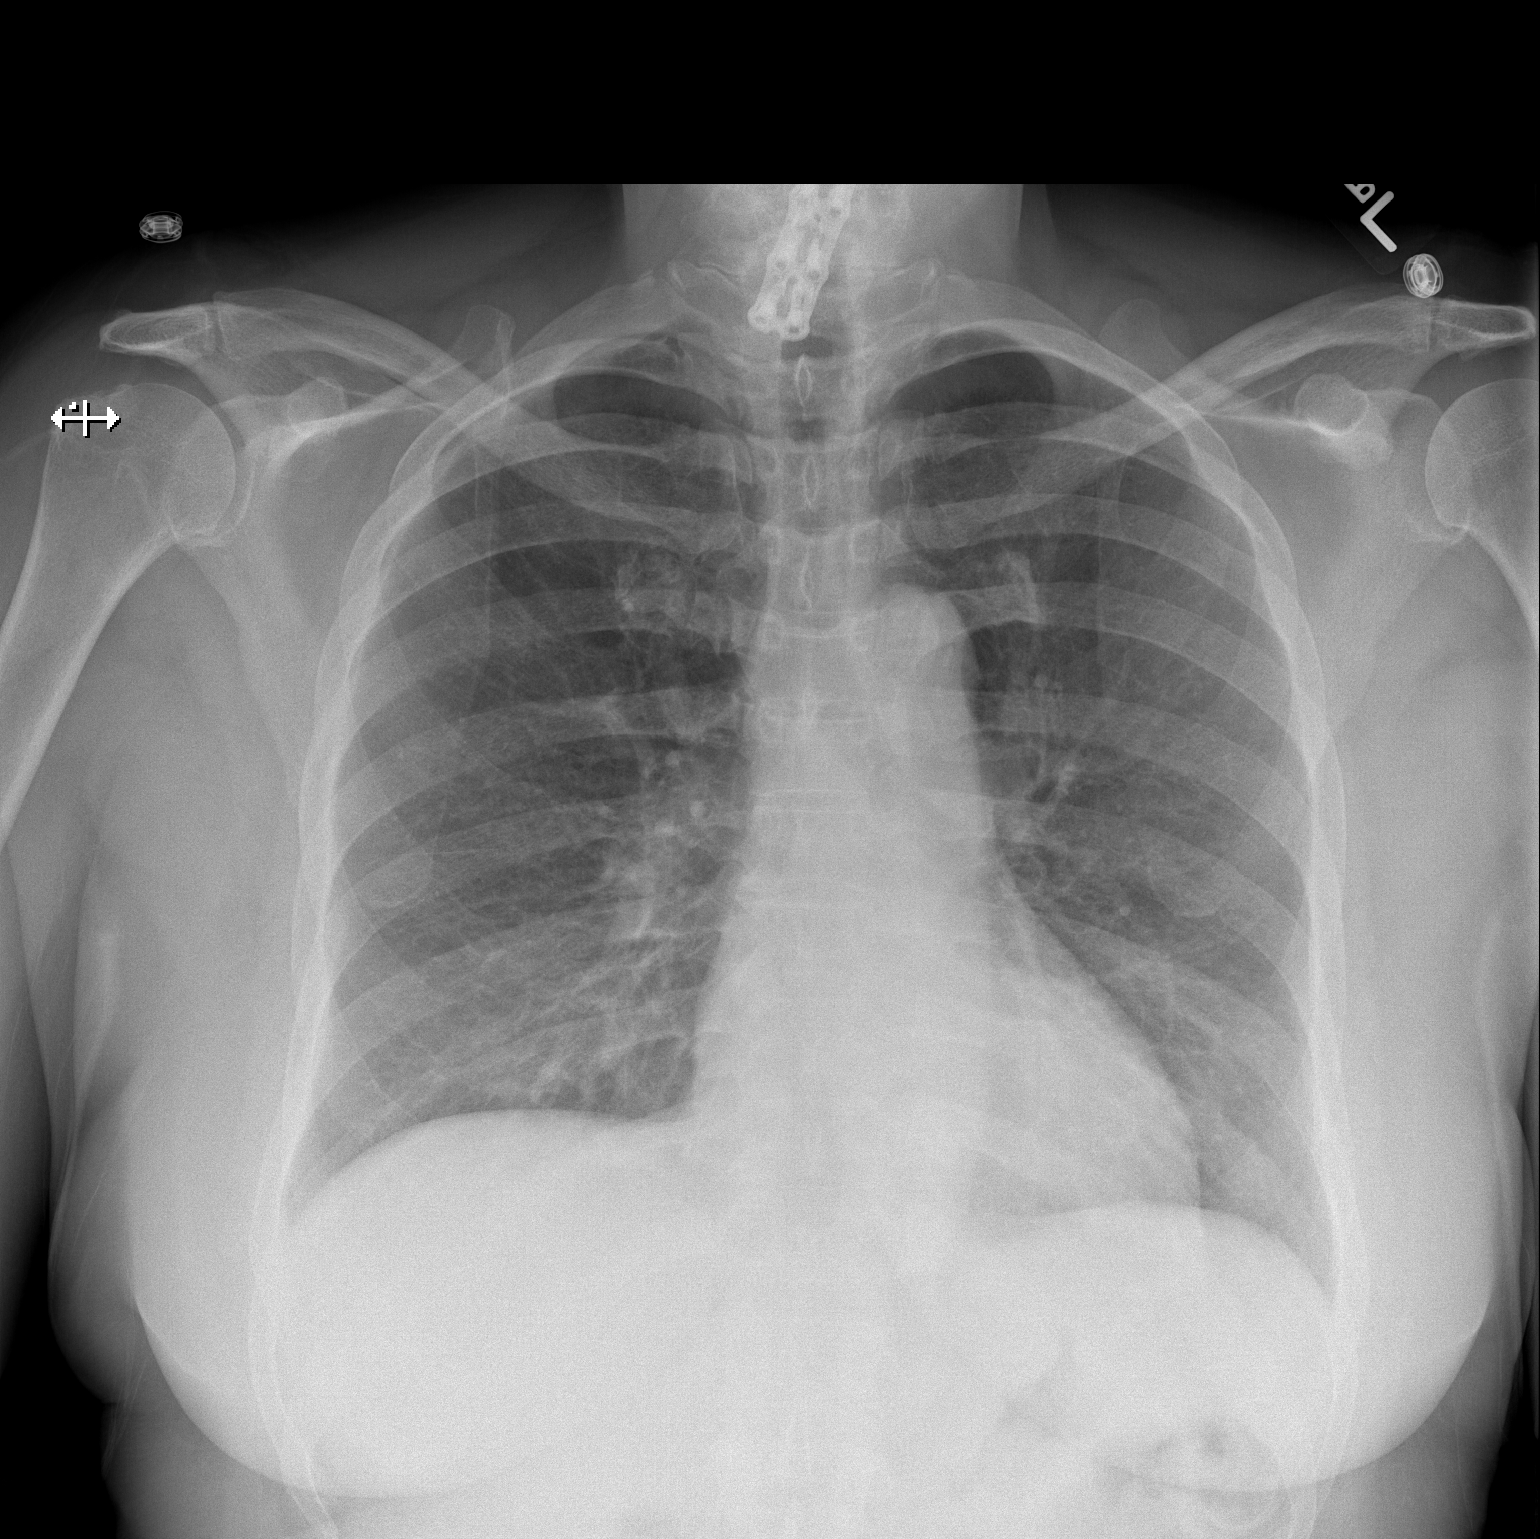

[w chest lat]
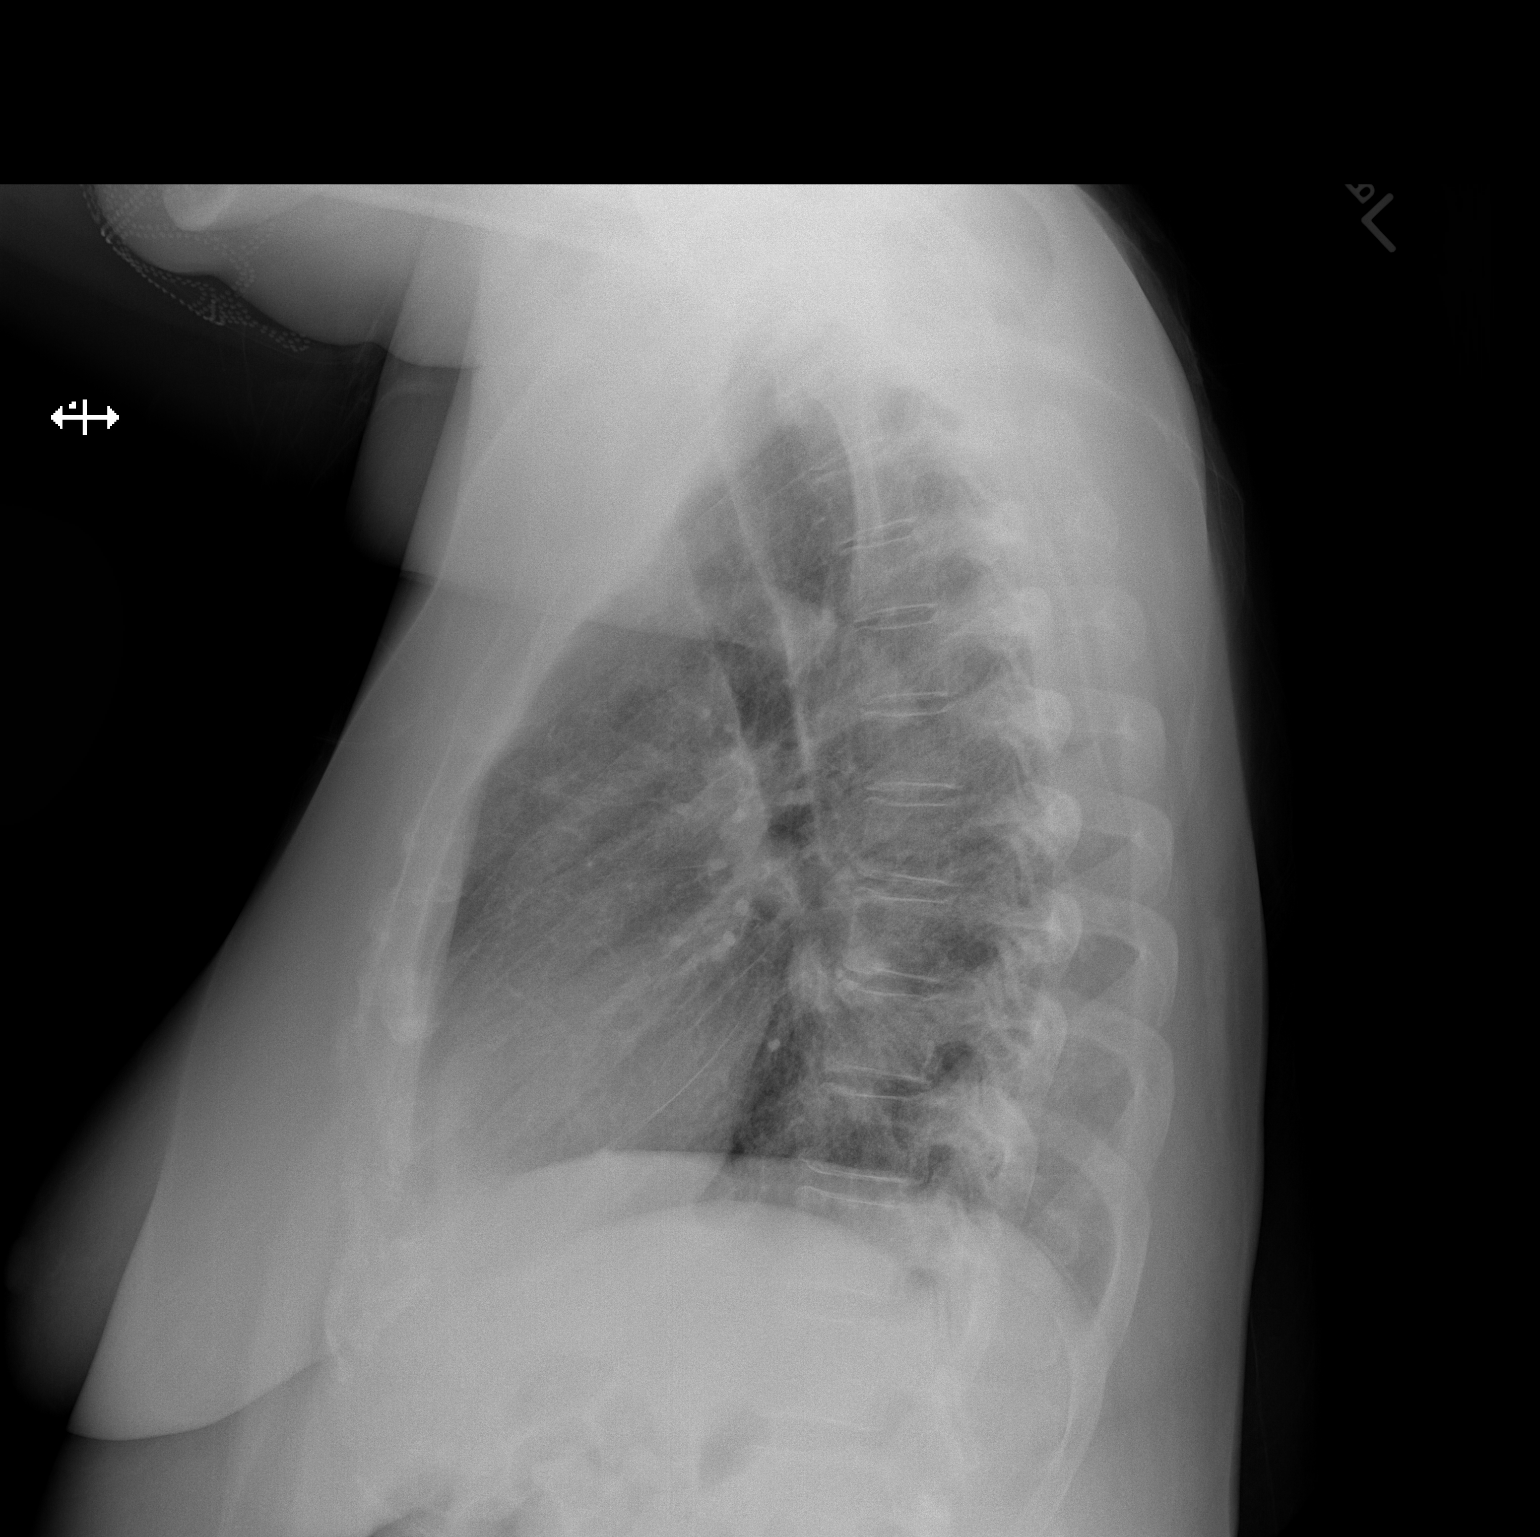

[2 of 2 positions shown; findings below may reference images not displayed]

FINDINGS: The heart size and mediastinal contours are within normal limits.
Both lungs are clear. The visualized skeletal structures are
unremarkable. Degenerative changes are present in the imaged spine
and shoulders. Cervical fixation hardware is grossly normal in
appearance though incompletely assessed on these nondedicated
radiographs.
IMPRESSION: No active cardiopulmonary disease.

## 2021-05-06 ENCOUNTER — Telehealth: Payer: Medicare Other

## 2021-05-06 ENCOUNTER — Ambulatory Visit (INDEPENDENT_AMBULATORY_CARE_PROVIDER_SITE_OTHER): Payer: Medicare Other

## 2021-05-06 DIAGNOSIS — I10 Essential (primary) hypertension: Secondary | ICD-10-CM

## 2021-05-06 DIAGNOSIS — N1831 Chronic kidney disease, stage 3a: Secondary | ICD-10-CM

## 2021-05-06 DIAGNOSIS — E782 Mixed hyperlipidemia: Secondary | ICD-10-CM | POA: Diagnosis not present

## 2021-05-10 ENCOUNTER — Encounter: Payer: Self-pay | Admitting: Nurse Practitioner

## 2021-05-10 NOTE — Patient Instructions (Signed)
Goals Addressed      Mixed Hyperlipidemia - disease progression minimized or prevented   On track    Timeframe:  Long-Range Goal Priority:  High Start Date:  02/04/21                           Expected End Date: 02/04/22     Next Follow Up date:  08/06/21         Self Care Activities:  Continue to keep all scheduled follow up appointments Take medications as directed  Let your healthcare team know if you are unable to take your medications Call your pharmacy for refills at least 7 days prior to running out of medication Increase your water intake unless otherwise directed Review patient educational materials related to Hyperlipidemia  Patient Goals: - to lower Cholesterol by eating better and exercising              Monitor and Manage My Blood Sugar-Diabetes Type 2   On track    Timeframe:  Long-Range Goal Priority:  Medium Start Date: 02/04/21                            Expected End Date: 02/04/22                      Follow Up Date: 08/06/21   - check blood sugar at prescribed times - check blood sugar if I feel it is too high or too low - enter blood sugar readings and medication or insulin into daily log - take the blood sugar log to all doctor visits - take the blood sugar meter to all doctor visits    Why is this important?   Checking your blood sugar at home helps to keep it from getting very high or very low.  Writing the results in a diary or log helps the doctor know how to care for you.  Your blood sugar log should have the time, date and the results.  Also, write down the amount of insulin or other medicine that you take.  Other information, like what you ate, exercise done and how you were feeling, will also be helpful.     Notes:      Obtain Eye Exam-Diabetes Type 2       Timeframe:  Long-Range Goal Priority:  Medium Start Date: 02/04/21                         Expected End Date:  02/04/22                      Follow Up Date: 08/06/21   - keep appointment  with eye doctor - schedule appointment with eye doctor    Why is this important?   Eye check-ups are important when you have diabetes.  Vision loss can be prevented.    Notes:      Osteoarthritis - Maintain Mobility and Function   On track    Timeframe:  Long-Range Goal Priority:  High Start Date:  02/04/21                          Expected End Date:  02/04/22      Next Scheduled Follow Up date: 08/06/21     Self Care Activities:  Continue to keep all scheduled  follow up appointments Take medications as directed  Let your healthcare team know if you are unable to take your medications Call your pharmacy for refills at least 7 days prior to running out of medication Patient Goals: - to keep pain under control                   Perform Foot Care-Diabetes Type 2   On track    Timeframe:  Long-Range Goal Priority:  Medium Start Date:  02/04/21                           Expected End Date: 02/04/22                     Follow Up Date: 08/06/21   - check feet daily for cuts, sores or redness - keep feet up while sitting - trim toenails straight across - wash and dry feet carefully every day - wear comfortable, cotton socks - wear comfortable, well-fitting shoes    Why is this important?   Good foot care is very important when you have diabetes.  There are many things you can do to keep your feet healthy and catch a problem early.    Notes:

## 2021-05-10 NOTE — Chronic Care Management (AMB) (Signed)
Chronic Care Management   CCM RN Visit Note  05/06/2021 Name: Margaret Webster MRN: [id number] DOB: [birth year]  Subjective: Margaret Webster is a 60 y.o. year old female who is a primary care patient of [name], [place]. The care management team was consulted for assistance with disease management and care coordination needs.    Engaged with patient by telephone for follow up visit in response to provider referral for case management and/or care coordination services.   Consent to Services:  The patient was given information about Chronic Care Management services, agreed to services, and gave verbal consent prior to initiation of services.  Please see initial visit note for detailed documentation.   Patient agreed to services and verbal consent obtained.   Assessment: Review of patient past medical history, allergies, medications, health status, including review of consultants reports, laboratory and other test data, was performed as part of comprehensive evaluation and provision of chronic care management services.   SDOH (Social Determinants of Health) assessments and interventions performed: Yes, no acute challenges    CCM Care Plan  Allergies  Allergen Reactions   Sulfa Antibiotics Other (See Comments)    Hard to breathe   Crestor [Rosuvastatin] Other (See Comments)    Makes her mouth numb and causes side pain   Ibuprofen Other (See Comments)    Stomach upset   Polytrim [Polymyxin B-Trimethoprim] Itching and Swelling    redness   Influenza Vaccines Hives   Lipitor [Atorvastatin Calcium] Other (See Comments)    Muscle ache    Naproxen Nausea And Vomiting    Outpatient Encounter Medications as of 05/06/2021  Medication Sig   amLODipine (NORVASC) 2.5 MG tablet Take 1 tablet (2.5 mg total) by mouth daily.   aspirin EC 81 MG tablet Take 81 mg by mouth once.   HYDROcodone-acetaminophen (NORCO) 5-325 MG tablet 1-2 tabs po q6 hours prn pain   losartan (COZAAR) 25 MG tablet Take  12.5 mg by mouth daily. With potassium   metFORMIN (GLUCOPHAGE-XR) 500 MG 24 hr tablet Take 1 tablet by mouth once daily with breakfast   NEXLETOL 180 MG TABS TAKE 1 TABLET BY MOUTH AT BEDTIME   nystatin cream (MYCOSTATIN) Apply 1 application topically 2 (two) times daily.   nystatin-triamcinolone ointment (MYCOLOG) Apply 1 application topically 2 (two) times daily.   oxyCODONE (OXY IR/ROXICODONE) 5 MG immediate release tablet Take 1 tablet (5 mg total) by mouth every 4 (four) hours as needed for up to 7 days. (Patient not taking: Reported on 04/24/2021)   triamcinolone cream (KENALOG) 0.5 % Apply 1 application topically 2 (two) times daily.   UNABLE TO FIND Hair,skin and nails : one tablet daily (Patient not taking: Reported on 04/24/2021)   No facility-administered encounter medications on file as of 05/06/2021.    Patient Active Problem List   Diagnosis Date Noted   Depression 07/05/2020   Ringing in ears 07/05/2020   CVA (cerebral vascular accident) ([place]) 02/26/2020   CKD (chronic kidney disease) stage 3, GFR 30-59 ml/min (HCC)    Bradycardia    Elevated TSH    Multiple joint pain 11/07/2019   Acute pain of right shoulder 09/19/2019   Right hip pain 09/19/2019   Prediabetes 09/08/2018   Mass of joint of finger 07/26/2015   Chest pain 01/10/2015   DM (diabetes mellitus) ([place]) 01/10/2015   Hypertension 01/10/2015   Hypothyroidism 01/10/2015   Headache 01/10/2015   Abdominal pain 01/10/2015   Vaginitis 04/07/2014   Pain 04/07/2014   Vaginal  irritation 04/07/2014   Boil of buttock 04/06/2014   BV (bacterial vaginosis) 04/06/2014   Unspecified symptom associated with female genital organs 11/16/2013   Candidiasis of vulva and vagina 11/16/2013   Vaginitis and vulvovaginitis, unspecified 06/07/2013   Pain aggravated by activities of daily living 06/07/2013   S/P cervical spinal fusion 01/17/2013   Cervical radiculopathy 12/13/2012   Carpal tunnel syndrome on both sides 12/06/2012    Cervical spondylosis with myelopathy 12/06/2012   Nonspecific abnormal finding in stool contents 12/02/2011   Diabetes mellitus without mention of complication 26/37/8588   Chronic back pain     Conditions to be addressed/monitored: Essential hypertension, Stage 3a chronic kidney disease, Mixed hyperlipidemia, Prediabetes  Care Plan : Diabetes Type 2 (Adult)  Updates made by Lynne Logan, RN since 05/06/2021 12:00 AM     Problem: Glycemic Management (Diabetes, Type 2)   Priority: Medium     Long-Range Goal: Glycemic Management Optimized   Start Date: 02/04/2021  Expected End Date: 02/04/2022  Recent Progress: On track  Priority: Medium  Note:   Objective:  Lab Results  Component Value Date   HGBA1C 6.0 (H) 03/12/2021   Lab Results  Component Value Date   CREATININE 1.22 (H) 03/12/2021   CREATININE 1.19 (H) 10/25/2020   CREATININE 1.06 (H) 06/06/2020   Lab Results  Component Value Date   EGFR 50 (L) 03/12/2021   Current Barriers:  Knowledge Deficits related to basic Diabetes pathophysiology and self care/management Knowledge Deficits related to medications used for management of diabetes Case Manager Clinical Goal(s):  patient will demonstrate improved adherence to prescribed treatment plan for diabetes self care/management as evidenced by: daily monitoring and recording of CBG  adherence to ADA/ carb modified diet exercise 5 days/week adherence to prescribed medication regimen contacting provider for new or worsened symptoms or questions Interventions:  05/06/21 completed successful outbound call with patient  Collaboration with [name], [place] regarding development and update of comprehensive plan of care as evidenced by provider attestation and co-signature Inter-disciplinary care team collaboration (see longitudinal plan of care) Provided education to patient about basic DM disease process Review of patient status, including review of consultants reports,  relevant laboratory and other test results, and medications completed. Educated patient on education and dietary recommendations; daily glycemic control, FBS 80-130, <180 after meals; 15'15' rule  Reviewed medications with patient and discussed importance of medication adherence Mailed printed educational materials related to Low Carb High Protein Smoothies Advised patient, providing education and rationale, to check cbg daily before meals and at bedtime and record, calling the PCP and or CM RN for findings outside established parameters.   Discussed plans with patient for ongoing care management follow up and provided patient with direct contact information for care management team Self-Care Activities Self administers oral medications as prescribed Attends all scheduled provider appointments Checks blood sugars as prescribed and utilize hyper and hypoglycemia protocol as needed Adheres to prescribed ADA/carb modified Patient Goals: - check blood sugar at prescribed times - check blood sugar if I feel it is too high or too low - enter blood sugar readings and medication or insulin into daily log - take the blood sugar log to all doctor visits - take the blood sugar meter to all doctor visits - keep appointment with eye doctor - schedule appointment with eye doctor - check feet daily for cuts, sores or redness - keep feet up while sitting - trim toenails straight across - wash and dry feet carefully every day -  wear comfortable, cotton socks - wear comfortable, well-fitting shoes  Follow Up Plan: Telephone follow up appointment with care management team member scheduled for: 08/06/21    Care Plan : Mixed Hyperlipidemia  Updates made by Lynne Logan, RN since 05/06/2021 12:00 AM     Problem: Mixed Hyperlipidemia   Priority: High     Long-Range Goal: Mixed Hyperlipidemia - disease progression minimized or prevented   Start Date: 02/04/2021  Expected End Date: 02/04/2022  Recent  Progress: On track  Priority: High  Note:   Current Barriers:  Ineffective Self Health Maintenance  Clinical Goal(s):  Collaboration with [name], FNP regarding development and update of comprehensive plan of care as evidenced by provider attestation and co-signature Inter-disciplinary care team collaboration (see longitudinal plan of care) patient will work with care management team to address care coordination and chronic disease management needs related to Disease Management Educational Needs Care Coordination Medication Management and Education Psychosocial Support   Interventions:  05/06/21 completed successful outbound call with patient  Evaluation of current treatment plan related to  Mixed Hyperlipidemia , self-management and patient's adherence to plan as established by provider. Collaboration with [name], FNP regarding development and update of comprehensive plan of care as evidenced by provider attestation       and co-signature Inter-disciplinary care team collaboration (see longitudinal plan of care) Provided education to patient about basic disease process related to Mixed Hyperlipidemia  Review of patient status, including review of consultants reports, relevant laboratory and other test results, and medications completed. Educated patient on dietary and exercise recommendations  Reviewed medications with patient and discussed importance of medication adherence Confirmed patient received printed educational material related to Managing Cholesterol  Discussed plans with patient for ongoing care management follow up and provided patient with direct contact information for care management team Self Care Activities:  Continue to keep all scheduled follow up appointments Take medications as directed  Let your healthcare team know if you are unable to take your medications Call your pharmacy for refills at least 7 days prior to running out of medication Increase  your water intake unless otherwise directed Review patient educational materials related to Hyperlipidemia  Patient Goals: - to lower Cholesterol by eating better and exercising   Follow Up Plan: Telephone follow up appointment with care management team member scheduled for: 08/06/21     Care Plan : Osteoarthritis (Adult)  Updates made by Lynne Logan, RN since 05/06/2021 12:00 AM     Problem: Mobility and Function (Osteoarthritis)   Priority: High     Long-Range Goal: Maintain Mobility and Function   Start Date: 02/04/2021  Expected End Date: 02/04/2022  Recent Progress: On track  Priority: High  Note:   Current Barriers:  Ineffective Self Health Maintenance  Clinical Goal(s):  Collaboration with [name], FNP regarding development and update of comprehensive plan of care as evidenced by provider attestation and co-signature Inter-disciplinary care team collaboration (see longitudinal plan of care) patient will work with care management team to address care coordination and chronic disease management needs related to Disease Management Educational Needs Care Coordination Medication Management and Education Psychosocial Support   Interventions:  05/06/21 completed successful outbound call with patient  Evaluation of current treatment plan related to  Osteoarthritis , self-management and patient's adherence to plan as established by provider. Collaboration with [name], FNP regarding development and update of comprehensive plan of care as evidenced by provider attestation       and co-signature Inter-disciplinary  care team collaboration (see longitudinal plan of care) Provided education to patient about basic disease process related to Osteoarthritis Review of patient status, including review of consultants reports, relevant laboratory and other test results, and medications completed. Reviewed medications with patient and discussed importance of medication  adherence Discussed follow up with Dr. Marin Shutter MD with [facility] with the following Assessment/Plan reviewed:   Margaret Webster is being seen in follow up for right thumb ulnar collateral ligament repair. She is 6 weeks postop. She states she is doing well overall. She is tired of the splint and wants to go back to work  Examination: Intact sensation cap refill in the fingertips. Wound is nicely healed. She has good endpoint to stressing of the ulnar collateral ligament. Minimal pain  Assessment and plan: This above-noted procedures. She is doing well overall. We will advance her therapy per protocol. I think she can go back to work with a 3 pound lifting restriction on the right. She should be wearing the splint. I will check her back in 6 weeks. She agrees with the plan of care.  Educated patient on the benefits of adhering to her HEP and MD recommendations for lifting restrictions  Discussed plans with patient for ongoing care management follow up and provided patient with direct contact information for care management team Self Care Activities:  Continue to keep all scheduled follow up appointments Take medications as directed  Let your healthcare team know if you are unable to take your medications Call your pharmacy for refills at least 7 days prior to running out of medication Increase your water intake unless otherwise directed Patient Goals: - to keep pain under control   Follow Up Plan: Telephone follow up appointment with care management team member scheduled for: 08/06/21    Plan:Telephone follow up appointment with care management team member scheduled for:  08/06/21  Barb Merino, RN, BSN, CCM Care Management Coordinator [facility]  Direct Phone: [phone]

## 2021-05-15 DIAGNOSIS — S63641D Sprain of metacarpophalangeal joint of right thumb, subsequent encounter: Secondary | ICD-10-CM | POA: Diagnosis not present

## 2021-05-15 DIAGNOSIS — R29898 Other symptoms and signs involving the musculoskeletal system: Secondary | ICD-10-CM | POA: Diagnosis not present

## 2021-05-15 DIAGNOSIS — M79644 Pain in right finger(s): Secondary | ICD-10-CM | POA: Diagnosis not present

## 2021-05-15 DIAGNOSIS — M25641 Stiffness of right hand, not elsewhere classified: Secondary | ICD-10-CM | POA: Diagnosis not present

## 2021-05-23 DIAGNOSIS — M79644 Pain in right finger(s): Secondary | ICD-10-CM | POA: Diagnosis not present

## 2021-05-23 DIAGNOSIS — M25641 Stiffness of right hand, not elsewhere classified: Secondary | ICD-10-CM | POA: Diagnosis not present

## 2021-05-23 DIAGNOSIS — R29898 Other symptoms and signs involving the musculoskeletal system: Secondary | ICD-10-CM | POA: Diagnosis not present

## 2021-05-29 DIAGNOSIS — M25641 Stiffness of right hand, not elsewhere classified: Secondary | ICD-10-CM | POA: Diagnosis not present

## 2021-05-29 DIAGNOSIS — R29898 Other symptoms and signs involving the musculoskeletal system: Secondary | ICD-10-CM | POA: Diagnosis not present

## 2021-05-29 DIAGNOSIS — M79644 Pain in right finger(s): Secondary | ICD-10-CM | POA: Diagnosis not present

## 2021-06-12 DIAGNOSIS — M25641 Stiffness of right hand, not elsewhere classified: Secondary | ICD-10-CM | POA: Diagnosis not present

## 2021-06-12 DIAGNOSIS — M79644 Pain in right finger(s): Secondary | ICD-10-CM | POA: Diagnosis not present

## 2021-06-12 DIAGNOSIS — R29898 Other symptoms and signs involving the musculoskeletal system: Secondary | ICD-10-CM | POA: Diagnosis not present

## 2021-06-17 DIAGNOSIS — S63641D Sprain of metacarpophalangeal joint of right thumb, subsequent encounter: Secondary | ICD-10-CM | POA: Diagnosis not present

## 2021-06-17 DIAGNOSIS — M180 Bilateral primary osteoarthritis of first carpometacarpal joints: Secondary | ICD-10-CM | POA: Diagnosis not present

## 2021-06-20 ENCOUNTER — Other Ambulatory Visit: Payer: Self-pay | Admitting: Internal Medicine

## 2021-06-20 DIAGNOSIS — Z1231 Encounter for screening mammogram for malignant neoplasm of breast: Secondary | ICD-10-CM

## 2021-06-22 ENCOUNTER — Other Ambulatory Visit: Payer: Self-pay

## 2021-06-22 ENCOUNTER — Emergency Department (HOSPITAL_COMMUNITY): Payer: Medicare Other

## 2021-06-22 ENCOUNTER — Encounter (HOSPITAL_COMMUNITY): Payer: Self-pay | Admitting: Emergency Medicine

## 2021-06-22 ENCOUNTER — Emergency Department (HOSPITAL_COMMUNITY)
Admission: EM | Admit: 2021-06-22 | Discharge: 2021-06-22 | Disposition: A | Discharge status: Home / Self Care | Payer: Medicare Other | Attending: Emergency Medicine | Admitting: Emergency Medicine

## 2021-06-22 DIAGNOSIS — Z79899 Other long term (current) drug therapy: Secondary | ICD-10-CM | POA: Diagnosis not present

## 2021-06-22 DIAGNOSIS — M79642 Pain in left hand: Secondary | ICD-10-CM | POA: Diagnosis present

## 2021-06-22 DIAGNOSIS — F17210 Nicotine dependence, cigarettes, uncomplicated: Secondary | ICD-10-CM | POA: Insufficient documentation

## 2021-06-22 DIAGNOSIS — M19042 Primary osteoarthritis, left hand: Secondary | ICD-10-CM | POA: Diagnosis not present

## 2021-06-22 DIAGNOSIS — Z7984 Long term (current) use of oral hypoglycemic drugs: Secondary | ICD-10-CM | POA: Insufficient documentation

## 2021-06-22 DIAGNOSIS — M654 Radial styloid tenosynovitis [de Quervain]: Secondary | ICD-10-CM

## 2021-06-22 DIAGNOSIS — Z7982 Long term (current) use of aspirin: Secondary | ICD-10-CM | POA: Insufficient documentation

## 2021-06-22 DIAGNOSIS — E1122 Type 2 diabetes mellitus with diabetic chronic kidney disease: Secondary | ICD-10-CM | POA: Diagnosis not present

## 2021-06-22 DIAGNOSIS — I129 Hypertensive chronic kidney disease with stage 1 through stage 4 chronic kidney disease, or unspecified chronic kidney disease: Secondary | ICD-10-CM | POA: Insufficient documentation

## 2021-06-22 DIAGNOSIS — N183 Chronic kidney disease, stage 3 unspecified: Secondary | ICD-10-CM | POA: Diagnosis not present

## 2021-06-22 DIAGNOSIS — E039 Hypothyroidism, unspecified: Secondary | ICD-10-CM | POA: Diagnosis not present

## 2021-06-22 MED ORDER — ACETAMINOPHEN 500 MG PO TABS
1000.0000 mg | ORAL_TABLET | Freq: Once | ORAL | Status: AC
  Administered 2021-06-22: 1000 mg via ORAL
  Filled 2021-06-22: qty 2

## 2021-06-22 NOTE — ED Triage Notes (Signed)
Pt coming from home complaint of left hand pain since work on Wednesday, denies injury to hand. VSS. NAD.

## 2021-06-22 NOTE — ED Provider Notes (Signed)
[name] Provider Note   CSN: [id number] Arrival date & time: 06/22/21  1259     History Chief Complaint  Patient presents with   left  hand pain    LEVERA Margaret Webster is a 60 y.o. female who presents with concern for left hand pain that started on Wednesday.  Denies any injury of the hand.  She did recently have surgery 3 months ago on her right hand and has been using it very minimally and using her left hand instead.  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  Denies any numbness, weakness in the hand but does endorse some tingling in the fingertips.  Endorses pain primarily at the base of her thumb and radial aspect of her wrist.  I personally read this patient medical records.  History of diabetes, arthritis, trigger thumb of the right hand status postsurgical repair.  She is not anticoagulated.  HPI     Past Medical History:  Diagnosis Date   Arthritis    bil shoulders   Bronchitis    Chronic back pain    Chronic kidney disease    Diabetes mellitus    stopped Mrtformin 1 mo ago, made her "sick"   Hypertension    Hypothyroidism    Stroke [facility])    Thyroid disease    Trigger thumb of right hand     Patient Active Problem List   Diagnosis Date Noted   Depression 07/05/2020   Ringing in ears 07/05/2020   CVA (cerebral vascular accident) ([place]) 02/26/2020   CKD (chronic kidney disease) stage 3, GFR 30-59 ml/min (HCC)    Bradycardia    Elevated TSH    Multiple joint pain 11/07/2019   Acute pain of right shoulder 09/19/2019   Right hip pain 09/19/2019   Prediabetes 09/08/2018   Mass of joint of finger 07/26/2015   Chest pain 01/10/2015   DM (diabetes mellitus) ([place]) 01/10/2015   Hypertension 01/10/2015   Hypothyroidism 01/10/2015   Headache 01/10/2015   Abdominal pain 01/10/2015   Vaginitis 04/07/2014   Pain 04/07/2014   Vaginal irritation 04/07/2014   Boil of buttock 04/06/2014   BV (bacterial vaginosis) 04/06/2014    Unspecified symptom associated with female genital organs 11/16/2013   Candidiasis of vulva and vagina 11/16/2013   Vaginitis and vulvovaginitis, unspecified 06/07/2013   Pain aggravated by activities of daily living 06/07/2013   S/P cervical spinal fusion 01/17/2013   Cervical radiculopathy 12/13/2012   Carpal tunnel syndrome on both sides 12/06/2012   Cervical spondylosis with myelopathy 12/06/2012   Nonspecific abnormal finding in stool contents 12/02/2011   Diabetes mellitus without mention of complication [id number]   Chronic back pain     Past Surgical History:  Procedure Laterality Date   [place]  08/2015   CARPAL TUNNEL RELEASE Left 09/10/2017   Procedure: CARPAL TUNNEL RELEASE;  Surgeon: Leanora Cover, MD;  Location: [place];  Service: Orthopedics;  Laterality: Left;   CERVICAL SPINE SURGERY Right 11/2012   OOPHORECTOMY     TRIGGER FINGER RELEASE Right 08/06/2017   Procedure: RIGHT THUMB TRIGGER RELEASE;  Surgeon: Leanora Cover, MD;  Location: [place];  Service: Orthopedics;  Laterality: Right;   ULNAR COLLATERAL LIGAMENT REPAIR Right 03/21/2021   Procedure: RIGHT THUMB ULNAR COLLATERAL LIGAMENT REPAIR;  Surgeon: Leanora Cover, MD;  Location: [place];  Service: Orthopedics;  Laterality: Right;     OB History  Gravida  1   Para  0   Term  0   Preterm  0   AB  0   Living  1      SAB  0   IAB  0   Ectopic  0   Multiple  0   Live Births  1           Family History  Problem Relation Age of Onset   Diabetes Mother    Cystic fibrosis Sister    Cancer Sister        ?   Heart disease Maternal Grandmother    Clotting disorder Maternal Grandfather     Social History   Tobacco Use   Smoking status: Every Day    Packs/day: 0.25    Types: Cigarettes   Smokeless tobacco: Never   Tobacco comments:    4 cig a day, not interested in chantix  Vaping Use    Vaping Use: Never used  Substance Use Topics   Alcohol use: Yes    Comment: occas   Drug use: Yes    Types: Marijuana    Home Medications Prior to Admission medications   Medication Sig Start Date End Date Taking? Authorizing Provider  amLODipine (NORVASC) 2.5 MG tablet Take 1 tablet (2.5 mg total) by mouth daily. 03/26/21 03/26/22  [name], FNP  aspirin EC 81 MG tablet Take 81 mg by mouth once.    [provider]  HYDROcodone-acetaminophen (NORCO) 5-325 MG tablet 1-2 tabs po q6 hours prn pain 03/21/21   Leanora Cover, MD  losartan (COZAAR) 25 MG tablet Take 12.5 mg by mouth daily. With potassium 03/08/21   [provider]  metFORMIN (GLUCOPHAGE-XR) 500 MG 24 hr tablet Take 1 tablet by mouth once daily with breakfast 11/01/20   [name], FNP  NEXLETOL 180 MG TABS TAKE 1 TABLET BY MOUTH AT BEDTIME 11/01/20   [name], FNP  nystatin cream (MYCOSTATIN) Apply 1 application topically 2 (two) times daily. 11/01/20   [name], FNP  nystatin-triamcinolone ointment [facility]) Apply 1 application topically 2 (two) times daily. 10/25/20   [name], FNP  oxyCODONE (OXY IR/ROXICODONE) 5 MG immediate release tablet Take 1 tablet (5 mg total) by mouth every 4 (four) hours as needed for up to 7 days. Patient not taking: Reported on 04/24/2021 03/29/21     triamcinolone cream (KENALOG) 0.5 % Apply 1 application topically 2 (two) times daily. 11/01/20   [name], FNP  UNABLE TO FIND Hair,skin and nails : one tablet daily Patient not taking: Reported on 04/24/2021    [provider]    Allergies    Sulfa antibiotics, Crestor [rosuvastatin], Ibuprofen, Polytrim [polymyxin b-trimethoprim], Influenza vaccines, Lipitor [atorvastatin calcium], and Naproxen  Review of Systems   Review of Systems  Constitutional: Negative.   HENT: Negative.    Respiratory: Negative.    Cardiovascular: Negative.   Gastrointestinal: Negative.   Genitourinary: Negative.    Musculoskeletal:  Positive for arthralgias.  Neurological: Negative.    Physical Exam Updated Vital Signs BP (!) 120/49 (BP Location: Right Arm)   Pulse 61   Temp 98.7 F (37.1 C) (Oral)   Resp 16   SpO2 97%   Physical Exam Vitals and nursing note reviewed.  HENT:     Head: Normocephalic and atraumatic.  Eyes:     General: No scleral icterus.       Right eye: No discharge.        Left eye: No discharge.  Extraocular Movements: Extraocular movements intact.     Conjunctiva/sclera: Conjunctivae normal.     Pupils: Pupils are equal, round, and reactive to light.  Cardiovascular:     Heart sounds: Normal heart sounds.  Pulmonary:     Effort: Pulmonary effort is normal.  Abdominal:     Palpations: Abdomen is soft.     Tenderness: There is no guarding or rebound.  Musculoskeletal:     Right elbow: Normal.     Left elbow: Normal.     Right forearm: Normal.     Left forearm: Normal.     Right wrist: Normal.     Left wrist: Tenderness present. No swelling, snuff box tenderness or crepitus.     Right hand: Normal. Normal capillary refill.     Left hand: Tenderness present. No bony tenderness. Normal strength. Normal sensation. Normal capillary refill. Normal pulse.       Hands:     Comments: Normal cap refill in all 5 digits of right hand, normal strength and sensation.  Skin:    General: Skin is warm and dry.     Capillary Refill: Capillary refill takes less than 2 seconds.  Neurological:     General: No focal deficit present.     Mental Status: She is alert.  Psychiatric:        Mood and Affect: Mood normal.    ED Results / Procedures / Treatments   Labs (all labs ordered are listed, but only abnormal results are displayed) Labs Reviewed - No data to display  EKG None  Radiology DG Hand Complete Left  Result Date: 06/22/2021 CLINICAL DATA:  LEFT hand.  No known injury. EXAM: LEFT HAND - COMPLETE 3+ VIEW COMPARISON:  None. FINDINGS: Osseous alignment is. No  fracture line or displaced fracture fragment. No focal cortical irregularity or osseous lesion. Mild degenerative spurring at the D IP joints. No erosions or other signs of an inflammatory arthritis. Soft tissues about the LEFT hand are unremarkable. IMPRESSION: 1. No acute findings. 2. Mild degenerative osteoarthritic changes at the distal interphalangeal joint spaces. Electronically Signed   By: Franki Cabot M.D.   On: 06/22/2021 14:59    Procedures Procedures   Medications Ordered in ED Medications  acetaminophen (TYLENOL) tablet 1,000 mg (has no administration in time range)    ED Course  I have reviewed the triage vital signs and the nursing notes.  Pertinent labs & imaging results that were available during my care of the patient were reviewed by me and considered in my medical decision making (see chart for details).    MDM Rules/Calculators/A&P                         60 year old female presents with her use of the left hand now with wrist and base of thumb pain.  Differential diagnosis includes resolving to de Quervain's tenosynovitis, carpal tunnel syndrome, trigger finger, joint contracture, ganglionic cyst, acute fracture dislocation, ligamentous or tendinous injury, cellulitis.  Hypertensive on intake, vitals otherwise normal.  Patient is neurovascular intact in all 4 extremities and has tenderness palpation over the base of the thenar eminence and radial aspect of the left wrist with positive Finkelstein test.  Plain film of the hand negative for acute osseous abnormality.    Physical exam and HPI most consistent with de Quervain tenosynovitis.  Will place patient in removable thumb spica splint and have her follow-up with her hand surgeon whom she is seen for right hand  issues in the past.  Will write work note for light duty at work.  May use OTC analgesia as needed.  No further work-up warranted needed this time given reassuring physical exam and vital signs.  Gracyn  voiced understanding of her medical evaluation treatment.  Each of her questions was answered to her expressed satisfaction.  Return precautions given.  Patient is well-appearing, stable for discharge at this time.  This chart was dictated using voice recognition software, Dragon. Despite the best efforts of this provider to proofread and correct errors, errors may still occur which can change documentation meaning.  Final Clinical Impression(s) / ED Diagnoses Final diagnoses:  Tenosynovitis, de Loralie Champagne    Rx / DC Orders ED Discharge Orders     None        Aura Dials 06/23/21 0057    Hayden Rasmussen, MD 06/23/21 1105

## 2021-06-22 NOTE — Discharge Instructions (Addendum)
You were seen in the ER today for your left wrist and thumb pain.  You were diagnosed with de Quervain's tenosynovitis, which is inflammation of the tendon of your thumb.  You have been placed in a splint which you should wear daily for the next 3 to 4 weeks.  You may remove the splint briefly each day to to move your wrist around to keep it from getting stiff.  You may use Tylenol as needed for your discomfort and I do recommend you follow-up closely with your hand surgeon in the outpatient setting.  Return to the ER with any new or worsening numbness, tingling, weakness in the hand, or any other new severe symptoms.

## 2021-06-22 NOTE — ED Notes (Signed)
DC instructions reviewed with pt. PT verbalized understanding. PT DC °

## 2021-06-22 NOTE — Progress Notes (Signed)
Orthopedic Tech Progress Note Patient Details:  Margaret Webster August 14, 1961 [id number]  Ortho Devices Type of Ortho Device: Thumb velcro splint Ortho Device/Splint Location: Left thumb Ortho Device/Splint Interventions: Application   Post Interventions Patient Tolerated: Well  Linus Salmons Ashari Llewellyn 06/22/2021, 5:32 PM

## 2021-06-22 NOTE — ED Provider Notes (Signed)
Emergency Medicine Provider Triage Evaluation Note  Margaret Webster , a 60 y.o. female  was evaluated in triage.  Pt complains of left hand pain seems a worse around the base of the thumb.  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  Review of Systems  Positive: Hand pain Negative: Fever, trauma  Physical Exam  BP (!) 120/49 (BP Location: Right Arm)   Pulse 61   Temp 98.7 F (37.1 C) (Oral)   Resp 16   SpO2 97%  Gen:   Awake, no distress   Resp:  Normal effort  MSK:   Moves extremities without difficulty  Other:  Questionably positive Finkelstein's  Medical Decision Making  Medically screening exam initiated at 2:01 PM.  Appropriate orders placed.  Margaret Webster was informed that the remainder of the evaluation will be completed by another provider, this initial triage assessment does not replace that evaluation, and the importance of remaining in the ED until their evaluation is complete.  X-ray obtained.   Pati Gallo [place], [place] 06/22/21 1402    Lacretia Leigh, MD 06/25/21 1447

## 2021-06-26 ENCOUNTER — Encounter: Payer: Self-pay | Admitting: Nurse Practitioner

## 2021-06-26 ENCOUNTER — Other Ambulatory Visit: Payer: Self-pay

## 2021-06-26 ENCOUNTER — Ambulatory Visit (INDEPENDENT_AMBULATORY_CARE_PROVIDER_SITE_OTHER): Payer: Medicare Other | Admitting: Nurse Practitioner

## 2021-06-26 VITALS — BP 132/70 | HR 63 | Temp 98.4°F | Ht 66.2 in | Wt 224.4 lb

## 2021-06-26 DIAGNOSIS — M659 Synovitis and tenosynovitis, unspecified: Secondary | ICD-10-CM | POA: Diagnosis not present

## 2021-06-26 DIAGNOSIS — K5909 Other constipation: Secondary | ICD-10-CM

## 2021-06-26 MED ORDER — TRIAMCINOLONE ACETONIDE 40 MG/ML IJ SUSP
40.0000 mg | Freq: Once | INTRAMUSCULAR | Status: AC
  Administered 2021-06-26: 40 mg via INTRAMUSCULAR

## 2021-06-26 NOTE — Patient Instructions (Signed)
Hand Pain Many things can cause hand pain. Some common causes are: An injury. Repeating the same movement with your hand over and over (overuse). Osteoporosis. Arthritis. Lumps in the tendons or joints of the hand and wrist (ganglion cysts). Nerve compression syndromes (carpal tunnel syndrome). Inflammation of the tendons (tendinitis). Infection. Follow these instructions at home: Pay attention to any changes in your symptoms. Take these actions to help with your discomfort: Managing pain, stiffness, and swelling  Take over-the-counter and prescription medicines only as told by your health care provider. Wear a hand splint or support as told by your health care provider. If directed, put ice on the affected area: Put ice in a plastic bag. Place a towel between your skin and the bag. Leave the ice on for 20 minutes, 2-3 times a day. Activity Take breaks from repetitive activity often. Avoid activities that make your pain worse. Minimize stress on your hands and wrists as much as possible. Do stretches or exercises as told by your health care provider. Do not do activities that make your pain worse. Contact a health care provider if: Your pain does not get better after a few days of self-care. Your pain gets worse. Your pain affects your ability to do your daily activities. Get help right away if: Your hand becomes warm, red, or swollen. Your hand is numb or tingling. Your hand is extremely swollen or deformed. Your hand or fingers turn white or blue. You cannot move your hand, wrist, or fingers. Summary Many things can cause hand pain. Contact your health care provider if your pain does not get better after a few days of self care. Minimize stress on your hands and wrists as much as possible. Do not do activities that make your pain worse. This information is not intended to replace advice given to you by your health care provider. Make sure you discuss any questions you have  with your health care provider. Document Revised: 01/31/2021 Document Reviewed: 01/31/2021 Elsevier Patient Education  [place].

## 2021-06-26 NOTE — Progress Notes (Signed)
I,Tianna Badgett,acting as a Education administrator for [employer], NP.,have documented all relevant documentation on the behalf of [employer], NP,as directed by  Bary Castilla, NP while in the presence of Bary Castilla, NP.  This visit occurred during the SARS-CoV-2 public health emergency.  Safety protocols were in place, including screening questions prior to the visit, additional usage of staff PPE, and extensive cleaning of exam room while observing appropriate contact time as indicated for disinfecting solutions.  Subjective:     Patient ID: Margaret Webster , female    DOB: [birth year] , 60 y.o.   MRN: [id number]   Chief Complaint  Patient presents with   Hand Pain    HPI  Patient is here for ED follow up for hand pain. She does follow up with the hand doctor. She was in the ED for the pain and they gave her Tylenol and she is still having pain. She has a history of hand pain.   Hand Pain  The incident occurred 5 to 7 days ago. The injury mechanism is unknown. The pain is present in the left wrist. The quality of the pain is described as aching. The pain does not radiate. The pain is at a severity of 6/10. The pain is moderate. The pain has been Fluctuating since the incident. Pertinent negatives include no chest pain. The symptoms are aggravated by movement. She has tried acetaminophen for the symptoms. The treatment provided mild relief.    Past Medical History:  Diagnosis Date   Arthritis    bil shoulders   Bronchitis    Chronic back pain    Chronic kidney disease    Diabetes mellitus    stopped Mrtformin 1 mo ago, made her "sick"   Hypertension    Hypothyroidism    Stroke [facility])    Thyroid disease    Trigger thumb of right hand      Family History  Problem Relation Age of Onset   Diabetes Mother    Cystic fibrosis Sister    Cancer Sister        ?   Heart disease Maternal Grandmother    Clotting disorder Maternal Grandfather      Current Outpatient  Medications:    amLODipine (NORVASC) 2.5 MG tablet, Take 1 tablet (2.5 mg total) by mouth daily., Disp: 30 tablet, Rfl: 2   aspirin EC 81 MG tablet, Take 81 mg by mouth once., Disp: , Rfl:    HYDROcodone-acetaminophen (NORCO) 5-325 MG tablet, 1-2 tabs po q6 hours prn pain, Disp: 20 tablet, Rfl: 0   losartan (COZAAR) 25 MG tablet, Take 12.5 mg by mouth daily. With potassium, Disp: , Rfl:    metFORMIN (GLUCOPHAGE-XR) 500 MG 24 hr tablet, Take 1 tablet by mouth once daily with breakfast, Disp: 90 tablet, Rfl: 1   NEXLETOL 180 MG TABS, TAKE 1 TABLET BY MOUTH AT BEDTIME, Disp: 90 tablet, Rfl: 1   nystatin cream (MYCOSTATIN), Apply 1 application topically 2 (two) times daily., Disp: 30 g, Rfl: 0   nystatin-triamcinolone ointment (MYCOLOG), Apply 1 application topically 2 (two) times daily., Disp: 30 g, Rfl: 0   oxyCODONE (OXY IR/ROXICODONE) 5 MG immediate release tablet, Take 1 tablet (5 mg total) by mouth every 4 (four) hours as needed for up to 7 days. (Patient not taking: Reported on 04/24/2021), Disp: 30 tablet, Rfl: 0   triamcinolone cream (KENALOG) 0.5 %, Apply 1 application topically 2 (two) times daily., Disp: 30 g, Rfl: 0   UNABLE TO FIND, Hair,skin and  nails : one tablet daily (Patient not taking: Reported on 04/24/2021), Disp: , Rfl:   Current Facility-Administered Medications:    triamcinolone acetonide (KENALOG-40) injection 40 mg, 40 mg, Intramuscular, Once, Clark Clowdus, NP   Allergies  Allergen Reactions   Sulfa Antibiotics Other (See Comments)    Hard to breathe   Crestor [Rosuvastatin] Other (See Comments)    Makes her mouth numb and causes side pain   Ibuprofen Other (See Comments)    Stomach upset   Polytrim [Polymyxin B-Trimethoprim] Itching and Swelling    redness   Influenza Vaccines Hives   Lipitor [Atorvastatin Calcium] Other (See Comments)    Muscle ache    Naproxen Nausea And Vomiting     Review of Systems  Constitutional: Negative.  Negative for fatigue.   HENT:  Negative for congestion, rhinorrhea and sinus pain.   Respiratory: Negative.  Negative for cough and wheezing.   Cardiovascular: Negative.  Negative for chest pain and palpitations.  Gastrointestinal:  Positive for constipation.  Musculoskeletal:  Positive for arthralgias.       Pain in left hand,wrist area   Neurological: Negative.  Negative for weakness and headaches.    Today's Vitals   06/26/21 1505  BP: 132/70  Pulse: 63  Temp: 98.4 F (36.9 C)  TempSrc: Oral  Weight: 224 lb 6.4 oz (101.8 kg)  Height: 5' 6.2" (1.681 m)   Body mass index is 36 kg/m.  Wt Readings from Last 3 Encounters:  06/26/21 224 lb 6.4 oz (101.8 kg)  04/24/21 225 lb 6.4 oz (102.2 kg)  03/21/21 221 lb 12.5 oz (100.6 kg)    Objective:  Physical Exam Constitutional:      Appearance: Normal appearance.  HENT:     Head: Normocephalic and atraumatic.  Cardiovascular:     Rate and Rhythm: Normal rate and regular rhythm.     Pulses: Normal pulses.     Heart sounds: Normal heart sounds. No murmur heard. Pulmonary:     Effort: Pulmonary effort is normal. No respiratory distress.     Breath sounds: Normal breath sounds. No wheezing.  Musculoskeletal:     Left wrist: Tenderness present. No snuff box tenderness. Decreased range of motion.     Left hand: Tenderness present. Decreased range of motion. Decreased strength.  Skin:    General: Skin is warm and dry.     Capillary Refill: Capillary refill takes less than 2 seconds.  Neurological:     Mental Status: She is alert.        Assessment And Plan:     1. Tenosynovitis of left wrist - triamcinolone acetonide (KENALOG-40) injection 40 mg -Advised patient to take tylenol as needed for pain -Patient has appt to see Hand specialist.   2. Other constipation -Given samples of Linzess in the office. Patient denies any allergies to the medication.  -Side effects and appropriate use of all the medication(s) were discussed with the patient today.  Patient advised to use the medication(s) as directed by their healthcare provider. The patient was encouraged to read, review, and understand all associated package inserts and contact our office with any questions or concerns. The patient accepts the risks of the treatment plan and had an opportunity to ask questions.  - Ambulatory referral to Gastroenterology   The patient was encouraged to call or send a message through [place] for any questions or concerns.   Follow up: if symptoms persist or do not get better.   Staying healthy and adopting a healthy lifestyle  for your overall health is important. You should eat 7 or more servings of fruits and vegetables per day. You should drink plenty of water to keep yourself hydrated and your kidneys healthy. This includes about 65-80+ fluid ounces of water. Limit your intake of animal fats especially for elevated cholesterol. Avoid highly processed food and limit your salt intake if you have hypertension. Avoid foods high in saturated/Trans fats. Along with a healthy diet it is also very important to maintain time for yourself to maintain a healthy mental health with low stress levels. You should get atleast 150 min of moderate intensity exercise weekly for a healthy heart. Along with eating right and exercising, aim for at least 7-9 hours of sleep daily.  Eat more whole grains which includes barley, wheat berries, oats, Stickle rice and whole wheat pasta. Use healthy plant oils which include olive, soy, corn, sunflower and peanut. Limit your caffeine and sugary drinks. Limit your intake of fast foods. Limit milk and dairy products to one or two daily servings.   Patient was given opportunity to ask questions. Patient verbalized understanding of the plan and was able to repeat key elements of the plan. All questions were answered to their satisfaction.  Raman Jerolyn Flenniken, DNP   I, Raman Rachell Druckenmiller have reviewed 06/26/21 all documentation for this visit. The  documentation on /22 for the exam, diagnosis, procedures, and orders are all accurate and complete.    IF YOU HAVE BEEN REFERRED TO A SPECIALIST, IT MAY TAKE 1-2 WEEKS TO SCHEDULE/PROCESS THE REFERRAL. IF YOU HAVE NOT HEARD FROM US/SPECIALIST IN TWO WEEKS, PLEASE GIVE [place] A CALL AT [phone] X [phone].   THE PATIENT IS ENCOURAGED TO PRACTICE SOCIAL DISTANCING DUE TO THE COVID-19 PANDEMIC.

## 2021-07-08 ENCOUNTER — Other Ambulatory Visit: Payer: Self-pay

## 2021-07-08 ENCOUNTER — Ambulatory Visit
Admission: RE | Admit: 2021-07-08 | Discharge: 2021-07-08 | Disposition: A | Discharge status: Home / Self Care | Payer: Medicare Other | Source: Ambulatory Visit | Attending: Internal Medicine | Admitting: Internal Medicine

## 2021-07-08 DIAGNOSIS — Z1231 Encounter for screening mammogram for malignant neoplasm of breast: Secondary | ICD-10-CM | POA: Diagnosis not present

## 2021-07-09 ENCOUNTER — Other Ambulatory Visit: Payer: Self-pay | Admitting: Nurse Practitioner

## 2021-07-09 DIAGNOSIS — E782 Mixed hyperlipidemia: Secondary | ICD-10-CM

## 2021-07-09 DIAGNOSIS — R7303 Prediabetes: Secondary | ICD-10-CM

## 2021-07-09 DIAGNOSIS — Z8673 Personal history of transient ischemic attack (TIA), and cerebral infarction without residual deficits: Secondary | ICD-10-CM

## 2021-07-09 DIAGNOSIS — Z789 Other specified health status: Secondary | ICD-10-CM

## 2021-07-14 ENCOUNTER — Ambulatory Visit (HOSPITAL_COMMUNITY)
Admission: EM | Admit: 2021-07-14 | Discharge: 2021-07-14 | Disposition: A | Discharge status: Home / Self Care | Payer: Medicare Other | Attending: Emergency Medicine | Admitting: Emergency Medicine

## 2021-07-14 ENCOUNTER — Other Ambulatory Visit: Payer: Self-pay

## 2021-07-14 ENCOUNTER — Encounter (HOSPITAL_COMMUNITY): Payer: Self-pay

## 2021-07-14 DIAGNOSIS — Z20822 Contact with and (suspected) exposure to covid-19: Secondary | ICD-10-CM | POA: Insufficient documentation

## 2021-07-14 DIAGNOSIS — M546 Pain in thoracic spine: Secondary | ICD-10-CM | POA: Insufficient documentation

## 2021-07-14 DIAGNOSIS — J029 Acute pharyngitis, unspecified: Secondary | ICD-10-CM | POA: Diagnosis not present

## 2021-07-14 DIAGNOSIS — H6691 Otitis media, unspecified, right ear: Secondary | ICD-10-CM | POA: Insufficient documentation

## 2021-07-14 DIAGNOSIS — J069 Acute upper respiratory infection, unspecified: Secondary | ICD-10-CM | POA: Insufficient documentation

## 2021-07-14 LAB — SARS CORONAVIRUS 2 (TAT 6-24 HRS): SARS Coronavirus 2: NEGATIVE

## 2021-07-14 MED ORDER — CEPACOL INSTAMAX 15-20 MG MT LOZG
1.0000 | LOZENGE | Freq: Three times a day (TID) | OROMUCOSAL | 0 refills | Status: DC | PRN
Start: 2021-07-14 — End: 2021-12-04

## 2021-07-14 MED ORDER — KETOROLAC TROMETHAMINE 60 MG/2ML IM SOLN
60.0000 mg | Freq: Once | INTRAMUSCULAR | Status: AC
  Administered 2021-07-14: 60 mg via INTRAMUSCULAR

## 2021-07-14 MED ORDER — FLUTICASONE PROPIONATE 50 MCG/ACT NA SUSP: 2.0000 | Freq: Every day | NASAL | 0 refills | Status: DC

## 2021-07-14 MED ORDER — KETOROLAC TROMETHAMINE 60 MG/2ML IM SOLN
INTRAMUSCULAR | Status: AC
  Filled 2021-07-14: qty 2

## 2021-07-14 MED ORDER — AMOXICILLIN-POT CLAVULANATE 875-125 MG PO TABS: 1.0000 | ORAL_TABLET | Freq: Two times a day (BID) | ORAL | 0 refills | Status: DC

## 2021-07-14 NOTE — ED Provider Notes (Signed)
[place]    CSN: [id number] Arrival date & time: 07/14/21  1448      History   Chief Complaint Chief Complaint  Patient presents with   URI   Generalized Body Aches    HPI Margaret Webster is a 60 y.o. female.   HPI Margaret Webster is a 60 y.o. female presenting to UC with c/o sore throat, nasal congestion, Right side ear pain and body aches, worse in Right upper back and side for 1 week.  Pt reports coughing and thinks that has contributed to her side/back pain but notes the pain makes it hard to wear a regular bra. Hx of shingles in lower lumbar spine in the past.  She has not noticed any rashes since current symptoms started.   Pain in ear is aching and sore, radiating into Right side of neck.  Throat pain worse with swallowing. She has taken tylenol with temporary relief. She has received 3 doses of COVID vaccine but notes her sister recommended she be tested for COVID.  No known sick contacts.     Past Medical History:  Diagnosis Date   Arthritis    bil shoulders   Bronchitis    Chronic back pain    Chronic kidney disease    Diabetes mellitus    stopped Mrtformin 1 mo ago, made her "sick"   Hypertension    Hypothyroidism    Stroke [facility])    Thyroid disease    Trigger thumb of right hand     Patient Active Problem List   Diagnosis Date Noted   Depression 07/05/2020   Ringing in ears 07/05/2020   CVA (cerebral vascular accident) ([place]) 02/26/2020   CKD (chronic kidney disease) stage 3, GFR 30-59 ml/min (HCC)    Bradycardia    Elevated TSH    Multiple joint pain 11/07/2019   Acute pain of right shoulder 09/19/2019   Right hip pain 09/19/2019   Prediabetes 09/08/2018   Mass of joint of finger 07/26/2015   Chest pain 01/10/2015   DM (diabetes mellitus) ([place]) 01/10/2015   Hypertension 01/10/2015   Hypothyroidism 01/10/2015   Headache 01/10/2015   Abdominal pain 01/10/2015   Vaginitis 04/07/2014   Pain 04/07/2014   Vaginal irritation 04/07/2014    Boil of buttock 04/06/2014   BV (bacterial vaginosis) 04/06/2014   Unspecified symptom associated with female genital organs 11/16/2013   Candidiasis of vulva and vagina 11/16/2013   Vaginitis and vulvovaginitis, unspecified 06/07/2013   Pain aggravated by activities of daily living 06/07/2013   S/P cervical spinal fusion 01/17/2013   Cervical radiculopathy 12/13/2012   Carpal tunnel syndrome on both sides 12/06/2012   Cervical spondylosis with myelopathy 12/06/2012   Nonspecific abnormal finding in stool contents 12/02/2011   Diabetes mellitus without mention of complication [id number]   Chronic back pain     Past Surgical History:  Procedure Laterality Date   [place]  08/2015   CARPAL TUNNEL RELEASE Left 09/10/2017   Procedure: CARPAL TUNNEL RELEASE;  Surgeon: Leanora Cover, MD;  Location: [facility];  Service: Orthopedics;  Laterality: Left;   CERVICAL SPINE SURGERY Right 11/2012   OOPHORECTOMY     TRIGGER FINGER RELEASE Right 08/06/2017   Procedure: RIGHT THUMB TRIGGER RELEASE;  Surgeon: Leanora Cover, MD;  Location: [facility];  Service: Orthopedics;  Laterality: Right;   ULNAR COLLATERAL LIGAMENT REPAIR Right 03/21/2021   Procedure: RIGHT THUMB ULNAR COLLATERAL LIGAMENT REPAIR;  Surgeon: Leanora Cover, MD;  Location: [facility];  Service: Orthopedics;  Laterality: Right;    OB History     Gravida  1   Para  0   Term  0   Preterm  0   AB  0   Living  1      SAB  0   IAB  0   Ectopic  0   Multiple  0   Live Births  1            Home Medications    Prior to Admission medications   Medication Sig Start Date End Date Taking? Authorizing Provider  amoxicillin-clavulanate (AUGMENTIN) 875-125 MG tablet Take 1 tablet by mouth every 12 (twelve) hours. 07/14/21  Yes Valissa Lyvers O, PA-C  Benzocaine-Menthol (CEPACOL INSTAMAX) 15-20 MG LOZG Use as directed 1 lozenge in the mouth or  throat 3 (three) times daily as needed. 07/14/21  Yes Brodee Mauritz O, PA-C  fluticasone (FLONASE) 50 MCG/ACT nasal spray Place 2 sprays into both nostrils daily. 07/14/21  Yes Marshel Golubski O, PA-C  amLODipine (NORVASC) 2.5 MG tablet Take 1 tablet (2.5 mg total) by mouth daily. 03/26/21 03/26/22  [name], FNP  aspirin EC 81 MG tablet Take 81 mg by mouth once.    [provider]  HYDROcodone-acetaminophen (NORCO) 5-325 MG tablet 1-2 tabs po q6 hours prn pain 03/21/21   Leanora Cover, MD  losartan (COZAAR) 25 MG tablet Take 12.5 mg by mouth daily. With potassium 03/08/21   [provider]  metFORMIN (GLUCOPHAGE-XR) 500 MG 24 hr tablet Take 1 tablet by mouth once daily with breakfast 07/09/21   [name], FNP  NEXLETOL 180 MG TABS TAKE 1 TABLET BY MOUTH AT BEDTIME 07/09/21   [name], FNP  nystatin cream (MYCOSTATIN) Apply 1 application topically 2 (two) times daily. 11/01/20   [name], FNP  nystatin-triamcinolone ointment [facility]) Apply 1 application topically 2 (two) times daily. 10/25/20   [name], FNP  oxyCODONE (OXY IR/ROXICODONE) 5 MG immediate release tablet Take 1 tablet (5 mg total) by mouth every 4 (four) hours as needed for up to 7 days. Patient not taking: Reported on 04/24/2021 03/29/21     triamcinolone cream (KENALOG) 0.5 % Apply 1 application topically 2 (two) times daily. 11/01/20   [name], FNP  UNABLE TO FIND Hair,skin and nails : one tablet daily Patient not taking: Reported on 04/24/2021    [provider]    Family History Family History  Problem Relation Age of Onset   Diabetes Mother    Cystic fibrosis Sister    Cancer Sister        ?   Heart disease Maternal Grandmother    Clotting disorder Maternal Grandfather     Social History Social History   Tobacco Use   Smoking status: Every Day    Packs/day: 0.25    Types: Cigarettes   Smokeless tobacco: Never   Tobacco comments:    4 cig a day, not interested in chantix   Vaping Use   Vaping Use: Never used  Substance Use Topics   Alcohol use: Yes    Comment: occas   Drug use: Yes    Types: Marijuana     Allergies   Sulfa antibiotics, Crestor [rosuvastatin], Ibuprofen, Polytrim [polymyxin b-trimethoprim], Influenza vaccines, Lipitor [atorvastatin calcium], and Naproxen   Review of Systems Review of Systems  Constitutional:  Negative for chills and fever.  HENT:  Positive for congestion, ear pain  and sore throat. Negative for rhinorrhea, trouble swallowing and voice change.   Respiratory:  Positive for cough. Negative for chest tightness and shortness of breath.   Gastrointestinal:  Negative for diarrhea, nausea and vomiting.  Musculoskeletal:  Positive for arthralgias and myalgias.  Skin:  Negative for rash.  Neurological:  Negative for dizziness, facial asymmetry, weakness, light-headedness, numbness and headaches.    Physical Exam Triage Vital Signs ED Triage Vitals  Enc Vitals Group     BP 07/14/21 1523 (!) 157/62     Pulse Rate 07/14/21 1523 64     Resp 07/14/21 1523 17     Temp 07/14/21 1523 98.6 F (37 C)     Temp Source 07/14/21 1523 Oral     SpO2 07/14/21 1523 100 %     Weight --      Height --      Head Circumference --      Peak Flow --      Pain Score 07/14/21 1522 7     Pain Loc --      Pain Edu? --      Excl. in [place]? --    No data found.  Updated Vital Signs BP (!) 157/62 (BP Location: Right Arm)   Pulse 64   Temp 98.6 F (37 C) (Oral)   Resp 17   SpO2 100%   Visual Acuity Right Eye Distance:   Left Eye Distance:   Bilateral Distance:    Right Eye Near:   Left Eye Near:    Bilateral Near:     Physical Exam Vitals and nursing note reviewed.  Constitutional:      General: She is not in acute distress.    Appearance: Normal appearance. She is well-developed. She is not ill-appearing, toxic-appearing or diaphoretic.  HENT:     Head: Normocephalic and atraumatic.     Jaw: There is normal jaw occlusion.  Tenderness present. No trismus or swelling.      Right Ear: Ear canal normal. Tympanic membrane is erythematous (superior portion of TM). Tympanic membrane is not bulging.     Left Ear: Tympanic membrane and ear canal normal.     Nose: Nose normal.     Right Sinus: No maxillary sinus tenderness or frontal sinus tenderness.     Left Sinus: No maxillary sinus tenderness or frontal sinus tenderness.     Mouth/Throat:     Lips: Pink.     Mouth: Mucous membranes are moist.     Pharynx: Oropharynx is clear. Uvula midline.  Eyes:     Extraocular Movements: Extraocular movements intact.     Conjunctiva/sclera: Conjunctivae normal.     Pupils: Pupils are equal, round, and reactive to light.  Neck:     Vascular: No carotid bruit.  Cardiovascular:     Rate and Rhythm: Normal rate and regular rhythm.  Pulmonary:     Effort: Pulmonary effort is normal. No respiratory distress.     Breath sounds: Normal breath sounds. No stridor. No wheezing, rhonchi or rales.  Musculoskeletal:        General: Tenderness present. No swelling. Normal range of motion.       Arms:     Cervical back: Normal range of motion and neck supple. No rigidity or tenderness.  Lymphadenopathy:     Cervical: Cervical adenopathy (right side) present.  Skin:    General: Skin is warm and dry.     Capillary Refill: Capillary refill takes less than 2 seconds.  Findings: No rash.  Neurological:     General: No focal deficit present.     Mental Status: She is alert and oriented to person, place, and time.     Cranial Nerves: No cranial nerve deficit.     Sensory: No sensory deficit.     Motor: No weakness.     Coordination: Coordination normal.     Gait: Gait normal.  Psychiatric:        Mood and Affect: Mood normal.        Behavior: Behavior normal.     UC Treatments / Results  Labs (all labs ordered are listed, but only abnormal results are displayed) Labs Reviewed  SARS CORONAVIRUS 2 (TAT 6-24 HRS)     EKG   Radiology No results found.  Procedures Procedures (including critical care time)  Medications Ordered in UC Medications  ketorolac (TORADOL) injection 60 mg (60 mg Intramuscular Given 07/14/21 1617)    Initial Impression / Assessment and Plan / UC Course  I have reviewed the triage vital signs and the nursing notes.  Pertinent labs & imaging results that were available during my care of the patient were reviewed by me and considered in my medical decision making (see chart for details).     1 week of URI symptoms COVID test pending Right TM: erythema to superior aspect. No effusion or perforation. Rx: Augmentin, Flonase, and Cepacol Encouraged close f/u with PCP, especially if worsening symptoms or rash appear Discussed symptoms that warrant emergent care in the ED. AVS given  Final Clinical Impressions(s) / UC Diagnoses   Final diagnoses:  Right acute otitis media  Acute right-sided thoracic back pain  Acute URI  Acute pharyngitis, unspecified etiology     Discharge Instructions       Please take antibiotics as prescribed and be sure to complete entire course even if you start to feel better to ensure infection does not come back.  You may continue to take acetaminophen as needed for pain.  You can also take the cepacol throat lozenges and use warm saltwater gargles to help with throat pain.    Your back/side pain could be due to muscle strain from recent coughing, but there is also a chance the pain is due to early shingles.  If you develop a rash, seek medical attention immediately for proper diagnosis and treatment.    Follow up with primary care in 2-3 days if not improving.  Call 911 or go to the emergency department if you develop worsening ear pain, or new symptoms including severe headache, dizziness, change in vision, difficulty breathing or swallowing, or other new concerning symptoms develop.      ED Prescriptions     Medication Sig  Dispense Auth. Provider   amoxicillin-clavulanate (AUGMENTIN) 875-125 MG tablet Take 1 tablet by mouth every 12 (twelve) hours. 14 tablet Cella Cappello O, PA-C   Benzocaine-Menthol (CEPACOL INSTAMAX) 15-20 MG LOZG Use as directed 1 lozenge in the mouth or throat 3 (three) times daily as needed. 16 lozenge Crist Kruszka O, PA-C   fluticasone (FLONASE) 50 MCG/ACT nasal spray Place 2 sprays into both nostrils daily. 16 g Noe Gens, [place]      PDMP not reviewed this encounter.   Noe Gens, PA-C 07/14/21 1710

## 2021-07-14 NOTE — ED Triage Notes (Signed)
Pt presents with generalized body aches and sore throat X 1 week.

## 2021-07-14 NOTE — Discharge Instructions (Signed)
  Please take antibiotics as prescribed and be sure to complete entire course even if you start to feel better to ensure infection does not come back.  You may continue to take acetaminophen as needed for pain.  You can also take the cepacol throat lozenges and use warm saltwater gargles to help with throat pain.    Your back/side pain could be due to muscle strain from recent coughing, but there is also a chance the pain is due to early shingles.  If you develop a rash, seek medical attention immediately for proper diagnosis and treatment.    Follow up with primary care in 2-3 days if not improving.  Call 911 or go to the emergency department if you develop worsening ear pain, or new symptoms including severe headache, dizziness, change in vision, difficulty breathing or swallowing, or other new concerning symptoms develop.

## 2021-07-22 ENCOUNTER — Emergency Department (HOSPITAL_COMMUNITY)
Admission: EM | Admit: 2021-07-22 | Discharge: 2021-07-23 | Disposition: A | Discharge status: Home / Self Care | Payer: Medicare Other | Attending: Emergency Medicine | Admitting: Emergency Medicine

## 2021-07-22 DIAGNOSIS — E039 Hypothyroidism, unspecified: Secondary | ICD-10-CM | POA: Diagnosis not present

## 2021-07-22 DIAGNOSIS — R059 Cough, unspecified: Secondary | ICD-10-CM | POA: Diagnosis not present

## 2021-07-22 DIAGNOSIS — R109 Unspecified abdominal pain: Secondary | ICD-10-CM | POA: Insufficient documentation

## 2021-07-22 DIAGNOSIS — Z7982 Long term (current) use of aspirin: Secondary | ICD-10-CM | POA: Diagnosis not present

## 2021-07-22 DIAGNOSIS — I129 Hypertensive chronic kidney disease with stage 1 through stage 4 chronic kidney disease, or unspecified chronic kidney disease: Secondary | ICD-10-CM | POA: Insufficient documentation

## 2021-07-22 DIAGNOSIS — N183 Chronic kidney disease, stage 3 unspecified: Secondary | ICD-10-CM | POA: Insufficient documentation

## 2021-07-22 DIAGNOSIS — R0789 Other chest pain: Secondary | ICD-10-CM | POA: Diagnosis not present

## 2021-07-22 DIAGNOSIS — M542 Cervicalgia: Secondary | ICD-10-CM | POA: Diagnosis not present

## 2021-07-22 DIAGNOSIS — J209 Acute bronchitis, unspecified: Secondary | ICD-10-CM | POA: Insufficient documentation

## 2021-07-22 DIAGNOSIS — Z7984 Long term (current) use of oral hypoglycemic drugs: Secondary | ICD-10-CM | POA: Diagnosis not present

## 2021-07-22 DIAGNOSIS — F17210 Nicotine dependence, cigarettes, uncomplicated: Secondary | ICD-10-CM | POA: Insufficient documentation

## 2021-07-22 DIAGNOSIS — Z79899 Other long term (current) drug therapy: Secondary | ICD-10-CM | POA: Insufficient documentation

## 2021-07-22 DIAGNOSIS — E1122 Type 2 diabetes mellitus with diabetic chronic kidney disease: Secondary | ICD-10-CM | POA: Insufficient documentation

## 2021-07-22 DIAGNOSIS — M25519 Pain in unspecified shoulder: Secondary | ICD-10-CM | POA: Insufficient documentation

## 2021-07-23 ENCOUNTER — Telehealth: Payer: Self-pay

## 2021-07-23 ENCOUNTER — Encounter (HOSPITAL_COMMUNITY): Payer: Self-pay | Admitting: Emergency Medicine

## 2021-07-23 ENCOUNTER — Emergency Department (HOSPITAL_COMMUNITY): Payer: Medicare Other

## 2021-07-23 ENCOUNTER — Other Ambulatory Visit: Payer: Self-pay

## 2021-07-23 DIAGNOSIS — R059 Cough, unspecified: Secondary | ICD-10-CM | POA: Diagnosis not present

## 2021-07-23 DIAGNOSIS — R0789 Other chest pain: Secondary | ICD-10-CM | POA: Diagnosis not present

## 2021-07-23 MED ORDER — ACETAMINOPHEN 325 MG PO TABS
650.0000 mg | ORAL_TABLET | Freq: Once | ORAL | Status: AC
  Administered 2021-07-23: 650 mg via ORAL
  Filled 2021-07-23: qty 2

## 2021-07-23 MED ORDER — PREDNISONE 20 MG PO TABS
60.0000 mg | ORAL_TABLET | Freq: Once | ORAL | Status: AC
  Administered 2021-07-23: 60 mg via ORAL
  Filled 2021-07-23: qty 3

## 2021-07-23 MED ORDER — BENZONATATE 100 MG PO CAPS: 100.0000 mg | ORAL_CAPSULE | Freq: Three times a day (TID) | ORAL | 0 refills | Status: DC | PRN

## 2021-07-23 MED ORDER — PREDNISONE 20 MG PO TABS: ORAL_TABLET | ORAL | 0 refills | Status: DC

## 2021-07-23 MED ORDER — PREDNISONE 20 MG PO TABS: 40.0000 mg | ORAL_TABLET | Freq: Every day | ORAL | 0 refills | Status: DC

## 2021-07-23 MED ORDER — ALBUTEROL SULFATE HFA 108 (90 BASE) MCG/ACT IN AERS
6.0000 | INHALATION_SPRAY | Freq: Once | RESPIRATORY_TRACT | Status: AC
  Administered 2021-07-23: 6 via RESPIRATORY_TRACT
  Filled 2021-07-23: qty 6.7

## 2021-07-23 NOTE — ED Provider Notes (Signed)
Emergency Medicine Provider Triage Evaluation Note  Margaret Webster , a 60 y.o. female  was evaluated in triage.  Pt complains of sinus congestion, chest congestion, cough. Cough productive of phlegm. Symptoms began 12 days ago. Seen at [facility] and given Amoxicillin and Flonase. Took last dose of abx today. Also using Tylenol, Mucinex, Sudafed without relief. No fevers. Reports negative COVID at Urgent Care. Smokes 1 pack q 3 days. Take daily lisinopril.  Review of Systems  Positive: Cough, sinus and chest congestion Negative: Fever  Physical Exam  BP (!) 149/89 (BP Location: Right Arm)   Pulse 62   Temp 98.6 F (37 C) (Oral)   Resp 18   Ht 5\' 7"  (1.702 m)   Wt 105 kg   SpO2 98%   BMI 36.26 kg/m  Gen:   Awake, no distress   Resp:  Normal effort  MSK:   Moves extremities without difficulty  Other:  Expiratory wheeze. Spastic, dry, hacking cough at bedside.  Medical Decision Making  Medically screening exam initiated at 12:15 AM.  Appropriate orders placed.  ROSARIO DUEY was informed that the remainder of the evaluation will be completed by another provider, this initial triage assessment does not replace that evaluation, and the importance of remaining in the ED until their evaluation is complete.  Cough   Antonietta Breach, PA-C 07/23/21 0017    Merryl Hacker, MD 07/23/21 [phone]

## 2021-07-23 NOTE — Discharge Instructions (Addendum)
Use the albuterol inhaler 2 puffs every 4 hours as needed for cough, shortness of breath, or wheezing.  If he needed significantly more than this then return to the ER or see your doctor.  You were given your first dose of steroids today, start the rest of the prescription tomorrow, 9/28  It is very important to stop smoking.  Follow-up with your primary care physician as you may need outpatient lung testing.  If you develop fever, coughing up blood, new or worsening shortness of breath, or any other new/concerning symptoms the return to the ER for evaluation.

## 2021-07-23 NOTE — ED Provider Notes (Signed)
[facility] EMERGENCY DEPARTMENT Provider Note   CSN: [id number] Arrival date & time: 07/22/21  2353     History Chief Complaint  Patient presents with   Cough    Margaret Webster is a 60 y.o. female.  HPI 60 year old female presents with cough and wheezing.  Overall her symptoms have been about 13 days.  She went to urgent care a few days after started and was put on Augmentin, benzocaine-menthol and fluticasone.  She was told she had an ear/sinus infection.  She just finished the antibiotics.  Symptoms do not seem to be improving and seem to be worse a little bit.  Her cough has now gone from clear sputum to green sputum and she has been having wheezing the whole time.  Not really having shortness of breath though.  No new leg swelling though she has chronic right pedal edema.  She does continue to smoke.  She was given albuterol and prednisone in triage and states that she is feeling significantly better.  She is having some right-sided abdominal wall, chest, shoulder and neck pain.  She thinks this might be from coughing.  This has been ongoing the whole time.  Past Medical History:  Diagnosis Date   Arthritis    bil shoulders   Bronchitis    Chronic back pain    Chronic kidney disease    Diabetes mellitus    stopped Mrtformin 1 mo ago, made her "sick"   Hypertension    Hypothyroidism    Stroke [facility])    Thyroid disease    Trigger thumb of right hand     Patient Active Problem List   Diagnosis Date Noted   Depression 07/05/2020   Ringing in ears 07/05/2020   CVA (cerebral vascular accident) ([place]) 02/26/2020   CKD (chronic kidney disease) stage 3, GFR 30-59 ml/min (HCC)    Bradycardia    Elevated TSH    Multiple joint pain 11/07/2019   Acute pain of right shoulder 09/19/2019   Right hip pain 09/19/2019   Prediabetes 09/08/2018   Mass of joint of finger 07/26/2015   Chest pain 01/10/2015   DM (diabetes mellitus) ([place]) 01/10/2015   Hypertension  01/10/2015   Hypothyroidism 01/10/2015   Headache 01/10/2015   Abdominal pain 01/10/2015   Vaginitis 04/07/2014   Pain 04/07/2014   Vaginal irritation 04/07/2014   Boil of buttock 04/06/2014   BV (bacterial vaginosis) 04/06/2014   Unspecified symptom associated with female genital organs 11/16/2013   Candidiasis of vulva and vagina 11/16/2013   Vaginitis and vulvovaginitis, unspecified 06/07/2013   Pain aggravated by activities of daily living 06/07/2013   S/P cervical spinal fusion 01/17/2013   Cervical radiculopathy 12/13/2012   Carpal tunnel syndrome on both sides 12/06/2012   Cervical spondylosis with myelopathy 12/06/2012   Nonspecific abnormal finding in stool contents 12/02/2011   Diabetes mellitus without mention of complication 75/07/2584   Chronic back pain     Past Surgical History:  Procedure Laterality Date   [place]  08/2015   CARPAL TUNNEL RELEASE Left 09/10/2017   Procedure: CARPAL TUNNEL RELEASE;  Surgeon: Leanora Cover, MD;  Location: [place];  Service: Orthopedics;  Laterality: Left;   CERVICAL SPINE SURGERY Right 11/2012   OOPHORECTOMY     TRIGGER FINGER RELEASE Right 08/06/2017   Procedure: RIGHT THUMB TRIGGER RELEASE;  Surgeon: Leanora Cover, MD;  Location: [place];  Service: Orthopedics;  Laterality: Right;  ULNAR COLLATERAL LIGAMENT REPAIR Right 03/21/2021   Procedure: RIGHT THUMB ULNAR COLLATERAL LIGAMENT REPAIR;  Surgeon: Leanora Cover, MD;  Location: [place];  Service: Orthopedics;  Laterality: Right;     OB History     Gravida  1   Para  0   Term  0   Preterm  0   AB  0   Living  1      SAB  0   IAB  0   Ectopic  0   Multiple  0   Live Births  1           Family History  Problem Relation Age of Onset   Diabetes Mother    Cystic fibrosis Sister    Cancer Sister        ?   Heart disease Maternal Grandmother    Clotting disorder  Maternal Grandfather     Social History   Tobacco Use   Smoking status: Every Day    Packs/day: 0.25    Types: Cigarettes   Smokeless tobacco: Never   Tobacco comments:    4 cig a day, not interested in chantix  Vaping Use   Vaping Use: Never used  Substance Use Topics   Alcohol use: Yes    Comment: occas   Drug use: Yes    Types: Marijuana    Home Medications Prior to Admission medications   Medication Sig Start Date End Date Taking? Authorizing Provider  predniSONE (DELTASONE) 20 MG tablet 3 tabs po daily x 2 days, then 2 tabs x 3 days, then 1.5 tabs x 3 days, then 1 tab x 3 days, then 0.5 tabs x 3 days 07/24/21  Yes Sherwood Gambler, MD  amLODipine (NORVASC) 2.5 MG tablet Take 1 tablet (2.5 mg total) by mouth daily. 03/26/21 03/26/22  [name], FNP  amoxicillin-clavulanate (AUGMENTIN) 875-125 MG tablet Take 1 tablet by mouth every 12 (twelve) hours. 07/14/21   Noe Gens, PA-C  aspirin EC 81 MG tablet Take 81 mg by mouth once.    [provider]  Benzocaine-Menthol (CEPACOL INSTAMAX) 15-20 MG LOZG Use as directed 1 lozenge in the mouth or throat 3 (three) times daily as needed. 07/14/21   Noe Gens, PA-C  fluticasone (FLONASE) 50 MCG/ACT nasal spray Place 2 sprays into both nostrils daily. 07/14/21   Noe Gens, PA-C  HYDROcodone-acetaminophen [facility]) 5-325 MG tablet 1-2 tabs po q6 hours prn pain 03/21/21   Leanora Cover, MD  losartan (COZAAR) 25 MG tablet Take 12.5 mg by mouth daily. With potassium 03/08/21   [provider]  metFORMIN (GLUCOPHAGE-XR) 500 MG 24 hr tablet Take 1 tablet by mouth once daily with breakfast 07/09/21   [name], FNP  NEXLETOL 180 MG TABS TAKE 1 TABLET BY MOUTH AT BEDTIME 07/09/21   [name], FNP  nystatin cream (MYCOSTATIN) Apply 1 application topically 2 (two) times daily. 11/01/20   [name], FNP  nystatin-triamcinolone ointment [facility]) Apply 1 application topically 2 (two) times daily. 10/25/20   [name], FNP  oxyCODONE (OXY IR/ROXICODONE) 5 MG immediate release tablet Take 1 tablet (5 mg total) by mouth every 4 (four) hours as needed for up to 7 days. Patient not taking: Reported on 04/24/2021 03/29/21     triamcinolone cream (KENALOG) 0.5 % Apply 1 application topically 2 (two) times daily. 11/01/20   [name], FNP  UNABLE TO FIND Hair,skin and nails : one tablet daily Patient not taking: Reported on 04/24/2021  [provider]    Allergies    Sulfa antibiotics, Crestor [rosuvastatin], Ibuprofen, Polytrim [polymyxin b-trimethoprim], Influenza vaccines, Lipitor [atorvastatin calcium], and Naproxen  Review of Systems   Review of Systems  Constitutional:  Negative for fever.  HENT:  Positive for congestion and sore throat.   Respiratory:  Positive for cough and wheezing. Negative for shortness of breath.   Cardiovascular:  Positive for chest pain.  All other systems reviewed and are negative.  Physical Exam Updated Vital Signs BP 133/86   Pulse 89   Temp 97.9 F (36.6 C)   Resp 17   Ht 5\' 7"  (1.702 m)   Wt 105 kg   SpO2 98%   BMI 36.26 kg/m   Physical Exam Vitals and nursing note reviewed. Exam conducted with a chaperone present.  Constitutional:      General: She is not in acute distress.    Appearance: She is well-developed. She is not ill-appearing or diaphoretic.  HENT:     Head: Normocephalic and atraumatic.     Right Ear: External ear normal.     Left Ear: External ear normal.     Nose: Nose normal.  Eyes:     General:        Right eye: No discharge.        Left eye: No discharge.  Cardiovascular:     Rate and Rhythm: Normal rate and regular rhythm.     Heart sounds: Normal heart sounds.  Pulmonary:     Effort: Pulmonary effort is normal.     Breath sounds: Wheezing (slight, expiratory) present.  Chest:     Chest wall: Tenderness (right side) present.  Abdominal:     Palpations: Abdomen is soft.     Tenderness: There is no abdominal  tenderness.  Musculoskeletal:     Right lower leg: No edema.     Left lower leg: No edema.  Skin:    General: Skin is warm and dry.  Neurological:     Mental Status: She is alert.  Psychiatric:        Mood and Affect: Mood is not anxious.    ED Results / Procedures / Treatments   Labs (all labs ordered are listed, but only abnormal results are displayed) Labs Reviewed - No data to display  EKG None  Radiology DG Chest 2 View  Result Date: 07/23/2021 CLINICAL DATA:  Chest pressure, cough EXAM: CHEST - 2 VIEW COMPARISON:  02/26/2020 FINDINGS: The heart size and mediastinal contours are within normal limits. Both lungs are clear. The visualized skeletal structures are unremarkable. IMPRESSION: No active cardiopulmonary disease. Electronically Signed   By: Fidela Salisbury M.D.   On: 07/23/2021 01:13    Procedures Procedures   Medications Ordered in ED Medications  albuterol (VENTOLIN HFA) 108 (90 Base) MCG/ACT inhaler 6 puff (6 puffs Inhalation Given 07/23/21 0021)  predniSONE (DELTASONE) tablet 60 mg (60 mg Oral Given 07/23/21 0022)  acetaminophen (TYLENOL) tablet 650 mg (650 mg Oral Given 07/23/21 0021)    ED Course  I have reviewed the triage vital signs and the nursing notes.  Pertinent labs & imaging results that were available during my care of the patient were reviewed by me and considered in my medical decision making (see chart for details).    MDM Rules/Calculators/A&P                           Overall this seems like bronchitis.  I discussed  that she needs to stop smoking.  It seems like this occurs every time she goes from [place] to [place] and I wonder if she has a chronic form of bronchitis as well.  We will give a prolonged steroid taper given how long her symptoms have been.  She will otherwise need to follow-up with her PCP.  X-ray is clear and I personally reviewed these images.  I doubt bacterial pneumonia and do not think further antibiotics are  warranted.  Will discharge home with return precautions.  I offered ECG given that chest pain but she declines. Final Clinical Impression(s) / ED Diagnoses Final diagnoses:  Acute bronchitis, unspecified organism    Rx / DC Orders ED Discharge Orders          Ordered    predniSONE (DELTASONE) 20 MG tablet  Daily,   Status:  Discontinued        07/23/21 0817    predniSONE (DELTASONE) 20 MG tablet        07/23/21 0818             Sherwood Gambler, MD 07/23/21 [phone]

## 2021-07-23 NOTE — Telephone Encounter (Signed)
Called pt to sch an ED f/u. Left vm for pt to give [place] a call back if she would like to come in.

## 2021-07-23 NOTE — ED Triage Notes (Signed)
Patient reports productive cough / chest congestion and sinus pressure onset last week .

## 2021-07-24 ENCOUNTER — Telehealth: Payer: Self-pay

## 2021-07-24 DIAGNOSIS — H2513 Age-related nuclear cataract, bilateral: Secondary | ICD-10-CM | POA: Diagnosis not present

## 2021-07-24 DIAGNOSIS — G4730 Sleep apnea, unspecified: Secondary | ICD-10-CM | POA: Diagnosis not present

## 2021-07-24 DIAGNOSIS — H04123 Dry eye syndrome of bilateral lacrimal glands: Secondary | ICD-10-CM | POA: Diagnosis not present

## 2021-07-24 DIAGNOSIS — H1045 Other chronic allergic conjunctivitis: Secondary | ICD-10-CM | POA: Diagnosis not present

## 2021-07-24 DIAGNOSIS — E119 Type 2 diabetes mellitus without complications: Secondary | ICD-10-CM | POA: Diagnosis not present

## 2021-07-24 DIAGNOSIS — I639 Cerebral infarction, unspecified: Secondary | ICD-10-CM | POA: Diagnosis not present

## 2021-07-24 LAB — HM DIABETES EYE EXAM

## 2021-07-24 NOTE — Telephone Encounter (Signed)
Error

## 2021-07-24 NOTE — Telephone Encounter (Signed)
The pt said she is feeling better some but that she needed a f/u.

## 2021-07-25 ENCOUNTER — Encounter: Payer: Self-pay | Admitting: Nurse Practitioner

## 2021-07-31 ENCOUNTER — Ambulatory Visit (INDEPENDENT_AMBULATORY_CARE_PROVIDER_SITE_OTHER): Payer: Medicare Other | Admitting: Nurse Practitioner

## 2021-07-31 ENCOUNTER — Encounter: Payer: Self-pay | Admitting: Nurse Practitioner

## 2021-07-31 ENCOUNTER — Other Ambulatory Visit: Payer: Self-pay

## 2021-07-31 VITALS — BP 132/80 | HR 64 | Temp 98.2°F | Ht 67.0 in | Wt 226.6 lb

## 2021-07-31 DIAGNOSIS — J029 Acute pharyngitis, unspecified: Secondary | ICD-10-CM | POA: Diagnosis not present

## 2021-07-31 DIAGNOSIS — J209 Acute bronchitis, unspecified: Secondary | ICD-10-CM | POA: Diagnosis not present

## 2021-07-31 DIAGNOSIS — R21 Rash and other nonspecific skin eruption: Secondary | ICD-10-CM | POA: Diagnosis not present

## 2021-07-31 DIAGNOSIS — R051 Acute cough: Secondary | ICD-10-CM

## 2021-07-31 LAB — POCT RAPID STREP A (OFFICE): Rapid Strep A Screen: NEGATIVE

## 2021-07-31 MED ORDER — CEFTRIAXONE SODIUM 1 G IJ SOLR
1.0000 g | Freq: Once | INTRAMUSCULAR | Status: AC
  Administered 2021-07-31: 1 g via INTRAMUSCULAR

## 2021-07-31 MED ORDER — VALACYCLOVIR HCL 500 MG PO TABS: 500.0000 mg | ORAL_TABLET | Freq: Two times a day (BID) | ORAL | 0 refills | Status: AC

## 2021-07-31 MED ORDER — GABAPENTIN 100 MG PO CAPS: 100.0000 mg | ORAL_CAPSULE | Freq: Every day | ORAL | 2 refills | Status: DC

## 2021-07-31 MED ORDER — GABAPENTIN 100 MG PO CAPS: 100.0000 mg | ORAL_CAPSULE | Freq: Three times a day (TID) | ORAL | 2 refills | Status: DC | PRN

## 2021-07-31 NOTE — Patient Instructions (Signed)
Shingles ?Shingles, which is also known as herpes zoster, is an infection that causes a painful skin rash and fluid-filled blisters. It is caused by a virus. ?Shingles only develops in people who: ?Have had chickenpox. ?Have been vaccinated against chickenpox. Shingles is rare in this group. ?What are the causes? ?Shingles is caused by varicella-zoster virus. This is the same virus that causes chickenpox. After a person is exposed to the virus, it stays in the body in an inactive (dormant) state. Shingles develops if the virus is reactivated. This can happen many years after the first (initial) exposure to the virus. It is not known what causes this virus to be reactivated. ?What increases the risk? ?People who have had chickenpox or received the chickenpox vaccine are at risk for shingles. Shingles infection is more common in people who: ?Are older than 62 years of age. ?Have a weakened disease-fighting system (immune system), such as people with: ?HIV (human immunodeficiency virus). ?AIDS (acquired immunodeficiency syndrome). ?Cancer. ?Are taking medicines that weaken the immune system, such as organ transplant medicines. ?Are experiencing a lot of stress. ?What are the signs or symptoms? ?Early symptoms of this condition include itching, tingling, and pain in an area on your skin. Pain may be described as burning, stabbing, or throbbing. ?A few days or weeks after early symptoms start, a painful red rash appears. The rash is usually on one side of the body and has a band-like or belt-like pattern. The rash eventually turns into fluid-filled blisters that break open, change into scabs, and dry up in about 2-3 weeks. ?At any time during the infection, you may also develop: ?A fever. ?Chills. ?A headache. ?Nausea. ?How is this diagnosed? ?This condition is diagnosed with a skin exam. Skin or fluid samples (a culture) may be taken from the blisters before a diagnosis is made. ?How is this treated? ?The rash may last  for several weeks. There is not a specific cure for this condition. Your health care provider may prescribe medicines to help you manage pain, recover more quickly, and avoid long-term problems. Medicines may include: ?Antiviral medicines. ?Anti-inflammatory medicines. ?Pain medicines. ?Anti-itching medicines (antihistamines). ?If the area involved is on your face, you may be referred to a specialist, such as an eye doctor (ophthalmologist) or an ear, nose, and throat (ENT) doctor (otorhinolaryngologist) to help you avoid eye problems, chronic pain, or disability. ?Follow these instructions at home: ?Medicines ?Take over-the-counter and prescription medicines only as told by your health care provider. ?Apply an anti-itch cream or numbing cream to the affected area as told by your health care provider. ?Relieving itching and discomfort ? ?Apply cold, wet cloths (cold compresses) to the area of the rash or blisters as told by your health care provider. ?Cool baths can be soothing. Try adding baking soda or dry oatmeal to the water to reduce itching. Do not bathe in hot water. ?Use calamine lotion as recommended by your health care provider. This is an over-the-counter lotion that helps to relieve itchiness. ?Blister and rash care ?Keep your rash covered with a loose bandage (dressing). Wear loose-fitting clothing to help ease the pain of material rubbing against the rash. ?Wash your hands with soap and water for at least 20 seconds before and after you change your dressing. If soap and water are not available, use hand sanitizer. ?Change your dressing as told by your health care provider. ?Keep your rash and blisters clean by washing the area with mild soap and cool water as told by your health   care provider. ?Check your rash every day for signs of infection. Check for: ?More redness, swelling, or pain. ?Fluid or blood. ?Warmth. ?Pus or a bad smell. ?Do not scratch your rash or pick at your blisters. To help avoid  scratching: ?Keep your fingernails clean and cut short. ?Wear gloves or mittens while you sleep, if scratching is a problem. ?General instructions ?Rest as told by your health care provider. ?Wash your hands often with soap and water for at least 20 seconds. If soap and water are not available, use hand sanitizer. Doing this lowers your chance of getting a bacterial skin infection. ?Before your blisters change into scabs, your shingles infection can cause chickenpox in people who have never had it or have never been vaccinated against it. To prevent this from happening, avoid contact with other people, especially: ?Babies. ?Pregnant women. ?Children who have eczema. ?Older people who have transplants. ?People who have chronic illnesses, such as cancer or AIDS. ?Keep all follow-up visits. This is important. ?How is this prevented? ?Getting vaccinated is the best way to prevent shingles and protect against shingles complications. If you have not been vaccinated, talk with your health care provider about getting the vaccine. ?Where to find more information ?Centers for Disease Control and Prevention: www.cdc.gov ?Contact a health care provider if: ?Your pain is not relieved with prescribed medicines. ?Your pain does not get better after the rash heals. ?You have any of these signs of infection: ?More redness, swelling, or pain around the rash. ?Fluid or blood coming from the rash. ?Warmth coming from your rash. ?Pus or a bad smell coming from the rash. ?A fever. ?Get help right away if: ?The rash is on your face or nose. ?You have facial pain, pain around your eye area, or loss of feeling on one side of your face. ?You have difficulty seeing. ?You have ear pain or have ringing in your ear. ?You have a loss of taste. ?Your condition gets worse. ?Summary ?Shingles, also known as herpes zoster, is an infection that causes a painful skin rash and fluid-filled blisters. ?This condition is diagnosed with a skin exam. Skin or  fluid samples (a culture) may be taken from the blisters. ?Keep your rash covered with a loose bandage (dressing). Wear loose-fitting clothing to help ease the pain of material rubbing against the rash. ?Before your blisters change into scabs, your shingles infection can cause chickenpox in people who have never had it or have never been vaccinated against it. ?This information is not intended to replace advice given to you by your health care provider. Make sure you discuss any questions you have with your health care provider. ?Document Revised: 10/08/2020 Document Reviewed: 10/08/2020 ?Elsevier Patient Education ? 2022 [employer] ? ?

## 2021-07-31 NOTE — Progress Notes (Signed)
I,Katawbba Wiggins,acting as a Education administrator for [employer], FNP.,have documented all relevant documentation on the behalf of [name], FNP,as directed by  [name], FNP while in the presence of [name], [place].   This visit occurred during the SARS-CoV-2 public health emergency.  Safety protocols were in place, including screening questions prior to the visit, additional usage of staff PPE, and extensive cleaning of exam room while observing appropriate contact time as indicated for disinfecting solutions.  Subjective:     Patient ID: Margaret Webster , female    DOB: [birth year] , 60 y.o.   MRN: [id number]   Chief Complaint  Patient presents with   Follow-up    Bronchitis    Herpes Zoster     HPI  The patient is here to day for an emergency room follow-up for acute bronchitis. She continues to have right ear pain and throat pain. She has white spots on her throat. She was treated with steroids and   The patient states she has shingles now to her back. She is having pain with burning and itching/soreness. She has not had a shingles vaccine     Past Medical History:  Diagnosis Date   Arthritis    bil shoulders   Bronchitis    Chronic back pain    Chronic kidney disease    Diabetes mellitus    stopped Mrtformin 1 mo ago, made her "sick"   Hypertension    Hypothyroidism    Stroke [facility])    Thyroid disease    Trigger thumb of right hand      Family History  Problem Relation Age of Onset   Diabetes Mother    Cystic fibrosis Sister    Cancer Sister        ?   Heart disease Maternal Grandmother    Clotting disorder Maternal Grandfather      Current Outpatient Medications:    amLODipine (NORVASC) 2.5 MG tablet, Take 1 tablet (2.5 mg total) by mouth daily., Disp: 30 tablet, Rfl: 2   aspirin EC 81 MG tablet, Take 81 mg by mouth once., Disp: , Rfl:    Benzocaine-Menthol (CEPACOL INSTAMAX) 15-20 MG LOZG, Use as directed 1 lozenge in the mouth or throat 3 (three) times  daily as needed., Disp: 16 lozenge, Rfl: 0   benzonatate (TESSALON) 100 MG capsule, Take 1 capsule (100 mg total) by mouth 3 (three) times daily as needed for cough., Disp: 21 capsule, Rfl: 0   fluticasone (FLONASE) 50 MCG/ACT nasal spray, Place 2 sprays into both nostrils daily., Disp: 16 g, Rfl: 0   losartan (COZAAR) 25 MG tablet, Take 12.5 mg by mouth daily. With potassium, Disp: , Rfl:    metFORMIN (GLUCOPHAGE-XR) 500 MG 24 hr tablet, Take 1 tablet by mouth once daily with breakfast, Disp: 90 tablet, Rfl: 0   NEXLETOL 180 MG TABS, TAKE 1 TABLET BY MOUTH AT BEDTIME, Disp: 90 tablet, Rfl: 0   nystatin cream (MYCOSTATIN), Apply 1 application topically 2 (two) times daily., Disp: 30 g, Rfl: 0   nystatin-triamcinolone ointment (MYCOLOG), Apply 1 application topically 2 (two) times daily., Disp: 30 g, Rfl: 0   triamcinolone cream (KENALOG) 0.5 %, Apply 1 application topically 2 (two) times daily., Disp: 30 g, Rfl: 0   UNABLE TO FIND, Hair,skin and nails : one tablet daily, Disp: , Rfl:    gabapentin (NEURONTIN) 100 MG capsule, Take 1 capsule (100 mg total) by mouth 3 (three) times daily as needed., Disp: 30 capsule, Rfl: 2  HYDROcodone-acetaminophen (NORCO) 5-325 MG tablet, 1-2 tabs po q6 hours prn pain (Patient not taking: Reported on 07/31/2021), Disp: 20 tablet, Rfl: 0   Allergies  Allergen Reactions   Sulfa Antibiotics Other (See Comments)    Hard to breathe   Crestor [Rosuvastatin] Other (See Comments)    Makes her mouth numb and causes side pain   Ibuprofen Other (See Comments)    Stomach upset   Polytrim [Polymyxin B-Trimethoprim] Itching and Swelling    redness   Influenza Vaccines Hives   Lipitor [Atorvastatin Calcium] Other (See Comments)    Muscle ache    Naproxen Nausea And Vomiting     Review of Systems  Constitutional: Negative.   HENT:  Positive for congestion, ear pain and sore throat (white spots on throat).   Respiratory:  Positive for cough.   Cardiovascular:  Negative.   Gastrointestinal: Negative.   Neurological:  Negative for dizziness and headaches.  Psychiatric/Behavioral: Negative.    All other systems reviewed and are negative.   Today's Vitals   07/31/21 0859  BP: 132/80  Pulse: 64  Temp: 98.2 F (36.8 C)  SpO2: 99%  Weight: 226 lb 9.6 oz (102.8 kg)  Height: 5\' 7"  (1.702 m)  PainSc: 5   PainLoc: Back   Body mass index is 35.49 kg/m.  Wt Readings from Last 3 Encounters:  07/31/21 226 lb 9.6 oz (102.8 kg)  07/23/21 231 lb 7.7 oz (105 kg)  06/26/21 224 lb 6.4 oz (101.8 kg)    BP Readings from Last 3 Encounters:  07/31/21 132/80  07/23/21 (!) 129/57  07/14/21 (!) 157/62    Objective:  Physical Exam Vitals reviewed.  Constitutional:      General: She is not in acute distress.    Appearance: Normal appearance. She is obese.  HENT:     Right Ear: Tympanic membrane, ear canal and external ear normal. There is no impacted cerumen.     Left Ear: Tympanic membrane, ear canal and external ear normal. There is no impacted cerumen.     Mouth/Throat:     Comments: White exudate noted to right posterior oropharynx Cardiovascular:     Rate and Rhythm: Normal rate and regular rhythm.     Pulses: Normal pulses.     Heart sounds: Normal heart sounds. No murmur heard. Pulmonary:     Effort: Pulmonary effort is normal. No respiratory distress.     Breath sounds: Normal breath sounds. No wheezing.  Musculoskeletal:     Cervical back: Tenderness (right anterior neck) present.  Skin:    Comments: Dry, scaly rash to lower mid back   Neurological:     General: No focal deficit present.     Mental Status: She is alert and oriented to person, place, and time.     Cranial Nerves: No cranial nerve deficit.     Motor: No weakness.  Psychiatric:        Mood and Affect: Mood normal.        Behavior: Behavior normal.        Thought Content: Thought content normal.        Judgment: Judgment normal.        Assessment And Plan:     1.  Rash and nonspecific skin eruption Comments: Continus to have rash to lower back dried, likely shingles will provide pain medication has already been treated with antiviral - gabapentin (NEURONTIN) 100 MG capsule; Take 1 capsule (100 mg total) by mouth 3 (three) times daily as needed.  Dispense: 30 capsule; Refill: 2  2. Acute bronchitis, unspecified organism Comments: She has had a productive cough and treate with a course of steroids. Will treat with rocephin as well. Had white spots to oropharynx, negative rapid strep - cefTRIAXone (ROCEPHIN) injection 1 g  3. Sore throat Comments: Negative rapid strep Warm salt water gargles - cefTRIAXone (ROCEPHIN) injection 1 g - POCT rapid strep A    Patient was given opportunity to ask questions. Patient verbalized understanding of the plan and was able to repeat key elements of the plan. All questions were answered to their satisfaction.  [name], FNP   I, [name], FNP, have reviewed all documentation for this visit. The documentation on 08/09/21 for the exam, diagnosis, procedures, and orders are all accurate and complete.   IF YOU HAVE BEEN REFERRED TO A SPECIALIST, IT MAY TAKE 1-2 WEEKS TO SCHEDULE/PROCESS THE REFERRAL. IF YOU HAVE NOT HEARD FROM US/SPECIALIST IN TWO WEEKS, PLEASE GIVE [place] A CALL AT [phone].   THE PATIENT IS ENCOURAGED TO PRACTICE SOCIAL DISTANCING DUE TO THE COVID-19 PANDEMIC.

## 2021-08-06 ENCOUNTER — Telehealth: Payer: Medicare Other

## 2021-08-16 ENCOUNTER — Telehealth: Payer: Medicare Other

## 2021-08-16 ENCOUNTER — Ambulatory Visit (INDEPENDENT_AMBULATORY_CARE_PROVIDER_SITE_OTHER): Payer: Medicare Other

## 2021-08-16 DIAGNOSIS — N1831 Chronic kidney disease, stage 3a: Secondary | ICD-10-CM

## 2021-08-16 DIAGNOSIS — I10 Essential (primary) hypertension: Secondary | ICD-10-CM

## 2021-08-16 DIAGNOSIS — R7303 Prediabetes: Secondary | ICD-10-CM

## 2021-08-16 DIAGNOSIS — E782 Mixed hyperlipidemia: Secondary | ICD-10-CM

## 2021-08-16 NOTE — Patient Instructions (Signed)
Visit Information  PATIENT GOALS:  Goals Addressed      Mixed Hyperlipidemia - disease progression minimized or prevented       Timeframe:  Long-Range Goal Priority:  High Start Date:  02/04/21                           Expected End Date: 02/04/22     Next Follow Up date:  08/27/21         Self Care Activities:  Continue to keep all scheduled follow up appointments Take medications as directed  Let your healthcare team know if you are unable to take your medications Call your pharmacy for refills at least 7 days prior to running out of medication Increase your water intake unless otherwise directed Review patient educational materials related to Hyperlipidemia  Patient Goals: - to lower Cholesterol by eating better and exercising              Monitor and Manage My Blood Sugar-Diabetes Type 2   On track    Timeframe:  Long-Range Goal Priority:  Medium Start Date: 02/04/21                            Expected End Date: 02/04/22                      Follow Up Date: 08/27/21   - check blood sugar at prescribed times - check blood sugar if I feel it is too high or too low - enter blood sugar readings and medication or insulin into daily log - take the blood sugar log to all doctor visits - take the blood sugar meter to all doctor visits    Why is this important?   Checking your blood sugar at home helps to keep it from getting very high or very low.  Writing the results in a diary or log helps the doctor know how to care for you.  Your blood sugar log should have the time, date and the results.  Also, write down the amount of insulin or other medicine that you take.  Other information, like what you ate, exercise done and how you were feeling, will also be helpful.     Notes:      Muscle Cramps evaluated and treated   On track    Timeframe:  Short-Term Goal Priority:  High Start Date:  08/16/21                           Expected End Date:  10/16/21    Follow up date: 08/27/21       Patient Goals: - continue to drink 64 oz of water daily - keep appointment with PCP for 08/19/21 to have severe muscle cramps evaluated and treated                    Osteoarthritis - Maintain Mobility and Function   On track    Timeframe:  Long-Range Goal Priority:  High Start Date:  02/04/21                          Expected End Date:  02/04/22      Next Scheduled Follow Up date: 08/27/21     Patient Goals: - ask PCP for new Ortho referral - revisit PT to learn  safe and effective stretches/exercise                    The patient verbalized understanding of instructions, educational materials, and care plan provided today and declined offer to receive copy of patient instructions, educational materials, and care plan.   Telephone follow up appointment with care management team member scheduled for: 08/27/21  Barb Merino, RN, BSN, CCM Care Management Coordinator [place] [facility]  Direct Phone: [phone]

## 2021-08-16 NOTE — Chronic Care Management (AMB) (Signed)
Chronic Care Management   CCM RN Visit Note  08/16/2021 Name: Margaret Webster MRN: [id number] DOB: 1961/04/21  Subjective: Margaret Webster is a 60 y.o. year old female who is a primary care patient of Glendale Chard, MD. The care management team was consulted for assistance with disease management and care coordination needs.    Engaged with patient by telephone for follow up visit in response to provider referral for case management and/or care coordination services.   Consent to Services:  The patient was given information about Chronic Care Management services, agreed to services, and gave verbal consent prior to initiation of services.  Please see initial visit note for detailed documentation.   Patient agreed to services and verbal consent obtained.   Assessment: Review of patient past medical history, allergies, medications, health status, including review of consultants reports, laboratory and other test data, was performed as part of comprehensive evaluation and provision of chronic care management services.   SDOH (Social Determinants of Health) assessments and interventions performed:    CCM Care Plan  Allergies  Allergen Reactions   Sulfa Antibiotics Other (See Comments)    Hard to breathe   Crestor [Rosuvastatin] Other (See Comments)    Makes her mouth numb and causes side pain   Ibuprofen Other (See Comments)    Stomach upset   Polytrim [Polymyxin B-Trimethoprim] Itching and Swelling    redness   Influenza Vaccines Hives   Lipitor [Atorvastatin Calcium] Other (See Comments)    Muscle ache    Naproxen Nausea And Vomiting    Outpatient Encounter Medications as of 08/16/2021  Medication Sig   amLODipine (NORVASC) 2.5 MG tablet Take 1 tablet (2.5 mg total) by mouth daily.   aspirin EC 81 MG tablet Take 81 mg by mouth once.   Benzocaine-Menthol (CEPACOL INSTAMAX) 15-20 MG LOZG Use as directed 1 lozenge in the mouth or throat 3 (three) times daily as needed.    benzonatate (TESSALON) 100 MG capsule Take 1 capsule (100 mg total) by mouth 3 (three) times daily as needed for cough.   fluticasone (FLONASE) 50 MCG/ACT nasal spray Place 2 sprays into both nostrils daily.   gabapentin (NEURONTIN) 100 MG capsule Take 1 capsule (100 mg total) by mouth 3 (three) times daily as needed.   HYDROcodone-acetaminophen (NORCO) 5-325 MG tablet 1-2 tabs po q6 hours prn pain (Patient not taking: Reported on 07/31/2021)   losartan (COZAAR) 25 MG tablet Take 12.5 mg by mouth daily. With potassium   metFORMIN (GLUCOPHAGE-XR) 500 MG 24 hr tablet Take 1 tablet by mouth once daily with breakfast   NEXLETOL 180 MG TABS TAKE 1 TABLET BY MOUTH AT BEDTIME   nystatin cream (MYCOSTATIN) Apply 1 application topically 2 (two) times daily.   nystatin-triamcinolone ointment (MYCOLOG) Apply 1 application topically 2 (two) times daily.   triamcinolone cream (KENALOG) 0.5 % Apply 1 application topically 2 (two) times daily.   UNABLE TO FIND Hair,skin and nails : one tablet daily   No facility-administered encounter medications on file as of 08/16/2021.    Patient Active Problem List   Diagnosis Date Noted   Depression 07/05/2020   Ringing in ears 07/05/2020   CVA (cerebral vascular accident) ([place]) 02/26/2020   CKD (chronic kidney disease) stage 3, GFR 30-59 ml/min (HCC)    Bradycardia    Elevated TSH    Multiple joint pain 11/07/2019   Acute pain of right shoulder 09/19/2019   Right hip pain 09/19/2019   Prediabetes 09/08/2018   Mass of  joint of finger 07/26/2015   Chest pain 01/10/2015   DM (diabetes mellitus) ([place]) 01/10/2015   Hypertension 01/10/2015   Hypothyroidism 01/10/2015   Headache 01/10/2015   Abdominal pain 01/10/2015   Vaginitis 04/07/2014   Pain 04/07/2014   Vaginal irritation 04/07/2014   Boil of buttock 04/06/2014   BV (bacterial vaginosis) 04/06/2014   Unspecified symptom associated with female genital organs 11/16/2013   Candidiasis of vulva and vagina  11/16/2013   Vaginitis and vulvovaginitis, unspecified 06/07/2013   Pain aggravated by activities of daily living 06/07/2013   S/P cervical spinal fusion 01/17/2013   Cervical radiculopathy 12/13/2012   Carpal tunnel syndrome on both sides 12/06/2012   Cervical spondylosis with myelopathy 12/06/2012   Nonspecific abnormal finding in stool contents 12/02/2011   Diabetes mellitus without mention of complication 28/63/8177   Chronic back pain     Conditions to be addressed/monitored: Essential hypertension, Stage 3a chronic kidney disease, Mixed hyperlipidemia, Prediabetes  Care Plan : Diabetes Type 2 (Adult)  Updates made by Lynne Logan, RN since 08/16/2021 12:00 AM     Problem: Glycemic Management (Diabetes, Type 2)   Priority: Medium     Long-Range Goal: Glycemic Management Optimized   Start Date: 02/04/2021  Expected End Date: 02/04/2022  Recent Progress: On track  Priority: Medium  Note:   Objective:  Lab Results  Component Value Date   HGBA1C 6.0 (H) 03/12/2021   Lab Results  Component Value Date   CREATININE 1.22 (H) 03/12/2021   CREATININE 1.19 (H) 10/25/2020   CREATININE 1.06 (H) 06/06/2020   Lab Results  Component Value Date   EGFR 50 (L) 03/12/2021   Current Barriers:  Knowledge Deficits related to basic Diabetes pathophysiology and self care/management Knowledge Deficits related to medications used for management of diabetes Case Manager Clinical Goal(s):  patient will demonstrate improved adherence to prescribed treatment plan for diabetes self care/management as evidenced by: daily monitoring and recording of CBG  adherence to ADA/ carb modified diet exercise 5 days/week adherence to prescribed medication regimen contacting provider for new or worsened symptoms or questions Interventions:  08/16/21 completed successful outbound call with patient  Collaboration with [name], [place] regarding development and update of comprehensive plan of care as  evidenced by provider attestation and co-signature Inter-disciplinary care team collaboration (see longitudinal plan of care) Review of patient status, including review of consultants reports, relevant laboratory and other test results, and medications completed. Reviewed medications with patient and discussed importance of medication adherence Determined patient has taken several rounds of prednisone since developing Bronchitis mid September Educated patient on potential SE including hyperglycemia while taking steroids, advised blood sugar monitoring is important and may need to be completed more frequently during this time  Re-educated patient on daily glycemic control, FBS 80-130, <180 after meals; 15'15' rule  Advised patient, providing education and rationale, to check cbg daily before meals and at bedtime and record, calling the PCP and or CM RN for findings outside established parameters.   Discussed plans with patient for ongoing care management follow up and provided patient with direct contact information for care management team Self-Care Activities Self administers oral medications as prescribed Attends all scheduled provider appointments Checks blood sugars as prescribed and utilize hyper and hypoglycemia protocol as needed Adheres to prescribed ADA/carb modified Patient Goals: - check blood sugar at prescribed times - check blood sugar if I feel it is too high or too low - enter blood sugar readings and medication or insulin into daily log -  take the blood sugar log to all doctor visits - take the blood sugar meter to all doctor visits - keep appointment with eye doctor - schedule appointment with eye doctor - check feet daily for cuts, sores or redness - keep feet up while sitting - trim toenails straight across - wash and dry feet carefully every day - wear comfortable, cotton socks - wear comfortable, well-fitting shoes  Follow Up Plan: Telephone follow up appointment  with care management team member scheduled for: 08/27/21      Care Plan : Osteoarthritis (Adult)  Updates made by Lynne Logan, RN since 08/16/2021 12:00 AM     Problem: Mobility and Function (Osteoarthritis)   Priority: High     Long-Range Goal: Maintain Mobility and Function   Start Date: 02/04/2021  Expected End Date: 02/04/2022  Recent Progress: On track  Priority: High  Note:   Current Barriers:  Ineffective Self Health Maintenance  Clinical Goal(s):  Collaboration with [name], FNP regarding development and update of comprehensive plan of care as evidenced by provider attestation and co-signature Inter-disciplinary care team collaboration (see longitudinal plan of care) patient will work with care management team to address care coordination and chronic disease management needs related to Disease Management Educational Needs Care Coordination Medication Management and Education Psychosocial Support   Interventions:  08/16/21 completed successful outbound call with patient  Evaluation of current treatment plan related to  Osteoarthritis , self-management and patient's adherence to plan as established by provider. Collaboration with [name], FNP regarding development and update of comprehensive plan of care as evidenced by provider attestation       and co-signature Inter-disciplinary care team collaboration (see longitudinal plan of care) Determined patient is experiencing increased pain, especially to her right side Discussed patient is not following a HEP, she started PT about 4 months ago but did not finish due to feeling she being over worked and having worsening symptoms Educated patient on the benefits of participating in PT and developing a home exercise routine for safe and effective stretching and exercise Determined patient would like to revisit PT and plans to ask [facility] for a new Orthopedic referral during next office visit Assessed for adequate  hydration, patient is drinking 3-4 bottles of water daily  Discussed plans with patient for ongoing care management follow up and provided patient with direct contact information for care management team Self Care Activities:  Continue to keep all scheduled follow up appointments Take medications as directed  Let your healthcare team know if you are unable to take your medications Call your pharmacy for refills at least 7 days prior to running out of medication Increase your water intake unless otherwise directed Patient Goals: - ask PCP for new Ortho referral - revisit PT to learn safe and effective stretches/exercise   Follow Up Plan: Telephone follow up appointment with care management team member scheduled for: 08/27/21    Care Plan : Wellness (Adult)  Updates made by Lynne Logan, RN since 08/16/2021 12:00 AM     Problem: Chronic disease educatoin and Care Coordination needs for Essential hypertension, Stage 3a chronic kidney disease, Mixed hyperlipidemia, Prediabetes   Priority: High     Goal: Muscle Cramps evaluated and treated   Start Date: 08/16/2021  Expected End Date: 10/16/2021  This Visit's Progress: On track  Priority: High  Note:   Current Barriers:  Ineffective Self Health Maintenance in a patient with Essential hypertension, Stage 3a chronic kidney disease, Mixed hyperlipidemia, Prediabetes Clinical Goal(s):  Collaboration with Glendale Chard, MD regarding development and update of comprehensive plan of care as evidenced by provider attestation and co-signature Inter-disciplinary care team collaboration (see longitudinal plan of care) patient will work with care management team to address care coordination and chronic disease management needs related to Disease Management Educational Needs Care Coordination Medication Management and Education Medication Reconciliation Psychosocial Support   Interventions:  Evaluation of current treatment plan related to   Muscle Cramps , self-management and patient's adherence to plan as established by provider. Collaboration with Glendale Chard, MD regarding development and update of comprehensive plan of care as evidenced by provider attestation       and co-signature Inter-disciplinary care team collaboration (see longitudinal plan of care) Determined patient has been experiencing severe muscle cramps for several weeks Assessed for potential causes for symptoms, determined patient has history of vitamin D deficiency, she is currently not taking a Vitamin D supplement, she was also taking Calcium but is no longer taking and is unsure if she should resume  Assessed for adequate hydration, patient is drinking 3-4 16 oz bottles daily Determined patient has recently completed a 3rd dose pack of Prednisone since mid September Added patient to PCP scheduled for [name] FNP for Monday, 08/19/21 $[id number]$ :15 AM per patient's request to have muscle cramps evaluated and treated Sent in basket message to PCP to advise of patient's reported symptoms and request to be evaluated, notified of appointment for 08/19/21 $[id number]'@[id number]$ :15 AM  Discussed plans with patient for ongoing care management follow up and provided patient with direct contact information for care management team Self Care Activities:  Self administers medications as prescribed Attends all scheduled provider appointments Calls pharmacy for medication refills Calls provider office for new concerns or questions Patient Goals: - continue to drink 64 oz of water daily - keep appointment with PCP for 08/19/21 to have severe muscle cramps evaluated and treated   Follow Up Plan: Telephone follow up appointment with care management team member scheduled for: 08/27/21     Plan:Telephone follow up appointment with care management team member scheduled for:  08/27/21  Barb Merino, RN, BSN, CCM Care Management Coordinator [place] [facility]   Direct Phone: [phone]

## 2021-08-19 ENCOUNTER — Ambulatory Visit: Payer: Medicare Other | Admitting: Nurse Practitioner

## 2021-08-22 ENCOUNTER — Other Ambulatory Visit: Payer: Self-pay | Admitting: Nurse Practitioner

## 2021-08-26 DIAGNOSIS — N1831 Chronic kidney disease, stage 3a: Secondary | ICD-10-CM

## 2021-08-26 DIAGNOSIS — E782 Mixed hyperlipidemia: Secondary | ICD-10-CM

## 2021-08-26 DIAGNOSIS — I10 Essential (primary) hypertension: Secondary | ICD-10-CM | POA: Diagnosis not present

## 2021-08-27 ENCOUNTER — Telehealth: Payer: Medicare Other

## 2021-08-27 ENCOUNTER — Telehealth: Payer: Self-pay

## 2021-08-27 NOTE — Telephone Encounter (Signed)
  Care Management   Follow Up Note   08/27/2021 Name: Margaret Webster MRN: [id number] DOB: 12/18/1960   Referred by: Glendale Chard, MD Reason for referral : Chronic Care Management (RN CM Follow up call )   An unsuccessful telephone outreach was attempted today. The patient was referred to the case management team for assistance with care management and care coordination.   Follow Up Plan: A HIPPA compliant phone message was left for the patient providing contact information and requesting a return call.   Barb Merino, RN, BSN, CCM Care Management Coordinator [place] [facility]  Direct Phone: [phone]

## 2021-10-17 ENCOUNTER — Encounter: Payer: Medicare Other | Admitting: Nurse Practitioner

## 2021-10-19 ENCOUNTER — Other Ambulatory Visit: Payer: Self-pay | Admitting: Nurse Practitioner

## 2021-10-22 ENCOUNTER — Other Ambulatory Visit: Payer: Self-pay

## 2021-10-22 MED ORDER — AMLODIPINE BESYLATE 2.5 MG PO TABS: 2.5000 mg | ORAL_TABLET | Freq: Every day | ORAL | 0 refills | Status: DC

## 2021-10-31 ENCOUNTER — Telehealth: Payer: Medicare Other

## 2021-11-01 ENCOUNTER — Ambulatory Visit
Admission: EM | Admit: 2021-11-01 | Discharge: 2021-11-01 | Disposition: A | Discharge status: Home / Self Care | Payer: Medicare Other

## 2021-11-01 ENCOUNTER — Other Ambulatory Visit: Payer: Self-pay

## 2021-11-01 DIAGNOSIS — U071 COVID-19: Secondary | ICD-10-CM | POA: Diagnosis not present

## 2021-11-01 NOTE — ED Provider Notes (Signed)
[facility]    CSN: [id number] Arrival date & time: 11/01/21  0932      History   Chief Complaint Chief Complaint  Patient presents with   Nasal Congestion   Cough    HPI Margaret Webster is a 61 y.o. female.   Patient here today for evaluation of two day history of cough, chills, runny nose, sore throat, and body aches. She has been taking OTC meds without significant relief. She reports yesterday she had a positive at home Covid test.  The history is provided by the patient.  Cough Associated symptoms: chills, fever and myalgias   Associated symptoms: no sore throat    Past Medical History:  Diagnosis Date   Arthritis    bil shoulders   Bronchitis    Chronic back pain    Chronic kidney disease    Diabetes mellitus    stopped Mrtformin 1 mo ago, made her "sick"   Hypertension    Hypothyroidism    Stroke [facility])    Thyroid disease    Trigger thumb of right hand     Patient Active Problem List   Diagnosis Date Noted   Depression 07/05/2020   Ringing in ears 07/05/2020   CVA (cerebral vascular accident) ([place]) 02/26/2020   CKD (chronic kidney disease) stage 3, GFR 30-59 ml/min (HCC)    Bradycardia    Elevated TSH    Multiple joint pain 11/07/2019   Acute pain of right shoulder 09/19/2019   Right hip pain 09/19/2019   Prediabetes 09/08/2018   Mass of joint of finger 07/26/2015   Chest pain 01/10/2015   DM (diabetes mellitus) ([place]) 01/10/2015   Hypertension 01/10/2015   Hypothyroidism 01/10/2015   Headache 01/10/2015   Abdominal pain 01/10/2015   Vaginitis 04/07/2014   Pain 04/07/2014   Vaginal irritation 04/07/2014   Boil of buttock 04/06/2014   BV (bacterial vaginosis) 04/06/2014   Unspecified symptom associated with female genital organs 11/16/2013   Candidiasis of vulva and vagina 11/16/2013   Vaginitis and vulvovaginitis, unspecified 06/07/2013   Pain aggravated by activities of daily living 06/07/2013   S/P cervical spinal fusion  01/17/2013   Cervical radiculopathy 12/13/2012   Carpal tunnel syndrome on both sides 12/06/2012   Cervical spondylosis with myelopathy 12/06/2012   Nonspecific abnormal finding in stool contents 12/02/2011   Diabetes mellitus without mention of complication 74/16/3845   Chronic back pain     Past Surgical History:  Procedure Laterality Date   [place]  08/2015   CARPAL TUNNEL RELEASE Left 09/10/2017   Procedure: CARPAL TUNNEL RELEASE;  Surgeon: Leanora Cover, MD;  Location: [facility];  Service: Orthopedics;  Laterality: Left;   CERVICAL SPINE SURGERY Right 11/2012   OOPHORECTOMY     TRIGGER FINGER RELEASE Right 08/06/2017   Procedure: RIGHT THUMB TRIGGER RELEASE;  Surgeon: Leanora Cover, MD;  Location: [place];  Service: Orthopedics;  Laterality: Right;   ULNAR COLLATERAL LIGAMENT REPAIR Right 03/21/2021   Procedure: RIGHT THUMB ULNAR COLLATERAL LIGAMENT REPAIR;  Surgeon: Leanora Cover, MD;  Location: [place];  Service: Orthopedics;  Laterality: Right;    OB History     Gravida  1   Para  0   Term  0   Preterm  0   AB  0   Living  1      SAB  0   IAB  0   Ectopic  0  Multiple  0   Live Births  1            Home Medications    Prior to Admission medications   Medication Sig Start Date End Date Taking? Authorizing Provider  amLODipine (NORVASC) 2.5 MG tablet Take 1 tablet (2.5 mg total) by mouth daily. 10/22/21 01/20/22  [name], FNP  aspirin EC 81 MG tablet Take 81 mg by mouth once.    [provider]  Benzocaine-Menthol (CEPACOL INSTAMAX) 15-20 MG LOZG Use as directed 1 lozenge in the mouth or throat 3 (three) times daily as needed. 07/14/21   Noe Gens, PA-C  benzonatate (TESSALON) 100 MG capsule Take 1 capsule (100 mg total) by mouth 3 (three) times daily as needed for cough. 07/23/21   Sherwood Gambler, MD  fluticasone (FLONASE) 50 MCG/ACT nasal spray  Place 2 sprays into both nostrils daily. 07/14/21   Noe Gens, PA-C  gabapentin (NEURONTIN) 100 MG capsule Take 1 capsule (100 mg total) by mouth 3 (three) times daily as needed. 07/31/21 07/31/22  [name], FNP  HYDROcodone-acetaminophen [facility]) 5-325 MG tablet 1-2 tabs po q6 hours prn pain Patient not taking: Reported on 07/31/2021 03/21/21   Leanora Cover, MD  losartan (COZAAR) 25 MG tablet Take 12.5 mg by mouth daily. With potassium 03/08/21   [provider]  metFORMIN (GLUCOPHAGE-XR) 500 MG 24 hr tablet Take 1 tablet by mouth once daily with breakfast 07/09/21   [name], FNP  NEXLETOL 180 MG TABS TAKE 1 TABLET BY MOUTH AT BEDTIME 07/09/21   [name], FNP  nystatin cream (MYCOSTATIN) Apply 1 application topically 2 (two) times daily. 11/01/20   [name], FNP  nystatin-triamcinolone ointment [facility]) Apply 1 application topically 2 (two) times daily. 10/25/20   [name], FNP  triamcinolone cream (KENALOG) 0.5 % Apply 1 application topically 2 (two) times daily. 11/01/20   [name], FNP  UNABLE TO FIND Hair,skin and nails : one tablet daily    [provider]    Family History Family History  Problem Relation Age of Onset   Diabetes Mother    Cystic fibrosis Sister    Cancer Sister        ?   Heart disease Maternal Grandmother    Clotting disorder Maternal Grandfather     Social History Social History   Tobacco Use   Smoking status: Every Day    Packs/day: 0.25    Types: Cigarettes   Smokeless tobacco: Never   Tobacco comments:    4 cig a day, not interested in chantix  Vaping Use   Vaping Use: Never used  Substance Use Topics   Alcohol use: Yes    Comment: occas   Drug use: Yes    Types: Marijuana     Allergies   Sulfa antibiotics, Crestor [rosuvastatin], Ibuprofen, Polytrim [polymyxin b-trimethoprim], Influenza vaccines, Lipitor [atorvastatin calcium], and Naproxen   Review of Systems Review of Systems  Constitutional:   Positive for chills and fever.  HENT:  Positive for congestion. Negative for sore throat.   Respiratory:  Positive for cough.   Musculoskeletal:  Positive for myalgias.    Physical Exam Triage Vital Signs ED Triage Vitals  Enc Vitals Group     BP 11/01/21 1047 121/81     Pulse Rate 11/01/21 1047 69     Resp 11/01/21 1047 18     Temp 11/01/21 1047 98.2 F (36.8 C)     Temp Source 11/01/21 1047 Oral  SpO2 11/01/21 1047 95 %     Weight --      Height --      Head Circumference --      Peak Flow --      Pain Score 11/01/21 1049 0     Pain Loc --      Pain Edu? --      Excl. in [place]? --    No data found.  Updated Vital Signs BP 121/81 (BP Location: Left Arm)    Pulse 69    Temp 98.2 F (36.8 C) (Oral)    Resp 18    SpO2 95%      Physical Exam Vitals and nursing note reviewed.  Constitutional:      General: She is not in acute distress.    Appearance: Normal appearance. She is not ill-appearing.  HENT:     Head: Normocephalic and atraumatic.     Nose: Congestion present.     Mouth/Throat:     Mouth: Mucous membranes are moist.     Pharynx: No oropharyngeal exudate or posterior oropharyngeal erythema.  Eyes:     Conjunctiva/sclera: Conjunctivae normal.  Cardiovascular:     Rate and Rhythm: Normal rate and regular rhythm.     Heart sounds: Normal heart sounds. No murmur heard. Pulmonary:     Effort: Pulmonary effort is normal. No respiratory distress.     Breath sounds: Normal breath sounds. No wheezing, rhonchi or rales.  Skin:    General: Skin is warm and dry.  Neurological:     Mental Status: She is alert.  Psychiatric:        Mood and Affect: Mood normal.        Thought Content: Thought content normal.     UC Treatments / Results  Labs (all labs ordered are listed, but only abnormal results are displayed) Labs Reviewed - No data to display  EKG   Radiology No results found.  Procedures Procedures (including critical care time)  Medications  Ordered in UC Medications - No data to display  Initial Impression / Assessment and Plan / UC Course  I have reviewed the triage vital signs and the nursing notes.  Pertinent labs & imaging results that were available during my care of the patient were reviewed by me and considered in my medical decision making (see chart for details).   Recommended symptomatic treatment, increased fluids and rest. Encouraged patient to notify her PCP that she is covid positive. Recommend ED with any worsening symptoms or concerns.  Final Clinical Impressions(s) / UC Diagnoses   Final diagnoses:  [id number]   Discharge Instructions   None    ED Prescriptions   None    PDMP not reviewed this encounter.   Francene Finders, PA-C 11/01/21 1505

## 2021-11-01 NOTE — ED Triage Notes (Signed)
Two day h/o cough, chills, runny eyes, sore throat, body aches and runny nose. Has been taking formula 44, tylenol and theraflu. Positive at home covid test.

## 2021-11-04 ENCOUNTER — Emergency Department (HOSPITAL_COMMUNITY): Payer: Medicare Other

## 2021-11-04 ENCOUNTER — Emergency Department (HOSPITAL_COMMUNITY)
Admission: EM | Admit: 2021-11-04 | Discharge: 2021-11-05 | Discharge status: Against Medical Advice | Payer: Medicare Other | Attending: Emergency Medicine | Admitting: Emergency Medicine

## 2021-11-04 ENCOUNTER — Encounter (HOSPITAL_COMMUNITY): Payer: Self-pay | Admitting: Emergency Medicine

## 2021-11-04 DIAGNOSIS — I629 Nontraumatic intracranial hemorrhage, unspecified: Secondary | ICD-10-CM | POA: Diagnosis not present

## 2021-11-04 DIAGNOSIS — R531 Weakness: Secondary | ICD-10-CM | POA: Diagnosis not present

## 2021-11-04 DIAGNOSIS — Z9889 Other specified postprocedural states: Secondary | ICD-10-CM | POA: Diagnosis not present

## 2021-11-04 DIAGNOSIS — R202 Paresthesia of skin: Secondary | ICD-10-CM | POA: Diagnosis not present

## 2021-11-04 DIAGNOSIS — I6782 Cerebral ischemia: Secondary | ICD-10-CM | POA: Diagnosis not present

## 2021-11-04 DIAGNOSIS — R200 Anesthesia of skin: Secondary | ICD-10-CM | POA: Insufficient documentation

## 2021-11-04 DIAGNOSIS — Z5321 Procedure and treatment not carried out due to patient leaving prior to being seen by health care provider: Secondary | ICD-10-CM | POA: Insufficient documentation

## 2021-11-04 NOTE — ED Triage Notes (Signed)
Patient here with complaint of right sided numbness that was present on waking this morning and noticed new weakness in right and numbness on entire right side of body that patient reports is spreading to left side of body. LKW at 2300 when she went to bed the night before. No facial droop, no arm drift, no ataxia in upper extremities, no dysarthria, no vision changes. Patient alert, oriented, and in no apparent distress at this time.

## 2021-11-04 NOTE — ED Notes (Signed)
Patient stepping outside

## 2021-11-04 NOTE — ED Provider Triage Note (Signed)
Emergency Medicine Provider Triage Evaluation Note  Margaret Webster , a 61 y.o. female  was evaluated in triage.  Pt complains of numbness and weakness to right lower extremity and numbness to right side of face.  Reports that she woke up this morning at 8 AM and symptoms were present.  Patient states that she went to bed at 11 PM last night without any numbness or weakness.  Patient reports that numbness and weakness have persisted.  Patient states that she is intermittently having numbness to left lower extremity.  Denies any recent falls or injuries.  Review of Systems  Positive: Numbness, weakness Negative: Visual disturbance, facial asymmetry, dysarthria  Physical Exam  BP (!) 161/81 (BP Location: Left Arm)    Pulse (!) 59    Temp 98.2 F (36.8 C) (Oral)    Resp 16    SpO2 100%  Gen:   Awake, no distress   Resp:  Normal effort  MSK:   Moves extremities without difficulty  Other:  Negative pronator drift.  No facial asymmetry.  Patient reports decree sensation to light touch on the right side of her face.  Sensation grossly intact to bilateral upper and lower extremities.  + Strength to bilateral upper extremities.  +4 strength to bilateral lower extremities  Medical Decision Making  Medically screening exam initiated at 5:58 PM.  Appropriate orders placed.  JARYAH ARACENA was informed that the remainder of the evaluation will be completed by another provider, this initial triage assessment does not replace that evaluation, and the importance of remaining in the ED until their evaluation is complete.  Patient has had stroke window at this time.  Will order noncontrast head CT as well as MRI brain without contrast.   Loni Beckwith, PA-C 11/04/21 1800

## 2021-11-05 NOTE — ED Notes (Signed)
Patient states she has an appointment with PCP at 3pm and she is leaving

## 2021-11-06 ENCOUNTER — Encounter: Payer: Medicare Other | Admitting: Nurse Practitioner

## 2021-11-12 ENCOUNTER — Telehealth: Payer: Self-pay

## 2021-11-12 ENCOUNTER — Ambulatory Visit (INDEPENDENT_AMBULATORY_CARE_PROVIDER_SITE_OTHER): Payer: Medicare Other

## 2021-11-12 DIAGNOSIS — N1831 Chronic kidney disease, stage 3a: Secondary | ICD-10-CM

## 2021-11-12 DIAGNOSIS — E782 Mixed hyperlipidemia: Secondary | ICD-10-CM

## 2021-11-12 DIAGNOSIS — R7303 Prediabetes: Secondary | ICD-10-CM

## 2021-11-12 DIAGNOSIS — M1991 Primary osteoarthritis, unspecified site: Secondary | ICD-10-CM

## 2021-11-12 DIAGNOSIS — I10 Essential (primary) hypertension: Secondary | ICD-10-CM

## 2021-11-12 NOTE — Chronic Care Management (AMB) (Signed)
Chronic Care Management   CCM RN Visit Note  11/12/2021 Name: Margaret Webster MRN: [id number] DOB: [birth year]  Subjective: Margaret Webster is a 61 y.o. year old female who is a primary care patient of Glendale Chard, MD. The care management team was consulted for assistance with disease management and care coordination needs.    Engaged with patient by telephone for follow up visit in response to provider referral for case management and/or care coordination services.   Consent to Services:  The patient was given information about Chronic Care Management services, agreed to services, and gave verbal consent prior to initiation of services.  Please see initial visit note for detailed documentation.   Patient agreed to services and verbal consent obtained.   Assessment: Review of patient past medical history, allergies, medications, health status, including review of consultants reports, laboratory and other test data, was performed as part of comprehensive evaluation and provision of chronic care management services.   SDOH (Social Determinants of Health) assessments and interventions performed:  Yes, no acute changes   CCM Care Plan  Allergies  Allergen Reactions   Sulfa Antibiotics Other (See Comments)    Hard to breathe   Crestor [Rosuvastatin] Other (See Comments)    Makes her mouth numb and causes side pain   Ibuprofen Other (See Comments)    Stomach upset   Polytrim [Polymyxin B-Trimethoprim] Itching and Swelling    redness   Influenza Vaccines Hives   Lipitor [Atorvastatin Calcium] Other (See Comments)    Muscle ache    Naproxen Nausea And Vomiting    Outpatient Encounter Medications as of 11/12/2021  Medication Sig   amLODipine (NORVASC) 2.5 MG tablet Take 1 tablet (2.5 mg total) by mouth daily.   aspirin EC 81 MG tablet Take 81 mg by mouth daily.   Benzocaine-Menthol (CEPACOL INSTAMAX) 15-20 MG LOZG Use as directed 1 lozenge in the mouth or throat 3 (three) times  daily as needed. (Patient not taking: Reported on 11/04/2021)   benzonatate (TESSALON) 100 MG capsule Take 1 capsule (100 mg total) by mouth 3 (three) times daily as needed for cough. (Patient not taking: Reported on 11/04/2021)   fluticasone (FLONASE) 50 MCG/ACT nasal spray Place 2 sprays into both nostrils daily. (Patient not taking: Reported on 11/04/2021)   gabapentin (NEURONTIN) 100 MG capsule Take 1 capsule (100 mg total) by mouth 3 (three) times daily as needed. (Patient not taking: Reported on 11/04/2021)   HYDROcodone-acetaminophen (NORCO) 5-325 MG tablet 1-2 tabs po q6 hours prn pain (Patient not taking: Reported on 07/31/2021)   losartan (COZAAR) 25 MG tablet Take 12.5 mg by mouth daily.   metFORMIN (GLUCOPHAGE-XR) 500 MG 24 hr tablet Take 1 tablet by mouth once daily with breakfast   NEXLETOL 180 MG TABS TAKE 1 TABLET BY MOUTH AT BEDTIME   nystatin cream (MYCOSTATIN) Apply 1 application topically 2 (two) times daily. (Patient not taking: Reported on 11/04/2021)   nystatin-triamcinolone ointment (MYCOLOG) Apply 1 application topically 2 (two) times daily. (Patient not taking: Reported on 11/04/2021)   triamcinolone cream (KENALOG) 0.5 % Apply 1 application topically 2 (two) times daily. (Patient not taking: Reported on 11/04/2021)   No facility-administered encounter medications on file as of 11/12/2021.    Patient Active Problem List   Diagnosis Date Noted   Depression 07/05/2020   Ringing in ears 07/05/2020   CVA (cerebral vascular accident) ([place]) 02/26/2020   CKD (chronic kidney disease) stage 3, GFR 30-59 ml/min (HCC)    Bradycardia  Elevated TSH    Multiple joint pain 11/07/2019   Acute pain of right shoulder 09/19/2019   Right hip pain 09/19/2019   Prediabetes 09/08/2018   Mass of joint of finger 07/26/2015   Chest pain 01/10/2015   DM (diabetes mellitus) ([place]) 01/10/2015   Hypertension 01/10/2015   Hypothyroidism 01/10/2015   Headache 01/10/2015   Abdominal pain 01/10/2015    Vaginitis 04/07/2014   Pain 04/07/2014   Vaginal irritation 04/07/2014   Boil of buttock 04/06/2014   BV (bacterial vaginosis) 04/06/2014   Unspecified symptom associated with female genital organs 11/16/2013   Candidiasis of vulva and vagina 11/16/2013   Vaginitis and vulvovaginitis, unspecified 06/07/2013   Pain aggravated by activities of daily living 06/07/2013   S/P cervical spinal fusion 01/17/2013   Cervical radiculopathy 12/13/2012   Carpal tunnel syndrome on both sides 12/06/2012   Cervical spondylosis with myelopathy 12/06/2012   Nonspecific abnormal finding in stool contents 12/02/2011   Diabetes mellitus without mention of complication 39/76/7341   Chronic back pain     Conditions to be addressed/monitored: Essential hypertension, Stage 3a chronic kidney disease, Mixed hyperlipidemia, Prediabetes, Primary Osteoarthritis  Care Plan : RN Care Manager Plan of Care  Updates made by Lynne Logan, RN since 11/12/2021 12:00 AM     Problem: No Plan of Care established for management of chronic disease states (Essential hypertension, Stage 3a chronic kidney disease, Mixed hyperlipidemia, Prediabetes, Primary Osteoarthritis)   Priority: High     Long-Range Goal: Establishment of Plan of Care for management of chronic disease states (Essential hypertension, Stage 3a chronic kidney disease, Mixed hyperlipidemia, Prediabetes, Primary Osteoarthritis)   Start Date: 11/12/2021  Expected End Date: 11/12/2022  This Visit's Progress: On track  Priority: High  Note:   Current Barriers:  Knowledge Deficits related to plan of care for management of Essential hypertension, Stage 3a chronic kidney disease, Mixed hyperlipidemia, Prediabetes, Primary Osteoarthritis  Chronic Disease Management support and education needs related to Essential hypertension, Stage 3a chronic kidney disease, Mixed hyperlipidemia, Prediabetes, Primary Osteoarthritis    RNCM Clinical Goal(s):  Patient will  verbalize basic understanding of  Essential hypertension, Stage 3a chronic kidney disease, Mixed hyperlipidemia, Prediabetes disease process and self health management plan as evidenced by patient will report having no disease exacerbations related to his chronic disease states as listed above  take all medications exactly as prescribed and will call provider for medication related questions as evidenced by patient will report having no missed doses of her prescribed medications  demonstrate Improved health management independence as evidenced by patient will report 100% adherence to her prescribed treatment plan  continue to work with RN Care Manager to address care management and care coordination needs related to  Essential hypertension, Stage 3a chronic kidney disease, Mixed hyperlipidemia, Prediabetes as evidenced by adherence to CM Team Scheduled appointments demonstrate ongoing self health care management ability   as evidenced by    through collaboration with RN Care manager, provider, and care team.   Interventions: 1:1 collaboration with primary care provider regarding development and update of comprehensive plan of care as evidenced by provider attestation and co-signature Inter-disciplinary care team collaboration (see longitudinal plan of care) Evaluation of current treatment plan related to  self management and patient's adherence to plan as established by provider  Diabetes Interventions:  (Status:  Goal on track:  Yes.) Long Term Goal Assessed patient's understanding of A1c goal:  <5.7 Provided education to patient about basic DM disease process Reviewed medications with patient and  discussed importance of medication adherence Counseled on importance of regular laboratory monitoring as prescribed Advised patient, providing education and rationale, to check cbg daily before meals and at bedtime and record, calling PCP and or RN CM for findings outside established parameters Review of  patient status, including review of consultants reports, relevant laboratory and other test results, and medications completed Discussed plans with patient for ongoing care management follow up and provided patient with direct contact information for care management team Lab Results  Component Value Date   HGBA1C 6.0 (H) 03/12/2021   Osteoarthritis Interventions:  (Status:  Goal on track:  Yes.) Long Term Goal Evaluation of current treatment plan related to Osteoarthritis, self-management and patient's adherence to plan as established by provider Pain assessment performed Medications reviewed Reviewed provider established plan for pain management Discussed importance of adherence to all scheduled medical appointments Counseled on the importance of reporting any/all new or changed pain symptoms or management strategies to pain management provider Advised patient to report to care team affect of pain on daily activities Discussed use of relaxation techniques and/or diversional activities to assist with pain reduction (distraction, imagery, relaxation, massage, acupressure, TENS, heat, and cold application Reviewed with patient prescribed pharmacological and nonpharmacological pain relief strategies Screening for signs and symptoms of depression related to chronic disease state  Discussed plans with patient for ongoing care management follow up and provided patient with direct contact information for care management team   Hyperlipidemia Interventions:  (Status:  Goal on track:  Yes.) Long Term Goal Medication review performed; medication list updated in electronic medical record.  Provider established cholesterol goals reviewed Counseled on importance of regular laboratory monitoring as prescribed Provided HLD educational materials Reviewed importance of limiting foods high in cholesterol Reviewed exercise goals and target of 150 minutes per week Discussed plans with patient for ongoing care  management follow up and provided patient with direct contact information for care management team  COVID 19 Interventions: (Status:  New goal.)  Short Term Goal Evaluation of current treatment plan related to  COVID 19 Infection , self-management and patient's adherence to plan as established by provider Determined patient tested positive for COVID 19 on 11/01/21 during an Urgent Care visit after experiencing upper respiratory symptoms Discussed a returned visit to the ED on 11/04/21 due to patient states she was having symptoms suggestive of a stroke, she underwent a brain MRI and head CT both were negative for acute changes Discussed today patient continues to have nasal congestion, post nasal drainage, cough and chills, no fever and improved fatigue Discussed patient has an office visit scheduled with Bary Castilla NP on 11/14/21 @9 :67 AM Instructed patient on the importance to stay well hydrated and to add Gatorade zero for electrolytes Instructed patient to continue to balance her activity with rest and to continue self monitoring her cbg's and BP, reporting abnormal readings to PCP promptly  Discussed plans with patient for ongoing care management follow up and provided patient with direct contact information for care management team   Patient Goals/Self-Care Activities: Take all medications as prescribed Attend all scheduled provider appointments Call pharmacy for medication refills 3-7 days in advance of running out of medications Perform IADL's (shopping, preparing meals, housekeeping, managing finances) independently Call provider office for new concerns or questions  adhere to prescribed diet: low trans/Saturated fat develop an exercise routine  Follow Up Plan:  Telephone follow up appointment with care management team member scheduled for:  11/18/21     Plan:Telephone follow up appointment  with care management team member scheduled for:  11/18/21  Barb Merino, RN, BSN, CCM Care  Management Coordinator [place] [facility]  Direct Phone: [phone]

## 2021-11-12 NOTE — Patient Instructions (Addendum)
Visit Information  Thank you for taking time to visit with me today. Please don't hesitate to contact me if I can be of assistance to you before our next scheduled telephone appointment.  Following are the goals we discussed today:  (Copy and paste patient goals from clinical care plan here)  Our next appointment is by telephone on 11/18/21 at 9:30 AM  Please call the care guide team at [phone] if you need to cancel or reschedule your appointment.   If you are experiencing a Mental Health or [place] or need someone to talk to, please call 1-800-273-TALK (toll free, 24 hour hotline)   Patient verbalizes understanding of instructions and care plan provided today and agrees to view in [place]. Active MyChart status confirmed with patient.    Barb Merino, RN, BSN, CCM Care Management Coordinator [place] [facility]  Direct Phone: [phone]

## 2021-11-13 ENCOUNTER — Other Ambulatory Visit: Payer: Self-pay | Admitting: Nurse Practitioner

## 2021-11-13 DIAGNOSIS — R7303 Prediabetes: Secondary | ICD-10-CM

## 2021-11-14 ENCOUNTER — Other Ambulatory Visit: Payer: Self-pay

## 2021-11-14 ENCOUNTER — Ambulatory Visit (INDEPENDENT_AMBULATORY_CARE_PROVIDER_SITE_OTHER): Payer: Commercial Managed Care - HMO | Admitting: Nurse Practitioner

## 2021-11-14 ENCOUNTER — Encounter: Payer: Self-pay | Admitting: Nurse Practitioner

## 2021-11-14 VITALS — BP 132/88 | HR 59 | Temp 98.1°F | Ht 67.8 in | Wt 222.6 lb

## 2021-11-14 DIAGNOSIS — Z1159 Encounter for screening for other viral diseases: Secondary | ICD-10-CM

## 2021-11-14 DIAGNOSIS — I129 Hypertensive chronic kidney disease with stage 1 through stage 4 chronic kidney disease, or unspecified chronic kidney disease: Secondary | ICD-10-CM | POA: Diagnosis not present

## 2021-11-14 DIAGNOSIS — N1831 Chronic kidney disease, stage 3a: Secondary | ICD-10-CM | POA: Diagnosis not present

## 2021-11-14 DIAGNOSIS — Z0000 Encounter for general adult medical examination without abnormal findings: Secondary | ICD-10-CM | POA: Diagnosis not present

## 2021-11-14 DIAGNOSIS — E782 Mixed hyperlipidemia: Secondary | ICD-10-CM | POA: Diagnosis not present

## 2021-11-14 DIAGNOSIS — R21 Rash and other nonspecific skin eruption: Secondary | ICD-10-CM | POA: Diagnosis not present

## 2021-11-14 DIAGNOSIS — M1991 Primary osteoarthritis, unspecified site: Secondary | ICD-10-CM

## 2021-11-14 DIAGNOSIS — R001 Bradycardia, unspecified: Secondary | ICD-10-CM

## 2021-11-14 DIAGNOSIS — R7303 Prediabetes: Secondary | ICD-10-CM | POA: Diagnosis not present

## 2021-11-14 DIAGNOSIS — I10 Essential (primary) hypertension: Secondary | ICD-10-CM | POA: Diagnosis not present

## 2021-11-14 DIAGNOSIS — R1011 Right upper quadrant pain: Secondary | ICD-10-CM

## 2021-11-14 DIAGNOSIS — E559 Vitamin D deficiency, unspecified: Secondary | ICD-10-CM | POA: Diagnosis not present

## 2021-11-14 DIAGNOSIS — E6609 Other obesity due to excess calories: Secondary | ICD-10-CM

## 2021-11-14 DIAGNOSIS — Z6834 Body mass index (BMI) 34.0-34.9, adult: Secondary | ICD-10-CM

## 2021-11-14 LAB — POCT URINALYSIS DIPSTICK
Bilirubin, UA: NEGATIVE
Blood, UA: NEGATIVE
Glucose, UA: NEGATIVE
Ketones, UA: NEGATIVE
Leukocytes, UA: NEGATIVE
Nitrite, UA: NEGATIVE
Protein, UA: POSITIVE — AB
Spec Grav, UA: 1.015 (ref 1.010–1.025)
Urobilinogen, UA: 0.2 E.U./dL
pH, UA: 5.5 (ref 5.0–8.0)

## 2021-11-14 MED ORDER — TRIAMCINOLONE ACETONIDE 0.5 % EX CREA: 1.0000 "application " | TOPICAL_CREAM | Freq: Two times a day (BID) | CUTANEOUS | 0 refills | Status: DC

## 2021-11-14 NOTE — Patient Instructions (Signed)

## 2021-11-14 NOTE — Progress Notes (Signed)
I,Katawbba Wiggins,acting as a Education administrator for [employer], NP.,have documented all relevant documentation on the behalf of [employer], NP,as directed by  Bary Castilla, NP while in the presence of Bary Castilla, NP.  This visit occurred during the SARS-CoV-2 public health emergency.  Safety protocols were in place, including screening questions prior to the visit, additional usage of staff PPE, and extensive cleaning of exam room while observing appropriate contact time as indicated for disinfecting solutions.  Subjective:     Patient ID: Margaret Webster , female    DOB: [birth year] , 61 y.o.   MRN: [id number]   Chief Complaint  Patient presents with   Annual Exam    HPI  Here for hm. The patient is followed by GYN, she will be scheduling with Dr. Jodi Mourning for a pap. She tested positive for covid on 11/01/21 and yesterday she was negative. She feels better now.  She gets her mammogram in Nov. She had one in 2022.  Wt Readings from Last 3 Encounters: 07/23/21 : 231 lb 7.7 oz (105 kg)  Diet: She is trying to eat healthier.  She works everyday Drink or smoke: occasionally. She smokes occasionally mariajuana.  Dentist and eye doctor: yes     Past Medical History:  Diagnosis Date   Arthritis    bil shoulders   Bronchitis    Chronic back pain    Chronic kidney disease    Diabetes mellitus    stopped Mrtformin 1 mo ago, made her "sick"   Hypertension    Hypothyroidism    Stroke [facility])    Thyroid disease    Trigger thumb of right hand      Family History  Problem Relation Age of Onset   Diabetes Mother    Cystic fibrosis Sister    Cancer Sister        ?   Heart disease Maternal Grandmother    Clotting disorder Maternal Grandfather      Current Outpatient Medications:    amLODipine (NORVASC) 2.5 MG tablet, Take 1 tablet (2.5 mg total) by mouth daily., Disp: 90 tablet, Rfl: 0   aspirin EC 81 MG tablet, Take 81 mg by mouth daily., Disp: , Rfl:     Benzocaine-Menthol (CEPACOL INSTAMAX) 15-20 MG LOZG, Use as directed 1 lozenge in the mouth or throat 3 (three) times daily as needed. (Patient not taking: Reported on 11/04/2021), Disp: 16 lozenge, Rfl: 0   benzonatate (TESSALON) 100 MG capsule, Take 1 capsule (100 mg total) by mouth 3 (three) times daily as needed for cough. (Patient not taking: Reported on 11/04/2021), Disp: 21 capsule, Rfl: 0   fluticasone (FLONASE) 50 MCG/ACT nasal spray, Place 2 sprays into both nostrils daily. (Patient not taking: Reported on 11/04/2021), Disp: 16 g, Rfl: 0   gabapentin (NEURONTIN) 100 MG capsule, Take 1 capsule (100 mg total) by mouth 3 (three) times daily as needed. (Patient not taking: Reported on 11/04/2021), Disp: 30 capsule, Rfl: 2   HYDROcodone-acetaminophen (NORCO) 5-325 MG tablet, 1-2 tabs po q6 hours prn pain (Patient not taking: Reported on 07/31/2021), Disp: 20 tablet, Rfl: 0   losartan (COZAAR) 25 MG tablet, Take 12.5 mg by mouth daily., Disp: , Rfl:    metFORMIN (GLUCOPHAGE-XR) 500 MG 24 hr tablet, Take 1 tablet by mouth once daily with breakfast, Disp: 90 tablet, Rfl: 0   NEXLETOL 180 MG TABS, TAKE 1 TABLET BY MOUTH AT BEDTIME, Disp: 90 tablet, Rfl: 0   nystatin cream (MYCOSTATIN), Apply 1 application topically 2 (two)  times daily. (Patient not taking: Reported on 11/04/2021), Disp: 30 g, Rfl: 0   nystatin-triamcinolone ointment (MYCOLOG), Apply 1 application topically 2 (two) times daily. (Patient not taking: Reported on 11/04/2021), Disp: 30 g, Rfl: 0   triamcinolone cream (KENALOG) 0.5 %, Apply 1 application topically 2 (two) times daily., Disp: 30 g, Rfl: 0   Allergies  Allergen Reactions   Sulfa Antibiotics Other (See Comments)    Hard to breathe   Crestor [Rosuvastatin] Other (See Comments)    Makes her mouth numb and causes side pain   Ibuprofen Other (See Comments)    Stomach upset   Polytrim [Polymyxin B-Trimethoprim] Itching and Swelling    redness   Influenza Vaccines Hives   Lipitor  [Atorvastatin Calcium] Other (See Comments)    Muscle ache    Naproxen Nausea And Vomiting     Review of Systems  Constitutional: Negative.  Negative for chills and fever.  HENT: Negative.  Negative for congestion and rhinorrhea.   Eyes: Negative.   Respiratory: Negative.  Negative for cough, shortness of breath and wheezing.   Cardiovascular: Negative.  Negative for chest pain and palpitations.  Gastrointestinal:  Positive for abdominal pain. Negative for constipation, diarrhea and nausea.  Endocrine: Negative.  Negative for polydipsia, polyphagia and polyuria.  Genitourinary: Negative.   Musculoskeletal:  Positive for back pain. Negative for arthralgias and myalgias.  Skin: Negative.   Allergic/Immunologic: Negative.   Neurological: Negative.  Negative for weakness and headaches.  Hematological: Negative.   Psychiatric/Behavioral: Negative.  Negative for sleep disturbance.     Today's Vitals   11/14/21 0939  BP: 132/88  Pulse: (!) 59  Temp: 98.1 F (36.7 C)  Weight: 222 lb 9.6 oz (101 kg)  Height: 5' 7.8" (1.722 m)   Body mass index is 34.05 kg/m.  Wt Readings from Last 3 Encounters:  11/14/21 222 lb 9.6 oz (101 kg)  07/31/21 226 lb 9.6 oz (102.8 kg)  07/23/21 231 lb 7.7 oz (105 kg)    BP Readings from Last 3 Encounters:  11/14/21 132/88  11/05/21 136/69  11/01/21 121/81    Objective:  Physical Exam Vitals and nursing note reviewed.  Constitutional:      Appearance: Normal appearance.  HENT:     Head: Normocephalic and atraumatic.     Right Ear: Tympanic membrane, ear canal and external ear normal.     Left Ear: Tympanic membrane, ear canal and external ear normal.     Nose:     Comments: Masked     Mouth/Throat:     Comments: Masked  Eyes:     Extraocular Movements: Extraocular movements intact.     Conjunctiva/sclera: Conjunctivae normal.     Pupils: Pupils are equal, round, and reactive to light.  Cardiovascular:     Rate and Rhythm: Normal rate and  regular rhythm.     Pulses: Normal pulses.     Heart sounds: No murmur heard. Pulmonary:     Effort: Pulmonary effort is normal. No respiratory distress.     Breath sounds: Normal breath sounds. No wheezing.  Abdominal:     General: Abdomen is flat. Bowel sounds are normal.     Palpations: Abdomen is soft.     Tenderness: There is generalized abdominal tenderness and tenderness in the right upper quadrant. There is no guarding or rebound. Negative signs include Murphy's sign, McBurney's sign and psoas sign.     Comments: Tenderness to RUQ upon palpitation.   Genitourinary:    Comments: deferred Musculoskeletal:  General: Normal range of motion.     Cervical back: Normal range of motion and neck supple.  Skin:    General: Skin is warm and dry.     Capillary Refill: Capillary refill takes less than 2 seconds.  Neurological:     General: No focal deficit present.     Mental Status: She is alert and oriented to person, place, and time.  Psychiatric:        Mood and Affect: Mood normal.        Behavior: Behavior normal.        Assessment And Plan:     1. Encounter for general adult medical examination w/o abnormal findings -Patient is here for their annual physical exam and we discussed any changes to medication and medical history.  -Behavior modification was discussed as well as diet and exercise history  -Patient will continue to exercise regularly and modify their diet.  -Recommendation for yearly physical annuals, immunization and screenings including mammogram and colonoscopy were discussed with the patient.  -Recommended intake of multivitamin, vitamin D and calcium.  -Individualized advise was given to the patient pertaining to their own health history in regards to diet, exercise, medical condition and referrals.  2. Essential hypertension -Chronic, continue meds  - EKG 12-Lead - POCT Urinalysis Dipstick (81002) - Microalbumin / Creatinine Urine Ratio - CBC -  CMP14+EGFR  3. Prediabetes -Chronic, continue meds  - Hemoglobin A1c - CBC - CMP14+EGFR  4. Mixed hyperlipidemia Will check labs today  -Advised patient to decrease intake of fried, fatty and fast food and increase their physical activity.  - Lipid panel  5. Stage 3a chronic kidney disease ([place]) -Will check labs today and assess.  - CBC - CMP14+EGFR  6. Primary osteoarthritis, unspecified site -Advised patient to use OTC Voltaren gel  -Consider referral to orthopedic and PT.   7. Rash and nonspecific skin eruption - triamcinolone cream (KENALOG) 0.5 %; Apply 1 application topically 2 (two) times daily.  Dispense: 30 g; Refill: 0  8. Sinus bradycardia -Will send referral to cardiology to assess further  - Ambulatory referral to Cardiology  9. Right upper quadrant abdominal pain -Will check labs today and assess.  - CMP14+EGFR - US Abdomen Complete; Future - Amylase - Lipase  10. Vitamin D deficiency -will check vit D and assess.  -Will check and supplement if needed. Advised patient to spend atleast 15 min. Daily in sunlight.  - VITAMIN D 25 Hydroxy (Vit-D Deficiency, Fractures)  11. Encounter for hepatitis C screening test for low risk patient - Hepatitis C antibody  12. Class 1 obesity due to excess calories with serious comorbidity and body mass index (BMI) of 34.0 to 34.9 in adult  Advised patient on a healthy diet including avoiding fast food and red meats. Increase the intake of lean meats including grilled chicken and [place].  Drink a lot of water. Decrease intake of fatty foods. Exercise for 30-45 min. 4-5 a week to decrease the risk of cardiac event.   The patient was encouraged to call or send a message through [place] for any questions or concerns.   Follow up: if symptoms persist or do not get better.   Side effects and appropriate use of all the medication(s) were discussed with the patient today. Patient advised to use the medication(s) as directed by  their healthcare provider. The patient was encouraged to read, review, and understand all associated package inserts and contact our office with any questions or concerns. The patient accepts  the risks of the treatment plan and had an opportunity to ask questions.   Staying healthy and adopting a healthy lifestyle for your overall health is important. You should eat 7 or more servings of fruits and vegetables per day. You should drink plenty of water to keep yourself hydrated and your kidneys healthy. This includes about 65-80+ fluid ounces of water. Limit your intake of animal fats especially for elevated cholesterol. Avoid highly processed food and limit your salt intake if you have hypertension. Avoid foods high in saturated/Trans fats. Along with a healthy diet it is also very important to maintain time for yourself to maintain a healthy mental health with low stress levels. You should get atleast 150 min of moderate intensity exercise weekly for a healthy heart. Along with eating right and exercising, aim for at least 7-9 hours of sleep daily.  Eat more whole grains which includes barley, wheat berries, oats, Fifita rice and whole wheat pasta. Use healthy plant oils which include olive, soy, corn, sunflower and peanut. Limit your caffeine and sugary drinks. Limit your intake of fast foods. Limit milk and dairy products to one or two daily servings.   Patient was given opportunity to ask questions. Patient verbalized understanding of the plan and was able to repeat key elements of the plan. All questions were answered to their satisfaction.  Raman Iyonnah Ferrante, DNP   I, Raman Ohn Bostic have reviewed all documentation for this visit. The documentation on 11/14/21 for the exam, diagnosis, procedures, and orders are all accurate and complete.    IF YOU HAVE BEEN REFERRED TO A SPECIALIST, IT MAY TAKE 1-2 WEEKS TO SCHEDULE/PROCESS THE REFERRAL. IF YOU HAVE NOT HEARD FROM US/SPECIALIST IN TWO WEEKS, PLEASE GIVE [place] A  CALL AT [phone] X [phone].   THE PATIENT IS ENCOURAGED TO PRACTICE SOCIAL DISTANCING DUE TO THE COVID-19 PANDEMIC.

## 2021-11-15 ENCOUNTER — Other Ambulatory Visit: Payer: Self-pay | Admitting: Nurse Practitioner

## 2021-11-15 DIAGNOSIS — E559 Vitamin D deficiency, unspecified: Secondary | ICD-10-CM

## 2021-11-15 DIAGNOSIS — E782 Mixed hyperlipidemia: Secondary | ICD-10-CM

## 2021-11-15 DIAGNOSIS — R7303 Prediabetes: Secondary | ICD-10-CM

## 2021-11-15 LAB — VITAMIN D 25 HYDROXY (VIT D DEFICIENCY, FRACTURES): Vit D, 25-Hydroxy: 17 ng/mL — ABNORMAL LOW (ref 30.0–100.0)

## 2021-11-15 LAB — CMP14+EGFR
ALT: 21 IU/L (ref 0–32)
AST: 26 IU/L (ref 0–40)
Albumin/Globulin Ratio: 1.4 (ref 1.2–2.2)
Albumin: 4.7 g/dL (ref 3.8–4.8)
Alkaline Phosphatase: 66 IU/L (ref 44–121)
BUN/Creatinine Ratio: 15 (ref 12–28)
BUN: 21 mg/dL (ref 8–27)
Bilirubin Total: 0.4 mg/dL (ref 0.0–1.2)
CO2: 25 mmol/L (ref 20–29)
Calcium: 10 mg/dL (ref 8.7–10.3)
Chloride: 100 mmol/L (ref 96–106)
Creatinine, Ser: 1.42 mg/dL — ABNORMAL HIGH (ref 0.57–1.00)
Globulin, Total: 3.3 g/dL (ref 1.5–4.5)
Glucose: 88 mg/dL (ref 70–99)
Potassium: 4.3 mmol/L (ref 3.5–5.2)
Sodium: 140 mmol/L (ref 134–144)
Total Protein: 8 g/dL (ref 6.0–8.5)
eGFR: 42 mL/min/{1.73_m2} — ABNORMAL LOW (ref >59)

## 2021-11-15 LAB — CBC
Hematocrit: 47 % — ABNORMAL HIGH (ref 34.0–46.6)
Hemoglobin: 15.9 g/dL (ref 11.1–15.9)
MCH: 31.1 pg (ref 26.6–33.0)
MCHC: 33.8 g/dL (ref 31.5–35.7)
MCV: 92 fL (ref 79–97)
Platelets: 276 10*3/uL (ref 150–450)
RBC: 5.12 x10E6/uL (ref 3.77–5.28)
RDW: 12.6 % (ref 11.7–15.4)
WBC: 4.5 10*3/uL (ref 3.4–10.8)

## 2021-11-15 LAB — LIPASE: Lipase: 29 U/L (ref 14–72)

## 2021-11-15 LAB — LIPID PANEL
Chol/HDL Ratio: 6.3 ratio — ABNORMAL HIGH (ref 0.0–4.4)
Cholesterol, Total: 329 mg/dL — ABNORMAL HIGH (ref 100–199)
HDL: 52 mg/dL (ref >39)
LDL Chol Calc (NIH): 242 mg/dL — ABNORMAL HIGH (ref 0–99)
Triglycerides: 177 mg/dL — ABNORMAL HIGH (ref 0–149)
VLDL Cholesterol Cal: 35 mg/dL (ref 5–40)

## 2021-11-15 LAB — MICROALBUMIN / CREATININE URINE RATIO
Creatinine, Urine: 90 mg/dL
Microalb/Creat Ratio: 335 mg/g creat — ABNORMAL HIGH (ref 0–29)
Microalbumin, Urine: 301.4 ug/mL

## 2021-11-15 LAB — HEMOGLOBIN A1C
Est. average glucose Bld gHb Est-mCnc: 134 mg/dL
Hgb A1c MFr Bld: 6.3 % — ABNORMAL HIGH (ref 4.8–5.6)

## 2021-11-15 LAB — AMYLASE: Amylase: 133 U/L — ABNORMAL HIGH (ref 31–110)

## 2021-11-15 LAB — HEPATITIS C ANTIBODY: Hep C Virus Ab: <0.1 s/co ratio (ref 0.0–0.9)

## 2021-11-15 MED ORDER — VITAMIN D (ERGOCALCIFEROL) 1.25 MG (50000 UNIT) PO CAPS
50000.0000 [IU] | ORAL_CAPSULE | ORAL | 0 refills | Status: DC
Start: 2021-11-15 — End: 2022-05-12

## 2021-11-18 ENCOUNTER — Telehealth: Payer: Commercial Managed Care - HMO

## 2021-11-19 ENCOUNTER — Other Ambulatory Visit: Payer: Self-pay | Admitting: Nurse Practitioner

## 2021-11-19 ENCOUNTER — Telehealth: Payer: Commercial Managed Care - HMO

## 2021-11-19 ENCOUNTER — Ambulatory Visit: Payer: Self-pay

## 2021-11-19 DIAGNOSIS — R7303 Prediabetes: Secondary | ICD-10-CM

## 2021-11-19 DIAGNOSIS — R1011 Right upper quadrant pain: Secondary | ICD-10-CM

## 2021-11-19 DIAGNOSIS — E782 Mixed hyperlipidemia: Secondary | ICD-10-CM

## 2021-11-19 DIAGNOSIS — M1991 Primary osteoarthritis, unspecified site: Secondary | ICD-10-CM

## 2021-11-19 DIAGNOSIS — E210 Primary hyperparathyroidism: Secondary | ICD-10-CM

## 2021-11-19 DIAGNOSIS — I10 Essential (primary) hypertension: Secondary | ICD-10-CM

## 2021-11-19 DIAGNOSIS — N1831 Chronic kidney disease, stage 3a: Secondary | ICD-10-CM

## 2021-11-20 NOTE — Patient Instructions (Signed)
Visit Information  Thank you for taking time to visit with me today. Please don't hesitate to contact me if I can be of assistance to you before our next scheduled telephone appointment.  Following are the goals we discussed today:  (Copy and paste patient goals from clinical care plan here)  Our next appointment is by telephone on 01/17/22 at 11:15 AM  Please call the care guide team at [phone] if you need to cancel or reschedule your appointment.   If you are experiencing a Mental Health or [place] or need someone to talk to, please call 1-800-273-TALK (toll free, 24 hour hotline)   Patient verbalizes understanding of instructions and care plan provided today and agrees to view in [place]. Active MyChart status confirmed with patient.    Barb Merino, RN, BSN, CCM Care Management Coordinator [place] [facility]  Direct Phone: [phone]

## 2021-11-20 NOTE — Chronic Care Management (AMB) (Signed)
Chronic Care Management   CCM RN Visit Note  11/19/2021 Name: Margaret Webster MRN: [id number] DOB: Jul 12, 1961  Subjective: Margaret Webster is a 61 y.o. year old female who is a primary care patient of Glendale Chard, MD. The care management team was consulted for assistance with disease management and care coordination needs.    Engaged with patient by telephone for follow up visit in response to provider referral for case management and/or care coordination services.   Consent to Services:  The patient was given information about Chronic Care Management services, agreed to services, and gave verbal consent prior to initiation of services.  Please see initial visit note for detailed documentation.   Patient agreed to services and verbal consent obtained.   Assessment: Review of patient past medical history, allergies, medications, health status, including review of consultants reports, laboratory and other test data, was performed as part of comprehensive evaluation and provision of chronic care management services.   SDOH (Social Determinants of Health) assessments and interventions performed:  Yes, no acute needs   CCM Care Plan  Allergies  Allergen Reactions   Sulfa Antibiotics Other (See Comments)    Hard to breathe   Crestor [Rosuvastatin] Other (See Comments)    Makes her mouth numb and causes side pain   Ibuprofen Other (See Comments)    Stomach upset   Polytrim [Polymyxin B-Trimethoprim] Itching and Swelling    redness   Influenza Vaccines Hives   Lipitor [Atorvastatin Calcium] Other (See Comments)    Muscle ache    Naproxen Nausea And Vomiting    Outpatient Encounter Medications as of 11/19/2021  Medication Sig   amLODipine (NORVASC) 2.5 MG tablet Take 1 tablet (2.5 mg total) by mouth daily.   aspirin EC 81 MG tablet Take 81 mg by mouth daily.   fluticasone (FLONASE) 50 MCG/ACT nasal spray Place 2 sprays into both nostrils daily.   losartan (COZAAR) 25 MG tablet  Take 12.5 mg by mouth daily.   metFORMIN (GLUCOPHAGE-XR) 500 MG 24 hr tablet Take 1 tablet by mouth once daily with breakfast   NEXLETOL 180 MG TABS TAKE 1 TABLET BY MOUTH AT BEDTIME   triamcinolone cream (KENALOG) 0.5 % Apply 1 application topically 2 (two) times daily.   vitamin C (ASCORBIC ACID) 500 MG tablet Take 500 mg by mouth daily.   Vitamin D, Ergocalciferol, (DRISDOL) 1.25 MG (50000 UNIT) CAPS capsule Take 1 capsule (50,000 Units total) by mouth every 7 (seven) days.   Benzocaine-Menthol (CEPACOL INSTAMAX) 15-20 MG LOZG Use as directed 1 lozenge in the mouth or throat 3 (three) times daily as needed. (Patient not taking: Reported on 11/04/2021)   benzonatate (TESSALON) 100 MG capsule Take 1 capsule (100 mg total) by mouth 3 (three) times daily as needed for cough. (Patient not taking: Reported on 11/04/2021)   gabapentin (NEURONTIN) 100 MG capsule Take 1 capsule (100 mg total) by mouth 3 (three) times daily as needed. (Patient not taking: Reported on 11/04/2021)   HYDROcodone-acetaminophen (NORCO) 5-325 MG tablet 1-2 tabs po q6 hours prn pain (Patient not taking: Reported on 07/31/2021)   nystatin cream (MYCOSTATIN) Apply 1 application topically 2 (two) times daily. (Patient not taking: Reported on 11/04/2021)   nystatin-triamcinolone ointment (MYCOLOG) Apply 1 application topically 2 (two) times daily. (Patient not taking: Reported on 11/04/2021)   No facility-administered encounter medications on file as of 11/19/2021.    Patient Active Problem List   Diagnosis Date Noted   Depression 07/05/2020   Ringing in ears  07/05/2020   CVA (cerebral vascular accident) ([place]) 02/26/2020   CKD (chronic kidney disease) stage 3, GFR 30-59 ml/min (HCC)    Bradycardia    Elevated TSH    Multiple joint pain 11/07/2019   Acute pain of right shoulder 09/19/2019   Right hip pain 09/19/2019   Prediabetes 09/08/2018   Mass of joint of finger 07/26/2015   Chest pain 01/10/2015   DM (diabetes mellitus) ([place])  01/10/2015   Hypertension 01/10/2015   Hypothyroidism 01/10/2015   Headache 01/10/2015   Abdominal pain 01/10/2015   Vaginitis 04/07/2014   Pain 04/07/2014   Vaginal irritation 04/07/2014   Boil of buttock 04/06/2014   BV (bacterial vaginosis) 04/06/2014   Unspecified symptom associated with female genital organs 11/16/2013   Candidiasis of vulva and vagina 11/16/2013   Vaginitis and vulvovaginitis, unspecified 06/07/2013   Pain aggravated by activities of daily living 06/07/2013   S/P cervical spinal fusion 01/17/2013   Cervical radiculopathy 12/13/2012   Carpal tunnel syndrome on both sides 12/06/2012   Cervical spondylosis with myelopathy 12/06/2012   Nonspecific abnormal finding in stool contents 12/02/2011   Diabetes mellitus without mention of complication 10/19/8249   Chronic back pain     Conditions to be addressed/monitored: Essential hypertension, Stage 3a chronic kidney disease, Mixed hyperlipidemia, Prediabetes, Primary Osteoarthritis  Care Plan : RN Care Manager Plan of Care  Updates made by Lynne Logan, RN since 11/19/2021 12:00 AM     Problem: No Plan of Care established for management of chronic disease states (Essential hypertension, Stage 3a chronic kidney disease, Mixed hyperlipidemia, Prediabetes, Primary Osteoarthritis)   Priority: High     Long-Range Goal: Establishment of Plan of Care for management of chronic disease states (Essential hypertension, Stage 3a chronic kidney disease, Mixed hyperlipidemia, Prediabetes, Primary Osteoarthritis)   Start Date: 11/12/2021  Expected End Date: 11/12/2022  Recent Progress: On track  Priority: High  Note:   Current Barriers:  Knowledge Deficits related to plan of care for management of Essential hypertension, Stage 3a chronic kidney disease, Mixed hyperlipidemia, Prediabetes, Primary Osteoarthritis  Chronic Disease Management support and education needs related to Essential hypertension, Stage 3a chronic kidney  disease, Mixed hyperlipidemia, Prediabetes, Primary Osteoarthritis    RNCM Clinical Goal(s):  Patient will verbalize basic understanding of  Essential hypertension, Stage 3a chronic kidney disease, Mixed hyperlipidemia, Prediabetes disease process and self health management plan as evidenced by patient will report having no disease exacerbations related to his chronic disease states as listed above  take all medications exactly as prescribed and will call provider for medication related questions as evidenced by patient will report having no missed doses of her prescribed medications  demonstrate Improved health management independence as evidenced by patient will report 100% adherence to her prescribed treatment plan  continue to work with RN Care Manager to address care management and care coordination needs related to  Essential hypertension, Stage 3a chronic kidney disease, Mixed hyperlipidemia, Prediabetes as evidenced by adherence to CM Team Scheduled appointments demonstrate ongoing self health care management ability   as evidenced by    through collaboration with RN Care manager, provider, and care team.   Interventions: 1:1 collaboration with primary care provider regarding development and update of comprehensive plan of care as evidenced by provider attestation and co-signature Inter-disciplinary care team collaboration (see longitudinal plan of care) Evaluation of current treatment plan related to  self management and patient's adherence to plan as established by provider  Diabetes Interventions:  (Status:  Goal on track:  Yes.) Long Term Goal Assessed patient's understanding of A1c goal: <6.5% Review of patient status, including review of consultant's reports, relevant laboratory and other test results, and medications completed. Reviewed medications with patient and discussed importance of medication adherence Educated patient on dietary and exercise recommendations; daily glycemic  control FBS 80-130, <180 after meals;15'15' rule Advised patient, providing education and rationale, to check cbg daily before meals and at bedtime and record, calling the CCM team and or PCP for findings outside established parameters Discussed plans with patient for ongoing care management follow up and provided patient with direct contact information for care management team Lab Results  Component Value Date   HGBA1C 6.3 (H) 11/14/2021    Osteoarthritis Interventions:  (Status:  Condition stable.  Not addressed this visit.) Long Term Goal Evaluation of current treatment plan related to Osteoarthritis, self-management and patient's adherence to plan as established by provider Pain assessment performed Medications reviewed Reviewed provider established plan for pain management Discussed importance of adherence to all scheduled medical appointments Counseled on the importance of reporting any/all new or changed pain symptoms or management strategies to pain management provider Advised patient to report to care team affect of pain on daily activities Discussed use of relaxation techniques and/or diversional activities to assist with pain reduction (distraction, imagery, relaxation, massage, acupressure, TENS, heat, and cold application Reviewed with patient prescribed pharmacological and nonpharmacological pain relief strategies Screening for signs and symptoms of depression related to chronic disease state  Discussed plans with patient for ongoing care management follow up and provided patient with direct contact information for care management team   Hyperlipidemia Interventions:  (Status:  Goal on track:  Yes.) Long Term Goal Evaluation of current treatment plan related to Hyperlipidemia, self-management and patient's adherence to plan as established by provider Review of patient status, including review of consultant's reports, relevant laboratory and other test results, and medications  completed Reviewed medications with patient and discussed importance of medication adherence Provider established cholesterol goals reviewed Counseled on importance of regular laboratory monitoring as prescribed Reviewed importance of limiting foods high in cholesterol Reviewed exercise goals and target of 150 minutes per week Mailed printed educational materials related to Cholesterol Management  Reviewed and discussed PCP referral to Cardiology for lipid management Reviewed scheduled/upcoming provider appointments including: initial follow up with Dr. Debara Pickett scheduled for 04/15/22 $RemoveBef'@2'nytEyukVjR$ :30 PM Discussed patient will contact the Cardiology office to request to be added on the cancellation list in order to try to get an earlier appointment Discussed plans with patient for ongoing care management follow up and provided patient with direct contact information for care management team Lipid Panel     Component Value Date/Time   CHOL 329 (H) 11/14/2021 1027   TRIG 177 (H) 11/14/2021 1027   HDL 52 11/14/2021 1027   CHOLHDL 6.3 (H) 11/14/2021 1027   CHOLHDL 7.1 02/27/2020 0234   VLDL 37 02/27/2020 0234   LDLCALC 242 (H) 11/14/2021 1027   LABVLDL 35 11/14/2021 1027     Sinus Bradycardia Interventions:  (Status:  New goal.)  Short Term Goal Evaluation of current treatment plan related to  Sinus Bradycardia , self-management and patient's adherence to plan as established by provider Discussed PCP referral for Cardiology to further evaluation of patient's bradycardia Reviewed scheduled/upcoming provider appointments including: Dr. Rudean Haskell scheduled for 12/04/21 $RemoveBe'@3'evNXDylGT$ :00 PM Discussed plans with patient for ongoing care management follow up and provided patient with direct contact information for care management team  Right upper quadrant abdominal pain Interventions: (Status:  new goal.) Short Term Goal  Evaluation of current treatment plan related to Right upper quadrant abdominal pain,  self-management and patient's adherence to plan as established by provider Discussed PCP recommendations for US Abdomen Complete for further evaluation, CMP 14+EGFR, Amylase, Lipase Educated patient on referral process, discussed call should come from [place] to schedule US Abdomen Discussed plans with patient for ongoing care management follow up and provided patient with direct contact information for care management team  COVID 19 Interventions: (Status:  Goal Met.)  Short Term Goal Evaluation of current treatment plan related to  COVID 19 Infection , self-management and patient's adherence to plan as established by provider Discussed and reviewed recent PCP follow up on recent COVID 19 infection and other chronic disease states Determined patient feels she has recovered from [place] 19 although she continues to suffer from post nasal drip causing occasional cough Reviewed medications with patient and discussed importance of medication adherence Educated patient on CDC recommendations for COVID 19 booster vaccine and when to receive the next booster per these guidelines  Encouraged patient to continue to follow precautions against COVID 19 as directed  Discussed plans with patient for ongoing care management follow up and provided patient with direct contact information for care management team   Patient Goals/Self-Care Activities: Take all medications as prescribed Attend all scheduled provider appointments Call pharmacy for medication refills 3-7 days in advance of running out of medications Perform IADL's (shopping, preparing meals, housekeeping, managing finances) independently Call provider office for new concerns or questions  adhere to prescribed diet: low trans/Saturated fat develop an exercise routine  Follow Up Plan:  Telephone follow up appointment with care management team member scheduled for:  01/17/22      Plan:Telephone follow up appointment with care management  team member scheduled for:  01/17/22  Barb Merino, RN, BSN, CCM Care Management Coordinator [facility]  Direct Phone: [phone]

## 2021-11-26 DIAGNOSIS — M1991 Primary osteoarthritis, unspecified site: Secondary | ICD-10-CM

## 2021-11-26 DIAGNOSIS — I10 Essential (primary) hypertension: Secondary | ICD-10-CM

## 2021-11-26 DIAGNOSIS — E782 Mixed hyperlipidemia: Secondary | ICD-10-CM | POA: Diagnosis not present

## 2021-11-26 DIAGNOSIS — N1831 Chronic kidney disease, stage 3a: Secondary | ICD-10-CM | POA: Diagnosis not present

## 2021-11-28 ENCOUNTER — Ambulatory Visit
Admission: RE | Admit: 2021-11-28 | Discharge: 2021-11-28 | Disposition: A | Discharge status: Home / Self Care | Payer: Medicare Other | Source: Ambulatory Visit | Attending: Nurse Practitioner | Admitting: Nurse Practitioner

## 2021-11-28 DIAGNOSIS — R1011 Right upper quadrant pain: Secondary | ICD-10-CM | POA: Diagnosis not present

## 2021-11-28 DIAGNOSIS — K760 Fatty (change of) liver, not elsewhere classified: Secondary | ICD-10-CM | POA: Diagnosis not present

## 2021-12-03 ENCOUNTER — Encounter: Payer: Self-pay | Admitting: Nurse Practitioner

## 2021-12-03 ENCOUNTER — Other Ambulatory Visit (HOSPITAL_COMMUNITY)
Admission: RE | Admit: 2021-12-03 | Discharge: 2021-12-03 | Disposition: A | Discharge status: Home / Self Care | Payer: Medicare Other | Source: Ambulatory Visit | Attending: Obstetrics | Admitting: Obstetrics

## 2021-12-03 ENCOUNTER — Other Ambulatory Visit: Payer: Self-pay

## 2021-12-03 ENCOUNTER — Ambulatory Visit (INDEPENDENT_AMBULATORY_CARE_PROVIDER_SITE_OTHER): Payer: Medicare Other | Admitting: Nurse Practitioner

## 2021-12-03 ENCOUNTER — Encounter: Payer: Self-pay | Admitting: Obstetrics

## 2021-12-03 ENCOUNTER — Ambulatory Visit (INDEPENDENT_AMBULATORY_CARE_PROVIDER_SITE_OTHER): Payer: Medicare Other | Admitting: Obstetrics

## 2021-12-03 VITALS — BP 129/78 | HR 61 | Ht 67.0 in | Wt 222.6 lb

## 2021-12-03 VITALS — BP 138/80 | HR 71 | Temp 98.0°F | Ht 67.0 in | Wt 220.6 lb

## 2021-12-03 DIAGNOSIS — E6609 Other obesity due to excess calories: Secondary | ICD-10-CM

## 2021-12-03 DIAGNOSIS — F17200 Nicotine dependence, unspecified, uncomplicated: Secondary | ICD-10-CM

## 2021-12-03 DIAGNOSIS — Z1151 Encounter for screening for human papillomavirus (HPV): Secondary | ICD-10-CM | POA: Diagnosis not present

## 2021-12-03 DIAGNOSIS — R6889 Other general symptoms and signs: Secondary | ICD-10-CM

## 2021-12-03 DIAGNOSIS — N898 Other specified noninflammatory disorders of vagina: Secondary | ICD-10-CM

## 2021-12-03 DIAGNOSIS — E66811 Obesity, class 1: Secondary | ICD-10-CM

## 2021-12-03 DIAGNOSIS — R102 Pelvic and perineal pain: Secondary | ICD-10-CM | POA: Diagnosis not present

## 2021-12-03 DIAGNOSIS — Z0000 Encounter for general adult medical examination without abnormal findings: Secondary | ICD-10-CM | POA: Diagnosis not present

## 2021-12-03 DIAGNOSIS — K5900 Constipation, unspecified: Secondary | ICD-10-CM | POA: Diagnosis not present

## 2021-12-03 DIAGNOSIS — Z01419 Encounter for gynecological examination (general) (routine) without abnormal findings: Secondary | ICD-10-CM

## 2021-12-03 DIAGNOSIS — Z113 Encounter for screening for infections with a predominantly sexual mode of transmission: Secondary | ICD-10-CM

## 2021-12-03 DIAGNOSIS — E669 Obesity, unspecified: Secondary | ICD-10-CM

## 2021-12-03 DIAGNOSIS — Z6834 Body mass index (BMI) 34.0-34.9, adult: Secondary | ICD-10-CM

## 2021-12-03 DIAGNOSIS — M25551 Pain in right hip: Secondary | ICD-10-CM

## 2021-12-03 MED ORDER — HYDROCODONE-ACETAMINOPHEN 5-325 MG PO TABS: ORAL_TABLET | ORAL | 0 refills | Status: DC

## 2021-12-03 MED ORDER — FLUCONAZOLE 150 MG PO TABS
150.0000 mg | ORAL_TABLET | Freq: Once | ORAL | 2 refills | Status: AC
Start: 2021-12-03 — End: 2021-12-03

## 2021-12-03 NOTE — Progress Notes (Signed)
Patient presents for AEX. Endorses R sided pelvic pain, vaginal itching. Desires STD testing, bloodwork. Last mammo was 3-4 months ago per patient. Barkley Boards, RN

## 2021-12-03 NOTE — Progress Notes (Signed)
Cardiology Office Note:    Date:  12/04/2021   ID:  SKYELAR HALLIDAY, DOB 1961-05-29, MRN [id number]  PCP:  Glendale Chard, MD   [facility] Providers Cardiologist:  Werner Lean, MD     Referring MD: Bary Castilla, NP   CC: slow heart rate Consulted for the evaluation of sinus bradycardia at the behest of Glendale Chard, MD  History of Present Illness:    Margaret Webster is a 61 y.o. female with a hx of HTN, with DM, proir CVA, FH, CKD NOS, tobacco abuse who presents for evaluation.  Patient notes that she is feeling better.    She notes that she had COVID-19, was feeling tired.  In recovery saw PCP and had slow heart.  Called for evaluation.  No prior AV nodal agents per patients.  No palpitations or funny heart beats.  Still tired but improved.  Has stopped some her natural remedies for COVID-19, things are improving.   Has had no chest pain, chest pressure, chest tightness, chest stinging.   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  No shortness of breath, DOE .  No PND or orthopnea.  No weight gain, leg swelling , or abdominal swelling.  No syncope or near syncope . Notes  no palpitations or funny heart beats.     Notes residual right sided tingling of arm and leg.  Was worked to cut back starches and fast food.   Past Medical History:  Diagnosis Date   Arthritis    bil shoulders   Bronchitis    Chronic back pain    Chronic kidney disease    Diabetes mellitus    stopped Mrtformin 1 mo ago, made her "sick"   Fatty liver    Hypertension    Hypothyroidism    Stroke [facility])    Thyroid disease    Trigger thumb of right hand     Past Surgical History:  Procedure Laterality Date   BACK SURGERY  1993   CARPAL TUNNEL RELEASE  08/2015   CARPAL TUNNEL RELEASE Left 09/10/2017   Procedure: CARPAL TUNNEL RELEASE;  Surgeon: Leanora Cover, MD;  Location: [place];  Service: Orthopedics;  Laterality: Left;   CERVICAL  SPINE SURGERY Right 11/2012   OOPHORECTOMY     TRIGGER FINGER RELEASE Right 08/06/2017   Procedure: RIGHT THUMB TRIGGER RELEASE;  Surgeon: Leanora Cover, MD;  Location: [place];  Service: Orthopedics;  Laterality: Right;   ULNAR COLLATERAL LIGAMENT REPAIR Right 03/21/2021   Procedure: RIGHT THUMB ULNAR COLLATERAL LIGAMENT REPAIR;  Surgeon: Leanora Cover, MD;  Location: [place];  Service: Orthopedics;  Laterality: Right;    Current Medications: Current Meds  Medication Sig   amLODipine (NORVASC) 2.5 MG tablet Take 1 tablet (2.5 mg total) by mouth daily.   aspirin EC 81 MG tablet Take 81 mg by mouth daily.   ezetimibe (ZETIA) 10 MG tablet Take 1 tablet (10 mg total) by mouth daily.   gabapentin (NEURONTIN) 100 MG capsule Take 1 capsule (100 mg total) by mouth 3 (three) times daily as needed.   HYDROcodone-acetaminophen (NORCO) 5-325 MG tablet 1-2 tabs po q6 hours prn pain   losartan (COZAAR) 25 MG tablet Take 12.5 mg by mouth daily.   metFORMIN (GLUCOPHAGE-XR) 500 MG 24 hr tablet Take 1 tablet by mouth once daily with breakfast   NEXLETOL 180 MG TABS TAKE 1 TABLET BY MOUTH AT BEDTIME   nystatin cream (MYCOSTATIN) Apply 1  application topically 2 (two) times daily.   nystatin-triamcinolone ointment (MYCOLOG) Apply 1 application topically 2 (two) times daily.   triamcinolone cream (KENALOG) 0.5 % Apply 1 application topically 2 (two) times daily.   vitamin C (ASCORBIC ACID) 500 MG tablet Take 500 mg by mouth daily.   Vitamin D, Ergocalciferol, (DRISDOL) 1.25 MG (50000 UNIT) CAPS capsule Take 1 capsule (50,000 Units total) by mouth every 7 (seven) days.     Allergies:   Sulfa antibiotics, Crestor [rosuvastatin], Ibuprofen, Polytrim [polymyxin b-trimethoprim], Influenza vaccines, Lipitor [atorvastatin calcium], and Naproxen   Social History   Socioeconomic History   Marital status: Single    Spouse name: Not on file   Number of children: 1   Years of  education: Not on file   Highest education level: Not on file  Occupational History   Occupation: DISABLED    Employer: DISABLED  Tobacco Use   Smoking status: Every Day    Packs/day: 0.25    Types: Cigarettes   Smokeless tobacco: Never   Tobacco comments:    4 cig a day, not interested in chantix  Vaping Use   Vaping Use: Never used  Substance and Sexual Activity   Alcohol use: Yes    Comment: occasional   Drug use: Yes    Types: Marijuana   Sexual activity: Yes    Partners: Male    Birth control/protection: None  Other Topics Concern   Not on file  Social History Narrative   Not on file   Social Determinants of Health   Financial Resource Strain: Low Risk    Difficulty of Paying Living Expenses: Not hard at all  Food Insecurity: No Food Insecurity   Worried About Charity fundraiser in the Last Year: Never true   Ran Out of Food in the Last Year: Never true  Transportation Needs: No Transportation Needs   Lack of Transportation (Medical): No   Lack of Transportation (Non-Medical): No  Physical Activity: Inactive   Days of Exercise per Week: 0 days   Minutes of Exercise per Session: 0 min  Stress: No Stress Concern Present   Feeling of Stress : Not at all  Social Connections: Not on file     Family History: The patient's family history includes Cancer in her sister; Clotting disorder in her maternal grandfather; Cystic fibrosis in her sister; Diabetes in her mother; Heart disease in her maternal grandmother.  ROS:   Please see the history of present illness.     All other systems reviewed and are negative.  EKGs/Labs/Other Studies Reviewed:    The following studies were reviewed today:  EKG:  EKG is  ordered today.  The ekg ordered today demonstrates  12/04/21: SR rate 70 inferior TWI   Recent Labs: 11/14/2021: ALT 21; BUN 21; Creatinine, Ser 1.42; Hemoglobin 15.9; Platelets 276; Potassium 4.3; Sodium 140  Recent Lipid Panel    Component Value Date/Time    CHOL 329 (H) 11/14/2021 1027   TRIG 177 (H) 11/14/2021 1027   HDL 52 11/14/2021 1027   CHOLHDL 6.3 (H) 11/14/2021 1027   CHOLHDL 7.1 02/27/2020 0234   VLDL 37 02/27/2020 0234   LDLCALC 242 (H) 11/14/2021 1027       Physical Exam:    VS:  BP 107/68    Pulse 70    Ht 5\' 7"  (1.702 m)    Wt 101.2 kg    SpO2 96%    BMI 34.93 kg/m     Wt Readings from Last  3 Encounters:  12/04/21 101.2 kg  12/03/21 101 kg  12/03/21 100.1 kg     Gen: No distress, Morbid Obesity   Neck: No JVD,  Cardiac: No Rubs or Gallops, holosystolic Murmur, RRR +2 radial pulses Respiratory: Clear to auscultation bilaterally, normal effort, normal  respiratory rate GI: Soft, nontender, non-distended  MS: No  edema; moves all extremities Integument: Skin feels warm Neuro:  At time of evaluation, alert and oriented to person/place/time/situation, notes residual R sided tingling since stroke Psych: Normal affect, patient feels OK,  ASSESSMENT:    1. Cerebrovascular accident (CVA) due to thrombosis of left middle cerebral artery (HCC)   2. Stage 3a chronic kidney disease ([place])   3. Bradycardia   4. Type 2 diabetes mellitus with other circulatory complication, without long-term current use of insulin (HCC)    PLAN:    Bradycardia- resolved and may have been related to her supplements for COVID-19 TWI with no sx- low threshold for lexiscan; have discussed this with patient HTN, with DM, proir CVA, FH, CKD NOS, tobacco abuse Statin myalgias - discussed smoking cessation - LDL goa < 55 - will start zetia 10 mg, then likely pharm D visit after results, needs PCSK9i. - will get echo  Four months of me or or PA      Medication Adjustments/Labs and Tests Ordered: Current medicines are reviewed at length with the patient today.  Concerns regarding medicines are outlined above.  Orders Placed This Encounter  Procedures   Lipid panel   ALT   ECHOCARDIOGRAM COMPLETE   Meds ordered this encounter  Medications    ezetimibe (ZETIA) 10 MG tablet    Sig: Take 1 tablet (10 mg total) by mouth daily.    Dispense:  90 tablet    Refill:  3    Patient Instructions  Medication Instructions:  Your physician has recommended you make the following change in your medication:  START: ezetimibe (Zetia) 10 mg by mouth once daily around dinner time  *If you need a refill on your cardiac medications before your next appointment, please call your pharmacy*   Lab Work: IN 2 MONTHS: FLP, ALT (please fast nothing to eat or drink except water 8-12 hours prior)   If you have labs (blood work) drawn today and your tests are completely normal, you will receive your results only by: [place] (if you have MyChart) OR A paper copy in the mail If you have any lab test that is abnormal or we need to change your treatment, we will call you to review the results.   Testing/Procedures: Your physician has requested that you have an echocardiogram. Echocardiography is a painless test that uses sound waves to create images of your heart. It provides your doctor with information about the size and shape of your heart and how well your hearts chambers and valves are working. This procedure takes approximately one hour. There are no restrictions for this procedure.    Follow-Up: At [facility], you and your health needs are our priority.  As part of our continuing mission to provide you with exceptional heart care, we have created designated Provider Care Teams.  These Care Teams include your primary Cardiologist (physician) and Advanced Practice Providers (APPs -  Physician Assistants and Nurse Practitioners) who all work together to provide you with the care you need, when you need it.  We recommend signing up for the patient portal called "MyChart".  Sign up information is provided on this After Visit Summary.  MyChart is used to connect with patients for Virtual Visits (Telemedicine).  Patients are able to view  lab/test results, encounter notes, upcoming appointments, etc.  Non-urgent messages can be sent to your provider as well.   To learn more about what you can do with MyChart, go to [email or web address].    Your next appointment:   4 month(s)  The format for your next appointment:   In Person  Provider:   Werner Lean, MD       Signed, Werner Lean, MD  12/04/2021 5:22 PM    [place]

## 2021-12-03 NOTE — Patient Instructions (Addendum)
Preventing Peripheral Vascular Disease Peripheral vascular disease (PVD) is a condition of the blood vessels caused by the blocking or hardening of the arteries anywhere within the circulatory system beyond the heart. This condition is also called peripheral artery disease (PAD) or poor circulation. Damage happens when fat, cholesterol, and other substances (plaque) build up in the blood and narrow the arteries that carry blood to other areas of the body. This damage is called atherosclerosis. The blood vessels that carry blood from the heart to the legs are most commonly affected. Other commonly affected blood vessels are those in the arms, kidneys, and stomach. How can peripheral vascular disease affect me? PVD affects the way blood flows to areas of your body. It can cause: Intermittent claudication. This is pain, cramps, and weakness in the buttocks, legs, and feet during activity. This resolves with rest. Pain, numbness, tingling, or weakness in areas of poor blood flow. Skin changes, hair loss, and thickened toenails. Open wounds (ulcers) and sores on toes, feet, or legs. The ulcers or sores may take longer than normal to heal. Tissue death in areas with poor blood flow (gangrene). Surgery to remove these areas of dead tissue may be necessary. This may lead to removal of a leg (amputation). PVD also increases your risk of serious health problems, including: Stroke. A "warning stroke" that causes stroke-like symptoms (transient ischemic attack or TIA). Heart disease. Heart attack. What actions can I take to prevent peripheral vascular disease? Your daily lifestyle habits can affect your risk of PVD. To lower your risk: Do not use any products that contain nicotine or tobacco. These products include cigarettes, chewing tobacco, and vaping devices, such as e-cigarettes. If you need help quitting, ask your health care provider. Avoid drinking alcohol. Maintain a healthy body weight. Get regular  exercise as directed by your health care provider. Eat a heart-healthy diet. Choose low-fat, low-sodium foods, and eat plenty of whole grains, fruits, and vegetables. It is also important to manage any long-term (chronic) health conditions that can increase your risk of PVD. These include: Diabetes. Heart disease. High blood pressure. High cholesterol. What can happen if changes are not made? If you do not take steps to prevent PVD, you may develop PVD or serious complications of PVD. This can result in: Intermittent claudication. Night pain or pain at rest. Acute limb ischemia. This is loss of blood flow to a leg. This causes severe pain and numbness. Delayed or poor wound healing. This can lead to infections. If these are not treated properly, they can lead to an amputation. Stroke, heart disease, or heart attack. Where to find more information Learn more about PVD from: Society for Vascular Surgery: vascular.org American Heart Association: www.heart.org National Heart, Lung, and Blood Institute: https://wilson-eaton.com/ Contact a health care provider if you have: New pain in your legs, arms, or other areas of your body. Signs of infection, such as redness or swelling. A sore on your leg or foot that is not healing. Get help right away if you have: Chest pain. Trouble talking or moving one side of your body. These symptoms may represent a serious problem that is an emergency. Do not wait to see if the symptoms will go away. Get medical help right away. Call your local emergency services (911 in the [place] Do not drive yourself to the hospital. Summary PVD is a long-term (chronic) condition that can affect blood vessels throughout the body. PVD can increase your risk of heart disease, heart attack, and stroke. You can  take steps to prevent PVD by making healthy lifestyle choices, like quitting smoking, eating a heart-healthy diet, and exercising regularly. Work with your health care provider  to manage chronic medical conditions that increase your risk of developing PVD. Get help right away if you have chest pain or trouble moving one side of your body. This information is not intended to replace advice given to you by your health care provider. Make sure you discuss any questions you have with your health care provider. Document Revised: 04/16/2020 Document Reviewed: 04/16/2020 Elsevier Patient Education  [place] benefiber daily as needed to help with constipation

## 2021-12-03 NOTE — Progress Notes (Signed)
Subjective:        Margaret Webster is a 61 y.o. female here for a routine exam.  Current complaints: Pelvic pain. Vaginal discharge.    Personal health questionnaire:  Is patient Ashkenazi Jewish, have a family history of breast and/or ovarian cancer: no Is there a family history of uterine cancer diagnosed at age < 40, gastrointestinal cancer, urinary tract cancer, family member who is a Field seismologist syndrome-associated carrier: no Is the patient overweight and hypertensive, family history of diabetes, personal history of gestational diabetes, preeclampsia or PCOS: yes Is patient over 76, have PCOS,  family history of premature CHD under age 42, diabetes, smoke, have hypertension or peripheral artery disease:  no At any time, has a partner hit, kicked or otherwise hurt or frightened you?: no Over the past 2 weeks, have you felt down, depressed or hopeless?: no Over the past 2 weeks, have you felt little interest or pleasure in doing things?:no   Gynecologic History No LMP recorded. Patient is postmenopausal. Contraception: post menopausal status Last Pap: 05-09-2019. Results were: normal Last mammogram: 2022. Results were: normal  Obstetric History OB History  Gravida Para Term Preterm AB Living  1 0 0 0 0 1  SAB IAB Ectopic Multiple Live Births  0 0 0 0 1    # Outcome Date GA Lbr Len/2nd Weight Sex Delivery Anes PTL Lv  1 Gravida 03/25/76    F Vag-Spont   LIV    Past Medical History:  Diagnosis Date   Arthritis    bil shoulders   Bronchitis    Chronic back pain    Chronic kidney disease    Diabetes mellitus    stopped Mrtformin 1 mo ago, made her "sick"   Fatty liver    Hypertension    Hypothyroidism    Stroke [facility])    Thyroid disease    Trigger thumb of right hand     Past Surgical History:  Procedure Laterality Date   BACK SURGERY  1993   CARPAL TUNNEL RELEASE  08/2015   CARPAL TUNNEL RELEASE Left 09/10/2017   Procedure: CARPAL TUNNEL RELEASE;  Surgeon: Leanora Cover, MD;  Location: [place];  Service: Orthopedics;  Laterality: Left;   CERVICAL SPINE SURGERY Right 11/2012   OOPHORECTOMY     TRIGGER FINGER RELEASE Right 08/06/2017   Procedure: RIGHT THUMB TRIGGER RELEASE;  Surgeon: Leanora Cover, MD;  Location: [place];  Service: Orthopedics;  Laterality: Right;   ULNAR COLLATERAL LIGAMENT REPAIR Right 03/21/2021   Procedure: RIGHT THUMB ULNAR COLLATERAL LIGAMENT REPAIR;  Surgeon: Leanora Cover, MD;  Location: [place];  Service: Orthopedics;  Laterality: Right;     Current Outpatient Medications:    amLODipine (NORVASC) 2.5 MG tablet, Take 1 tablet (2.5 mg total) by mouth daily., Disp: 90 tablet, Rfl: 0   aspirin EC 81 MG tablet, Take 81 mg by mouth daily., Disp: , Rfl:    losartan (COZAAR) 25 MG tablet, Take 12.5 mg by mouth daily., Disp: , Rfl:    metFORMIN (GLUCOPHAGE-XR) 500 MG 24 hr tablet, Take 1 tablet by mouth once daily with breakfast, Disp: 90 tablet, Rfl: 0   NEXLETOL 180 MG TABS, TAKE 1 TABLET BY MOUTH AT BEDTIME, Disp: 90 tablet, Rfl: 0   nystatin cream (MYCOSTATIN), Apply 1 application topically 2 (two) times daily., Disp: 30 g, Rfl: 0   vitamin C (ASCORBIC ACID) 500 MG tablet, Take 500 mg by mouth daily., Disp: , Rfl:  Vitamin D, Ergocalciferol, (DRISDOL) 1.25 MG (50000 UNIT) CAPS capsule, Take 1 capsule (50,000 Units total) by mouth every 7 (seven) days., Disp: 12 capsule, Rfl: 0   Benzocaine-Menthol (CEPACOL INSTAMAX) 15-20 MG LOZG, Use as directed 1 lozenge in the mouth or throat 3 (three) times daily as needed. (Patient not taking: Reported on 11/04/2021), Disp: 16 lozenge, Rfl: 0   benzonatate (TESSALON) 100 MG capsule, Take 1 capsule (100 mg total) by mouth 3 (three) times daily as needed for cough. (Patient not taking: Reported on 11/04/2021), Disp: 21 capsule, Rfl: 0   fluticasone (FLONASE) 50 MCG/ACT nasal spray, Place 2 sprays into both nostrils daily. (Patient not taking: Reported  on 12/03/2021), Disp: 16 g, Rfl: 0   gabapentin (NEURONTIN) 100 MG capsule, Take 1 capsule (100 mg total) by mouth 3 (three) times daily as needed. (Patient not taking: Reported on 11/04/2021), Disp: 30 capsule, Rfl: 2   HYDROcodone-acetaminophen (NORCO) 5-325 MG tablet, 1-2 tabs po q6 hours prn pain (Patient not taking: Reported on 07/31/2021), Disp: 20 tablet, Rfl: 0   nystatin-triamcinolone ointment (MYCOLOG), Apply 1 application topically 2 (two) times daily. (Patient not taking: Reported on 11/04/2021), Disp: 30 g, Rfl: 0   triamcinolone cream (KENALOG) 0.5 %, Apply 1 application topically 2 (two) times daily. (Patient not taking: Reported on 12/03/2021), Disp: 30 g, Rfl: 0 Allergies  Allergen Reactions   Sulfa Antibiotics Other (See Comments)    Hard to breathe   Crestor [Rosuvastatin] Other (See Comments)    Makes her mouth numb and causes side pain   Ibuprofen Other (See Comments)    Stomach upset   Polytrim [Polymyxin B-Trimethoprim] Itching and Swelling    redness   Influenza Vaccines Hives   Lipitor [Atorvastatin Calcium] Other (See Comments)    Muscle ache    Naproxen Nausea And Vomiting    Social History   Tobacco Use   Smoking status: Every Day    Packs/day: 0.25    Types: Cigarettes   Smokeless tobacco: Never   Tobacco comments:    4 cig a day, not interested in chantix  Substance Use Topics   Alcohol use: Yes    Comment: occasional    Family History  Problem Relation Age of Onset   Diabetes Mother    Cystic fibrosis Sister    Cancer Sister        ?   Heart disease Maternal Grandmother    Clotting disorder Maternal Grandfather       Review of Systems  Constitutional: negative for fatigue and weight loss Respiratory: negative for cough and wheezing Cardiovascular: negative for chest pain, fatigue and palpitations Gastrointestinal: negative for abdominal pain and change in bowel habits Musculoskeletal:negative for myalgias Neurological: negative for gait  problems and tremors Behavioral/Psych: negative for abusive relationship, depression Endocrine: negative for temperature intolerance    Genitourinary: positive for pelvic pain and vaginal discharge.  negative for abnormal menstrual periods, genital lesions, hot flashes, sexual problems  Integument/breast: negative for breast lump, breast tenderness, nipple discharge and skin lesion(s)    Objective:       BP 129/78    Pulse 61    Ht 5\' 7"  (1.702 m)    Wt 222 lb 9.6 oz (101 kg)    BMI 34.86 kg/m  General:   Alert and no distress  Skin:   no rash or abnormalities  Lungs:   clear to auscultation bilaterally  Heart:   regular rate and rhythm, S1, S2 normal, no murmur, click, rub or gallop  Breasts:   normal without suspicious masses, skin or nipple changes or axillary nodes  Abdomen:  normal findings: no organomegaly, soft, non-tender and no hernia  Pelvis:  External genitalia: normal general appearance Urinary system: urethral meatus normal and bladder without fullness, nontender Vaginal: normal without tenderness, induration or masses Cervix: normal appearance Adnexa: normal bimanual exam Uterus: anteverted and non-tender, normal size   Lab Review Urine pregnancy test Labs reviewed yes Radiologic studies reviewed yes  I have spent a total of 20 minutes of face-to-face time, excluding clinical staff time, reviewing notes and preparing to see patient, ordering tests and/or medications, and counseling the patient.   Assessment:    1. Encounter for gynecological examination with Papanicolaou smear of cervix Rx: - Cytology - PAP( [place])  2. Pelvic pain Rx: - US PELVIC COMPLETE WITH TRANSVAGINAL; Future - HYDROcodone-acetaminophen (NORCO) 5-325 MG tablet; 1-2 tabs po q6 hours prn pain  Dispense: 20 tablet; Refill: 0  3. Vaginal discharge Rx: - Cervicovaginal ancillary only( [place]) - fluconazole (DIFLUCAN) 150 MG tablet; Take 1 tablet (150 mg total) by mouth once for 1  dose.  Dispense: 1 tablet; Refill: 2  4. Screening for STD (sexually transmitted disease) Rx: - Hepatitis B surface antigen - Hepatitis C antibody - HIV Antibody (routine testing w rflx) - RPR  5. Tobacco dependence - cessation recommended  6. Obesity (BMI 30.0-34.9) - weight reduction recommended with the aid of dietary changes, exercise and behavioral modification recommended      Plan:    Education reviewed: calcium supplements, depression evaluation, low fat, low cholesterol diet, safe sex/STD prevention, self breast exams, smoking cessation, and weight bearing exercise. Follow up in: 1 year.    Orders Placed This Encounter  Procedures   Hepatitis B surface antigen   Hepatitis C antibody   HIV Antibody (routine testing w rflx)   RPR      Shelly Bombard, MD 12/03/2021 3:36 PM

## 2021-12-03 NOTE — Progress Notes (Signed)
Patient presents for AEX.  Last pap: 12/09/2018 Normal

## 2021-12-03 NOTE — Progress Notes (Signed)
I,Victoria T Hamilton,acting as a Education administrator for [name], FNP.,have documented all relevant documentation on the behalf of [name], FNP,as directed by  [name], FNP while in the presence of [name], [place].  This visit occurred during the SARS-CoV-2 public health emergency.  Safety protocols were in place, including screening questions prior to the visit, additional usage of staff PPE, and extensive cleaning of exam room while observing appropriate contact time as indicated for disinfecting solutions.  Subjective:     Patient ID: Margaret Webster , female    DOB: Feb 04, 1961 , 61 y.o.   MRN: [id number]   Chief Complaint  Patient presents with   PAD    HPI  Presents today after being seen by [facility] NP who performed a PAD test which was borderline. She reports having tingling sensation to bilateral lower extremities. She describes a tingling sensation radiating up to her right hip. Feels like when she walks she feels like she is walking on balls of thick skin. She will sometimes have cold sensation.    Wt Readings from Last 3 Encounters: 12/03/21 : 220 lb 9.6 oz (100.1 kg) 11/14/21 : 222 lb 9.6 oz (101 kg) 07/31/21 : 226 lb 9.6 oz (102.8 kg)      Past Medical History:  Diagnosis Date   Arthritis    bil shoulders   Bronchitis    Chronic back pain    Chronic kidney disease    Diabetes mellitus    stopped Mrtformin 1 mo ago, made her "sick"   Hypertension    Hypothyroidism    Stroke [facility])    Thyroid disease    Trigger thumb of right hand      Family History  Problem Relation Age of Onset   Diabetes Mother    Cystic fibrosis Sister    Cancer Sister        ?   Heart disease Maternal Grandmother    Clotting disorder Maternal Grandfather      Current Outpatient Medications:    amLODipine (NORVASC) 2.5 MG tablet, Take 1 tablet (2.5 mg total) by mouth daily., Disp: 90 tablet, Rfl: 0   aspirin EC 81 MG tablet, Take 81 mg by mouth daily., Disp: , Rfl:    fluticasone  (FLONASE) 50 MCG/ACT nasal spray, Place 2 sprays into both nostrils daily., Disp: 16 g, Rfl: 0   losartan (COZAAR) 25 MG tablet, Take 12.5 mg by mouth daily., Disp: , Rfl:    metFORMIN (GLUCOPHAGE-XR) 500 MG 24 hr tablet, Take 1 tablet by mouth once daily with breakfast, Disp: 90 tablet, Rfl: 0   NEXLETOL 180 MG TABS, TAKE 1 TABLET BY MOUTH AT BEDTIME, Disp: 90 tablet, Rfl: 0   triamcinolone cream (KENALOG) 0.5 %, Apply 1 application topically 2 (two) times daily., Disp: 30 g, Rfl: 0   vitamin C (ASCORBIC ACID) 500 MG tablet, Take 500 mg by mouth daily., Disp: , Rfl:    Vitamin D, Ergocalciferol, (DRISDOL) 1.25 MG (50000 UNIT) CAPS capsule, Take 1 capsule (50,000 Units total) by mouth every 7 (seven) days., Disp: 12 capsule, Rfl: 0   Benzocaine-Menthol (CEPACOL INSTAMAX) 15-20 MG LOZG, Use as directed 1 lozenge in the mouth or throat 3 (three) times daily as needed. (Patient not taking: Reported on 11/04/2021), Disp: 16 lozenge, Rfl: 0   benzonatate (TESSALON) 100 MG capsule, Take 1 capsule (100 mg total) by mouth 3 (three) times daily as needed for cough. (Patient not taking: Reported on 11/04/2021), Disp: 21 capsule, Rfl: 0  gabapentin (NEURONTIN) 100 MG capsule, Take 1 capsule (100 mg total) by mouth 3 (three) times daily as needed. (Patient not taking: Reported on 11/04/2021), Disp: 30 capsule, Rfl: 2   HYDROcodone-acetaminophen (NORCO) 5-325 MG tablet, 1-2 tabs po q6 hours prn pain (Patient not taking: Reported on 07/31/2021), Disp: 20 tablet, Rfl: 0   nystatin cream (MYCOSTATIN), Apply 1 application topically 2 (two) times daily. (Patient not taking: Reported on 11/04/2021), Disp: 30 g, Rfl: 0   nystatin-triamcinolone ointment (MYCOLOG), Apply 1 application topically 2 (two) times daily. (Patient not taking: Reported on 11/04/2021), Disp: 30 g, Rfl: 0   Allergies  Allergen Reactions   Sulfa Antibiotics Other (See Comments)    Hard to breathe   Crestor [Rosuvastatin] Other (See Comments)    Makes her  mouth numb and causes side pain   Ibuprofen Other (See Comments)    Stomach upset   Polytrim [Polymyxin B-Trimethoprim] Itching and Swelling    redness   Influenza Vaccines Hives   Lipitor [Atorvastatin Calcium] Other (See Comments)    Muscle ache    Naproxen Nausea And Vomiting     Review of Systems  Constitutional: Negative.   Respiratory: Negative.    Cardiovascular: Negative.   Gastrointestinal:  Positive for abdominal pain (right abdomen pain). Negative for constipation and vomiting.    Today's Vitals   12/03/21 0909  BP: 138/80  Pulse: 71  Temp: 98 F (36.7 C)  Weight: 220 lb 9.6 oz (100.1 kg)  Height: 5\' 7"  (1.702 m)   Body mass index is 34.55 kg/m.  Wt Readings from Last 3 Encounters:  12/03/21 220 lb 9.6 oz (100.1 kg)  11/14/21 222 lb 9.6 oz (101 kg)  07/31/21 226 lb 9.6 oz (102.8 kg)    Objective:  Physical Exam Vitals reviewed.  Constitutional:      General: She is not in acute distress.    Appearance: Normal appearance. She is well-developed. She is obese.  Neck:     Thyroid: No thyromegaly.     Trachea: No tracheal deviation.  Cardiovascular:     Rate and Rhythm: Normal rate and regular rhythm.     Pulses: Normal pulses.          Dorsalis pedis pulses are 2+ on the right side and 2+ on the left side.     Heart sounds: Normal heart sounds. No murmur heard. Pulmonary:     Effort: Pulmonary effort is normal. No respiratory distress.     Breath sounds: Normal breath sounds. No wheezing.  Musculoskeletal:     Cervical back: Normal range of motion and neck supple.     Right lower leg: No edema.     Left lower leg: No edema.  Skin:    General: Skin is warm and dry.  Neurological:     General: No focal deficit present.     Mental Status: She is alert and oriented to person, place, and time.     Cranial Nerves: No cranial nerve deficit.     Motor: No weakness.  Psychiatric:        Mood and Affect: Mood normal.        Behavior: Behavior normal.         Thought Content: Thought content normal.        Judgment: Judgment normal.        Assessment And Plan:     1. Abnormal ankle brachial index (ABI) Comments: PAD Screening done by [facility] NP showed borderline result, will refer to Vein  and Vascular.  - Ambulatory referral to Vascular Surgery  2. Right hip pain Comments: Pain has been ongoing, will check hip xray. She is able to ambulate at this time.  - DG Hip Unilat W OR W/O Pelvis 2-3 Views Right; Future  3. Constipation, unspecified constipation type Comments: She is encouraged to take benefiber daily to help and continue with increased water intake.   4. Class 1 obesity due to excess calories with serious comorbidity and body mass index (BMI) of 34.0 to 34.9 in adult  She is encouraged to strive for BMI less than 30 to decrease cardiac risk. Advised to aim for at least 150 minutes of exercise per week.    Patient was given opportunity to ask questions. Patient verbalized understanding of the plan and was able to repeat key elements of the plan. All questions were answered to their satisfaction.  [name], FNP   I, [name], FNP, have reviewed all documentation for this visit. The documentation on 12/03/21 for the exam, diagnosis, procedures, and orders are all accurate and complete.   IF YOU HAVE BEEN REFERRED TO A SPECIALIST, IT MAY TAKE 1-2 WEEKS TO SCHEDULE/PROCESS THE REFERRAL. IF YOU HAVE NOT HEARD FROM US/SPECIALIST IN TWO WEEKS, PLEASE GIVE [place] A CALL AT [phone].   THE PATIENT IS ENCOURAGED TO PRACTICE SOCIAL DISTANCING DUE TO THE COVID-19 PANDEMIC.

## 2021-12-04 ENCOUNTER — Encounter: Payer: Self-pay | Admitting: Internal Medicine

## 2021-12-04 ENCOUNTER — Ambulatory Visit (INDEPENDENT_AMBULATORY_CARE_PROVIDER_SITE_OTHER): Payer: Medicare Other | Admitting: Internal Medicine

## 2021-12-04 VITALS — BP 107/68 | HR 70 | Ht 67.0 in | Wt 223.0 lb

## 2021-12-04 DIAGNOSIS — N1831 Chronic kidney disease, stage 3a: Secondary | ICD-10-CM

## 2021-12-04 DIAGNOSIS — E1159 Type 2 diabetes mellitus with other circulatory complications: Secondary | ICD-10-CM | POA: Diagnosis not present

## 2021-12-04 DIAGNOSIS — I63312 Cerebral infarction due to thrombosis of left middle cerebral artery: Secondary | ICD-10-CM | POA: Diagnosis not present

## 2021-12-04 DIAGNOSIS — R001 Bradycardia, unspecified: Secondary | ICD-10-CM

## 2021-12-04 LAB — CERVICOVAGINAL ANCILLARY ONLY
Bacterial Vaginitis (gardnerella): NEGATIVE
Candida Glabrata: NEGATIVE
Candida Vaginitis: NEGATIVE
Chlamydia: NEGATIVE
Comment: NEGATIVE
Comment: NEGATIVE
Comment: NEGATIVE
Comment: NEGATIVE
Comment: NEGATIVE
Comment: NORMAL
Neisseria Gonorrhea: NEGATIVE
Trichomonas: NEGATIVE

## 2021-12-04 LAB — HEPATITIS C ANTIBODY: Hep C Virus Ab: <0.1 s/co ratio (ref 0.0–0.9)

## 2021-12-04 LAB — HEPATITIS B SURFACE ANTIGEN: Hepatitis B Surface Ag: NEGATIVE

## 2021-12-04 LAB — HIV ANTIBODY (ROUTINE TESTING W REFLEX): HIV Screen 4th Generation wRfx: NONREACTIVE

## 2021-12-04 LAB — RPR: RPR Ser Ql: NONREACTIVE

## 2021-12-04 MED ORDER — EZETIMIBE 10 MG PO TABS: 10.0000 mg | ORAL_TABLET | Freq: Every day | ORAL | 3 refills | Status: DC

## 2021-12-04 NOTE — Patient Instructions (Addendum)
Medication Instructions:  Your physician has recommended you make the following change in your medication:  START: ezetimibe (Zetia) 10 mg by mouth once daily around dinner time  *If you need a refill on your cardiac medications before your next appointment, please call your pharmacy*   Lab Work: IN 2 MONTHS: FLP, ALT (please fast nothing to eat or drink except water 8-12 hours prior)   If you have labs (blood work) drawn today and your tests are completely normal, you will receive your results only by: [place] (if you have MyChart) OR A paper copy in the mail If you have any lab test that is abnormal or we need to change your treatment, we will call you to review the results.   Testing/Procedures: Your physician has requested that you have an echocardiogram. Echocardiography is a painless test that uses sound waves to create images of your heart. It provides your doctor with information about the size and shape of your heart and how well your hearts chambers and valves are working. This procedure takes approximately one hour. There are no restrictions for this procedure.    Follow-Up: At [facility], you and your health needs are our priority.  As part of our continuing mission to provide you with exceptional heart care, we have created designated Provider Care Teams.  These Care Teams include your primary Cardiologist (physician) and Advanced Practice Providers (APPs -  Physician Assistants and Nurse Practitioners) who all work together to provide you with the care you need, when you need it.  We recommend signing up for the patient portal called "MyChart".  Sign up information is provided on this After Visit Summary.  MyChart is used to connect with patients for Virtual Visits (Telemedicine).  Patients are able to view lab/test results, encounter notes, upcoming appointments, etc.  Non-urgent messages can be sent to your provider as well.   To learn more about what you can do  with MyChart, go to [email or web address].    Your next appointment:   4 month(s)  The format for your next appointment:   In Person  Provider:   Werner Lean, MD

## 2021-12-05 LAB — CYTOLOGY - PAP
Comment: NEGATIVE
Diagnosis: NEGATIVE
High risk HPV: NEGATIVE

## 2021-12-06 NOTE — Addendum Note (Signed)
Addended by: Jeremy Johann on: 12/06/2021 12:48 PM   Modules accepted: Orders

## 2021-12-09 ENCOUNTER — Ambulatory Visit
Admission: RE | Admit: 2021-12-09 | Discharge: 2021-12-09 | Disposition: A | Discharge status: Home / Self Care | Payer: Medicare Other | Source: Ambulatory Visit | Attending: Obstetrics | Admitting: Obstetrics

## 2021-12-09 DIAGNOSIS — Z780 Asymptomatic menopausal state: Secondary | ICD-10-CM | POA: Diagnosis not present

## 2021-12-09 DIAGNOSIS — R102 Pelvic and perineal pain: Secondary | ICD-10-CM

## 2021-12-18 ENCOUNTER — Encounter: Payer: Self-pay | Admitting: Internal Medicine

## 2021-12-18 DIAGNOSIS — M549 Dorsalgia, unspecified: Secondary | ICD-10-CM | POA: Diagnosis not present

## 2021-12-18 DIAGNOSIS — I129 Hypertensive chronic kidney disease with stage 1 through stage 4 chronic kidney disease, or unspecified chronic kidney disease: Secondary | ICD-10-CM | POA: Diagnosis not present

## 2021-12-18 DIAGNOSIS — N1831 Chronic kidney disease, stage 3a: Secondary | ICD-10-CM | POA: Diagnosis not present

## 2021-12-18 DIAGNOSIS — Z720 Tobacco use: Secondary | ICD-10-CM | POA: Diagnosis not present

## 2021-12-18 DIAGNOSIS — E1122 Type 2 diabetes mellitus with diabetic chronic kidney disease: Secondary | ICD-10-CM | POA: Diagnosis not present

## 2021-12-18 DIAGNOSIS — G8929 Other chronic pain: Secondary | ICD-10-CM | POA: Diagnosis not present

## 2021-12-20 ENCOUNTER — Ambulatory Visit (HOSPITAL_COMMUNITY): Payer: Medicare Other | Attending: Cardiology

## 2021-12-20 ENCOUNTER — Telehealth: Payer: Self-pay | Admitting: Internal Medicine

## 2021-12-20 ENCOUNTER — Other Ambulatory Visit: Payer: Self-pay

## 2021-12-20 DIAGNOSIS — N1831 Chronic kidney disease, stage 3a: Secondary | ICD-10-CM | POA: Insufficient documentation

## 2021-12-20 DIAGNOSIS — R001 Bradycardia, unspecified: Secondary | ICD-10-CM | POA: Insufficient documentation

## 2021-12-20 DIAGNOSIS — I63312 Cerebral infarction due to thrombosis of left middle cerebral artery: Secondary | ICD-10-CM | POA: Diagnosis not present

## 2021-12-20 DIAGNOSIS — E1159 Type 2 diabetes mellitus with other circulatory complications: Secondary | ICD-10-CM | POA: Diagnosis not present

## 2021-12-20 LAB — ECHOCARDIOGRAM COMPLETE
AR max vel: 1.13 cm2
AV Area VTI: 1.17 cm2
AV Area mean vel: 1.15 cm2
AV Mean grad: 8.5 mmHg
AV Peak grad: 18.6 mmHg
Ao pk vel: 2.16 m/s
Area-P 1/2: 3.3 cm2
S' Lateral: 2.7 cm

## 2021-12-20 NOTE — Telephone Encounter (Signed)
Called pt informed that needs to take both Nexletol and Zetia.  Informed pt med is not metabolized by kidneys and that follow up labs will be drawn to monitor liver function.  Pt will take both meds no further questions.

## 2021-12-20 NOTE — Telephone Encounter (Signed)
Patient came in today after ECHO and wanted a message sent to Dr Gasper Sells about her medications. She was told that her Nexletol 180mg  was not listed in her chart.I did see this listed so I was confused but let her know it is listed. She also said that her kidney doctor told her that the Exetimibe 10mg  was the best cholesterol med to be on. She quit taking the Nexletol when she was prescribed the Exetimibe 10mg . Is it ok for her to take both or what does she need to do? Please call her [phone] if there are anymore questions.

## 2021-12-20 NOTE — Telephone Encounter (Signed)
Patient with very high LDL.  Needs to be on both Nexletol and Zetia.  It actually comes as a combination tablet called Nexlizet.  Neither are metabolized by the kidneys. She would actually benefit from Repatha/Praluent as well.

## 2021-12-23 ENCOUNTER — Telehealth: Payer: Self-pay

## 2021-12-24 ENCOUNTER — Other Ambulatory Visit: Payer: Self-pay

## 2021-12-24 DIAGNOSIS — I739 Peripheral vascular disease, unspecified: Secondary | ICD-10-CM

## 2021-12-25 ENCOUNTER — Encounter: Payer: Self-pay | Admitting: Vascular Surgery

## 2021-12-25 ENCOUNTER — Ambulatory Visit (HOSPITAL_COMMUNITY)
Admission: RE | Admit: 2021-12-25 | Discharge: 2021-12-25 | Disposition: A | Discharge status: Home / Self Care | Payer: Medicare Other | Source: Ambulatory Visit | Attending: Vascular Surgery | Admitting: Vascular Surgery

## 2021-12-25 ENCOUNTER — Other Ambulatory Visit: Payer: Self-pay

## 2021-12-25 ENCOUNTER — Ambulatory Visit (INDEPENDENT_AMBULATORY_CARE_PROVIDER_SITE_OTHER): Payer: Medicare Other | Admitting: Vascular Surgery

## 2021-12-25 VITALS — BP 137/82 | HR 88 | Temp 97.9°F | Resp 20 | Ht 67.0 in | Wt 220.0 lb

## 2021-12-25 DIAGNOSIS — M25571 Pain in right ankle and joints of right foot: Secondary | ICD-10-CM | POA: Diagnosis not present

## 2021-12-25 DIAGNOSIS — I739 Peripheral vascular disease, unspecified: Secondary | ICD-10-CM | POA: Diagnosis not present

## 2021-12-25 NOTE — Progress Notes (Signed)
Patient ID: Margaret Webster, female   DOB: July 21, 1961, 61 y.o.   MRN: [id number]  Reason for Consult: No chief complaint on file.   Referred by Glendale Chard, MD  Subjective:     HPI:  Margaret Webster is a 61 y.o. female with previous history of stroke leaving her with right lower extremity pain.  She is a current everyday smoker.  She also has diabetes mellitus which is controlled.  She also has hypertension as a risk factor.  She takes aspirin no statins due to allergy but she is on Zetia.  She was having pain in the right lower extremity underwent ABI which was abnormal by [place] [facility].  She is now here to discuss and have formal ABIs performed.  She does not have any tissue loss or ulceration.  She denies any previous injuries to her right lower extremity.  She denies any issues with left lower extremity.  Past Medical History:  Diagnosis Date   Arthritis    bil shoulders   Bronchitis    Chronic back pain    Chronic kidney disease    Diabetes mellitus    stopped Mrtformin 1 mo ago, made her "sick"   Fatty liver    Hypertension    Hypothyroidism    Stroke [facility])    Thyroid disease    Trigger thumb of right hand    Family History  Problem Relation Age of Onset   Diabetes Mother    Cystic fibrosis Sister    Cancer Sister        ?   Heart disease Maternal Grandmother    Clotting disorder Maternal Grandfather    Past Surgical History:  Procedure Laterality Date   BACK SURGERY  1993   CARPAL TUNNEL RELEASE  08/2015   CARPAL TUNNEL RELEASE Left 09/10/2017   Procedure: CARPAL TUNNEL RELEASE;  Surgeon: Leanora Cover, MD;  Location: [place];  Service: Orthopedics;  Laterality: Left;   CERVICAL SPINE SURGERY Right 11/2012   OOPHORECTOMY     TRIGGER FINGER RELEASE Right 08/06/2017   Procedure: RIGHT THUMB TRIGGER RELEASE;  Surgeon: Leanora Cover, MD;  Location: [place];  Service: Orthopedics;  Laterality: Right;   ULNAR COLLATERAL  LIGAMENT REPAIR Right 03/21/2021   Procedure: RIGHT THUMB ULNAR COLLATERAL LIGAMENT REPAIR;  Surgeon: Leanora Cover, MD;  Location: [place];  Service: Orthopedics;  Laterality: Right;    Short Social History:  Social History   Tobacco Use   Smoking status: Every Day    Packs/day: 0.25    Types: Cigarettes   Smokeless tobacco: Never   Tobacco comments:    4 cig a day, not interested in chantix  Substance Use Topics   Alcohol use: Yes    Comment: occasional    Allergies  Allergen Reactions   Sulfa Antibiotics Other (See Comments)    Hard to breathe   Crestor [Rosuvastatin] Other (See Comments)    Makes her mouth numb and causes side pain   Ibuprofen Other (See Comments)    Stomach upset   Polytrim [Polymyxin B-Trimethoprim] Itching and Swelling    redness   Influenza Vaccines Hives   Lipitor [Atorvastatin Calcium] Other (See Comments)    Muscle ache    Naproxen Nausea And Vomiting    Current Outpatient Medications  Medication Sig Dispense Refill   amLODipine (NORVASC) 2.5 MG tablet Take 1 tablet (2.5 mg total) by mouth daily. 90 tablet 0   aspirin EC 81 MG tablet  Take 81 mg by mouth daily.     ezetimibe (ZETIA) 10 MG tablet Take 1 tablet (10 mg total) by mouth daily. 90 tablet 3   gabapentin (NEURONTIN) 100 MG capsule Take 1 capsule (100 mg total) by mouth 3 (three) times daily as needed. 30 capsule 2   HYDROcodone-acetaminophen (NORCO) 5-325 MG tablet 1-2 tabs po q6 hours prn pain 20 tablet 0   losartan (COZAAR) 25 MG tablet Take 12.5 mg by mouth daily.     metFORMIN (GLUCOPHAGE-XR) 500 MG 24 hr tablet Take 1 tablet by mouth once daily with breakfast 90 tablet 0   NEXLETOL 180 MG TABS TAKE 1 TABLET BY MOUTH AT BEDTIME 90 tablet 0   nystatin cream (MYCOSTATIN) Apply 1 application topically 2 (two) times daily. 30 g 0   nystatin-triamcinolone ointment (MYCOLOG) Apply 1 application topically 2 (two) times daily. 30 g 0   triamcinolone cream (KENALOG) 0.5 %  Apply 1 application topically 2 (two) times daily. 30 g 0   vitamin C (ASCORBIC ACID) 500 MG tablet Take 500 mg by mouth daily.     Vitamin D, Ergocalciferol, (DRISDOL) 1.25 MG (50000 UNIT) CAPS capsule Take 1 capsule (50,000 Units total) by mouth every 7 (seven) days. 12 capsule 0   No current facility-administered medications for this visit.    Review of Systems  Constitutional:  Constitutional negative. HENT: HENT negative.  Eyes: Eyes negative.  Respiratory: Respiratory negative.  Cardiovascular: Cardiovascular negative.  GI: Gastrointestinal negative.  Musculoskeletal:       Right foot pain Skin: Skin negative.  Neurological: Positive for focal weakness.  Hematologic: Hematologic/lymphatic negative.  Psychiatric: Psychiatric negative.       Objective:  Objective  Vitals:   12/25/21 1032  BP: 137/82  Pulse: 88  Resp: 20  Temp: 97.9 F (36.6 C)  SpO2: 92%     Physical Exam HENT:     Head: Normocephalic.     Nose:     Comments: Wearing a mask Eyes:     Pupils: Pupils are equal, round, and reactive to light.  Neck:     Vascular: No carotid bruit.  Cardiovascular:     Rate and Rhythm: Normal rate.     Pulses:          Radial pulses are 2+ on the right side and 2+ on the left side.       Dorsalis pedis pulses are 2+ on the right side and 2+ on the left side.     Heart sounds: Murmur heard.  Pulmonary:     Effort: Pulmonary effort is normal.  Abdominal:     General: Abdomen is flat.     Palpations: Abdomen is soft.  Musculoskeletal:        General: Normal range of motion.     Cervical back: Normal range of motion.     Right lower leg: No edema.     Left lower leg: No edema.  Skin:    General: Skin is warm and dry.     Capillary Refill: Capillary refill takes less than 2 seconds.  Neurological:     General: No focal deficit present.     Mental Status: She is alert.  Psychiatric:        Mood and Affect: Mood normal.        Behavior: Behavior normal.         Thought Content: Thought content normal.    Data: ABI Findings:  +---------+------------------+-----+---------+--------+   Right  Rt Pressure (mmHg) Index Waveform  Comment    +---------+------------------+-----+---------+--------+   Brachial  154                                           +---------+------------------+-----+---------+--------+   PTA       175                1.11  triphasic            +---------+------------------+-----+---------+--------+   DP        173                1.09  triphasic            +---------+------------------+-----+---------+--------+   Great Toe 126                0.80                       +---------+------------------+-----+---------+--------+   +---------+------------------+-----+---------+-------+   Left      Lt Pressure (mmHg) Index Waveform  Comment   +---------+------------------+-----+---------+-------+   Brachial  158                                          +---------+------------------+-----+---------+-------+   PTA       167                1.06  triphasic           +---------+------------------+-----+---------+-------+   DP        192                1.22  triphasic           +---------+------------------+-----+---------+-------+   Great Toe 134                0.85                      +---------+------------------+-----+---------+-------+   +-------+-----------+-----------+------------+------------+   ABI/TBI Today's ABI Today's TBI Previous ABI Previous TBI   +-------+-----------+-----------+------------+------------+   Right   1.11        0.80                                    +-------+-----------+-----------+------------+------------+   Left    1.22        0.85                                    +-------+-----------+-----------+------------+------------+   Summary:  Right: Resting right ankle-brachial index is within normal range. No  evidence of significant right lower extremity arterial disease. The right   toe-brachial index is normal.   Left: Resting left ankle-brachial index is within normal range. No  evidence of significant left lower extremity arterial disease. The left  toe-brachial index is normal.       Assessment/Plan:    60 year old female with right lower extremity pain particularly right foot pain after previous stroke.  She does not know the cause of the stroke she does take aspirin has a statin intolerance and is on Zetia.  ABIs are  normal and pulses are palpable.  No identifiable bruits on exam today.  She can follow-up with me on as-needed basis.     Waynetta Sandy MD [facility]

## 2021-12-27 DIAGNOSIS — N1831 Chronic kidney disease, stage 3a: Secondary | ICD-10-CM | POA: Diagnosis not present

## 2021-12-31 ENCOUNTER — Other Ambulatory Visit: Payer: Self-pay | Admitting: Nurse Practitioner

## 2021-12-31 DIAGNOSIS — R7303 Prediabetes: Secondary | ICD-10-CM

## 2022-01-17 ENCOUNTER — Telehealth: Payer: Medicare Other

## 2022-01-21 ENCOUNTER — Other Ambulatory Visit: Payer: Self-pay

## 2022-01-21 ENCOUNTER — Ambulatory Visit (INDEPENDENT_AMBULATORY_CARE_PROVIDER_SITE_OTHER): Payer: Medicare Other | Admitting: Internal Medicine

## 2022-01-21 ENCOUNTER — Encounter (HOSPITAL_BASED_OUTPATIENT_CLINIC_OR_DEPARTMENT_OTHER): Payer: Self-pay | Admitting: Internal Medicine

## 2022-01-21 VITALS — BP 150/78 | HR 61 | Ht 67.0 in | Wt 223.0 lb

## 2022-01-21 DIAGNOSIS — I739 Peripheral vascular disease, unspecified: Secondary | ICD-10-CM | POA: Diagnosis not present

## 2022-01-21 DIAGNOSIS — E1159 Type 2 diabetes mellitus with other circulatory complications: Secondary | ICD-10-CM | POA: Diagnosis not present

## 2022-01-21 DIAGNOSIS — I63312 Cerebral infarction due to thrombosis of left middle cerebral artery: Secondary | ICD-10-CM

## 2022-01-21 DIAGNOSIS — E785 Hyperlipidemia, unspecified: Secondary | ICD-10-CM | POA: Diagnosis not present

## 2022-01-21 MED ORDER — REPATHA SURECLICK 140 MG/ML ~~LOC~~ SOAJ: 1.0000 | SUBCUTANEOUS | 11 refills | Status: DC

## 2022-01-21 NOTE — Patient Instructions (Signed)
Medication Instructions:  ?Dr. Debara Pickett recommends Repatha '140mg'$ /ml or Praluent '150mg'$ /mL (PCSK9). This is an injectable cholesterol medication self-administered once every 14 days. This medication will likely need prior approval with your insurance company, which we will work on. If the medication is not approved initially, we may need to do an appeal with your insurance.  ? ?Administer medication in area of fatty tissue such as abdomen, outer thigh, back of upper arm - and rotate site with each injection ?Store medication in refrigerator until ready to administer - allow to sit at room temp for 30 mins - 1 hour prior to injection ?Dispose of medication in a SHARPS container - your pharmacy should be able to direct you on this and proper disposal  ? ? ?Patient Assistance:   ?The PAN Foundation: https://www.panfoundation.org/disease-funds/hypercholesterolemia/ ?-- can sign up for wait list ? ?The [employer] foundation offers assistance to help pay for medication copays.  They will cover copays for all cholesterol lowering meds, including statins, fibrates, omega-3 fish oils like Vascepa, ezetimibe, Repatha, Praluent, Nexletol, Nexlizet.  The cards are usually good for $2,500 or 12 months, whichever comes first. ?Go to healthwellfoundation.org ?Click on ?Apply Now? ?Answer questions as to whom is applying (patient or representative) ?Your disease fund will be ?hypercholesterolemia - Medicare access? ?They will ask questions about finances and which medications you are taking for cholesterol ?When you submit, the approval is usually within minutes.  You will need to print the card information from the site ?You will need to show this information to your pharmacy, they will bill your Medicare Part D plan first -then bill [employer] Well --for the copay.   ?You can also call them at [phone], although the hold times can be quite long.  ? ? ? ?*If you need a refill on your cardiac medications before your next appointment,  please call your pharmacy* ? ? ?Lab Work: ?FASTING lab work today --- NMR Lipoprofile, Lipoprotein A ? ?FASTING lab work to check cholesterol before your next visit  ? ?If you have labs (blood work) drawn today and your tests are completely normal, you will receive your results only by: ?MyChart Message (if you have MyChart) OR ?A paper copy in the mail ?If you have any lab test that is abnormal or we need to change your treatment, we will call you to review the results. ? ? ?Follow-Up: ?At [facility] you and your health needs are our priority.  As part of our continuing mission to provide you with exceptional heart care, we have created designated Provider Care Teams.  These Care Teams include your primary Cardiologist (physician) and Advanced Practice Providers (APPs -  Physician Assistants and Nurse Practitioners) who all work together to provide you with the care you need, when you need it. ? ?We recommend signing up for the patient portal called "MyChart".  Sign up information is provided on this After Visit Summary.  MyChart is used to connect with patients for Virtual Visits (Telemedicine).  Patients are able to view lab/test results, encounter notes, upcoming appointments, etc.  Non-urgent messages can be sent to your provider as well.   ?To learn more about what you can do with MyChart, go to [email or web address].   ? ?Your next appointment:   ? ?4 months with Dr. Debara Pickett ? ?

## 2022-01-22 LAB — NMR, LIPOPROFILE
Cholesterol, Total: 190 mg/dL (ref 100–199)
HDL Particle Number: 37.1 umol/L (ref >=30.5)
HDL-C: 47 mg/dL (ref >39)
LDL Particle Number: 1403 nmol/L — ABNORMAL HIGH (ref <1000)
LDL Size: 20.8 nm (ref >20.5)
LDL-C (NIH Calc): 113 mg/dL — ABNORMAL HIGH (ref 0–99)
LP-IR Score: 61 — ABNORMAL HIGH (ref <=45)
Small LDL Particle Number: 707 nmol/L — ABNORMAL HIGH (ref <=527)
Triglycerides: 173 mg/dL — ABNORMAL HIGH (ref 0–149)

## 2022-01-22 LAB — LIPOPROTEIN A (LPA): Lipoprotein (a): 145.2 nmol/L — ABNORMAL HIGH (ref <75.0)

## 2022-01-23 ENCOUNTER — Telehealth: Payer: Self-pay | Admitting: Internal Medicine

## 2022-01-23 NOTE — Telephone Encounter (Signed)
Patient called w/lab results and update that repatha is approved. She was advised to call [facility] to get this filled ?

## 2022-01-23 NOTE — Telephone Encounter (Signed)
Request Reference Number: [id number]. REPATHA SURE INJ '140MG'$ /ML is approved through 07/26/2022 ?

## 2022-01-23 NOTE — Telephone Encounter (Signed)
Left message to call back to discuss med approval & lab results ? ? Pixie Casino, MD  ?01/22/2022  5:08 PM EDT   ?  ?LDL-P and LDL-C remain above target. Trigs mildly elevated ?  ?Dr Lemmie Evens  ? ?

## 2022-01-23 NOTE — Telephone Encounter (Signed)
PA for repatha submitted via CMM ?(Key: [id number]) ?

## 2022-01-24 NOTE — Progress Notes (Signed)
? ? ?LIPID CLINIC CONSULT NOTE ? ?Chief Complaint:  ?Manage dyslipidemia ? ?Primary Care Physician: ?Margaret Brine, FNP ? ?Primary Cardiologist:  ?Margaret Lean, MD ? ?HPI:  ?Margaret Webster is a 61 y.o. female who is being seen today for the evaluation of dyslipidemia at the request of Margaret Castilla, NP.  This is a pleasant 61 year old female kindly referred for evaluation management of dyslipidemia. She has been seen recently by Dr. Gasper Webster for bradycardia.  She has a notable history of prior stroke, diabetes, hypertension, chronic kidney disease and other medical problems and he recommended her starting on ezetimibe because of prior statin intolerance including issues with rosuvastatin and atorvastatin both causing myalgias.  Her most recent lipid profile in January 2023 showed total cholesterol 329, HDL 52, triglycerides 177 and LDL 242.  There is remote history of family heart disease but no clear high cholesterol or family heart disease in her parents. ? ?PMHx:  ?Past Medical History:  ?Diagnosis Date  ? Arthritis   ? bil shoulders  ? Bronchitis   ? Chronic back pain   ? Chronic kidney disease   ? Diabetes mellitus   ? stopped Mrtformin 1 mo ago, made her "sick"  ? Fatty liver   ? Hypertension   ? Hypothyroidism   ? Stroke [facility])   ? Thyroid disease   ? Trigger thumb of right hand   ? ? ?Past Surgical History:  ?Procedure Laterality Date  ? [place]  ? CARPAL TUNNEL RELEASE  08/2015  ? CARPAL TUNNEL RELEASE Left 09/10/2017  ? Procedure: CARPAL TUNNEL RELEASE;  Surgeon: Margaret Cover, MD;  Location: [place];  Service: Orthopedics;  Laterality: Left;  ? CERVICAL SPINE SURGERY Right 11/2012  ? OOPHORECTOMY    ? TRIGGER FINGER RELEASE Right 08/06/2017  ? Procedure: RIGHT THUMB TRIGGER RELEASE;  Surgeon: Margaret Cover, MD;  Location: [place];  Service: Orthopedics;  Laterality: Right;  ? ULNAR COLLATERAL LIGAMENT REPAIR Right 03/21/2021  ? Procedure:  RIGHT THUMB ULNAR COLLATERAL LIGAMENT REPAIR;  Surgeon: Margaret Cover, MD;  Location: [place];  Service: Orthopedics;  Laterality: Right;  ? ? ?FAMHx:  ?Family History  ?Problem Relation Age of Onset  ? Diabetes Mother   ? Cystic fibrosis Sister   ? Cancer Sister   ?     ?  ? Heart disease Maternal Grandmother   ? Clotting disorder Maternal Grandfather   ? ? ?SOCHx:  ? reports that she has been smoking cigarettes. She has been smoking an average of .25 packs per day. She has never used smokeless tobacco. She reports current alcohol use. She reports current drug use. Drug: Marijuana. ? ?ALLERGIES:  ?Allergies  ?Allergen Reactions  ? Sulfa Antibiotics Other (See Comments)  ?  Hard to breathe  ? Crestor [Rosuvastatin] Other (See Comments)  ?  Makes her mouth numb and causes side pain  ? Ibuprofen Other (See Comments)  ?  Stomach upset  ? Polytrim [Polymyxin B-Trimethoprim] Itching and Swelling  ?  redness  ? Influenza Vaccines Hives  ? Lipitor [Atorvastatin Calcium] Other (See Comments)  ?  Muscle ache ?  ? Naproxen Nausea And Vomiting  ? ? ?ROS: ?Pertinent items noted in HPI and remainder of comprehensive ROS otherwise negative. ? ?HOME MEDS: ?Current Outpatient Medications on File Prior to Visit  ?Medication Sig Dispense Refill  ? amLODipine (NORVASC) 2.5 MG tablet TAKE 1 TABLET BY MOUTH ONCE  DAILY 90 tablet 3  ? aspirin  EC 81 MG tablet Take 81 mg by mouth daily.    ? ezetimibe (ZETIA) 10 MG tablet Take 1 tablet (10 mg total) by mouth daily. 90 tablet 3  ? gabapentin (NEURONTIN) 100 MG capsule Take 1 capsule (100 mg total) by mouth 3 (three) times daily as needed. 30 capsule 2  ? HYDROcodone-acetaminophen (NORCO) 5-325 MG tablet 1-2 tabs po q6 hours prn pain 20 tablet 0  ? losartan (COZAAR) 25 MG tablet Take 12.5 mg by mouth daily.    ? metFORMIN (GLUCOPHAGE-XR) 500 MG 24 hr tablet TAKE 1 TABLET BY MOUTH ONCE  DAILY WITH BREAKFAST 90 tablet 3  ? NEXLETOL 180 MG TABS TAKE 1 TABLET BY MOUTH AT BEDTIME  90 tablet 0  ? nystatin cream (MYCOSTATIN) Apply 1 application topically 2 (two) times daily. 30 g 0  ? nystatin-triamcinolone ointment (MYCOLOG) Apply 1 application topically 2 (two) times daily. 30 g 0  ? triamcinolone cream (KENALOG) 0.5 % Apply 1 application topically 2 (two) times daily. 30 g 0  ? vitamin C (ASCORBIC ACID) 500 MG tablet Take 500 mg by mouth daily.    ? Vitamin D, Ergocalciferol, (DRISDOL) 1.25 MG (50000 UNIT) CAPS capsule Take 1 capsule (50,000 Units total) by mouth every 7 (seven) days. 12 capsule 0  ? ?No current facility-administered medications on file prior to visit.  ? ? ?LABS/IMAGING: ?No results found for this or any previous visit (from the past 48 hour(s)). ?No results found. ? ?LIPID PANEL: ?   ?Component Value Date/Time  ? CHOL 329 (H) 11/14/2021 1027  ? TRIG 177 (H) 11/14/2021 1027  ? HDL 52 11/14/2021 1027  ? CHOLHDL 6.3 (H) 11/14/2021 1027  ? CHOLHDL 7.1 02/27/2020 0234  ? VLDL 37 02/27/2020 0234  ? LDLCALC 242 (H) 11/14/2021 1027  ? ? ?WEIGHTS: ?Wt Readings from Last 3 Encounters:  ?01/21/22 223 lb (101.2 kg)  ?12/25/21 220 lb (99.8 kg)  ?12/04/21 223 lb (101.2 kg)  ? ? ?VITALS: ?BP (!) 150/78   Pulse 61   Ht '5\' 7"'$  (1.702 m)   Wt 223 lb (101.2 kg)   SpO2 99%   BMI 34.93 kg/m?  ? ?EXAM: ?General appearance: alert and no distress ?Neck: no carotid bruit, no JVD, and thyroid not enlarged, symmetric, no tenderness/mass/nodules ?Lungs: clear to auscultation bilaterally ?Heart: regular rate and rhythm, S1, S2 normal, no murmur, click, rub or gallop ?Abdomen: soft, non-tender; bowel sounds normal; no masses,  no organomegaly ?Extremities: extremities normal, atraumatic, no cyanosis or edema ?Pulses: 2+ and symmetric ?Skin: Skin color, texture, turgor normal. No rashes or lesions ?Neurologic: Grossly normal ?Psych: Pleasant ? ?EKG: ?Deferred ? ?ASSESSMENT: ?Mixed dyslipidemia with LDL greater than 190,?  Familial hyperlipidemia ?History of stroke ?Type 2  diabetes ?Hypertension ?Statin intolerance-myalgias ? ?PLAN: ?1.   Margaret Webster has a mixed dyslipidemia with a very high LDL cholesterol.  This could be a familial hyperlipidemia although there is not a clear genetic inheritance pattern.  She has had prior stroke and her target LDL is than 55 if achievable.  Unfortunately she could not tolerate statins.  She was recently started on ezetimibe however at this point we have not had enough time to see the reduction in her cholesterol.  She will unlikely however reach target on this dose.  Additional therapy will be warranted and would recommend a PCSK9 inhibitor.  We will reach out for prior authorization for that.  Plan follow-up with me in about 3 to 4 months with a lipid NMR and  LP(a). ? ?Thanks again for the kind referral. ? ?Pixie Casino, MD, [facility], FACP  ?Combine  ?Medical Director of the Advanced Lipid Disorders &  ?Cardiovascular Risk Reduction Clinic ?Diplomate of the [employer] of Clinical Lipidology ?Attending Cardiologist  ?Direct Dial: [phone]  Fax: [phone]  ?Website:  [email or web address] ? ?Nadean Corwin Lacretia Tindall ?01/24/2022, 5:36 PM  ?

## 2022-01-27 ENCOUNTER — Telehealth: Payer: Self-pay | Admitting: Internal Medicine

## 2022-01-27 NOTE — Telephone Encounter (Signed)
Patient reports she does not believe she got her full dose of Repatha this morning. Also, she reports a tightness across her abdomen after the injection (her first one). Should patient be concerned? ?

## 2022-01-27 NOTE — Telephone Encounter (Signed)
Patient will come on 4/6 for education on repatha sureclick injection. ?

## 2022-01-27 NOTE — Telephone Encounter (Signed)
Pt c/o medication issue: ? ?1. Name of Medication: Evolocumab (REPATHA SURECLICK) 840 MG/ML SOAJ ? ?2. How are you currently taking this medication (dosage and times per day)? 1 injection every 14 days ? ?3. Are you having a reaction (difficulty breathing--STAT)? no ? ?4. What is your medication issue? Patient states she moved the pen when doing her first shot and does not think she got all of it.  ?

## 2022-01-27 NOTE — Telephone Encounter (Signed)
I do not think either is a big concern (except hat the full effect of Repatha on lowering the cholesterol will not occur with this dose). Should we have her meet with our clinical pharmacist to go over the administration technique again. ?

## 2022-01-30 ENCOUNTER — Ambulatory Visit: Payer: Medicare Other

## 2022-02-04 ENCOUNTER — Other Ambulatory Visit: Payer: Medicare Other

## 2022-02-05 ENCOUNTER — Other Ambulatory Visit: Payer: Medicare Other | Admitting: *Deleted

## 2022-02-05 DIAGNOSIS — I63312 Cerebral infarction due to thrombosis of left middle cerebral artery: Secondary | ICD-10-CM

## 2022-02-05 DIAGNOSIS — R001 Bradycardia, unspecified: Secondary | ICD-10-CM | POA: Diagnosis not present

## 2022-02-05 DIAGNOSIS — N1831 Chronic kidney disease, stage 3a: Secondary | ICD-10-CM

## 2022-02-05 DIAGNOSIS — E1159 Type 2 diabetes mellitus with other circulatory complications: Secondary | ICD-10-CM

## 2022-02-05 LAB — LIPID PANEL
Chol/HDL Ratio: 3.5 ratio (ref 0.0–4.4)
Cholesterol, Total: 153 mg/dL (ref 100–199)
HDL: 44 mg/dL (ref >39)
LDL Chol Calc (NIH): 82 mg/dL (ref 0–99)
Triglycerides: 159 mg/dL — ABNORMAL HIGH (ref 0–149)
VLDL Cholesterol Cal: 27 mg/dL (ref 5–40)

## 2022-02-05 LAB — ALT: ALT: 15 IU/L (ref 0–32)

## 2022-02-07 ENCOUNTER — Telehealth: Payer: Medicare Other

## 2022-02-12 ENCOUNTER — Telehealth: Payer: Self-pay | Admitting: Internal Medicine

## 2022-02-12 NOTE — Telephone Encounter (Signed)
° °  Pt is returning call to get lab result °

## 2022-02-12 NOTE — Telephone Encounter (Signed)
Called pt in regards to lab results.  Advised pt that results were reviewed today.  Pt reports  responded to one of the messages I left on voicemail.  Pt has no needs at this time.  ?

## 2022-02-26 ENCOUNTER — Telehealth: Payer: Medicare Other

## 2022-02-26 ENCOUNTER — Ambulatory Visit (INDEPENDENT_AMBULATORY_CARE_PROVIDER_SITE_OTHER): Payer: Medicare Other

## 2022-02-26 DIAGNOSIS — I10 Essential (primary) hypertension: Secondary | ICD-10-CM

## 2022-02-26 DIAGNOSIS — N1831 Chronic kidney disease, stage 3a: Secondary | ICD-10-CM

## 2022-02-26 DIAGNOSIS — E559 Vitamin D deficiency, unspecified: Secondary | ICD-10-CM

## 2022-02-26 DIAGNOSIS — E782 Mixed hyperlipidemia: Secondary | ICD-10-CM

## 2022-02-26 DIAGNOSIS — R7303 Prediabetes: Secondary | ICD-10-CM

## 2022-02-26 DIAGNOSIS — M1991 Primary osteoarthritis, unspecified site: Secondary | ICD-10-CM

## 2022-02-26 NOTE — Patient Instructions (Signed)
Visit Information ? ?Thank you for taking time to visit with me today. Please don't hesitate to contact me if I can be of assistance to you before our next scheduled telephone appointment. ? ?Following are the goals we discussed today:  ?(Copy and paste patient goals from clinical care plan here) ? ?Our next appointment is by telephone on 05/12/22 at 10:30 AM  ? ?Please call the care guide team at [phone] if you need to cancel or reschedule your appointment.  ? ?If you are experiencing a Mental Health or [place] or need someone to talk to, please call [phone]-TALK (toll free, 24 hour hotline)  ? ?Patient verbalizes understanding of instructions and care plan provided today and agrees to view in [place]. Active MyChart status confirmed with patient.   ? ?Barb Merino, RN, BSN, CCM ?Care Management Coordinator ?[facility]  ?Direct Phone: [phone] ? ? ?

## 2022-02-26 NOTE — Chronic Care Management (AMB) (Signed)
?Chronic Care Management  ? ?CCM RN Visit Note ? ?02/26/2022 ?Name: Margaret Webster MRN: [id number] DOB: 21-May-1961 ? ?Subjective: ?Margaret Webster is a 61 y.o. year old female who is a primary care patient of [name], [place]. The care management team was consulted for assistance with disease management and care coordination needs.   ? ?Engaged with patient by telephone for follow up visit in response to provider referral for case management and/or care coordination services.  ? ?Consent to Services:  ?The patient was given information about Chronic Care Management services, agreed to services, and gave verbal consent prior to initiation of services.  Please see initial visit note for detailed documentation.  ? ?Patient agreed to services and verbal consent obtained.  ? ?Assessment: Review of patient past medical history, allergies, medications, health status, including review of consultants reports, laboratory and other test data, was performed as part of comprehensive evaluation and provision of chronic care management services.  ? ?SDOH (Social Determinants of Health) assessments and interventions performed:  Yes, assistance needed to locate [employer] near home, sent in basket message to embedded BSW  ? ?CCM Care Plan ? ?Allergies  ?Allergen Reactions  ? Sulfa Antibiotics Other (See Comments)  ?  Hard to breathe  ? Crestor [Rosuvastatin] Other (See Comments)  ?  Makes her mouth numb and causes side pain  ? Ibuprofen Other (See Comments)  ?  Stomach upset  ? Polytrim [Polymyxin B-Trimethoprim] Itching and Swelling  ?  redness  ? Influenza Vaccines Hives  ? Lipitor [Atorvastatin Calcium] Other (See Comments)  ?  Muscle ache ?  ? Naproxen Nausea And Vomiting  ? ? ?Outpatient Encounter Medications as of 02/26/2022  ?Medication Sig  ? Evolocumab (REPATHA SURECLICK) 470 MG/ML SOAJ Inject 1 Dose into the skin every 14 (fourteen) days.  ? ezetimibe (ZETIA) 10 MG tablet Take 1 tablet (10 mg total) by mouth daily.  ?  NEXLETOL 180 MG TABS TAKE 1 TABLET BY MOUTH AT BEDTIME  ? amLODipine (NORVASC) 2.5 MG tablet TAKE 1 TABLET BY MOUTH ONCE  DAILY  ? aspirin EC 81 MG tablet Take 81 mg by mouth daily.  ? gabapentin (NEURONTIN) 100 MG capsule Take 1 capsule (100 mg total) by mouth 3 (three) times daily as needed. (Patient not taking: Reported on 02/26/2022)  ? HYDROcodone-acetaminophen (NORCO) 5-325 MG tablet 1-2 tabs po q6 hours prn pain  ? losartan (COZAAR) 25 MG tablet Take 12.5 mg by mouth daily.  ? metFORMIN (GLUCOPHAGE-XR) 500 MG 24 hr tablet TAKE 1 TABLET BY MOUTH ONCE  DAILY WITH BREAKFAST  ? nystatin cream (MYCOSTATIN) Apply 1 application topically 2 (two) times daily.  ? nystatin-triamcinolone ointment (MYCOLOG) Apply 1 application topically 2 (two) times daily.  ? triamcinolone cream (KENALOG) 0.5 % Apply 1 application topically 2 (two) times daily.  ? vitamin C (ASCORBIC ACID) 500 MG tablet Take 500 mg by mouth daily.  ? Vitamin D, Ergocalciferol, (DRISDOL) 1.25 MG (50000 UNIT) CAPS capsule Take 1 capsule (50,000 Units total) by mouth every 7 (seven) days.  ? ?No facility-administered encounter medications on file as of 02/26/2022.  ? ? ?Patient Active Problem List  ? Diagnosis Date Noted  ? Depression 07/05/2020  ? Ringing in ears 07/05/2020  ? CVA (cerebral vascular accident) ([place]) 02/26/2020  ? CKD (chronic kidney disease) stage 3, GFR 30-59 ml/min (HCC)   ? Bradycardia   ? Elevated TSH   ? Multiple joint pain 11/07/2019  ? Acute pain of right shoulder 09/19/2019  ?  Right hip pain 09/19/2019  ? Prediabetes 09/08/2018  ? Mass of joint of finger 07/26/2015  ? Chest pain 01/10/2015  ? DM (diabetes mellitus) ([place]) 01/10/2015  ? Hypertension 01/10/2015  ? Hypothyroidism 01/10/2015  ? Headache 01/10/2015  ? Abdominal pain 01/10/2015  ? Vaginitis 04/07/2014  ? Pain 04/07/2014  ? Vaginal irritation 04/07/2014  ? Boil of buttock 04/06/2014  ? BV (bacterial vaginosis) 04/06/2014  ? Unspecified symptom associated with female genital  organs 11/16/2013  ? Candidiasis of vulva and vagina 11/16/2013  ? Vaginitis and vulvovaginitis, unspecified 06/07/2013  ? Pain aggravated by activities of daily living 06/07/2013  ? S/P cervical spinal fusion 01/17/2013  ? Cervical radiculopathy 12/13/2012  ? Carpal tunnel syndrome on both sides 12/06/2012  ? Cervical spondylosis with myelopathy 12/06/2012  ? Nonspecific abnormal finding in stool contents 12/02/2011  ? Diabetes mellitus without mention of complication 09/73/5329  ? Chronic back pain   ? ? ?Conditions to be addressed/monitored: Essential hypertension, Stage 3a chronic kidney disease, Mixed hyperlipidemia, Prediabetes, Primary Osteoarthritis, Vitamin D deficiency ? ?Care Plan : RN Care Manager Plan of Care  ?Updates made by Lynne Logan, RN since 02/26/2022 12:00 AM  ?  ? ?Problem: No Plan of Care established for management of chronic disease states (Essential hypertension, Stage 3a chronic kidney disease, Mixed hyperlipidemia, Prediabetes, Primary Osteoarthritis)   ?Priority: High  ?  ? ?Long-Range Goal: Establishment of Plan of Care for management of chronic disease states (Essential hypertension, Stage 3a chronic kidney disease, Mixed hyperlipidemia, Prediabetes, Primary Osteoarthritis)   ?Start Date: 11/12/2021  ?Expected End Date: 11/12/2022  ?Recent Progress: On track  ?Priority: High  ?Note:   ?Current Barriers:  ?Knowledge Deficits related to plan of care for management of Essential hypertension, Stage 3a chronic kidney disease, Mixed hyperlipidemia, Prediabetes, Primary Osteoarthritis  ?Chronic Disease Management support and education needs related to Essential hypertension, Stage 3a chronic kidney disease, Mixed hyperlipidemia, Prediabetes, Primary Osteoarthritis   ? ?RNCM Clinical Goal(s):  ?Patient will verbalize basic understanding of  Essential hypertension, Stage 3a chronic kidney disease, Mixed hyperlipidemia, Prediabetes disease process and self health management plan as evidenced by  patient will report having no disease exacerbations related to his chronic disease states as listed above  ?take all medications exactly as prescribed and will call provider for medication related questions as evidenced by patient will report having no missed doses of her prescribed medications  ?demonstrate Improved health management independence as evidenced by patient will report 100% adherence to her prescribed treatment plan  ?continue to work with RN Care Manager to address care management and care coordination needs related to  Essential hypertension, Stage 3a chronic kidney disease, Mixed hyperlipidemia, Prediabetes as evidenced by adherence to CM Team Scheduled appointments ?demonstrate ongoing self health care management ability   as evidenced by    through collaboration with RN Care manager, provider, and care team.  ? ?Interventions: ?1:1 collaboration with primary care provider regarding development and update of comprehensive plan of care as evidenced by provider attestation and co-signature ?Inter-disciplinary care team collaboration (see longitudinal plan of care) ?Evaluation of current treatment plan related to  self management and patient's adherence to plan as established by provider ? ?Diabetes Interventions:  (Status:  Condition stable.  Not addressed this visit.) Long Term Goal ?Assessed patient's understanding of A1c goal: <6.5% ?Review of patient status, including review of consultant's reports, relevant laboratory and other test results, and medications completed. ?Reviewed medications with patient and discussed importance of medication adherence ?Educated patient  on dietary and exercise recommendations; daily glycemic control FBS 80-130, <180 after meals;15'15' rule ?Advised patient, providing education and rationale, to check cbg daily before meals and at bedtime and record, calling the CCM team and or PCP for findings outside established parameters ?Discussed plans with patient for  ongoing care management follow up and provided patient with direct contact information for care management team ?Lab Results  ?Component Value Date  ? HGBA1C 6.3 (H) 11/14/2021  ?  ?Osteoarthritis Intervention

## 2022-03-04 ENCOUNTER — Ambulatory Visit: Payer: Medicare Other

## 2022-03-04 DIAGNOSIS — N1831 Chronic kidney disease, stage 3a: Secondary | ICD-10-CM

## 2022-03-04 DIAGNOSIS — I10 Essential (primary) hypertension: Secondary | ICD-10-CM

## 2022-03-04 NOTE — Chronic Care Management (AMB) (Signed)
?Chronic Care Management  ? ? Social Work Note ? ?03/04/2022 ?Name: Margaret Webster MRN: [id number] DOB: [birth year] ? ?SALLEY BOXLEY is a 61 y.o. year old female who is a primary care patient of [name], [place]. The CCM team was consulted to assist the patient with chronic disease management and/or care coordination needs related to:  HTN and CKD III .  ? ?Engaged with patient by telephone for follow up visit in response to provider referral for social work chronic care management and care coordination services.  ? ?Consent to Services:  ?The patient was given information about Chronic Care Management services, agreed to services, and gave verbal consent prior to initiation of services.  Please see initial visit note for detailed documentation.  ? ?Patient agreed to services and consent obtained.  ? ?Assessment: Review of patient past medical history, allergies, medications, and health status, including review of relevant consultants reports was performed today as part of a comprehensive evaluation and provision of chronic care management and care coordination services.    ? ?SDOH (Social Determinants of Health) assessments and interventions performed:   ? ?Advanced Directives Status: Not addressed in this encounter. ? ?CCM Care Plan ? ?Allergies  ?Allergen Reactions  ? Sulfa Antibiotics Other (See Comments)  ?  Hard to breathe  ? Crestor [Rosuvastatin] Other (See Comments)  ?  Makes her mouth numb and causes side pain  ? Ibuprofen Other (See Comments)  ?  Stomach upset  ? Polytrim [Polymyxin B-Trimethoprim] Itching and Swelling  ?  redness  ? Influenza Vaccines Hives  ? Lipitor [Atorvastatin Calcium] Other (See Comments)  ?  Muscle ache ?  ? Naproxen Nausea And Vomiting  ? ? ?Outpatient Encounter Medications as of 03/04/2022  ?Medication Sig  ? amLODipine (NORVASC) 2.5 MG tablet TAKE 1 TABLET BY MOUTH ONCE  DAILY  ? aspirin EC 81 MG tablet Take 81 mg by mouth daily.  ? Evolocumab (REPATHA SURECLICK) 357 MG/ML SOAJ  Inject 1 Dose into the skin every 14 (fourteen) days.  ? ezetimibe (ZETIA) 10 MG tablet Take 1 tablet (10 mg total) by mouth daily.  ? gabapentin (NEURONTIN) 100 MG capsule Take 1 capsule (100 mg total) by mouth 3 (three) times daily as needed. (Patient not taking: Reported on 02/26/2022)  ? HYDROcodone-acetaminophen (NORCO) 5-325 MG tablet 1-2 tabs po q6 hours prn pain  ? losartan (COZAAR) 25 MG tablet Take 12.5 mg by mouth daily.  ? metFORMIN (GLUCOPHAGE-XR) 500 MG 24 hr tablet TAKE 1 TABLET BY MOUTH ONCE  DAILY WITH BREAKFAST  ? NEXLETOL 180 MG TABS TAKE 1 TABLET BY MOUTH AT BEDTIME  ? nystatin cream (MYCOSTATIN) Apply 1 application topically 2 (two) times daily.  ? nystatin-triamcinolone ointment (MYCOLOG) Apply 1 application topically 2 (two) times daily.  ? triamcinolone cream (KENALOG) 0.5 % Apply 1 application topically 2 (two) times daily.  ? vitamin C (ASCORBIC ACID) 500 MG tablet Take 500 mg by mouth daily.  ? Vitamin D, Ergocalciferol, (DRISDOL) 1.25 MG (50000 UNIT) CAPS capsule Take 1 capsule (50,000 Units total) by mouth every 7 (seven) days.  ? ?No facility-administered encounter medications on file as of 03/04/2022.  ? ? ?Patient Active Problem List  ? Diagnosis Date Noted  ? Depression 07/05/2020  ? Ringing in ears 07/05/2020  ? CVA (cerebral vascular accident) ([place]) 02/26/2020  ? CKD (chronic kidney disease) stage 3, GFR 30-59 ml/min (HCC)   ? Bradycardia   ? Elevated TSH   ? Multiple joint pain 11/07/2019  ?  Acute pain of right shoulder 09/19/2019  ? Right hip pain 09/19/2019  ? Prediabetes 09/08/2018  ? Mass of joint of finger 07/26/2015  ? Chest pain 01/10/2015  ? DM (diabetes mellitus) ([place]) 01/10/2015  ? Hypertension 01/10/2015  ? Hypothyroidism 01/10/2015  ? Headache 01/10/2015  ? Abdominal pain 01/10/2015  ? Vaginitis 04/07/2014  ? Pain 04/07/2014  ? Vaginal irritation 04/07/2014  ? Boil of buttock 04/06/2014  ? BV (bacterial vaginosis) 04/06/2014  ? Unspecified symptom associated with female  genital organs 11/16/2013  ? Candidiasis of vulva and vagina 11/16/2013  ? Vaginitis and vulvovaginitis, unspecified 06/07/2013  ? Pain aggravated by activities of daily living 06/07/2013  ? S/P cervical spinal fusion 01/17/2013  ? Cervical radiculopathy 12/13/2012  ? Carpal tunnel syndrome on both sides 12/06/2012  ? Cervical spondylosis with myelopathy 12/06/2012  ? Nonspecific abnormal finding in stool contents 12/02/2011  ? Diabetes mellitus without mention of complication 01/60/1093  ? Chronic back pain   ? ? ?Conditions to be addressed/monitored: HTN and CKD Stage III ;  Limited knowledge of [place] [facility] ? ?Care Plan : Social Work Plan of Care  ?Updates made by Daneen Schick since 03/04/2022 12:00 AM  ?  ? ?Problem: Quality of Life (General Plan of Care)   ?  ? ?Goal: Quality of Life Maintained   ?Start Date: 03/04/2022  ?Priority: High  ?Note:   ?Current Barriers:  ?Chronic disease management support and education needs related to HTN and CKD Stage III   ?Limited knowledge of how to acces [employer] ? ?Social Worker Clinical Goal(s):  ?patient will work with SW to identify and address any acute and/or chronic care coordination needs related to the self health management of HTN and CKD Stage III   ?Patient will work with SW to become more knowledgeable of [employer] program ?SW Interventions:  ?Inter-disciplinary care team collaboration (see longitudinal plan of care) ?Collaboration with [name], FNP regarding development and update of comprehensive plan of care as evidenced by provider attestation and co-signature ?Collaboration with RN Care Manager who requests SW work with patient to assist with enrollment into a local [employer]akers program ?Researched programs offered in the area ?Successful outbound call placed to the patient who acknowledges she would like this information but is unable to speak at this time ?SW to follow up with the patient over the next week ? ?Patient  Goals/Self-Care Activities ?patient will:  ? -  Engage with SW to gain a better understanding of the [employer] program ? ?Follow Up Plan: The care management team will reach out to the patient again over the next 10 days.  ? ?  ?  ? ?Follow Up Plan: SW will follow up with patient by phone over the next 14 days. ?     ?Daneen Schick, BSW, CDP ?Social Worker, Certified Dementia Practitioner ?[facility] / [place] [facility] ?[phone] ? ?   ? ? ? ? ?

## 2022-03-04 NOTE — Patient Instructions (Signed)
Social Worker Visit Information ? ?Goals we discussed today:  ?Patient Goals/Self-Care Activities ?patient will:  ? -  Engage with SW to gain a better understanding of the [employer] program ? ?Materials Provided: No: Patient declined ? ?Patient verbalizes understanding of instructions and care plan provided today and agrees to view in [place]. Active MyChart status confirmed with patient.   ? ?Follow Up Plan: SW will follow up with patient by phone over the next 10 days. ? ? ?Daneen Schick, BSW, CDP ?Social Worker, Certified Dementia Practitioner ?[facility] / [place] [facility] ?[phone] ? ?   ? ?

## 2022-03-10 ENCOUNTER — Ambulatory Visit: Payer: Medicare Other

## 2022-03-10 DIAGNOSIS — N1831 Chronic kidney disease, stage 3a: Secondary | ICD-10-CM

## 2022-03-10 DIAGNOSIS — I10 Essential (primary) hypertension: Secondary | ICD-10-CM

## 2022-03-10 NOTE — Chronic Care Management (AMB) (Signed)
?Chronic Care Management  ? ? Social Work Note ? ?03/10/2022 ?Name: Margaret Webster MRN: [id number] DOB: 1961/03/29 ? ?Margaret Webster is a 61 y.o. year old female who is a primary care patient of [name], [place]. The CCM team was consulted to assist the patient with chronic disease management and/or care coordination needs related to:  HTN, CKD III .  ? ?Engaged with patient by telephone for follow up visit in response to provider referral for social work chronic care management and care coordination services.  ? ?Consent to Services:  ?The patient was given information about Chronic Care Management services, agreed to services, and gave verbal consent prior to initiation of services.  Please see initial visit note for detailed documentation.  ? ?Patient agreed to services and consent obtained.  ? ?Assessment: Review of patient past medical history, allergies, medications, and health status, including review of relevant consultants reports was performed today as part of a comprehensive evaluation and provision of chronic care management and care coordination services.    ? ?SDOH (Social Determinants of Health) assessments and interventions performed:   ? ?Advanced Directives Status: Not addressed in this encounter. ? ?CCM Care Plan ? ?Allergies  ?Allergen Reactions  ? Sulfa Antibiotics Other (See Comments)  ?  Hard to breathe  ? Crestor [Rosuvastatin] Other (See Comments)  ?  Makes her mouth numb and causes side pain  ? Ibuprofen Other (See Comments)  ?  Stomach upset  ? Polytrim [Polymyxin B-Trimethoprim] Itching and Swelling  ?  redness  ? Influenza Vaccines Hives  ? Lipitor [Atorvastatin Calcium] Other (See Comments)  ?  Muscle ache ?  ? Naproxen Nausea And Vomiting  ? ? ?Outpatient Encounter Medications as of 03/10/2022  ?Medication Sig  ? amLODipine (NORVASC) 2.5 MG tablet TAKE 1 TABLET BY MOUTH ONCE  DAILY  ? aspirin EC 81 MG tablet Take 81 mg by mouth daily.  ? Evolocumab (REPATHA SURECLICK) 283 MG/ML SOAJ  Inject 1 Dose into the skin every 14 (fourteen) days.  ? ezetimibe (ZETIA) 10 MG tablet Take 1 tablet (10 mg total) by mouth daily.  ? gabapentin (NEURONTIN) 100 MG capsule Take 1 capsule (100 mg total) by mouth 3 (three) times daily as needed. (Patient not taking: Reported on 02/26/2022)  ? HYDROcodone-acetaminophen (NORCO) 5-325 MG tablet 1-2 tabs po q6 hours prn pain  ? losartan (COZAAR) 25 MG tablet Take 12.5 mg by mouth daily.  ? metFORMIN (GLUCOPHAGE-XR) 500 MG 24 hr tablet TAKE 1 TABLET BY MOUTH ONCE  DAILY WITH BREAKFAST  ? NEXLETOL 180 MG TABS TAKE 1 TABLET BY MOUTH AT BEDTIME  ? nystatin cream (MYCOSTATIN) Apply 1 application topically 2 (two) times daily.  ? nystatin-triamcinolone ointment (MYCOLOG) Apply 1 application topically 2 (two) times daily.  ? triamcinolone cream (KENALOG) 0.5 % Apply 1 application topically 2 (two) times daily.  ? vitamin C (ASCORBIC ACID) 500 MG tablet Take 500 mg by mouth daily.  ? Vitamin D, Ergocalciferol, (DRISDOL) 1.25 MG (50000 UNIT) CAPS capsule Take 1 capsule (50,000 Units total) by mouth every 7 (seven) days.  ? ?No facility-administered encounter medications on file as of 03/10/2022.  ? ? ?Patient Active Problem List  ? Diagnosis Date Noted  ? Depression 07/05/2020  ? Ringing in ears 07/05/2020  ? CVA (cerebral vascular accident) ([place]) 02/26/2020  ? CKD (chronic kidney disease) stage 3, GFR 30-59 ml/min (HCC)   ? Bradycardia   ? Elevated TSH   ? Multiple joint pain 11/07/2019  ? Acute  pain of right shoulder 09/19/2019  ? Right hip pain 09/19/2019  ? Prediabetes 09/08/2018  ? Mass of joint of finger 07/26/2015  ? Chest pain 01/10/2015  ? DM (diabetes mellitus) ([place]) 01/10/2015  ? Hypertension 01/10/2015  ? Hypothyroidism 01/10/2015  ? Headache 01/10/2015  ? Abdominal pain 01/10/2015  ? Vaginitis 04/07/2014  ? Pain 04/07/2014  ? Vaginal irritation 04/07/2014  ? Boil of buttock 04/06/2014  ? BV (bacterial vaginosis) 04/06/2014  ? Unspecified symptom associated with female  genital organs 11/16/2013  ? Candidiasis of vulva and vagina 11/16/2013  ? Vaginitis and vulvovaginitis, unspecified 06/07/2013  ? Pain aggravated by activities of daily living 06/07/2013  ? S/P cervical spinal fusion 01/17/2013  ? Cervical radiculopathy 12/13/2012  ? Carpal tunnel syndrome on both sides 12/06/2012  ? Cervical spondylosis with myelopathy 12/06/2012  ? Nonspecific abnormal finding in stool contents 12/02/2011  ? Diabetes mellitus without mention of complication 81/10/7508  ? Chronic back pain   ? ? ?Conditions to be addressed/monitored: HTN and CKD Stage III ;  Limited understanding of health plan benefits. ? ?Care Plan : Social Work Plan of Care  ?Updates made by Daneen Schick since 03/10/2022 12:00 AM  ?Completed 03/10/2022  ? ?Problem: Quality of Life (General Plan of Care) Resolved 03/10/2022  ?  ? ?Goal: Quality of Life Maintained Completed 03/10/2022  ?Start Date: 03/04/2022  ?Priority: High  ?Note:   ?Current Barriers:  ?Chronic disease management support and education needs related to HTN and CKD Stage III   ?Limited knowledge of how to acces [employer] ? ?Social Worker Clinical Goal(s):  ?patient will work with SW to identify and address any acute and/or chronic care coordination needs related to the self health management of HTN and CKD Stage III   ?Patient will work with SW to become more knowledgeable of [employer] program ?SW Interventions:  ?Inter-disciplinary care team collaboration (see longitudinal plan of care) ?Collaboration with [name], FNP regarding development and update of comprehensive plan of care as evidenced by provider attestation and co-signature ?Telephonic visit completed with the patient to assist with care coordination needs ?Discussed the patient has a [employer] benefit and would like to participate in a local program but is unsure where it is offered ?Advised the patient the [place] program is offered at all [facility] in [place] ?Discussed  plans for patient to visit the [facility] of her choosing to enroll in this program ?Encouraged the patient to contact SW if needed ? ?Patient Goals/Self-Care Activities ?patient will:  ? -  Visit a local [employer] to enroll in [employer] ? ? ? ?  ?  ? ?Follow Up Plan:  No SW follow up planned at this time. The patient will remain engaged with RN Care Manager for care management needs. ?     ?Daneen Schick, BSW, CDP ?Social Worker, Certified Dementia Practitioner ?[facility] / [place] [facility] ?[phone] ? ?   ? ? ? ? ?

## 2022-03-10 NOTE — Patient Instructions (Signed)
Social Worker Visit Information ? ?Goals we discussed today:  ?Patient Goals/Self-Care Activities ?patient will:  ? - Visit a local [employer] to enroll in [employer] ? ?Materials Provided: Verbal education about [employer] provided by phone ? ?Patient verbalizes understanding of instructions and care plan provided today and agrees to view in [place]. Active MyChart status confirmed with patient.   ? ?Follow Up Plan:  No follow up planned at this time. Please contact me as needed. ? ?Daneen Schick, BSW, CDP ?Social Worker, Certified Dementia Practitioner ?[facility] / [place] [facility] ?[phone] ? ?   ? ?

## 2022-03-13 ENCOUNTER — Encounter: Payer: Self-pay | Admitting: Nurse Practitioner

## 2022-03-13 DIAGNOSIS — M549 Dorsalgia, unspecified: Secondary | ICD-10-CM | POA: Diagnosis not present

## 2022-03-13 DIAGNOSIS — I129 Hypertensive chronic kidney disease with stage 1 through stage 4 chronic kidney disease, or unspecified chronic kidney disease: Secondary | ICD-10-CM | POA: Diagnosis not present

## 2022-03-13 DIAGNOSIS — N1831 Chronic kidney disease, stage 3a: Secondary | ICD-10-CM | POA: Diagnosis not present

## 2022-03-13 DIAGNOSIS — Z720 Tobacco use: Secondary | ICD-10-CM | POA: Diagnosis not present

## 2022-03-13 DIAGNOSIS — G8929 Other chronic pain: Secondary | ICD-10-CM | POA: Diagnosis not present

## 2022-03-13 DIAGNOSIS — E1122 Type 2 diabetes mellitus with diabetic chronic kidney disease: Secondary | ICD-10-CM | POA: Diagnosis not present

## 2022-03-13 LAB — COMPREHENSIVE METABOLIC PANEL
Albumin: 4.4 (ref 3.5–5.0)
Calcium: 9.9 (ref 8.7–10.7)
eGFR: 45

## 2022-03-13 LAB — BASIC METABOLIC PANEL
BUN: 26 — AB (ref 4–21)
CO2: 26 — AB (ref 13–22)
Chloride: 102 (ref 99–108)
Creatinine: 1.3 — AB (ref 0.5–1.1)
Glucose: 81
Potassium: 5.1 mEq/L (ref 3.5–5.1)
Sodium: 138 (ref 137–147)

## 2022-03-26 DIAGNOSIS — Z7984 Long term (current) use of oral hypoglycemic drugs: Secondary | ICD-10-CM

## 2022-03-26 DIAGNOSIS — M1990 Unspecified osteoarthritis, unspecified site: Secondary | ICD-10-CM | POA: Diagnosis not present

## 2022-03-26 DIAGNOSIS — I10 Essential (primary) hypertension: Secondary | ICD-10-CM

## 2022-03-26 DIAGNOSIS — N1831 Chronic kidney disease, stage 3a: Secondary | ICD-10-CM | POA: Diagnosis not present

## 2022-03-26 DIAGNOSIS — E782 Mixed hyperlipidemia: Secondary | ICD-10-CM

## 2022-03-26 DIAGNOSIS — E1122 Type 2 diabetes mellitus with diabetic chronic kidney disease: Secondary | ICD-10-CM

## 2022-03-26 DIAGNOSIS — M1991 Primary osteoarthritis, unspecified site: Secondary | ICD-10-CM

## 2022-04-04 ENCOUNTER — Other Ambulatory Visit: Payer: Self-pay | Admitting: *Deleted

## 2022-04-04 ENCOUNTER — Ambulatory Visit: Payer: Medicare Other | Admitting: Internal Medicine

## 2022-04-04 DIAGNOSIS — E785 Hyperlipidemia, unspecified: Secondary | ICD-10-CM

## 2022-04-04 NOTE — Progress Notes (Deleted)
Cardiology Office Note:    Date:  04/04/2022   ID:  Margaret Webster, DOB 13-Aug-1961, MRN [id number]  PCP:  [name], [place] [facility] Cardiologist:  Werner Lean, MD     Referring MD: Glendale Chard, MD   CC:  FH follow up  History of Present Illness:    Margaret Webster is a 61 y.o. female with a hx of HTN, with DM, proir CVA, FH, CKD NOS, tobacco abuse who presents for evaluation. 2023: [facility] (Dr. Debara Pickett) has started PCSK9i.  Had some bruising and relearned the technique.  Mild AS on Echo  Patient notes that (s)he is doing ***.   Since last visit notes *** . There are no*** interval hospital/ED visit.    No chest pain or pressure ***.  No SOB/DOE*** and no PND/Orthopnea***.  No weight gain or leg swelling***.  No palpitations or syncope ***.  Ambulatory blood pressure ***.    Past Medical History:  Diagnosis Date   Arthritis    bil shoulders   Bronchitis    Chronic back pain    Chronic kidney disease    Diabetes mellitus    stopped Mrtformin 1 mo ago, made her "sick"   Fatty liver    Hypertension    Hypothyroidism    Stroke [facility])    Thyroid disease    Trigger thumb of right hand     Past Surgical History:  Procedure Laterality Date   BACK SURGERY  1993   CARPAL TUNNEL RELEASE  08/2015   CARPAL TUNNEL RELEASE Left 09/10/2017   Procedure: CARPAL TUNNEL RELEASE;  Surgeon: Leanora Cover, MD;  Location: [place];  Service: Orthopedics;  Laterality: Left;   CERVICAL SPINE SURGERY Right 11/2012   OOPHORECTOMY     TRIGGER FINGER RELEASE Right 08/06/2017   Procedure: RIGHT THUMB TRIGGER RELEASE;  Surgeon: Leanora Cover, MD;  Location: [place];  Service: Orthopedics;  Laterality: Right;   ULNAR COLLATERAL LIGAMENT REPAIR Right 03/21/2021   Procedure: RIGHT THUMB ULNAR COLLATERAL LIGAMENT REPAIR;  Surgeon: Leanora Cover, MD;  Location: [place];  Service: Orthopedics;  Laterality:  Right;    Current Medications: No outpatient medications have been marked as taking for the 04/04/22 encounter (Appointment) with Werner Lean, MD.     Allergies:   Sulfa antibiotics, Crestor [rosuvastatin], Ibuprofen, Polytrim [polymyxin b-trimethoprim], Influenza vaccines, Lipitor [atorvastatin calcium], and Naproxen   Social History   Socioeconomic History   Marital status: Single    Spouse name: Not on file   Number of children: 1   Years of education: Not on file   Highest education level: Not on file  Occupational History   Occupation: DISABLED    Employer: DISABLED  Tobacco Use   Smoking status: Every Day    Packs/day: 0.25    Types: Cigarettes   Smokeless tobacco: Never   Tobacco comments:    4 cig a day, not interested in chantix  Vaping Use   Vaping Use: Never used  Substance and Sexual Activity   Alcohol use: Yes    Comment: occasional   Drug use: Yes    Types: Marijuana   Sexual activity: Yes    Partners: Male    Birth control/protection: None  Other Topics Concern   Not on file  Social History Narrative   Not on file   Social Determinants of Health   Financial Resource Strain: Low Risk  (04/24/2021)   Overall Financial  Resource Strain (CARDIA)    Difficulty of Paying Living Expenses: Not hard at all  Food Insecurity: No Food Insecurity (04/24/2021)   Hunger Vital Sign    Worried About Running Out of Food in the Last Year: Never true    Ran Out of Food in the Last Year: Never true  Transportation Needs: No Transportation Needs (08/16/2021)   PRAPARE - Hydrologist (Medical): No    Lack of Transportation (Non-Medical): No  Physical Activity: Inactive (04/24/2021)   Exercise Vital Sign    Days of Exercise per Week: 0 days    Minutes of Exercise per Session: 0 min  Stress: No Stress Concern Present (04/24/2021)   [place]    Feeling of Stress : Not  at all  Social Connections: Not on file     Family History: The patient's family history includes Cancer in her sister; Clotting disorder in her maternal grandfather; Cystic fibrosis in her sister; Diabetes in her mother; Heart disease in her maternal grandmother.  ROS:   Please see the history of present illness.     All other systems reviewed and are negative.  EKGs/Labs/Other Studies Reviewed:    The following studies were reviewed today:  EKG:   12/04/21: SR rate 70 inferior TWI   Recent Labs: 11/14/2021: BUN 21; Creatinine, Ser 1.42; Hemoglobin 15.9; Platelets 276; Potassium 4.3; Sodium 140 02/05/2022: ALT 15  Recent Lipid Panel    Component Value Date/Time   CHOL 153 02/05/2022 0716   TRIG 159 (H) 02/05/2022 0716   HDL 44 02/05/2022 0716   CHOLHDL 3.5 02/05/2022 0716   CHOLHDL 7.1 02/27/2020 0234   VLDL 37 02/27/2020 0234   LDLCALC 82 02/05/2022 0716       Physical Exam:    VS:  There were no vitals taken for this visit.    Wt Readings from Last 3 Encounters:  01/21/22 223 lb (101.2 kg)  12/25/21 220 lb (99.8 kg)  12/04/21 223 lb (101.2 kg)     Gen: No distress, Morbid Obesity   Neck: No JVD,  Cardiac: No Rubs or Gallops, holosystolic Murmur, RRR +2 radial pulses Respiratory: Clear to auscultation bilaterally, normal effort, normal  respiratory rate GI: Soft, nontender, non-distended  MS: No  edema; moves all extremities Integument: Skin feels warm Neuro:  At time of evaluation, alert and oriented to person/place/time/situation, notes residual R sided tingling since stroke Psych: Normal affect, patient feels OK,  ASSESSMENT:    No diagnosis found.  PLAN:    TWI with no sx- low threshold for lexiscan; have discussed this with patient  HTN, with DM, Prior CVA FH,  CKD NOS Tobacco abuse Statin myalgias - discussed smoking cessation - LDL goa < 55  Mild AS - next echo 2026  One year me or APP      Medication Adjustments/Labs and Tests  Ordered: Current medicines are reviewed at length with the patient today.  Concerns regarding medicines are outlined above.  No orders of the defined types were placed in this encounter.  No orders of the defined types were placed in this encounter.   There are no Patient Instructions on file for this visit.   Signed, Werner Lean, MD  04/04/2022 7:58 AM    [place]

## 2022-04-15 ENCOUNTER — Ambulatory Visit (HOSPITAL_BASED_OUTPATIENT_CLINIC_OR_DEPARTMENT_OTHER): Payer: Medicare Other | Admitting: Internal Medicine

## 2022-04-23 ENCOUNTER — Other Ambulatory Visit: Payer: Self-pay

## 2022-04-23 DIAGNOSIS — E782 Mixed hyperlipidemia: Secondary | ICD-10-CM

## 2022-04-23 DIAGNOSIS — Z8673 Personal history of transient ischemic attack (TIA), and cerebral infarction without residual deficits: Secondary | ICD-10-CM

## 2022-04-23 DIAGNOSIS — Z789 Other specified health status: Secondary | ICD-10-CM

## 2022-04-23 MED ORDER — NEXLETOL 180 MG PO TABS: 1.0000 | ORAL_TABLET | Freq: Every day | ORAL | 1 refills | Status: DC

## 2022-05-08 ENCOUNTER — Ambulatory Visit (INDEPENDENT_AMBULATORY_CARE_PROVIDER_SITE_OTHER): Payer: Medicare Other

## 2022-05-08 ENCOUNTER — Encounter: Payer: Self-pay | Admitting: Nurse Practitioner

## 2022-05-08 ENCOUNTER — Ambulatory Visit (INDEPENDENT_AMBULATORY_CARE_PROVIDER_SITE_OTHER): Payer: Medicare Other | Admitting: Nurse Practitioner

## 2022-05-08 VITALS — BP 130/68 | HR 80 | Temp 98.3°F | Ht 63.0 in | Wt 218.0 lb

## 2022-05-08 VITALS — BP 130/70 | HR 80 | Temp 98.3°F | Ht 63.0 in | Wt 218.6 lb

## 2022-05-08 DIAGNOSIS — M542 Cervicalgia: Secondary | ICD-10-CM | POA: Diagnosis not present

## 2022-05-08 DIAGNOSIS — I10 Essential (primary) hypertension: Secondary | ICD-10-CM | POA: Diagnosis not present

## 2022-05-08 DIAGNOSIS — G44209 Tension-type headache, unspecified, not intractable: Secondary | ICD-10-CM

## 2022-05-08 DIAGNOSIS — N183 Chronic kidney disease, stage 3 unspecified: Secondary | ICD-10-CM | POA: Diagnosis not present

## 2022-05-08 DIAGNOSIS — Z1211 Encounter for screening for malignant neoplasm of colon: Secondary | ICD-10-CM | POA: Diagnosis not present

## 2022-05-08 DIAGNOSIS — I129 Hypertensive chronic kidney disease with stage 1 through stage 4 chronic kidney disease, or unspecified chronic kidney disease: Secondary | ICD-10-CM | POA: Diagnosis not present

## 2022-05-08 DIAGNOSIS — R7303 Prediabetes: Secondary | ICD-10-CM | POA: Diagnosis not present

## 2022-05-08 DIAGNOSIS — Z0000 Encounter for general adult medical examination without abnormal findings: Secondary | ICD-10-CM

## 2022-05-08 DIAGNOSIS — E559 Vitamin D deficiency, unspecified: Secondary | ICD-10-CM

## 2022-05-08 DIAGNOSIS — N1831 Chronic kidney disease, stage 3a: Secondary | ICD-10-CM

## 2022-05-08 DIAGNOSIS — Z23 Encounter for immunization: Secondary | ICD-10-CM

## 2022-05-08 DIAGNOSIS — E782 Mixed hyperlipidemia: Secondary | ICD-10-CM

## 2022-05-08 MED ORDER — AMOXICILLIN-POT CLAVULANATE 500-125 MG PO TABS: 1.0000 | ORAL_TABLET | Freq: Three times a day (TID) | ORAL | 0 refills | Status: DC

## 2022-05-08 NOTE — Progress Notes (Signed)
Subjective:   Margaret Webster is a 61 y.o. female who presents for Medicare Annual (Subsequent) preventive examination.  Review of Systems     Cardiac Risk Factors include: diabetes mellitus;hypertension;obesity (BMI >30kg/m2);smoking/ tobacco exposure     Objective:    Today's Vitals   05/08/22 1430  BP: 130/68  Pulse: 80  Temp: 98.3 F (36.8 C)  TempSrc: Oral  Weight: 218 lb (98.9 kg)  Height: '5\' 3"'$  (1.6 m)   Body mass index is 38.62 kg/m.     05/08/2022    2:52 PM 07/23/2021   12:09 AM 06/22/2021    1:46 PM 04/24/2021    9:35 AM 03/21/2021   10:39 AM 03/14/2021    4:47 PM 04/16/2020    9:34 AM  Advanced Directives  Does Patient Have a Medical Advance Directive? No No No No No No No  Would patient like information on creating a medical advance directive? No - Patient declined  No - Patient declined No - Patient declined No - Patient declined No - Patient declined No - Patient declined    Current Medications (verified) Outpatient Encounter Medications as of 05/08/2022  Medication Sig   amLODipine (NORVASC) 2.5 MG tablet TAKE 1 TABLET BY MOUTH ONCE  DAILY (Patient not taking: Reported on 05/08/2022)   aspirin EC 81 MG tablet Take 81 mg by mouth daily.   Bempedoic Acid (NEXLETOL) 180 MG TABS Take 1 tablet by mouth at bedtime.   Evolocumab (REPATHA SURECLICK) 841 MG/ML SOAJ Inject 1 Dose into the skin every 14 (fourteen) days.   ezetimibe (ZETIA) 10 MG tablet Take 1 tablet (10 mg total) by mouth daily.   gabapentin (NEURONTIN) 100 MG capsule Take 1 capsule (100 mg total) by mouth 3 (three) times daily as needed. (Patient not taking: Reported on 05/08/2022)   HYDROcodone-acetaminophen (NORCO) 5-325 MG tablet 1-2 tabs po q6 hours prn pain (Patient not taking: Reported on 05/08/2022)   losartan (COZAAR) 25 MG tablet Take 12.5 mg by mouth daily.   metFORMIN (GLUCOPHAGE-XR) 500 MG 24 hr tablet TAKE 1 TABLET BY MOUTH ONCE  DAILY WITH BREAKFAST   nystatin cream (MYCOSTATIN) Apply 1  application topically 2 (two) times daily.   nystatin-triamcinolone ointment (MYCOLOG) Apply 1 application topically 2 (two) times daily.   triamcinolone cream (KENALOG) 0.5 % Apply 1 application topically 2 (two) times daily.   vitamin C (ASCORBIC ACID) 500 MG tablet Take 500 mg by mouth daily. (Patient not taking: Reported on 05/08/2022)   Vitamin D, Ergocalciferol, (DRISDOL) 1.25 MG (50000 UNIT) CAPS capsule Take 1 capsule (50,000 Units total) by mouth every 7 (seven) days. (Patient not taking: Reported on 05/08/2022)   No facility-administered encounter medications on file as of 05/08/2022.    Allergies (verified) Sulfa antibiotics, Crestor [rosuvastatin], Ibuprofen, Polytrim [polymyxin b-trimethoprim], Influenza vaccines, Lipitor [atorvastatin calcium], and Naproxen   History: Past Medical History:  Diagnosis Date   Arthritis    bil shoulders   Bronchitis    Chronic back pain    Chronic kidney disease    Diabetes mellitus    stopped Mrtformin 1 mo ago, made her "sick"   Fatty liver    Hypertension    Hypothyroidism    Stroke [facility])    Thyroid disease    Trigger thumb of right hand    Past Surgical History:  Procedure Laterality Date   BACK SURGERY  1993   CARPAL TUNNEL RELEASE  08/2015   CARPAL TUNNEL RELEASE Left 09/10/2017   Procedure: CARPAL TUNNEL RELEASE;  Surgeon: Leanora Cover, MD;  Location: [place];  Service: Orthopedics;  Laterality: Left;   CERVICAL SPINE SURGERY Right 11/2012   OOPHORECTOMY     TRIGGER FINGER RELEASE Right 08/06/2017   Procedure: RIGHT THUMB TRIGGER RELEASE;  Surgeon: Leanora Cover, MD;  Location: [place];  Service: Orthopedics;  Laterality: Right;   ULNAR COLLATERAL LIGAMENT REPAIR Right 03/21/2021   Procedure: RIGHT THUMB ULNAR COLLATERAL LIGAMENT REPAIR;  Surgeon: Leanora Cover, MD;  Location: [place];  Service: Orthopedics;  Laterality: Right;   Family History  Problem Relation Age of Onset    Diabetes Mother    Cystic fibrosis Sister    Cancer Sister        ?   Heart disease Maternal Grandmother    Clotting disorder Maternal Grandfather    Social History   Socioeconomic History   Marital status: Single    Spouse name: Not on file   Number of children: 1   Years of education: Not on file   Highest education level: Not on file  Occupational History   Occupation: DISABLED    Employer: DISABLED  Tobacco Use   Smoking status: Every Day    Packs/day: 0.25    Types: Cigarettes   Smokeless tobacco: Never   Tobacco comments:    4 cig a day, not interested in chantix  Vaping Use   Vaping Use: Never used  Substance and Sexual Activity   Alcohol use: Not Currently    Comment: occasional   Drug use: Not Currently    Types: Marijuana   Sexual activity: Yes    Partners: Male    Birth control/protection: None  Other Topics Concern   Not on file  Social History Narrative   Not on file   Social Determinants of Health   Financial Resource Strain: Medium Risk (05/08/2022)   Overall Financial Resource Strain (CARDIA)    Difficulty of Paying Living Expenses: Somewhat hard  Food Insecurity: No Food Insecurity (05/08/2022)   Hunger Vital Sign    Worried About Running Out of Food in the Last Year: Never true    Ran Out of Food in the Last Year: Never true  Transportation Needs: No Transportation Needs (05/08/2022)   PRAPARE - Hydrologist (Medical): No    Lack of Transportation (Non-Medical): No  Physical Activity: Inactive (05/08/2022)   Exercise Vital Sign    Days of Exercise per Week: 0 days    Minutes of Exercise per Session: 0 min  Stress: No Stress Concern Present (05/08/2022)   [place]    Feeling of Stress : Not at all  Social Connections: Not on file    Tobacco Counseling Ready to quit: Not Answered Counseling given: Not Answered Tobacco comments: 4 cig a day,  not interested in chantix   Clinical Intake:  Pre-visit preparation completed: Yes  Pain : No/denies pain     Nutritional Status: BMI > 30  Obese Nutritional Risks: None Diabetes: Yes  How often do you need to have someone help you when you read instructions, pamphlets, or other written materials from your doctor or pharmacy?: 1 - Never What is the last grade level you completed in school?: 12th grade  Diabetic? Yes Nutrition Risk Assessment:  Has the patient had any N/V/D within the last 2 months?  No  Does the patient have any non-healing wounds?  No  Has the patient had any unintentional  weight loss or weight gain?  No   Diabetes:  Is the patient diabetic?  Yes  If diabetic, was a CBG obtained today?  No  Did the patient bring in their glucometer from home?  No  How often do you monitor your CBG's? Does not .   Financial Strains and Diabetes Management:  Are you having any financial strains with the device, your supplies or your medication? No .  Does the patient want to be seen by Chronic Care Management for management of their diabetes?  No  Would the patient like to be referred to a Nutritionist or for Diabetic Management?  No   Diabetic Exams:  Diabetic Eye Exam: Completed 07/24/2021 Diabetic Foot Exam: Completed 12/03/2021   Interpreter Needed?: No  Information entered by :: NAllen LPN   Activities of Daily Living    05/08/2022    2:54 PM  In your present state of health, do you have any difficulty performing the following activities:  Hearing? 0  Vision? 1  Comment blurry at times  Difficulty concentrating or making decisions? 0  Walking or climbing stairs? 0  Dressing or bathing? 0  Doing errands, shopping? 0  Preparing Food and eating ? N  Using the Toilet? N  In the past six months, have you accidently leaked urine? N  Do you have problems with loss of bowel control? N  Managing your Medications? N  Managing your Finances? N  Housekeeping or  managing your Housekeeping? N    Patient Care Team: [name], FNP as PCP - General (General Practice) Werner Lean, MD as PCP - Cardiology (Cardiology) Rex Kras, Claudette Stapler, RN as Case Manager  Indicate any recent Medical Services you may have received from other than [facility] providers in the past year (date may be approximate).     Assessment:   This is a routine wellness examination for [place].  Hearing/Vision screen Vision Screening - Comments:: Regular eye exams, [facility]  Dietary issues and exercise activities discussed: Current Exercise Habits: The patient does not participate in regular exercise at present   Goals Addressed             This Visit's Progress    Patient Stated       05/08/2022, wants to lose weight       Depression Screen    05/08/2022    2:54 PM 05/08/2022    2:13 PM 04/24/2021    9:36 AM 03/14/2021    3:29 PM 10/25/2020    2:25 PM 06/06/2020   10:39 AM 01/25/2020    3:27 PM  PHQ 2/9 Scores  PHQ - 2 Score 0 0 1 0 6 0   PHQ- 9 Score    0 8 1   Exception Documentation       Other- indicate reason in comment box  Not completed       just completed with CMA    Fall Risk    05/08/2022    2:53 PM 05/08/2022    2:12 PM 04/24/2021    9:36 AM 01/25/2020    3:26 PM 11/07/2019    3:53 PM  Fall Risk   Falls in the past year? 1 1 0 0 0  Comment tripped      Number falls in past yr: 0 0     Injury with Fall? 0 0     Risk for fall due to : Medication side effect History of fall(s) Medication side effect Medication side effect  Follow up Falls evaluation completed;Education provided;Falls prevention discussed Falls evaluation completed Falls evaluation completed;Education provided;Falls prevention discussed Falls evaluation completed;Education provided;Falls prevention discussed     FALL RISK PREVENTION PERTAINING TO THE HOME:  Any stairs in or around the home? Yes  If so, are there any without handrails? No  Home free of loose  throw rugs in walkways, pet beds, electrical cords, etc? Yes  Adequate lighting in your home to reduce risk of falls? Yes   ASSISTIVE DEVICES UTILIZED TO PREVENT FALLS:  Life alert? No  Use of a cane, walker or w/c? No  Grab bars in the bathroom? No  Shower chair or bench in shower? No  Elevated toilet seat or a handicapped toilet? No   TIMED UP AND GO:  Was the test performed? No .    Gait steady and fast without use of assistive device  Cognitive Function:        05/08/2022    2:55 PM 04/24/2021    9:42 AM 01/25/2020    3:28 PM 10/25/2019    9:57 AM  6CIT Screen  What Year? 0 points 0 points 0 points 0 points  What month? 0 points 0 points 0 points 0 points  What time? 0 points 3 points 0 points 0 points  Count back from 20 0 points 0 points 0 points 0 points  Months in reverse 2 points 0 points 0 points 4 points  Repeat phrase 4 points 6 points 2 points 8 points  Total Score 6 points 9 points 2 points 12 points    Immunizations Immunization History  Administered Date(s) Administered   Influenza-Unspecified 09/10/2020   PFIZER(Purple Top)SARS-COV-2 Vaccination 01/12/2020, 02/07/2020, 09/10/2020   Tdap 05/27/2013, 03/21/2015, 05/10/2018    TDAP status: Up to date  Flu Vaccine status: Up to date  Pneumococcal vaccine status: Up to date  Covid-19 vaccine status: Completed vaccines  Qualifies for Shingles Vaccine? Yes   Zostavax completed No   Shingrix Completed?: Yes  Screening Tests Health Maintenance  Topic Date Due   Zoster Vaccines- Shingrix (1 of 2) Never done   COLONOSCOPY (Pts 45-37yr Insurance coverage will need to be confirmed)  12/07/2021   COVID-19 Vaccine (4 - Booster for Pfizer series) 05/14/2022 (Originally 11/05/2020)   HEMOGLOBIN A1C  05/14/2022   INFLUENZA VACCINE  05/27/2022   OPHTHALMOLOGY EXAM  07/24/2022   FOOT EXAM  12/03/2022   MAMMOGRAM  07/09/2023   PAP SMEAR-Modifier  12/03/2024   TETANUS/TDAP  05/10/2028   Hepatitis C  Screening  Completed   HIV Screening  Completed   HPV VACCINES  Aged Out    Health Maintenance  Health Maintenance Due  Topic Date Due   Zoster Vaccines- Shingrix (1 of 2) Never done   COLONOSCOPY (Pts 45-435yrInsurance coverage will need to be confirmed)  12/07/2021    Colorectal cancer screening: request report  Mammogram status: Completed 07/08/2021. Repeat every year  Bone Density status: n/a  Lung Cancer Screening: (Low Dose CT Chest recommended if Age 345-80ears, 30 pack-year currently smoking OR have quit w/in 15years.) does not qualify.   Lung Cancer Screening Referral: no  Additional Screening:  Hepatitis C Screening: does qualify; Completed 12/03/2021  Vision Screening: Recommended annual ophthalmology exams for early detection of glaucoma and other disorders of the eye. Is the patient up to date with their annual eye exam?  Yes  Who is the provider or what is the name of the office in which the patient attends annual eye exams? [facility] If pt is not established with a provider, would they like to be referred to a provider to establish care? No .   Dental Screening: Recommended annual dental exams for proper oral hygiene  Community Resource Referral / Chronic Care Management: CRR required this visit?  No   CCM required this visit?  No      Plan:     I have personally reviewed and noted the following in the patient's chart:   Medical and social history Use of alcohol, tobacco or illicit drugs  Current medications and supplements including opioid prescriptions.  Functional ability and status Nutritional status Physical activity Advanced directives List of other physicians Hospitalizations, surgeries, and ER visits in previous 12 months Vitals Screenings to include cognitive, depression, and falls Referrals and appointments  In addition, I have reviewed and discussed with patient certain preventive protocols, quality metrics, and best  practice recommendations. A written personalized care plan for preventive services as well as general preventive health recommendations were provided to patient.     Kellie Simmering, LPN   1/61/0960   Nurse Notes: none

## 2022-05-08 NOTE — Progress Notes (Signed)
[name],acting as a Education administrator for [name], FNP.,have documented all relevant documentation on the behalf of [name], FNP,as directed by  [name], FNP while in the presence of [name], [name].    Subjective:     Patient ID: Margaret Webster , female    DOB: November 17, 1960 , 61 y.o.   MRN: [id number]   Chief Complaint  Patient presents with   Hypertension   Diabetes    HPI  Patient presents today for bp and dm follow up. Patient states the right side of her neck is hurts, it was swollen for about a week. She is on Ozempic now from Cardiology  Hypertension This is a chronic problem. The current episode started more than 1 year ago. The problem is unchanged. Pertinent negatives include no chest pain, headaches or palpitations. There are no associated agents to hypertension. Risk factors for coronary artery disease include sedentary lifestyle. There are no compliance problems.  There is no history of angina. There is no history of chronic renal disease.  Diabetes She presents for her follow-up diabetic visit. Diabetes type: prediabetes. Her disease course has been stable. There are no hypoglycemic associated symptoms. Pertinent negatives for hypoglycemia include no dizziness, headaches or nervousness/anxiousness. There are no diabetic associated symptoms. Pertinent negatives for diabetes include no chest pain, no fatigue, no polydipsia, no polyphagia and no polyuria. There are no hypoglycemic complications. There are no diabetic complications. Risk factors for coronary artery disease include sedentary lifestyle. Current diabetic treatment includes diet. She is following a generally healthy diet. She has not had a previous visit with a dietitian. She rarely participates in exercise. She does not see a podiatrist.Eye exam is current (06/21/2020).     Past Medical History:  Diagnosis Date   Arthritis    bil shoulders   Bronchitis    Chronic back pain    Chronic kidney disease     Diabetes mellitus    stopped Mrtformin 1 mo ago, made her "sick"   Fatty liver    Hypertension    Hypothyroidism    Stroke [facility])    Thyroid disease    Trigger thumb of right hand      Family History  Problem Relation Age of Onset   Diabetes Mother    Cystic fibrosis Sister    Cancer Sister        ?   Heart disease Maternal Grandmother    Clotting disorder Maternal Grandfather      Current Outpatient Medications:    amoxicillin-clavulanate (AUGMENTIN) 500-125 MG tablet, Take 1 tablet (500 mg total) by mouth 3 (three) times daily., Disp: 14 tablet, Rfl: 0   aspirin EC 81 MG tablet, Take 81 mg by mouth daily., Disp: , Rfl:    Bempedoic Acid (NEXLETOL) 180 MG TABS, Take 1 tablet by mouth at bedtime., Disp: 90 tablet, Rfl: 1   Evolocumab (REPATHA SURECLICK) 035 MG/ML SOAJ, Inject 1 Dose into the skin every 14 (fourteen) days., Disp: 2 mL, Rfl: 11   ezetimibe (ZETIA) 10 MG tablet, Take 1 tablet (10 mg total) by mouth daily., Disp: 90 tablet, Rfl: 3   losartan (COZAAR) 25 MG tablet, Take 12.5 mg by mouth daily., Disp: , Rfl:    metFORMIN (GLUCOPHAGE-XR) 500 MG 24 hr tablet, TAKE 1 TABLET BY MOUTH ONCE  DAILY WITH BREAKFAST, Disp: 90 tablet, Rfl: 3   nystatin cream (MYCOSTATIN), Apply 1 application topically 2 (two) times daily., Disp: 30 g, Rfl: 0   nystatin-triamcinolone ointment (MYCOLOG), Apply  1 application topically 2 (two) times daily., Disp: 30 g, Rfl: 0   Semaglutide,0.25 or 0.5MG/DOS, (OZEMPIC, 0.25 OR 0.5 MG/DOSE,) 2 MG/1.5ML SOPN, Inject 0.25 mg into the skin as directed. 2 times a month on 15th and 30th., Disp: , Rfl:    triamcinolone cream (KENALOG) 0.5 %, Apply 1 application topically 2 (two) times daily., Disp: 30 g, Rfl: 0   amLODipine (NORVASC) 2.5 MG tablet, TAKE 1 TABLET BY MOUTH ONCE  DAILY (Patient not taking: Reported on 05/08/2022), Disp: 90 tablet, Rfl: 3   gabapentin (NEURONTIN) 100 MG capsule, Take 1 capsule (100 mg total) by mouth 3 (three) times daily as  needed. (Patient not taking: Reported on 05/08/2022), Disp: 30 capsule, Rfl: 2   HYDROcodone-acetaminophen (NORCO) 5-325 MG tablet, 1-2 tabs po q6 hours prn pain (Patient not taking: Reported on 05/08/2022), Disp: 20 tablet, Rfl: 0   vitamin C (ASCORBIC ACID) 500 MG tablet, Take 500 mg by mouth daily. (Patient not taking: Reported on 05/08/2022), Disp: , Rfl:    Vitamin D, Ergocalciferol, (DRISDOL) 1.25 MG (50000 UNIT) CAPS capsule, Take 1 capsule (50,000 Units total) by mouth every 7 (seven) days. (Patient not taking: Reported on 05/08/2022), Disp: 12 capsule, Rfl: 0   Allergies  Allergen Reactions   Sulfa Antibiotics Other (See Comments)    Hard to breathe   Crestor [Rosuvastatin] Other (See Comments)    Makes her mouth numb and causes side pain   Ibuprofen Other (See Comments)    Stomach upset   Polytrim [Polymyxin B-Trimethoprim] Itching and Swelling    redness   Influenza Vaccines Hives   Lipitor [Atorvastatin Calcium] Other (See Comments)    Muscle ache    Naproxen Nausea And Vomiting     Review of Systems  Constitutional: Negative.  Negative for fatigue.  HENT: Negative.    Eyes: Negative.   Respiratory: Negative.    Cardiovascular: Negative.  Negative for chest pain and palpitations.  Gastrointestinal: Negative.   Endocrine: Negative.  Negative for polydipsia, polyphagia and polyuria.  Genitourinary: Negative.   Musculoskeletal: Negative.   Skin: Negative.   Allergic/Immunologic: Negative.   Neurological: Negative.  Negative for dizziness and headaches.  Hematological: Negative.   Psychiatric/Behavioral: Negative.  The patient is not nervous/anxious.      Today's Vitals   05/08/22 1416  BP: 130/70  Pulse: 80  Temp: 98.3 F (36.8 C)  TempSrc: Oral  Weight: 218 lb 9.6 oz (99.2 kg)  Height: _0  (1.6 m)  PainSc: 0-No pain   Body mass index is 38.72 kg/m.  Wt Readings from Last 3 Encounters:  05/08/22 218 lb (98.9 kg)  05/08/22 218 lb 9.6 oz (99.2 kg)  01/21/22  223 lb (101.2 kg)     Objective:  Physical Exam Vitals reviewed.  Constitutional:      General: She is not in acute distress.    Appearance: Normal appearance. She is well-developed. She is obese.  HENT:     Head: Normocephalic.     Right Ear: Tympanic membrane, ear canal and external ear normal. There is no impacted cerumen.     Left Ear: Tympanic membrane, ear canal and external ear normal. There is no impacted cerumen.     Nose: Congestion present.     Mouth/Throat:     Mouth: Mucous membranes are moist.  Eyes:     Pupils: Pupils are equal, round, and reactive to light.  Neck:     Thyroid: No thyromegaly.     Trachea: No tracheal deviation.  Comments: Tenderness to right submandibular lymph nodes Cardiovascular:     Rate and Rhythm: Normal rate and regular rhythm.     Pulses: Normal pulses.     Heart sounds: Normal heart sounds. No murmur heard. Pulmonary:     Effort: Pulmonary effort is normal. No respiratory distress.     Breath sounds: Normal breath sounds. No wheezing.  Musculoskeletal:     Cervical back: Normal range of motion and neck supple. No rigidity or tenderness.  Lymphadenopathy:     Cervical: No cervical adenopathy.  Skin:    General: Skin is warm and dry.  Neurological:     General: No focal deficit present.     Mental Status: She is alert and oriented to person, place, and time.     Cranial Nerves: No cranial nerve deficit.     Motor: No weakness.  Psychiatric:        Mood and Affect: Mood normal.        Behavior: Behavior normal.        Thought Content: Thought content normal.        Judgment: Judgment normal.         Assessment And Plan:     1. Benign hypertension with CKD (chronic kidney disease) stage III (HCC) Comments: Blood pressure is controlled, continue current medications  - BMP8+eGFR  2. Prediabetes Comments: Heide Guile, continue focusing on healthy diet low in sugar and carbs. Encouraged to exercise regularly at least 150 minutes  a week as tolerated  3. Mixed hyperlipidemia Comments: Cholesterol levels slightly improved at last visit.   4. Vitamin D deficiency Will check vitamin D level and supplement as needed.    Also encouraged to spend 15 minutes in the sun daily.  - VITAMIN D 25 Hydroxy (Vit-D Deficiency, Fractures)  5. Neck pain Comments: ROM within normal range.  - CBC with Differential/Platelet - amoxicillin-clavulanate (AUGMENTIN) 500-125 MG tablet; Take 1 tablet (500 mg total) by mouth 3 (three) times daily.  Dispense: 14 tablet; Refill: 0  6. Acute non intractable tension-type headache Comments: Tenderness to frontal sinuses has been ongoing 1-2 weeks. DDx - sinus infection vs Migraine. Due to increasing nasal congestion will tx with ABT - amoxicillin-clavulanate (AUGMENTIN) 500-125 MG tablet; Take 1 tablet (500 mg total) by mouth 3 (three) times daily.  Dispense: 14 tablet; Refill: 0  7. Encounter for immunization Shingrix #1 administered during visit, TransRx form completed - Varicella-zoster vaccine IM (Shingrix)   8. Encounter for screening colonoscopy According to USPTF Colorectal cancer Screening guidelines. Colonoscopy is recommended every 10 years, starting at age 50 years. Will refer to GI for colon cancer screening. - Ambulatory referral to Gastroenterology   Patient was given opportunity to ask questions. Patient verbalized understanding of the plan and was able to repeat key elements of the plan. All questions were answered to their satisfaction.  [name], FNP   I, [name], FNP, have reviewed all documentation for this visit. The documentation on 05/08/22 for the exam, diagnosis, procedures, and orders are all accurate and complete.   IF YOU HAVE BEEN REFERRED TO A SPECIALIST, IT MAY TAKE 1-2 WEEKS TO SCHEDULE/PROCESS THE REFERRAL. IF YOU HAVE NOT HEARD FROM US/SPECIALIST IN TWO WEEKS, PLEASE GIVE [place] A CALL AT [phone].   THE PATIENT IS ENCOURAGED TO PRACTICE  SOCIAL DISTANCING DUE TO THE COVID-19 PANDEMIC.

## 2022-05-08 NOTE — Addendum Note (Signed)
Addended by: Kellie Simmering on: 05/08/2022 04:20 PM   Modules accepted: Orders

## 2022-05-08 NOTE — Patient Instructions (Signed)
Hypertension, Adult ?Hypertension is another name for high blood pressure. High blood pressure forces your heart to work harder to pump blood. This can cause problems over time. ?There are two numbers in a blood pressure reading. There is a top number (systolic) over a bottom number (diastolic). It is best to have a blood pressure that is below 120/80. ?What are the causes? ?The cause of this condition is not known. Some other conditions can lead to high blood pressure. ?What increases the risk? ?Some lifestyle factors can make you more likely to develop high blood pressure: ?Smoking. ?Not getting enough exercise or physical activity. ?Being overweight. ?Having too much fat, sugar, calories, or salt (sodium) in your diet. ?Drinking too much alcohol. ?Other risk factors include: ?Having any of these conditions: ?Heart disease. ?Diabetes. ?High cholesterol. ?Kidney disease. ?Obstructive sleep apnea. ?Having a family history of high blood pressure and high cholesterol. ?Age. The risk increases with age. ?Stress. ?What are the signs or symptoms? ?High blood pressure may not cause symptoms. Very high blood pressure (hypertensive crisis) may cause: ?Headache. ?Fast or uneven heartbeats (palpitations). ?Shortness of breath. ?Nosebleed. ?Vomiting or feeling like you may vomit (nauseous). ?Changes in how you see. ?Very bad chest pain. ?Feeling dizzy. ?Seizures. ?How is this treated? ?This condition is treated by making healthy lifestyle changes, such as: ?Eating healthy foods. ?Exercising more. ?Drinking less alcohol. ?Your doctor may prescribe medicine if lifestyle changes do not help enough and if: ?Your top number is above 130. ?Your bottom number is above 80. ?Your personal target blood pressure may vary. ?Follow these instructions at home: ?Eating and drinking ? ?If told, follow the DASH eating plan. To follow this plan: ?Fill one half of your plate at each meal with fruits and vegetables. ?Fill one fourth of your plate  at each meal with whole grains. Whole grains include whole-wheat pasta, Bocek rice, and whole-grain bread. ?Eat or drink low-fat dairy products, such as skim milk or low-fat yogurt. ?Fill one fourth of your plate at each meal with low-fat (lean) proteins. Low-fat proteins include fish, chicken without skin, eggs, beans, and tofu. ?Avoid fatty meat, cured and processed meat, or chicken with skin. ?Avoid pre-made or processed food. ?Limit the amount of salt in your diet to less than 1,500 mg each day. ?Do not drink alcohol if: ?Your doctor tells you not to drink. ?You are pregnant, may be pregnant, or are planning to become pregnant. ?If you drink alcohol: ?Limit how much you have to: ?0-1 drink a day for women. ?0-2 drinks a day for men. ?Know how much alcohol is in your drink. In the [place] one drink equals one 12 oz bottle of beer (355 mL), one 5 oz glass of wine (148 mL), or one 1? oz glass of hard liquor (44 mL). ?Lifestyle ? ?Work with your doctor to stay at a healthy weight or to lose weight. Ask your doctor what the best weight is for you. ?Get at least 30 minutes of exercise that causes your heart to beat faster (aerobic exercise) most days of the week. This may include walking, swimming, or biking. ?Get at least 30 minutes of exercise that strengthens your muscles (resistance exercise) at least 3 days a week. This may include lifting weights or doing Pilates. ?Do not smoke or use any products that contain nicotine or tobacco. If you need help quitting, ask your doctor. ?Check your blood pressure at home as told by your doctor. ?Keep all follow-up visits. ?Medicines ?Take over-the-counter and prescription medicines   only as told by your doctor. Follow directions carefully. ?Do not skip doses of blood pressure medicine. The medicine does not work as well if you skip doses. Skipping doses also puts you at risk for problems. ?Ask your doctor about side effects or reactions to medicines that you should watch  for. ?Contact a doctor if: ?You think you are having a reaction to the medicine you are taking. ?You have headaches that keep coming back. ?You feel dizzy. ?You have swelling in your ankles. ?You have trouble with your vision. ?Get help right away if: ?You get a very bad headache. ?You start to feel mixed up (confused). ?You feel weak or numb. ?You feel faint. ?You have very bad pain in your: ?Chest. ?Belly (abdomen). ?You vomit more than once. ?You have trouble breathing. ?These symptoms may be an emergency. Get help right away. Call 911. ?Do not wait to see if the symptoms will go away. ?Do not drive yourself to the hospital. ?Summary ?Hypertension is another name for high blood pressure. ?High blood pressure forces your heart to work harder to pump blood. ?For most people, a normal blood pressure is less than 120/80. ?Making healthy choices can help lower blood pressure. If your blood pressure does not get lower with healthy choices, you may need to take medicine. ?This information is not intended to replace advice given to you by your health care provider. Make sure you discuss any questions you have with your health care provider. ?Document Revised: 08/01/2021 Document Reviewed: 08/01/2021 ?[employer]vier Patient Education ? 2023 [employer] ? ?

## 2022-05-08 NOTE — Patient Instructions (Signed)
[name] , Thank you for taking time to come for your Medicare Wellness Visit. I appreciate your ongoing commitment to your health goals. Please review the following plan we discussed and let me know if I can assist you in the future.   Screening recommendations/referrals: Colonoscopy: requested from Dr. Benson [place] Mammogram: completed 07/08/2021, due 07/09/2022 Bone Density: n/a Recommended yearly ophthalmology/optometry visit for glaucoma screening and checkup Recommended yearly dental visit for hygiene and checkup  Vaccinations: Influenza vaccine: due 05/27/2022 Pneumococcal vaccine: n/a Tdap vaccine: completed 05/10/2018, due 05/10/2028 Shingles vaccine: needs second dose  Covid-19: 09/10/2020, 02/07/2020, 01/12/2020  Advanced directives: Advance directive discussed with you today. Even though you declined this today please call our office should you change your mind and we can give you the proper paperwork for you to fill out.  Conditions/risks identified: none  Next appointment: Follow up in one year for your annual wellness visit.   Preventive Care 40-64 Years, Female Preventive care refers to lifestyle choices and visits with your health care provider that can promote health and wellness. What does preventive care include? A yearly physical exam. This is also called an annual well check. Dental exams once or twice a year. Routine eye exams. Ask your health care provider how often you should have your eyes checked. Personal lifestyle choices, including: Daily care of your teeth and gums. Regular physical activity. Eating a healthy diet. Avoiding tobacco and drug use. Limiting alcohol use. Practicing safe sex. Taking low-dose aspirin daily starting at age 40. Taking vitamin and mineral supplements as recommended by your health care provider. What happens during an annual well check? The services and screenings done by your health care provider during your annual well check will depend  on your age, overall health, lifestyle risk factors, and family history of disease. Counseling  Your health care provider may ask you questions about your: Alcohol use. Tobacco use. Drug use. Emotional well-being. Home and relationship well-being. Sexual activity. Eating habits. Work and work Statistician. Method of birth control. Menstrual cycle. Pregnancy history. Screening  You may have the following tests or measurements: Height, weight, and BMI. Blood pressure. Lipid and cholesterol levels. These may be checked every 5 years, or more frequently if you are over 20 years old. Skin check. Lung cancer screening. You may have this screening every year starting at age 45 if you have a 30-pack-year history of smoking and currently smoke or have quit within the past 15 years. Fecal occult blood test (FOBT) of the stool. You may have this test every year starting at age 48. Flexible sigmoidoscopy or colonoscopy. You may have a sigmoidoscopy every 5 years or a colonoscopy every 10 years starting at age 43. Hepatitis C blood test. Hepatitis B blood test. Sexually transmitted disease (STD) testing. Diabetes screening. This is done by checking your blood sugar (glucose) after you have not eaten for a while (fasting). You may have this done every 1-3 years. Mammogram. This may be done every 1-2 years. Talk to your health care provider about when you should start having regular mammograms. This may depend on whether you have a family history of breast cancer. BRCA-related cancer screening. This may be done if you have a family history of breast, ovarian, tubal, or peritoneal cancers. Pelvic exam and Pap test. This may be done every 3 years starting at age 2. Starting at age 53, this may be done every 5 years if you have a Pap test in combination with an HPV test. Bone density scan.  This is done to screen for osteoporosis. You may have this scan if you are at high risk for osteoporosis. Discuss  your test results, treatment options, and if necessary, the need for more tests with your health care provider. Vaccines  Your health care provider may recommend certain vaccines, such as: Influenza vaccine. This is recommended every year. Tetanus, diphtheria, and acellular pertussis (Tdap, Td) vaccine. You may need a Td booster every 10 years. Zoster vaccine. You may need this after age 68. Pneumococcal 13-valent conjugate (PCV13) vaccine. You may need this if you have certain conditions and were not previously vaccinated. Pneumococcal polysaccharide (PPSV23) vaccine. You may need one or two doses if you smoke cigarettes or if you have certain conditions. Talk to your health care provider about which screenings and vaccines you need and how often you need them. This information is not intended to replace advice given to you by your health care provider. Make sure you discuss any questions you have with your health care provider. Document Released: 11/09/2015 Document Revised: 07/02/2016 Document Reviewed: 08/14/2015 Elsevier Interactive Patient Education  2017 [employer] Prevention in the Home Falls can cause injuries. They can happen to people of all ages. There are many things you can do to make your home safe and to help prevent falls. What can I do on the outside of my home? Regularly fix the edges of walkways and driveways and fix any cracks. Remove anything that might make you trip as you walk through a door, such as a raised step or threshold. Trim any bushes or trees on the path to your home. Use bright outdoor lighting. Clear any walking paths of anything that might make someone trip, such as rocks or tools. Regularly check to see if handrails are loose or broken. Make sure that both sides of any steps have handrails. Any raised decks and porches should have guardrails on the edges. Have any leaves, snow, or ice cleared regularly. Use sand or salt on walking paths  during winter. Clean up any spills in your garage right away. This includes oil or grease spills. What can I do in the bathroom? Use night lights. Install grab bars by the toilet and in the tub and shower. Do not use towel bars as grab bars. Use non-skid mats or decals in the tub or shower. If you need to sit down in the shower, use a plastic, non-slip stool. Keep the floor dry. Clean up any water that spills on the floor as soon as it happens. Remove soap buildup in the tub or shower regularly. Attach bath mats securely with double-sided non-slip rug tape. Do not have throw rugs and other things on the floor that can make you trip. What can I do in the bedroom? Use night lights. Make sure that you have a light by your bed that is easy to reach. Do not use any sheets or blankets that are too big for your bed. They should not hang down onto the floor. Have a firm chair that has side arms. You can use this for support while you get dressed. Do not have throw rugs and other things on the floor that can make you trip. What can I do in the kitchen? Clean up any spills right away. Avoid walking on wet floors. Keep items that you use a lot in easy-to-reach places. If you need to reach something above you, use a strong step stool that has a grab bar. Keep electrical cords  out of the way. Do not use floor polish or wax that makes floors slippery. If you must use wax, use non-skid floor wax. Do not have throw rugs and other things on the floor that can make you trip. What can I do with my stairs? Do not leave any items on the stairs. Make sure that there are handrails on both sides of the stairs and use them. Fix handrails that are broken or loose. Make sure that handrails are as long as the stairways. Check any carpeting to make sure that it is firmly attached to the stairs. Fix any carpet that is loose or worn. Avoid having throw rugs at the top or bottom of the stairs. If you do have throw  rugs, attach them to the floor with carpet tape. Make sure that you have a light switch at the top of the stairs and the bottom of the stairs. If you do not have them, ask someone to add them for you. What else can I do to help prevent falls? Wear shoes that: Do not have high heels. Have rubber bottoms. Are comfortable and fit you well. Are closed at the toe. Do not wear sandals. If you use a stepladder: Make sure that it is fully opened. Do not climb a closed stepladder. Make sure that both sides of the stepladder are locked into place. Ask someone to hold it for you, if possible. Clearly mark and make sure that you can see: Any grab bars or handrails. First and last steps. Where the edge of each step is. Use tools that help you move around (mobility aids) if they are needed. These include: Canes. Walkers. Scooters. Crutches. Turn on the lights when you go into a dark area. Replace any light bulbs as soon as they burn out. Set up your furniture so you have a clear path. Avoid moving your furniture around. If any of your floors are uneven, fix them. If there are any pets around you, be aware of where they are. Review your medicines with your doctor. Some medicines can make you feel dizzy. This can increase your chance of falling. Ask your doctor what other things that you can do to help prevent falls. This information is not intended to replace advice given to you by your health care provider. Make sure you discuss any questions you have with your health care provider. Document Released: 08/09/2009 Document Revised: 03/20/2016 Document Reviewed: 11/17/2014 Elsevier Interactive Patient Education  2017 [employer].

## 2022-05-09 ENCOUNTER — Telehealth: Payer: Self-pay | Admitting: *Deleted

## 2022-05-09 LAB — CBC WITH DIFFERENTIAL/PLATELET
Basophils Absolute: 0.1 10*3/uL (ref 0.0–0.2)
Basos: 1 %
EOS (ABSOLUTE): 0.1 10*3/uL (ref 0.0–0.4)
Eos: 2 %
Hematocrit: 40.7 % (ref 34.0–46.6)
Hemoglobin: 13.9 g/dL (ref 11.1–15.9)
Immature Grans (Abs): 0 10*3/uL (ref 0.0–0.1)
Immature Granulocytes: 0 %
Lymphocytes Absolute: 2.2 10*3/uL (ref 0.7–3.1)
Lymphs: 43 %
MCH: 30.6 pg (ref 26.6–33.0)
MCHC: 34.2 g/dL (ref 31.5–35.7)
MCV: 90 fL (ref 79–97)
Monocytes Absolute: 0.7 10*3/uL (ref 0.1–0.9)
Monocytes: 13 %
Neutrophils Absolute: 2.1 10*3/uL (ref 1.4–7.0)
Neutrophils: 41 %
Platelets: 286 10*3/uL (ref 150–450)
RBC: 4.54 x10E6/uL (ref 3.77–5.28)
RDW: 11.9 % (ref 11.7–15.4)
WBC: 5.2 10*3/uL (ref 3.4–10.8)

## 2022-05-09 LAB — BMP8+EGFR
BUN/Creatinine Ratio: 17 (ref 12–28)
BUN: 19 mg/dL (ref 8–27)
CO2: 23 mmol/L (ref 20–29)
Calcium: 10.1 mg/dL (ref 8.7–10.3)
Chloride: 103 mmol/L (ref 96–106)
Creatinine, Ser: 1.12 mg/dL — ABNORMAL HIGH (ref 0.57–1.00)
Glucose: 87 mg/dL (ref 70–99)
Potassium: 4.8 mmol/L (ref 3.5–5.2)
Sodium: 142 mmol/L (ref 134–144)
eGFR: 55 mL/min/{1.73_m2} — ABNORMAL LOW (ref >59)

## 2022-05-09 LAB — VITAMIN D 25 HYDROXY (VIT D DEFICIENCY, FRACTURES): Vit D, 25-Hydroxy: 26.2 ng/mL — ABNORMAL LOW (ref 30.0–100.0)

## 2022-05-09 NOTE — Telephone Encounter (Signed)
   Telephone encounter was:  Unsuccessful.  05/09/2022 Name: VAANI MORREN MRN: [id number] DOB: [birth year]  Unsuccessful outbound call made today to assist with:   Utilities  Outreach Attempt:  1st Attempt  A HIPAA compliant voice message was left requesting a return call.  Instructed patient to call back at   Instructed patient to call back at [phone]  at their earliest convenience. .  [facility], Care Management  [phone] [address] , [place] [address] Email : [email or web address]

## 2022-05-12 ENCOUNTER — Ambulatory Visit: Payer: Self-pay

## 2022-05-12 ENCOUNTER — Telehealth: Payer: Medicare Other

## 2022-05-12 DIAGNOSIS — E782 Mixed hyperlipidemia: Secondary | ICD-10-CM

## 2022-05-12 DIAGNOSIS — N1831 Chronic kidney disease, stage 3a: Secondary | ICD-10-CM

## 2022-05-12 DIAGNOSIS — R7303 Prediabetes: Secondary | ICD-10-CM

## 2022-05-12 DIAGNOSIS — E559 Vitamin D deficiency, unspecified: Secondary | ICD-10-CM

## 2022-05-12 DIAGNOSIS — M1991 Primary osteoarthritis, unspecified site: Secondary | ICD-10-CM

## 2022-05-12 DIAGNOSIS — I10 Essential (primary) hypertension: Secondary | ICD-10-CM

## 2022-05-12 MED ORDER — VITAMIN D (ERGOCALCIFEROL) 1.25 MG (50000 UNIT) PO CAPS: 50000.0000 [IU] | ORAL_CAPSULE | ORAL | 1 refills | Status: DC

## 2022-05-12 NOTE — Patient Instructions (Signed)
Visit Information  Thank you for taking time to visit with me today. Please don't hesitate to contact me if I can be of assistance to you before our next scheduled telephone appointment.  Following are the goals we discussed today:  Take all medications as prescribed Attend all scheduled provider appointments Call pharmacy for medication refills 3-7 days in advance of running out of medications Perform IADL's (shopping, preparing meals, housekeeping, managing finances) independently Call provider office for new concerns or questions  adhere to prescribed diet: low trans/Saturated fat develop an exercise routine  Our next appointment is by telephone on 06/23/22 at 1:15 PM   Please call the care guide team at [phone] if you need to cancel or reschedule your appointment.   If you are experiencing a Mental Health or [place] or need someone to talk to, please call 1-800-273-TALK (toll free, 24 hour hotline)   The patient verbalized understanding of instructions, educational materials, and care plan provided today and agreed to receive a mailed copy of patient instructions, educational materials, and care plan.   Barb Merino, RN, BSN, CCM Care Management Coordinator [facility]  Direct Phone: [phone]

## 2022-05-12 NOTE — Chronic Care Management (AMB) (Signed)
Care Management    RN Visit Note  05/12/2022 Name: Margaret Webster MRN: [id number] DOB: June 10, 1961  Subjective: Margaret Webster is a 61 y.o. year old female who is a primary care patient of Margaret Webster, [place]. The care management team was consulted for assistance with disease management and care coordination needs.    Engaged with patient by telephone for follow up visit in response to provider referral for case management and/or care coordination services.   Consent to Services:   Margaret Webster was given information about Care Management services today including:  Care Management services includes personalized support from designated clinical staff supervised by her physician, including individualized plan of care and coordination with other care providers 24/7 contact phone numbers for assistance for urgent and routine care needs. The patient may stop case management services at any time by phone call to the office staff.  Patient agreed to services and consent obtained.   Assessment: Review of patient past medical history, allergies, medications, health status, including review of consultants reports, laboratory and other test data, was performed as part of comprehensive evaluation and provision of chronic care management services.   SDOH (Social Determinants of Health) assessments and interventions performed:  Yes, no acute needs  Care Plan  Allergies  Allergen Reactions   Sulfa Antibiotics Other (See Comments)    Hard to breathe   Crestor [Rosuvastatin] Other (See Comments)    Makes her mouth numb and causes side pain   Ibuprofen Other (See Comments)    Stomach upset   Polytrim [Polymyxin B-Trimethoprim] Itching and Swelling    redness   Influenza Vaccines Hives   Lipitor [Atorvastatin Calcium] Other (See Comments)    Muscle ache    Naproxen Nausea And Vomiting    Outpatient Encounter Medications as of 05/12/2022  Medication Sig   amLODipine (NORVASC) 2.5 MG tablet TAKE 1  TABLET BY MOUTH ONCE  DAILY (Patient not taking: Reported on 05/08/2022)   amoxicillin-clavulanate (AUGMENTIN) 500-125 MG tablet Take 1 tablet (500 mg total) by mouth 3 (three) times daily.   aspirin EC 81 MG tablet Take 81 mg by mouth daily.   Bempedoic Acid (NEXLETOL) 180 MG TABS Take 1 tablet by mouth at bedtime.   Evolocumab (REPATHA SURECLICK) 638 MG/ML SOAJ Inject 1 Dose into the skin every 14 (fourteen) days.   ezetimibe (ZETIA) 10 MG tablet Take 1 tablet (10 mg total) by mouth daily.   gabapentin (NEURONTIN) 100 MG capsule Take 1 capsule (100 mg total) by mouth 3 (three) times daily as needed. (Patient not taking: Reported on 05/08/2022)   HYDROcodone-acetaminophen (NORCO) 5-325 MG tablet 1-2 tabs po q6 hours prn pain (Patient not taking: Reported on 05/08/2022)   losartan (COZAAR) 25 MG tablet Take 12.5 mg by mouth daily.   metFORMIN (GLUCOPHAGE-XR) 500 MG 24 hr tablet TAKE 1 TABLET BY MOUTH ONCE  DAILY WITH BREAKFAST   nystatin cream (MYCOSTATIN) Apply 1 application topically 2 (two) times daily.   nystatin-triamcinolone ointment (MYCOLOG) Apply 1 application topically 2 (two) times daily.   Semaglutide,0.25 or 0.5MG/DOS, (OZEMPIC, 0.25 OR 0.5 MG/DOSE,) 2 MG/1.5ML SOPN Inject 0.25 mg into the skin as directed. 2 times a month on 15th and 30th.   triamcinolone cream (KENALOG) 0.5 % Apply 1 application topically 2 (two) times daily.   vitamin C (ASCORBIC ACID) 500 MG tablet Take 500 mg by mouth daily. (Patient not taking: Reported on 05/08/2022)   Vitamin D, Ergocalciferol, (DRISDOL) 1.25 MG (50000 UNIT) CAPS capsule Take 1  capsule (50,000 Units total) by mouth every 7 (seven) days.   No facility-administered encounter medications on file as of 05/12/2022.    Patient Active Problem List   Diagnosis Date Noted   Depression 07/05/2020   Ringing in ears 07/05/2020   CVA (cerebral vascular accident) ([place]) 02/26/2020   CKD (chronic kidney disease) stage 3, GFR 30-59 ml/min (HCC)     Bradycardia    Elevated TSH    Multiple joint pain 11/07/2019   Acute pain of right shoulder 09/19/2019   Right hip pain 09/19/2019   Prediabetes 09/08/2018   Mass of joint of finger 07/26/2015   Chest pain 01/10/2015   DM (diabetes mellitus) ([place]) 01/10/2015   Hypertension 01/10/2015   Hypothyroidism 01/10/2015   Headache 01/10/2015   Abdominal pain 01/10/2015   Vaginitis 04/07/2014   Pain 04/07/2014   Vaginal irritation 04/07/2014   Boil of buttock 04/06/2014   BV (bacterial vaginosis) 04/06/2014   Unspecified symptom associated with female genital organs 11/16/2013   Candidiasis of vulva and vagina 11/16/2013   Vaginitis and vulvovaginitis, unspecified 06/07/2013   Pain aggravated by activities of daily living 06/07/2013   S/P cervical spinal fusion 01/17/2013   Cervical radiculopathy 12/13/2012   Carpal tunnel syndrome on both sides 12/06/2012   Cervical spondylosis with myelopathy 12/06/2012   Nonspecific abnormal finding in stool contents 12/02/2011   Diabetes mellitus without mention of complication 28/00/3491   Chronic back pain     Conditions to be addressed/monitored:  Essential hypertension, Stage 3a chronic kidney disease, Mixed hyperlipidemia, Prediabetes, Primary Osteoarthritis, Vitamin D deficiency  Care Plan : RN Care Manager Plan of Care  Updates made by Margaret Logan, RN since 05/12/2022 12:00 AM     Problem: No Plan of Care established for management of chronic disease states (Essential hypertension, Stage 3a chronic kidney disease, Mixed hyperlipidemia, Prediabetes, Primary Osteoarthritis)   Priority: High     Long-Range Goal: Establishment of Plan of Care for management of chronic disease states (Essential hypertension, Stage 3a chronic kidney disease, Mixed hyperlipidemia, Prediabetes, Primary Osteoarthritis)   Start Date: 11/12/2021  Expected End Date: 11/12/2022  Recent Progress: On track  Priority: High  Note:   Current Barriers:  Knowledge  Deficits related to plan of care for management of Essential hypertension, Stage 3a chronic kidney disease, Mixed hyperlipidemia, Prediabetes, Primary Osteoarthritis  Chronic Disease Management support and education needs related to Essential hypertension, Stage 3a chronic kidney disease, Mixed hyperlipidemia, Prediabetes, Primary Osteoarthritis    RNCM Clinical Goal(s):  Patient will verbalize basic understanding of  Essential hypertension, Stage 3a chronic kidney disease, Mixed hyperlipidemia, Prediabetes disease process and self health management plan as evidenced by patient will report having no disease exacerbations related to his chronic disease states as listed above  take all medications exactly as prescribed and will call provider for medication related questions as evidenced by patient will report having no missed doses of her prescribed medications  demonstrate Improved health management independence as evidenced by patient will report 100% adherence to her prescribed treatment plan  continue to work with RN Care Manager to address care management and care coordination needs related to  Essential hypertension, Stage 3a chronic kidney disease, Mixed hyperlipidemia, Prediabetes as evidenced by adherence to CM Team Scheduled appointments demonstrate ongoing self health care management ability   as evidenced by    through collaboration with RN Care manager, provider, and care team.   Interventions: 1:1 collaboration with primary care provider regarding development and update of comprehensive plan  of care as evidenced by provider attestation and co-signature Inter-disciplinary care team collaboration (see longitudinal plan of care) Evaluation of current treatment plan related to  self management and patient's adherence to plan as established by provider  Diabetes Interventions:  (Status:  Condition stable.  Not addressed this visit.) Long Term Goal Assessed patient's understanding of A1c goal:  <6.5% Review of patient status, including review of consultant's reports, relevant laboratory and other test results, and medications completed. Reviewed medications with patient and discussed importance of medication adherence Educated patient on dietary and exercise recommendations; daily glycemic control FBS 80-130, <180 after meals;15'15' rule Advised patient, providing education and rationale, to check cbg daily before meals and at bedtime and record, calling the CCM team and or PCP for findings outside established parameters Lab Results  Component Value Date   HGBA1C 6.3 (H) 11/14/2021    Osteoarthritis Interventions:  (Status:  Goal on track:  NO.) Long Term Goal Evaluation of current treatment plan related to Osteoarthritis, self-management and patient's adherence to plan as established by provider Pain assessment performed Medications reviewed Reviewed provider established plan for pain management Discussed importance of adherence to all scheduled medical appointments Counseled on the importance of reporting any/all new or changed pain symptoms or management strategies to pain management provider Advised patient to report to care team affect of pain on daily activities Discussed use of relaxation techniques and/or diversional activities to assist with pain reduction (distraction, imagery, relaxation, massage, acupressure, TENS, heat, and cold application Reviewed with patient prescribed pharmacological and nonpharmacological pain relief strategies Screening for signs and symptoms of depression related to chronic disease state  Educated patient regarding the PREP program, placed patient on PCP schedule for evaluation/referral to PREP Discussed plans with patient for ongoing care management follow up and provided patient with direct contact information for care management team   Hyperlipidemia Interventions:  (Status:  Condition stable.  Not addressed this visit.) Long Term  Goal Evaluation of current treatment plan related to Hyperlipidemia, self-management and patient's adherence to plan as established by provider Review of patient status, including review of consultant's reports, relevant laboratory and other test results, and medications completed Reviewed medications with patient and discussed importance of medication adherence, determined patient recently started Rap[place], she is self injecting without difficulty and is tolerating well  Determined patient continues to follow Dr. Debara Pickett for lipid management Reviewed and discussed recent Cardiology follow up with the following Assessment/Plan noted: ASSESSMENT: Mixed dyslipidemia with LDL greater than 190,?  Familial hyperlipidemia History of stroke Type 2 diabetes Hypertension Statin intolerance-myalgias PLAN: 1.   Mrs. Linskey has a mixed dyslipidemia with a very high LDL cholesterol.  This could be a familial hyperlipidemia although there is not a clear genetic inheritance pattern.  She has had prior stroke and her target LDL is than 55 if achievable.  Unfortunately she could not tolerate statins.  She was recently started on ezetimibe however at this point we have not had enough time to see the reduction in her cholesterol.  She will unlikely however reach target on this dose.  Additional therapy will be warranted and would recommend a PCSK9 inhibitor.  We will reach out for prior authorization for that.  Plan follow-up with me in about 3 to 4 months with a lipid NMR and LP(a). Provider established cholesterol goals reviewed Counseled on importance of regular laboratory monitoring as prescribed Reviewed importance of limiting foods high in cholesterol Reviewed exercise goals and target of 150 minutes per week Discussed plans with patient  for ongoing care management follow up and provided patient with direct contact information for care management team Lipid Panel     Component Value Date/Time   CHOL 153  02/05/2022 0716   TRIG 159 (H) 02/05/2022 0716   HDL 44 02/05/2022 0716   CHOLHDL 3.5 02/05/2022 0716   CHOLHDL 7.1 02/27/2020 0234   VLDL 37 02/27/2020 0234   LDLCALC 82 02/05/2022 0716   LABVLDL 27 02/05/2022 0716      Sinus Bradycardia Interventions:  (Status:  Goal Met.)  Short Term Goal Evaluation of current treatment plan related to  Sinus Bradycardia , self-management and patient's adherence to plan as established by provider Review of patient status, including review of consultant's reports, relevant laboratory and other test results, and medications completed. Reviewed medications with patient and discussed importance of medication adherence Reviewed scheduled/upcoming provider appointment including: next Cardiology follow up appointment with Dr. Debara Pickett scheduled for 06/12/22 _0 :30 AM Instructed patient to notify her doctor of new symptoms or concerns   Right upper quadrant abdominal pain Interventions: (Status: Goal Met) Short Term Goal  Evaluation of current treatment plan related to Right upper quadrant abdominal pain, self-management and patient's adherence to plan as established by provider Determined patient completed her abdominal US with no abnormal findings Determined patients symptoms have resolved and she does not needs to f/u with GI unless symptoms reoccur Instructed patient to notify her doctor of new symptoms or concern  COVID 19 Interventions: (Status:  Goal Met.)  Short Term Goal Evaluation of current treatment plan related to  COVID 19 Infection , self-management and patient's adherence to plan as established by provider Discussed and reviewed recent PCP follow up on recent COVID 19 infection and other chronic disease states Determined patient feels she has recovered from [place] 19 although she continues to suffer from post nasal drip causing occasional cough Reviewed medications with patient and discussed importance of medication adherence Educated patient on CDC  recommendations for COVID 19 booster vaccine and when to receive the next booster per these guidelines  Encouraged patient to continue to follow precautions against COVID 19 as directed  Discussed plans with patient for ongoing care management follow up and provided patient with direct contact information for care management team   Patient Goals/Self-Care Activities: Take all medications as prescribed Attend all scheduled provider appointments Call pharmacy for medication refills 3-7 days in advance of running out of medications Perform IADL's (shopping, preparing meals, housekeeping, managing finances) independently Call provider office for new concerns or questions  adhere to prescribed diet: low trans/Saturated fat develop an exercise routine  Follow Up Plan:  Telephone follow up appointment with care management team member scheduled for:  06/23/22     Barb Merino, RN, BSN, CCM Care Management Coordinator [facility]  Direct Phone: [phone]

## 2022-05-13 ENCOUNTER — Telehealth: Payer: Self-pay | Admitting: *Deleted

## 2022-05-13 NOTE — Telephone Encounter (Signed)
   Telephone encounter was:  Unsuccessful.  05/13/2022 Name: Margaret Webster MRN: [id number] DOB: December 29, 1960  Unsuccessful outbound call made today to assist with:   utilities  Outreach Attempt:  2nd Attempt  A HIPAA compliant voice message was left requesting a return call.  Instructed patient to call back at   Instructed patient to call back at [phone]  at their earliest convenience. . [place], Care Management  [phone] [address] , [place] [address] Email : Ashby [email or web address]-moran [email or web address]

## 2022-05-19 ENCOUNTER — Telehealth: Payer: Self-pay | Admitting: *Deleted

## 2022-05-19 NOTE — Telephone Encounter (Signed)
   Telephone encounter was:  Unsuccessful.  05/19/2022 Name: Margaret Webster MRN: [id number] DOB: 1961/01/12  Unsuccessful outbound call made today to assist with:  Financial Difficulties related to utilities  Outreach Attempt:  3rd Attempt.  Referral closed unable to contact patient.  A HIPAA compliant voice message was left requesting a return call.  Instructed patient to call back at   Instructed patient to call back at [phone]  at their earliest convenience. . [place], Care Management  [phone] [address] , [place] [address] Email : Ashby [email or web address]. [email or web address]-moran [email or web address]

## 2022-05-21 ENCOUNTER — Telehealth: Payer: Self-pay | Admitting: *Deleted

## 2022-05-21 DIAGNOSIS — Z800 Family history of malignant neoplasm of digestive organs: Secondary | ICD-10-CM | POA: Diagnosis not present

## 2022-05-21 DIAGNOSIS — R1013 Epigastric pain: Secondary | ICD-10-CM | POA: Diagnosis not present

## 2022-05-21 DIAGNOSIS — K219 Gastro-esophageal reflux disease without esophagitis: Secondary | ICD-10-CM | POA: Diagnosis not present

## 2022-05-21 DIAGNOSIS — Z86010 Personal history of colonic polyps: Secondary | ICD-10-CM | POA: Diagnosis not present

## 2022-05-21 NOTE — Telephone Encounter (Signed)
   Telephone encounter was:  Successful.  05/21/2022 Name: CASSI JENNE MRN: [id number] DOB: 07-19-1961  GEORGI TUEL is a 61 y.o. year old female who is a primary care patient of [name], [place] . The community resource team was consulted for assistance with [place] guide performed the following interventions: Patient provided with information about care guide support team and interviewed to confirm resource needs.  Patient called after phone out of order she has paid up uyilities sent her a list of food banks and also provided app for food banks  Follow Up Plan:  No further follow up planned at this time. The patient has been provided with needed resources.  [place], Care Management  [phone] [address] , [place] [address] Email : Ashby [email or web address]. [email or web address]-moran [email or web address]

## 2022-05-22 LAB — HGB A1C W/O EAG: Hgb A1c MFr Bld: 5.9 % — ABNORMAL HIGH (ref 4.8–5.6)

## 2022-05-22 LAB — SPECIMEN STATUS REPORT

## 2022-05-29 ENCOUNTER — Other Ambulatory Visit: Payer: Self-pay

## 2022-05-29 MED ORDER — CYCLOBENZAPRINE HCL 10 MG PO TABS
10.0000 mg | ORAL_TABLET | Freq: Three times a day (TID) | ORAL | 0 refills | Status: DC | PRN
Start: 2022-05-29 — End: 2023-10-04

## 2022-05-30 ENCOUNTER — Other Ambulatory Visit: Payer: Self-pay | Admitting: Gastroenterology

## 2022-06-09 DIAGNOSIS — M542 Cervicalgia: Secondary | ICD-10-CM | POA: Diagnosis not present

## 2022-06-09 DIAGNOSIS — M533 Sacrococcygeal disorders, not elsewhere classified: Secondary | ICD-10-CM | POA: Diagnosis not present

## 2022-06-09 DIAGNOSIS — M545 Low back pain, unspecified: Secondary | ICD-10-CM | POA: Diagnosis not present

## 2022-06-09 DIAGNOSIS — G8929 Other chronic pain: Secondary | ICD-10-CM | POA: Diagnosis not present

## 2022-06-09 DIAGNOSIS — M79671 Pain in right foot: Secondary | ICD-10-CM | POA: Diagnosis not present

## 2022-06-11 ENCOUNTER — Other Ambulatory Visit: Payer: Self-pay | Admitting: Nurse Practitioner

## 2022-06-11 DIAGNOSIS — Z981 Arthrodesis status: Secondary | ICD-10-CM | POA: Diagnosis not present

## 2022-06-11 DIAGNOSIS — M79671 Pain in right foot: Secondary | ICD-10-CM | POA: Diagnosis not present

## 2022-06-11 DIAGNOSIS — M542 Cervicalgia: Secondary | ICD-10-CM | POA: Diagnosis not present

## 2022-06-11 DIAGNOSIS — M25551 Pain in right hip: Secondary | ICD-10-CM | POA: Diagnosis not present

## 2022-06-11 DIAGNOSIS — M5136 Other intervertebral disc degeneration, lumbar region: Secondary | ICD-10-CM | POA: Diagnosis not present

## 2022-06-11 DIAGNOSIS — Z1231 Encounter for screening mammogram for malignant neoplasm of breast: Secondary | ICD-10-CM

## 2022-06-12 ENCOUNTER — Ambulatory Visit: Payer: Medicare Other | Admitting: Internal Medicine

## 2022-06-16 ENCOUNTER — Ambulatory Visit: Payer: Medicare Other | Admitting: Nurse Practitioner

## 2022-06-19 ENCOUNTER — Ambulatory Visit: Payer: Medicare Other | Admitting: Nurse Practitioner

## 2022-06-23 ENCOUNTER — Ambulatory Visit: Payer: Self-pay

## 2022-06-23 DIAGNOSIS — I10 Essential (primary) hypertension: Secondary | ICD-10-CM

## 2022-06-23 DIAGNOSIS — N1831 Chronic kidney disease, stage 3a: Secondary | ICD-10-CM

## 2022-06-23 DIAGNOSIS — M159 Polyosteoarthritis, unspecified: Secondary | ICD-10-CM

## 2022-06-23 DIAGNOSIS — R7303 Prediabetes: Secondary | ICD-10-CM

## 2022-06-23 NOTE — Progress Notes (Signed)
This encounter was created in error - please disregard.

## 2022-06-23 NOTE — Patient Instructions (Signed)
Visit Information  Thank you for taking time to visit with me today. Please don't hesitate to contact me if I can be of assistance to you.   Following are the goals we discussed today:   Goals Addressed      I need help with transportation       Care Coordination Interventions: Patient reports she has been scammed out of her SSD and her checking account has been compromised as well. She states the Department of Social Services is aware but this can take up to 90 days to resolve. She does not have gas money to get her to an appointment tomorrow for a hip injection. She is requesting assistance with transportation and may benefit from additional resources if available. Sent emergent referral to [facility] requesting assistance  Emailed Matilde Bash with notification of patient's need for transportation for an MD appt scheduled for tomorrow, 06/24/22     Our next appointment is by telephone on 07/07/22 at 1:45 PM   Please call the care guide team at [phone] if you need to cancel or reschedule your appointment.   If you are experiencing a Mental Health or [facility] or need someone to talk to, please call 1-800-273-TALK (toll free, 24 hour hotline)  Patient verbalizes understanding of instructions and care plan provided today and agrees to view in [place]. Active MyChart status and patient understanding of how to access instructions and care plan via MyChart confirmed with patient.     Barb Merino, RN, BSN, CCM Care Management Coordinator [facility] Care Management Direct Phone: [phone]

## 2022-06-23 NOTE — Patient Outreach (Signed)
  Care Coordination   Follow Up Visit Note   06/23/2022 Name: Margaret Webster MRN: [id number] DOB: Dec 19, 1960  Margaret Webster is a 61 y.o. year old female who sees [name], [place] for primary care. I spoke with  Margaret Webster by phone today.  What matters to the patients health and wellness today?  Patient needs help with transportation.     Goals Addressed      I need help with transportation       Care Coordination Interventions: Patient reports she has been scammed out of her SSD and her checking account has been compromised as well. She states the Department of Social Services is aware but this can take up to 90 days to resolve. She does not have gas money to get her to an appointment tomorrow for a hip injection. She is requesting assistance with transportation and may benefit from additional resources if available. Sent emergent referral to [facility] requesting assistance  Emailed Matilde Bash with notification of patient's need for transportation for an MD appt scheduled for tomorrow, 06/24/22    SDOH assessments and interventions completed:  Yes  SDOH Interventions Today    Flowsheet Row Most Recent Value  SDOH Interventions   Transportation Interventions Other (Comment)  [place] referral sent marked urgent priority]        Care Coordination Interventions Activated:  Yes  Care Coordination Interventions:  Yes, provided   Follow up plan: Referral made to [facility] Follow up call scheduled for 07/07/22 '@1'$ :45 PM     Encounter Outcome:  Pt. Visit Completed

## 2022-06-24 DIAGNOSIS — M533 Sacrococcygeal disorders, not elsewhere classified: Secondary | ICD-10-CM | POA: Diagnosis not present

## 2022-07-01 ENCOUNTER — Telehealth: Payer: Self-pay | Admitting: Internal Medicine

## 2022-07-01 ENCOUNTER — Other Ambulatory Visit: Payer: Self-pay | Admitting: Nurse Practitioner

## 2022-07-01 DIAGNOSIS — Z8673 Personal history of transient ischemic attack (TIA), and cerebral infarction without residual deficits: Secondary | ICD-10-CM

## 2022-07-01 DIAGNOSIS — E782 Mixed hyperlipidemia: Secondary | ICD-10-CM

## 2022-07-01 DIAGNOSIS — Z789 Other specified health status: Secondary | ICD-10-CM

## 2022-07-01 NOTE — Telephone Encounter (Signed)
Request Reference Number: [id number]. REPATHA SURE INJ '140MG'$ /ML is approved through 10/26/2022. Your patient may now fill this prescription and it will be covered.

## 2022-07-01 NOTE — Telephone Encounter (Signed)
Received PA request in CMM Submitted 07/01/22 (Key: [id number])

## 2022-07-07 ENCOUNTER — Ambulatory Visit: Payer: Self-pay

## 2022-07-07 NOTE — Patient Outreach (Signed)
  Care Coordination   07/07/2022 Name: Margaret Webster MRN: [id number] DOB: 1960/12/13   Care Coordination Outreach Attempts:  An unsuccessful telephone outreach was attempted for a scheduled appointment today.  Follow Up Plan:  Additional outreach attempts will be made to offer the patient care coordination information and services.   Encounter Outcome:  Pt. Refused  Care Coordination Interventions Activated:  No   Care Coordination Interventions:  No, not indicated    Barb Merino, RN, BSN, CCM Care Management Coordinator [facility] Care Management  Direct Phone: [phone]

## 2022-07-09 ENCOUNTER — Ambulatory Visit
Admission: RE | Admit: 2022-07-09 | Discharge: 2022-07-09 | Disposition: A | Discharge status: Home / Self Care | Payer: Medicare Other | Source: Ambulatory Visit | Attending: Nurse Practitioner | Admitting: Nurse Practitioner

## 2022-07-09 DIAGNOSIS — Z1231 Encounter for screening mammogram for malignant neoplasm of breast: Secondary | ICD-10-CM | POA: Diagnosis not present

## 2022-07-24 ENCOUNTER — Encounter (HOSPITAL_COMMUNITY): Payer: Self-pay | Admitting: Gastroenterology

## 2022-07-24 NOTE — Progress Notes (Signed)
Attempted to obtain medical history via telephone, unable to reach at this time. HIPAA compliant voicemail message left requesting return call to pre surgical testing department. 

## 2022-07-28 ENCOUNTER — Other Ambulatory Visit: Payer: Self-pay

## 2022-07-28 MED ORDER — LOSARTAN POTASSIUM 25 MG PO TABS: 12.5000 mg | ORAL_TABLET | Freq: Every day | ORAL | 1 refills | Status: DC

## 2022-07-29 DIAGNOSIS — E785 Hyperlipidemia, unspecified: Secondary | ICD-10-CM | POA: Diagnosis not present

## 2022-07-30 ENCOUNTER — Encounter (HOSPITAL_COMMUNITY): Payer: Self-pay | Admitting: Gastroenterology

## 2022-07-30 DIAGNOSIS — E119 Type 2 diabetes mellitus without complications: Secondary | ICD-10-CM | POA: Diagnosis not present

## 2022-07-30 DIAGNOSIS — H1045 Other chronic allergic conjunctivitis: Secondary | ICD-10-CM | POA: Diagnosis not present

## 2022-07-30 DIAGNOSIS — G4730 Sleep apnea, unspecified: Secondary | ICD-10-CM | POA: Diagnosis not present

## 2022-07-30 DIAGNOSIS — H04123 Dry eye syndrome of bilateral lacrimal glands: Secondary | ICD-10-CM | POA: Diagnosis not present

## 2022-07-30 DIAGNOSIS — H2513 Age-related nuclear cataract, bilateral: Secondary | ICD-10-CM | POA: Diagnosis not present

## 2022-07-30 DIAGNOSIS — I639 Cerebral infarction, unspecified: Secondary | ICD-10-CM | POA: Diagnosis not present

## 2022-07-30 LAB — NMR, LIPOPROFILE
Cholesterol, Total: 168 mg/dL (ref 100–199)
HDL Particle Number: 48.6 umol/L (ref >=30.5)
HDL-C: 60 mg/dL (ref >39)
LDL Particle Number: 1322 nmol/L — ABNORMAL HIGH (ref <1000)
LDL Size: 20.7 nm (ref >20.5)
LDL-C (NIH Calc): 90 mg/dL (ref 0–99)
LP-IR Score: 50 — ABNORMAL HIGH (ref <=45)
Small LDL Particle Number: 780 nmol/L — ABNORMAL HIGH (ref <=527)
Triglycerides: 100 mg/dL (ref 0–149)

## 2022-07-30 LAB — HM DIABETES EYE EXAM

## 2022-08-01 ENCOUNTER — Other Ambulatory Visit: Payer: Self-pay

## 2022-08-01 ENCOUNTER — Ambulatory Visit (HOSPITAL_BASED_OUTPATIENT_CLINIC_OR_DEPARTMENT_OTHER): Payer: Medicare Other | Admitting: Certified Registered Nurse Anesthetist

## 2022-08-01 ENCOUNTER — Encounter (HOSPITAL_COMMUNITY)
Admission: RE | Disposition: A | Discharge status: Home / Self Care | Payer: Self-pay | Source: Home / Self Care | Attending: Gastroenterology

## 2022-08-01 ENCOUNTER — Ambulatory Visit (HOSPITAL_COMMUNITY)
Admission: RE | Admit: 2022-08-01 | Discharge: 2022-08-01 | Disposition: A | Discharge status: Home / Self Care | Payer: Medicare Other | Attending: Gastroenterology | Admitting: Gastroenterology

## 2022-08-01 ENCOUNTER — Ambulatory Visit (HOSPITAL_COMMUNITY): Payer: Medicare Other | Admitting: Certified Registered Nurse Anesthetist

## 2022-08-01 ENCOUNTER — Encounter (HOSPITAL_COMMUNITY): Payer: Self-pay | Admitting: Gastroenterology

## 2022-08-01 DIAGNOSIS — I129 Hypertensive chronic kidney disease with stage 1 through stage 4 chronic kidney disease, or unspecified chronic kidney disease: Secondary | ICD-10-CM | POA: Diagnosis not present

## 2022-08-01 DIAGNOSIS — Z87891 Personal history of nicotine dependence: Secondary | ICD-10-CM | POA: Diagnosis not present

## 2022-08-01 DIAGNOSIS — N189 Chronic kidney disease, unspecified: Secondary | ICD-10-CM | POA: Diagnosis not present

## 2022-08-01 DIAGNOSIS — F17210 Nicotine dependence, cigarettes, uncomplicated: Secondary | ICD-10-CM | POA: Diagnosis not present

## 2022-08-01 DIAGNOSIS — K869 Disease of pancreas, unspecified: Secondary | ICD-10-CM | POA: Insufficient documentation

## 2022-08-01 DIAGNOSIS — E1122 Type 2 diabetes mellitus with diabetic chronic kidney disease: Secondary | ICD-10-CM

## 2022-08-01 DIAGNOSIS — E119 Type 2 diabetes mellitus without complications: Secondary | ICD-10-CM | POA: Insufficient documentation

## 2022-08-01 DIAGNOSIS — K5710 Diverticulosis of small intestine without perforation or abscess without bleeding: Secondary | ICD-10-CM | POA: Diagnosis not present

## 2022-08-01 DIAGNOSIS — N183 Chronic kidney disease, stage 3 unspecified: Secondary | ICD-10-CM

## 2022-08-01 DIAGNOSIS — E039 Hypothyroidism, unspecified: Secondary | ICD-10-CM | POA: Diagnosis not present

## 2022-08-01 DIAGNOSIS — Z8673 Personal history of transient ischemic attack (TIA), and cerebral infarction without residual deficits: Secondary | ICD-10-CM | POA: Insufficient documentation

## 2022-08-01 DIAGNOSIS — Z800 Family history of malignant neoplasm of digestive organs: Secondary | ICD-10-CM | POA: Insufficient documentation

## 2022-08-01 DIAGNOSIS — Z7984 Long term (current) use of oral hypoglycemic drugs: Secondary | ICD-10-CM | POA: Diagnosis not present

## 2022-08-01 DIAGNOSIS — R1013 Epigastric pain: Secondary | ICD-10-CM | POA: Diagnosis not present

## 2022-08-01 HISTORY — PX: UPPER ESOPHAGEAL ENDOSCOPIC ULTRASOUND (EUS): SHX6562

## 2022-08-01 HISTORY — PX: ESOPHAGOGASTRODUODENOSCOPY (EGD) WITH PROPOFOL: SHX5813

## 2022-08-01 LAB — GLUCOSE, CAPILLARY
Glucose-Capillary: 89 mg/dL (ref 70–99)
Glucose-Capillary: 86 mg/dL (ref 70–99)

## 2022-08-01 SURGERY — UPPER ESOPHAGEAL ENDOSCOPIC ULTRASOUND (EUS)
Anesthesia: Monitor Anesthesia Care

## 2022-08-01 MED ORDER — PROPOFOL 500 MG/50ML IV EMUL
INTRAVENOUS | Status: AC
  Filled 2022-08-01: qty 100

## 2022-08-01 MED ORDER — PROPOFOL 500 MG/50ML IV EMUL
INTRAVENOUS | Status: DC | PRN
  Administered 2022-08-01: 125 ug/kg/min via INTRAVENOUS

## 2022-08-01 MED ORDER — LACTATED RINGERS IV SOLN: INTRAVENOUS | Status: DC

## 2022-08-01 MED ORDER — SODIUM CHLORIDE 0.9 % IV SOLN: INTRAVENOUS | Status: DC

## 2022-08-01 MED ORDER — PROPOFOL 10 MG/ML IV BOLUS
INTRAVENOUS | Status: DC | PRN
  Administered 2022-08-01: 200 mg via INTRAVENOUS
  Administered 2022-08-01: 30 mg via INTRAVENOUS

## 2022-08-01 MED ORDER — SUCCINYLCHOLINE CHLORIDE 200 MG/10ML IV SOSY
PREFILLED_SYRINGE | INTRAVENOUS | Status: DC | PRN
  Administered 2022-08-01: 160 mg via INTRAVENOUS

## 2022-08-01 MED ORDER — DEXAMETHASONE SODIUM PHOSPHATE 10 MG/ML IJ SOLN
INTRAMUSCULAR | Status: DC | PRN
  Administered 2022-08-01: 8 mg via INTRAVENOUS

## 2022-08-01 MED ORDER — GLYCOPYRROLATE 0.2 MG/ML IJ SOLN
INTRAMUSCULAR | Status: DC | PRN
  Administered 2022-08-01: .2 mg via INTRAVENOUS

## 2022-08-01 MED ORDER — LIDOCAINE 2% (20 MG/ML) 5 ML SYRINGE
INTRAMUSCULAR | Status: DC | PRN
  Administered 2022-08-01: 100 mg via INTRAVENOUS

## 2022-08-01 MED ORDER — ONDANSETRON HCL 4 MG/2ML IJ SOLN
INTRAMUSCULAR | Status: DC | PRN
  Administered 2022-08-01: 4 mg via INTRAVENOUS

## 2022-08-01 NOTE — Anesthesia Postprocedure Evaluation (Signed)
Anesthesia Post Note  Patient: MEGGIE LASETER  Procedure(s) Performed: UPPER ESOPHAGEAL ENDOSCOPIC ULTRASOUND (EUS)     Patient location during evaluation: PACU Anesthesia Type: General Level of consciousness: awake and alert Pain management: pain level controlled Vital Signs Assessment: post-procedure vital signs reviewed and stable Respiratory status: spontaneous breathing, nonlabored ventilation, respiratory function stable and patient connected to nasal cannula oxygen Cardiovascular status: blood pressure returned to baseline and stable Postop Assessment: no apparent nausea or vomiting Anesthetic complications: no Comments: Converted to GA due to repeated airway obstruction.   No notable events documented.  Last Vitals:  Vitals:   08/01/22 0920 08/01/22 0930  BP: (!) 162/76 (!) 183/88  Pulse: 66 60  Resp: 19 15  Temp:    SpO2: 98% 92%    Last Pain:  Vitals:   08/01/22 0920  TempSrc:   PainSc: [place] Jamil [place]

## 2022-08-01 NOTE — Anesthesia Procedure Notes (Addendum)
Procedure Name: Intubation Date/Time: 08/01/2022 8:33 AM  Performed by: West Pugh, CRNAPre-anesthesia Checklist: Patient identified, Emergency Drugs available, Suction available, Patient being monitored and Timeout performed Patient Re-evaluated:Patient Re-evaluated prior to induction Oxygen Delivery Method: Circle system utilized Preoxygenation: Pre-oxygenation with 100% oxygen Induction Type: IV induction Laryngoscope Size: Mac and 4 Grade View: Grade I Tube type: Oral Number of attempts: 1 Airway Equipment and Method: Stylet Placement Confirmation: ETT inserted through vocal cords under direct vision, positive ETCO2, CO2 detector and breath sounds checked- equal and bilateral Secured at: 22 cm Tube secured with: Tape Dental Injury: Teeth and Oropharynx as per pre-operative assessment

## 2022-08-01 NOTE — Anesthesia Procedure Notes (Addendum)
Procedure Name: MAC Date/Time: 08/01/2022 8:12 AM  Performed by: West Pugh, CRNAPre-anesthesia Checklist: Patient identified, Emergency Drugs available, Suction available, Patient being monitored and Timeout performed Patient Re-evaluated:Patient Re-evaluated prior to induction Oxygen Delivery Method: Simple face mask Preoxygenation: Pre-oxygenation with 100% oxygen Induction Type: IV induction Placement Confirmation: positive ETCO2 Dental Injury: Teeth and Oropharynx as per pre-operative assessment  Comments: pom

## 2022-08-01 NOTE — Discharge Instructions (Signed)

## 2022-08-01 NOTE — Anesthesia Preprocedure Evaluation (Addendum)
Anesthesia Evaluation  Patient identified by MRN, date of birth, ID band Patient awake    Reviewed: Allergy & Precautions, NPO status , Patient's Chart, lab work & pertinent test results  Airway Mallampati: I  TM Distance: >3 FB Neck ROM: Full    Dental  (+) Teeth Intact, Dental Advisory Given   Pulmonary Current Smoker and Patient abstained from smoking.,    breath sounds clear to auscultation       Cardiovascular hypertension, Pt. on medications  Rhythm:Regular Rate:Normal     Neuro/Psych  Headaches, PSYCHIATRIC DISORDERS Depression  Neuromuscular disease CVA, Residual Symptoms    GI/Hepatic negative GI ROS, Neg liver ROS,   Endo/Other  diabetes, Type 2, Oral Hypoglycemic AgentsHypothyroidism   Renal/GU CRFRenal disease     Musculoskeletal  (+) Arthritis ,   Abdominal   Peds  Hematology negative hematology ROS (+)   Anesthesia Other Findings   Reproductive/Obstetrics                           Anesthesia Physical Anesthesia Plan  ASA: 3  Anesthesia Plan: MAC   Post-op Pain Management:    Induction: Intravenous  PONV Risk Score and Plan: 0 and Propofol infusion  Airway Management Planned: Natural Airway and Simple Face Mask  Additional Equipment: None  Intra-op Plan:   Post-operative Plan:   Informed Consent: I have reviewed the patients History and Physical, chart, labs and discussed the procedure including the risks, benefits and alternatives for the proposed anesthesia with the patient or authorized representative who has indicated his/her understanding and acceptance.       Plan Discussed with: CRNA  Anesthesia Plan Comments:        Anesthesia Quick Evaluation

## 2022-08-01 NOTE — Transfer of Care (Signed)
Immediate Anesthesia Transfer of Care Note  Patient: Margaret Webster  Procedure(s) Performed: UPPER ESOPHAGEAL ENDOSCOPIC ULTRASOUND (EUS)  Patient Location: PACU and Endoscopy Unit  Anesthesia Type:General  Level of Consciousness: drowsy and patient cooperative  Airway & Oxygen Therapy: Patient Spontanous Breathing and Patient connected to face mask oxygen  Post-op Assessment: Report given to RN and Post -op Vital signs reviewed and stable  Post vital signs: Reviewed and stable  Last Vitals:  Vitals Value Taken Time  BP 163/58 08/01/22 0900  Temp    Pulse 61 08/01/22 0900  Resp 19 08/01/22 0900  SpO2 100 % 08/01/22 0900  Vitals shown include unvalidated device data.  Last Pain:  Vitals:   08/01/22 0711  TempSrc: Temporal  PainSc: 5          Complications: No notable events documented.

## 2022-08-01 NOTE — Op Note (Signed)
[facility] Patient Name: Margaret Webster Procedure Date: 08/01/2022 MRN: [id number] Attending MD: Carol Ada , MD Date of Birth: [birth year] CSN: [id number] Age: 61 Admit Type: Outpatient Procedure:                Upper EUS Indications:              Epigastric abdominal pain Providers:                Carol Ada, MD, Doristine Johns, RN, Orvil Feil, RN, William Dalton, Technician Referring MD:              Medicines:                General Anesthesia Complications:            No immediate complications. Estimated Blood Loss:     Estimated blood loss: none. Procedure:                Pre-Anesthesia Assessment:                           - Prior to the procedure, a History and Physical                            was performed, and patient medications and                            allergies were reviewed. The patient's tolerance of                            previous anesthesia was also reviewed. The risks                            and benefits of the procedure and the sedation                            options and risks were discussed with the patient.                            All questions were answered, and informed consent                            was obtained. Prior Anticoagulants: The patient has                            taken no previous anticoagulant or antiplatelet                            agents. ASA Grade Assessment: III - A patient with                            severe systemic disease. After reviewing the risks  and benefits, the patient was deemed in                            satisfactory condition to undergo the procedure.                           - Sedation was administered by an anesthesia                            professional. General anesthesia was attained.                           After obtaining informed consent, the endoscope was                            passed under direct  vision. Throughout the                            procedure, the patient's blood pressure, pulse, and                            oxygen saturations were monitored continuously. The                            [id number] ([id number]) Olympus linear ultrasound scope                            was introduced through the mouth, and advanced to                            the second part of duodenum. The upper EUS was                            technically difficult and complex due to the                            patient's respiratory instability (hypoxia).                            Successful completion of the procedure was aided by                            treating with ventilation. The patient tolerated                            the procedure well. Scope In: Scope Out: Findings:      ENDOSCOPIC FINDING: :      The examined esophagus was endoscopically normal.      The entire examined stomach was endoscopically normal.      A non-bleeding diverticulum was found in the duodenal bulb.      ENDOSONOGRAPHIC FINDING: :      Pancreatic parenchymal abnormalities were noted in the entire pancreas.       These consisted of hyperechoic strands.      There was no sign of significant  endosonographic abnormality in the       common bile duct and in the gallbladder. The maximum diameter of the       ducts were 2 mm.      During the examination the patient developed obstruction. Her anesthesia       was converted from [place] to general anesthesia. Examination of the       pancreas was negative for any masses or suspicious lesions. The entire       pancreas did exhibit hyperechoic strands. Impression:               - Normal esophagus.                           - Normal stomach.                           - Non-bleeding duodenal diverticulum.                           - Pancreatic parenchymal abnormalities consisting                            of hyperechoic strands were noted in the entire                             pancreas.                           - There was no sign of significant pathology in the                            common bile duct and in the gallbladder.                           - No specimens collected. Moderate Sedation:      Not Applicable - Patient had care per Anesthesia. Recommendation:           - Patient has a contact number available for                            emergencies. The signs and symptoms of potential                            delayed complications were discussed with the                            patient. Return to normal activities tomorrow.                            Written discharge instructions were provided to the                            patient.                           - Resume previous diet.                           -  Stop smoking. Procedure Code(s):        --- Professional ---                           [id number], Esophagogastroduodenoscopy, flexible,                            transoral; with endoscopic ultrasound examination                            limited to the esophagus, stomach or duodenum, and                            adjacent structures Diagnosis Code(s):        --- Professional ---                           K86.9, Disease of pancreas, unspecified                           R10.13, Epigastric pain                           K57.10, Diverticulosis of small intestine without                            perforation or abscess without bleeding CPT copyright 2019 American Medical Association. All rights reserved. The codes documented in this report are preliminary and upon coder review may  be revised to meet current compliance requirements. Carol Ada, MD Carol Ada, MD 08/01/2022 8:55:50 AM This report has been signed electronically. Number of Addenda: 0

## 2022-08-01 NOTE — H&P (Signed)
Margaret Webster HPI:  The patient's last colonoscopy was 06/2020 and it only showed one adenoma.  The original recommendation was to have a follow up in 5 years.  Currently she does not report any problems with hematochezia or melena.  She does have a family history of pancreatic cancer in an older sister at the age of 34.  The patient was also evaluated and treated for H. pylori in the past.   Past Medical History:  Diagnosis Date   Arthritis    bil shoulders   Bronchitis    Chronic back pain    Chronic kidney disease    Diabetes mellitus    stopped Mrtformin 1 mo ago, made her "sick"   Fatty liver    Hypertension    Hypothyroidism    Stroke [facility])    either 2019/2020   Thyroid disease    Trigger thumb of right hand     Past Surgical History:  Procedure Laterality Date   [place]  08/2015   CARPAL TUNNEL RELEASE Left 09/10/2017   Procedure: CARPAL TUNNEL RELEASE;  Surgeon: Leanora Cover, MD;  Location: [place];  Service: Orthopedics;  Laterality: Left;   CERVICAL SPINE SURGERY Right 11/2012   OOPHORECTOMY     TRIGGER FINGER RELEASE Right 08/06/2017   Procedure: RIGHT THUMB TRIGGER RELEASE;  Surgeon: Leanora Cover, MD;  Location: [place];  Service: Orthopedics;  Laterality: Right;   ULNAR COLLATERAL LIGAMENT REPAIR Right 03/21/2021   Procedure: RIGHT THUMB ULNAR COLLATERAL LIGAMENT REPAIR;  Surgeon: Leanora Cover, MD;  Location: [place];  Service: Orthopedics;  Laterality: Right;    Family History  Problem Relation Age of Onset   Diabetes Mother    Cystic fibrosis Sister    Cancer Sister        ?   Breast cancer Maternal Aunt    Heart disease Maternal Grandmother    Clotting disorder Maternal Grandfather     Social History:  reports that she has been smoking cigarettes. She has a 11.25 pack-year smoking history. She has never used smokeless tobacco. She reports that she does not currently  use alcohol. She reports that she does not currently use drugs after having used the following drugs: Marijuana.  Allergies:  Allergies  Allergen Reactions   Sulfa Antibiotics Other (See Comments)    Hard to breathe   Crestor [Rosuvastatin] Other (See Comments)    Makes her mouth numb and causes side pain   Ibuprofen Other (See Comments)    Stomach upset   Polytrim [Polymyxin B-Trimethoprim] Itching and Swelling    redness   Influenza Vaccines Hives   Lipitor [Atorvastatin Calcium] Other (See Comments)    Muscle ache    Naproxen Nausea And Vomiting    Medications: Scheduled: Continuous:  sodium chloride     lactated ringers 10 mL/hr at 08/01/22 9833    Results for orders placed or performed during the hospital encounter of 08/01/22 (from the past 24 hour(s))  Glucose, capillary     Status: None   Collection Time: 08/01/22  7:18 AM  Result Value Ref Range   Glucose-Capillary 89 70 - 99 mg/dL     No results found.  ROS:  As stated above in the HPI otherwise negative.  Blood pressure (!) 149/77, temperature (!) 97.2 F (36.2 C), temperature source Temporal, resp. rate 16, height '5\' 7"'$  (1.702 m), weight 101.2 kg, SpO2 100 %.    PE:  Gen: NAD, Alert and Oriented HEENT:  [place]/AT, EOMI Neck: Supple, no LAD Lungs: CTA Bilaterally CV: RRR without M/G/R ABD: Soft, NTND, +BS Ext: No C/C/E  Assessment/Plan: 1) Family history of pancreatic cancer - EUS.  Margaret Webster 08/01/2022, 7:55 AM

## 2022-08-04 ENCOUNTER — Encounter (HOSPITAL_COMMUNITY): Payer: Self-pay | Admitting: Gastroenterology

## 2022-08-12 ENCOUNTER — Ambulatory Visit: Payer: Medicare Other

## 2022-08-14 ENCOUNTER — Encounter: Payer: Self-pay | Admitting: Internal Medicine

## 2022-08-14 ENCOUNTER — Ambulatory Visit: Payer: Medicare Other

## 2022-08-14 DIAGNOSIS — M7061 Trochanteric bursitis, right hip: Secondary | ICD-10-CM | POA: Diagnosis not present

## 2022-08-26 ENCOUNTER — Ambulatory Visit: Payer: Medicare Other

## 2022-08-27 DIAGNOSIS — M7061 Trochanteric bursitis, right hip: Secondary | ICD-10-CM | POA: Diagnosis not present

## 2022-08-28 ENCOUNTER — Encounter: Payer: Medicare Other | Admitting: Nurse Practitioner

## 2022-08-28 NOTE — Progress Notes (Unsigned)
Patient presents today for second shingrix.   I have provided oversight concerning  ,CMA evaluation and treatment of this patient's health issues addressed during today's encounter. I agree with the assessment and plan as outlined in the note above.   [name], DNP, FNP-BC [facility]

## 2022-09-25 ENCOUNTER — Other Ambulatory Visit: Payer: Self-pay | Admitting: Nurse Practitioner

## 2022-09-25 DIAGNOSIS — R7303 Prediabetes: Secondary | ICD-10-CM

## 2022-09-30 ENCOUNTER — Other Ambulatory Visit (HOSPITAL_COMMUNITY): Payer: Self-pay

## 2022-10-14 ENCOUNTER — Telehealth: Payer: Self-pay | Admitting: Internal Medicine

## 2022-10-14 NOTE — Telephone Encounter (Signed)
PA for repatha submitted via CMM (Key: [id number])

## 2022-10-17 ENCOUNTER — Ambulatory Visit: Payer: Medicare Other | Attending: Internal Medicine | Admitting: Internal Medicine

## 2022-10-17 VITALS — BP 148/87 | HR 69 | Ht 67.0 in | Wt 226.8 lb

## 2022-10-17 DIAGNOSIS — I739 Peripheral vascular disease, unspecified: Secondary | ICD-10-CM

## 2022-10-17 DIAGNOSIS — E785 Hyperlipidemia, unspecified: Secondary | ICD-10-CM | POA: Diagnosis not present

## 2022-10-17 DIAGNOSIS — E1159 Type 2 diabetes mellitus with other circulatory complications: Secondary | ICD-10-CM | POA: Diagnosis not present

## 2022-10-17 NOTE — Patient Instructions (Signed)
Medication Instructions:  Your physician recommends that you continue on your current medications as directed. Please refer to the Current Medication list given to you today.  *If you need a refill on your cardiac medications before your next appointment, please call your pharmacy*   Lab Work: Your physician recommends that you return for lab work in: 6 months for FASTING lipid panel  If you have labs (blood work) drawn today and your tests are completely normal, you will receive your results only by: [place] (if you have MyChart) OR A paper copy in the mail If you have any lab test that is abnormal or we need to change your treatment, we will call you to review the results.   Follow-Up: At [facility], you and your health needs are our priority.  As part of our continuing mission to provide you with exceptional heart care, we have created designated Provider Care Teams.  These Care Teams include your primary Cardiologist (physician) and Advanced Practice Providers (APPs -  Physician Assistants and Nurse Practitioners) who all work together to provide you with the care you need, when you need it.  We recommend signing up for the patient portal called "MyChart".  Sign up information is provided on this After Visit Summary.  MyChart is used to connect with patients for Virtual Visits (Telemedicine).  Patients are able to view lab/test results, encounter notes, upcoming appointments, etc.  Non-urgent messages can be sent to your provider as well.   To learn more about what you can do with MyChart, go to [email or web address].    Your next appointment:   6 month(s)  The format for your next appointment:   In Person  Provider:   K. Mali Hilty, MD

## 2022-10-17 NOTE — Progress Notes (Signed)
LIPID CLINIC CONSULT NOTE  Chief Complaint:  Follow-up dyslipidemia  Primary Care Physician: [name], FNP  Primary Cardiologist:  Werner Lean, MD  HPI:  Margaret Webster is a 61 y.o. female who is being seen today for the evaluation of dyslipidemia at the request of [name], [place].  This is a pleasant 61 year old female kindly referred for evaluation management of dyslipidemia. She has been seen recently by Dr. Gasper Sells for bradycardia.  She has a notable history of prior stroke, diabetes, hypertension, chronic kidney disease and other medical problems and he recommended her starting on ezetimibe because of prior statin intolerance including issues with rosuvastatin and atorvastatin both causing myalgias.  Her most recent lipid profile in January 2023 showed total cholesterol 329, HDL 52, triglycerides 177 and LDL 242.  There is remote history of family heart disease but no clear high cholesterol or family heart disease in her parents.  10/17/2022  Margaret Webster is seen today in follow-up.  She has had further significant improvement in her lipids.  Labs in October showed LDL particle number of 1322 with LDL-C of 90, HDL-C of 60 and triglycerides 100.  Small LDL particle number was mildly elevated at 780.  She reports compliance mostly with her medications but has had some difficulty remembering her Repatha dosing and sometimes gets off schedule.  She says she has worked to improve that recently and her numbers may be somewhat better.  She is also on Nexletol and ezetimibe.  Is interested in weight loss which I think would be helpful and will reach out to her PCP about options with the new GLP-1 agonists.  PMHx:  Past Medical History:  Diagnosis Date   Arthritis    bil shoulders   Bronchitis    Chronic back pain    Chronic kidney disease    Diabetes mellitus    stopped Mrtformin 1 mo ago, made her "sick"   Fatty liver    Hypertension    Hypothyroidism    Stroke  [facility])    either 2019/2020   Thyroid disease    Trigger thumb of right hand     Past Surgical History:  Procedure Laterality Date   BACK SURGERY  1993   CARPAL TUNNEL RELEASE  08/2015   CARPAL TUNNEL RELEASE Left 09/10/2017   Procedure: CARPAL TUNNEL RELEASE;  Surgeon: Leanora Cover, MD;  Location: [place];  Service: Orthopedics;  Laterality: Left;   CERVICAL SPINE SURGERY Right 11/2012   ESOPHAGOGASTRODUODENOSCOPY (EGD) WITH PROPOFOL N/A 08/01/2022   Procedure: ESOPHAGOGASTRODUODENOSCOPY (EGD) WITH PROPOFOL;  Surgeon: Carol Ada, MD;  Location: [facility];  Service: Gastroenterology;  Laterality: N/A;   OOPHORECTOMY     TRIGGER FINGER RELEASE Right 08/06/2017   Procedure: RIGHT THUMB TRIGGER RELEASE;  Surgeon: Leanora Cover, MD;  Location: [place];  Service: Orthopedics;  Laterality: Right;   ULNAR COLLATERAL LIGAMENT REPAIR Right 03/21/2021   Procedure: RIGHT THUMB ULNAR COLLATERAL LIGAMENT REPAIR;  Surgeon: Leanora Cover, MD;  Location: [place];  Service: Orthopedics;  Laterality: Right;   UPPER ESOPHAGEAL ENDOSCOPIC ULTRASOUND (EUS) N/A 08/01/2022   Procedure: UPPER ESOPHAGEAL ENDOSCOPIC ULTRASOUND (EUS);  Surgeon: Carol Ada, MD;  Location: [facility];  Service: Gastroenterology;  Laterality: N/A;    FAMHx:  Family History  Problem Relation Age of Onset   Diabetes Mother    Cystic fibrosis Sister    Cancer Sister        ?   Breast cancer Maternal Aunt  Heart disease Maternal Grandmother    Clotting disorder Maternal Grandfather     SOCHx:   reports that she has been smoking cigarettes. She has a 11.25 pack-year smoking history. She has never used smokeless tobacco. She reports that she does not currently use alcohol. She reports that she does not currently use drugs after having used the following drugs: Marijuana.  ALLERGIES:  Allergies  Allergen Reactions   Sulfa Antibiotics Other (See Comments)    Hard to  breathe   Crestor [Rosuvastatin] Other (See Comments)    Makes her mouth numb and causes side pain   Ibuprofen Other (See Comments)    Stomach upset   Polytrim [Polymyxin B-Trimethoprim] Itching and Swelling    redness   Influenza Vaccines Hives   Lipitor [Atorvastatin Calcium] Other (See Comments)    Muscle ache    Naproxen Nausea And Vomiting    ROS: Pertinent items noted in HPI and remainder of comprehensive ROS otherwise negative.  HOME MEDS: Current Outpatient Medications on File Prior to Visit  Medication Sig Dispense Refill   acetaminophen (TYLENOL) 650 MG CR tablet Take 650 mg by mouth every 8 (eight) hours as needed for pain.     amLODipine (NORVASC) 2.5 MG tablet TAKE 1 TABLET BY MOUTH ONCE  DAILY 100 tablet 2   amoxicillin-clavulanate (AUGMENTIN) 500-125 MG tablet Take 1 tablet (500 mg total) by mouth 3 (three) times daily. 14 tablet 0   aspirin EC 81 MG tablet Take 81 mg by mouth in the morning.     cyclobenzaprine (FLEXERIL) 10 MG tablet Take 1 tablet (10 mg total) by mouth 3 (three) times daily as needed for muscle spasms. 30 tablet 0   diclofenac Sodium (VOLTAREN) 1 % GEL Apply topically 4 (four) times daily as needed (pain.).     DULoxetine (CYMBALTA) 30 MG capsule Take 60 mg by mouth daily at 2 PM.     Evolocumab (REPATHA SURECLICK) 867 MG/ML SOAJ Inject 1 Dose into the skin every 14 (fourteen) days. 2 mL 11   ezetimibe (ZETIA) 10 MG tablet Take 1 tablet (10 mg total) by mouth daily. 90 tablet 3   losartan (COZAAR) 25 MG tablet Take 0.5 tablets (12.5 mg total) by mouth daily. (Patient taking differently: Take 25 mg by mouth in the morning.) 90 tablet 1   metFORMIN (GLUCOPHAGE-XR) 500 MG 24 hr tablet TAKE 1 TABLET BY MOUTH ONCE  DAILY WITH BREAKFAST 100 tablet 2   NEXLETOL 180 MG TABS TAKE 1 TABLET BY MOUTH AT  BEDTIME (Patient taking differently: Take 1 tablet by mouth in the morning.) 100 tablet 2   triamcinolone cream (KENALOG) 0.5 % Apply 1 application topically 2  (two) times daily. (Patient taking differently: Apply 1 application  topically 2 (two) times daily as needed (skin irritation.).) 30 g 0   Vitamin D, Ergocalciferol, (DRISDOL) 1.25 MG (50000 UNIT) CAPS capsule Take 1 capsule (50,000 Units total) by mouth every 7 (seven) days. (Patient taking differently: Take 50,000 Units by mouth every Monday.) 12 capsule 1   No current facility-administered medications on file prior to visit.    LABS/IMAGING: No results found for this or any previous visit (from the past 48 hour(s)). No results found.  LIPID PANEL:    Component Value Date/Time   CHOL 153 02/05/2022 0716   TRIG 159 (H) 02/05/2022 0716   HDL 44 02/05/2022 0716   CHOLHDL 3.5 02/05/2022 0716   CHOLHDL 7.1 02/27/2020 0234   VLDL 37 02/27/2020 0234   LDLCALC 82  02/05/2022 0716    WEIGHTS: Wt Readings from Last 3 Encounters:  10/17/22 226 lb 12.8 oz (102.9 kg)  08/01/22 223 lb (101.2 kg)  05/08/22 218 lb (98.9 kg)    VITALS: BP (!) 148/87 (BP Location: Left Arm, Patient Position: Sitting, Cuff Size: Large)   Pulse 69   Ht '5\' 7"'$  (1.702 m)   Wt 226 lb 12.8 oz (102.9 kg)   SpO2 98%   BMI 35.52 kg/m   EXAM: General appearance: alert and no distress Neck: no carotid bruit, no JVD, and thyroid not enlarged, symmetric, no tenderness/mass/nodules Lungs: clear to auscultation bilaterally Heart: regular rate and rhythm, S1, S2 normal, no murmur, click, rub or gallop Abdomen: soft, non-tender; bowel sounds normal; no masses,  no organomegaly Extremities: extremities normal, atraumatic, no cyanosis or edema Pulses: 2+ and symmetric Skin: Skin color, texture, turgor normal. No rashes or lesions Neurologic: Grossly normal Psych: Pleasant  EKG: Deferred  ASSESSMENT: Mixed dyslipidemia with LDL greater than 190,?  Familial hyperlipidemia History of stroke Type 2 diabetes Hypertension Statin intolerance-myalgias Moderate obesity  PLAN: 1.   Margaret Webster has had significant  improvement in her lipids with LDL down to 90 on current therapies.  She reports some variable compliance with her Repatha but most of this seems to be mitigated with retiming the doses.  She is working to be better and keeping it every 2 weeks schedule.  I would recommend trying to repeat her lipids in about 6 months.  She is also working on further weight reduction which will help and might be a candidate for GLP 1 agonists.  I advised her to follow-up with her PCP regarding this.  Follow-up with me in 6 months.  Pixie Casino, MD, [facility], [place] Director of the Advanced Lipid Disorders &  Cardiovascular Risk Reduction Clinic Diplomate of the American Board of Clinical Lipidology Attending Cardiologist  Direct Dial: [phone]  Fax: [phone]  Website:  [email or web address] 10/17/2022, 10:43 AM

## 2022-10-21 ENCOUNTER — Other Ambulatory Visit: Payer: Self-pay | Admitting: Nurse Practitioner

## 2022-10-21 DIAGNOSIS — E559 Vitamin D deficiency, unspecified: Secondary | ICD-10-CM

## 2022-11-08 ENCOUNTER — Encounter (HOSPITAL_COMMUNITY): Payer: Self-pay | Admitting: Emergency Medicine

## 2022-11-08 ENCOUNTER — Ambulatory Visit (HOSPITAL_COMMUNITY)
Admission: EM | Admit: 2022-11-08 | Discharge: 2022-11-08 | Disposition: A | Discharge status: Home / Self Care | Payer: 59 | Attending: Family Medicine | Admitting: Family Medicine

## 2022-11-08 DIAGNOSIS — K0889 Other specified disorders of teeth and supporting structures: Secondary | ICD-10-CM

## 2022-11-08 DIAGNOSIS — R051 Acute cough: Secondary | ICD-10-CM | POA: Diagnosis not present

## 2022-11-08 MED ORDER — AMOXICILLIN-POT CLAVULANATE 875-125 MG PO TABS: 1.0000 | ORAL_TABLET | Freq: Two times a day (BID) | ORAL | 0 refills | Status: DC

## 2022-11-08 MED ORDER — PROMETHAZINE-DM 6.25-15 MG/5ML PO SYRP: 5.0000 mL | ORAL_SOLUTION | Freq: Four times a day (QID) | ORAL | 0 refills | Status: DC | PRN

## 2022-11-08 MED ORDER — ACETAMINOPHEN 500 MG PO TABS: 500.0000 mg | ORAL_TABLET | Freq: Four times a day (QID) | ORAL | 0 refills | Status: AC | PRN

## 2022-11-08 NOTE — ED Triage Notes (Signed)
Pt reports left lower dental pain x 2 weeks, a cough since Tuesday and headache today. Also reports left ear pain and watery and itching feeling in the left eye.

## 2022-11-08 NOTE — ED Provider Notes (Signed)
[place]   [id number] 11/08/22 Arrival Time: 1287  ASSESSMENT & PLAN:  1. Pain, dental   2. Acute cough    No sign of abscess requiring I&D at this time. Discussed.  Meds ordered this encounter  Medications   amoxicillin-clavulanate (AUGMENTIN) 875-125 MG tablet    Sig: Take 1 tablet by mouth every 12 (twelve) hours.    Dispense:  20 tablet    Refill:  0   promethazine-dextromethorphan (PROMETHAZINE-DM) 6.25-15 MG/5ML syrup    Sig: Take 5 mLs by mouth 4 (four) times daily as needed for cough.    Dispense:  118 mL    Refill:  0   acetaminophen (TYLENOL) 500 MG tablet    Sig: Take 1 tablet (500 mg total) by mouth every 6 (six) hours as needed.    Dispense:  30 tablet    Refill:  0   Couple days of cough likely early URI symptoms. Explained. Has dentist.  Reviewed expectations re: course of current medical issues. Questions answered. Outlined signs and symptoms indicating need for more acute intervention. Patient verbalized understanding. After Visit Summary given.   SUBJECTIVE:  Margaret Webster is a 63 y.o. female who reports gradual onset of left lower dental pain described as aching/throbbing. Present for approx 2 weeks. Fever: absent. Tolerating PO intake but reports pain with chewing. Normal swallowing. She does not see a dentist regularly but has one. No neck swelling or pain. OTC analgesics without relief. Also with dry cough for a few days.  Social History   Tobacco Use  Smoking Status Every Day   Packs/day: 0.25   Years: 45.00   Total pack years: 11.25   Types: Cigarettes  Smokeless Tobacco Never  Tobacco Comments   4 cig a day, not interested in chantix   OBJECTIVE: Vitals:   11/08/22 1350  BP: (!) 162/91  Pulse: 60  Resp: 18  Temp: 98.3 F (36.8 C)  TempSrc: Oral  SpO2: 96%    General appearance: alert; no distress HENT: normocephalic; atraumatic; dentition: poor; left lower gum without areas of fluctuance, drainage, or bleeding  and with tenderness to palpation; normal jaw movement without difficulty Neck: supple without LAD; FROM; trachea midline Lungs: normal respirations; unlabored; speaks full sentences without difficulty; ctab; dry cough Skin: warm and dry Psychological: alert and cooperative; normal mood and affect  Allergies  Allergen Reactions   Sulfa Antibiotics Other (See Comments)    Hard to breathe   Crestor [Rosuvastatin] Other (See Comments)    Makes her mouth numb and causes side pain   Ibuprofen Other (See Comments)    Stomach upset   Polytrim [Polymyxin B-Trimethoprim] Itching and Swelling    redness   Influenza Vaccines Hives   Lipitor [Atorvastatin Calcium] Other (See Comments)    Muscle ache    Naproxen Nausea And Vomiting    Past Medical History:  Diagnosis Date   Arthritis    bil shoulders   Bronchitis    Chronic back pain    Chronic kidney disease    Diabetes mellitus    stopped Mrtformin 1 mo ago, made her "sick"   Fatty liver    Hypertension    Hypothyroidism    Stroke ([place])    either 2019/2020   Thyroid disease    Trigger thumb of right hand    Social History   Socioeconomic History   Marital status: Single    Spouse name: Not on file   Number of children: 1   Years of  education: Not on file   Highest education level: Not on file  Occupational History   Occupation: DISABLED    Employer: DISABLED  Tobacco Use   Smoking status: Every Day    Packs/day: 0.25    Years: 45.00    Total pack years: 11.25    Types: Cigarettes   Smokeless tobacco: Never   Tobacco comments:    4 cig a day, not interested in chantix  Vaping Use   Vaping Use: Never used  Substance and Sexual Activity   Alcohol use: Not Currently    Comment: occasional   Drug use: Not Currently    Types: Marijuana   Sexual activity: Yes    Partners: Male    Birth control/protection: None  Other Topics Concern   Not on file  Social History Narrative   Not on file   Social Determinants of  Health   Financial Resource Strain: High Risk (05/21/2022)   Overall Financial Resource Strain (CARDIA)    Difficulty of Paying Living Expenses: Hard  Food Insecurity: Food Insecurity Present (05/21/2022)   Hunger Vital Sign    Worried About Running Out of Food in the Last Year: Sometimes true    Ran Out of Food in the Last Year: Never true  Transportation Needs: No Transportation Needs (06/23/2022)   PRAPARE - Hydrologist (Medical): No    Lack of Transportation (Non-Medical): No  Physical Activity: Inactive (05/08/2022)   Exercise Vital Sign    Days of Exercise per Week: 0 days    Minutes of Exercise per Session: 0 min  Stress: No Stress Concern Present (05/08/2022)   [place]    Feeling of Stress : Not at all  Social Connections: Not on file  Intimate Partner Violence: Not on file   Family History  Problem Relation Age of Onset   Diabetes Mother    Cystic fibrosis Sister    Cancer Sister        ?   Breast cancer Maternal Aunt    Heart disease Maternal Grandmother    Clotting disorder Maternal Grandfather    Past Surgical History:  Procedure Laterality Date   BACK SURGERY  1993   CARPAL TUNNEL RELEASE  08/2015   CARPAL TUNNEL RELEASE Left 09/10/2017   Procedure: CARPAL TUNNEL RELEASE;  Surgeon: Leanora Cover, MD;  Location: [facility];  Service: Orthopedics;  Laterality: Left;   CERVICAL SPINE SURGERY Right 11/2012   ESOPHAGOGASTRODUODENOSCOPY (EGD) WITH PROPOFOL N/A 08/01/2022   Procedure: ESOPHAGOGASTRODUODENOSCOPY (EGD) WITH PROPOFOL;  Surgeon: Carol Ada, MD;  Location: [facility];  Service: Gastroenterology;  Laterality: N/A;   OOPHORECTOMY     TRIGGER FINGER RELEASE Right 08/06/2017   Procedure: RIGHT THUMB TRIGGER RELEASE;  Surgeon: Leanora Cover, MD;  Location: [facility];  Service: Orthopedics;  Laterality: Right;   ULNAR COLLATERAL  LIGAMENT REPAIR Right 03/21/2021   Procedure: RIGHT THUMB ULNAR COLLATERAL LIGAMENT REPAIR;  Surgeon: Leanora Cover, MD;  Location: [facility];  Service: Orthopedics;  Laterality: Right;   UPPER ESOPHAGEAL ENDOSCOPIC ULTRASOUND (EUS) N/A 08/01/2022   Procedure: UPPER ESOPHAGEAL ENDOSCOPIC ULTRASOUND (EUS);  Surgeon: Carol Ada, MD;  Location: [facility];  Service: Gastroenterology;  Laterality: N/AVanessa Kick, MD 11/08/22 1424

## 2022-11-11 DIAGNOSIS — N1831 Chronic kidney disease, stage 3a: Secondary | ICD-10-CM | POA: Diagnosis not present

## 2022-11-11 DIAGNOSIS — G8929 Other chronic pain: Secondary | ICD-10-CM | POA: Diagnosis not present

## 2022-11-11 DIAGNOSIS — M549 Dorsalgia, unspecified: Secondary | ICD-10-CM | POA: Diagnosis not present

## 2022-11-11 DIAGNOSIS — Z720 Tobacco use: Secondary | ICD-10-CM | POA: Diagnosis not present

## 2022-11-11 DIAGNOSIS — I129 Hypertensive chronic kidney disease with stage 1 through stage 4 chronic kidney disease, or unspecified chronic kidney disease: Secondary | ICD-10-CM | POA: Diagnosis not present

## 2022-11-11 DIAGNOSIS — E1122 Type 2 diabetes mellitus with diabetic chronic kidney disease: Secondary | ICD-10-CM | POA: Diagnosis not present

## 2022-11-16 ENCOUNTER — Other Ambulatory Visit: Payer: Self-pay | Admitting: Internal Medicine

## 2022-11-21 DIAGNOSIS — I129 Hypertensive chronic kidney disease with stage 1 through stage 4 chronic kidney disease, or unspecified chronic kidney disease: Secondary | ICD-10-CM | POA: Diagnosis not present

## 2022-11-22 ENCOUNTER — Emergency Department (HOSPITAL_COMMUNITY)
Admission: EM | Admit: 2022-11-22 | Discharge: 2022-11-22 | Disposition: A | Discharge status: Home / Self Care | Payer: 59 | Attending: Emergency Medicine | Admitting: Emergency Medicine

## 2022-11-22 ENCOUNTER — Other Ambulatory Visit: Payer: Self-pay

## 2022-11-22 ENCOUNTER — Encounter (HOSPITAL_COMMUNITY): Payer: Self-pay | Admitting: Emergency Medicine

## 2022-11-22 DIAGNOSIS — R195 Other fecal abnormalities: Secondary | ICD-10-CM

## 2022-11-22 DIAGNOSIS — Z7982 Long term (current) use of aspirin: Secondary | ICD-10-CM | POA: Insufficient documentation

## 2022-11-22 DIAGNOSIS — K921 Melena: Secondary | ICD-10-CM | POA: Diagnosis not present

## 2022-11-22 DIAGNOSIS — R109 Unspecified abdominal pain: Secondary | ICD-10-CM | POA: Diagnosis not present

## 2022-11-22 LAB — CBC WITH DIFFERENTIAL/PLATELET
Abs Immature Granulocytes: 0.02 10*3/uL (ref 0.00–0.07)
Basophils Absolute: 0.1 10*3/uL (ref 0.0–0.1)
Basophils Relative: 1 %
Eosinophils Absolute: 0.2 10*3/uL (ref 0.0–0.5)
Eosinophils Relative: 3 %
HCT: 41.4 % (ref 36.0–46.0)
Hemoglobin: 14.3 g/dL (ref 12.0–15.0)
Immature Granulocytes: 0 %
Lymphocytes Relative: 36 %
Lymphs Abs: 2.8 10*3/uL (ref 0.7–4.0)
MCH: 32.1 pg (ref 26.0–34.0)
MCHC: 34.5 g/dL (ref 30.0–36.0)
MCV: 92.8 fL (ref 80.0–100.0)
Monocytes Absolute: 0.8 10*3/uL (ref 0.1–1.0)
Monocytes Relative: 10 %
Neutro Abs: 4 10*3/uL (ref 1.7–7.7)
Neutrophils Relative %: 50 %
Platelets: 319 10*3/uL (ref 150–400)
RBC: 4.46 MIL/uL (ref 3.87–5.11)
RDW: 12.3 % (ref 11.5–15.5)
WBC: 7.9 10*3/uL (ref 4.0–10.5)
nRBC: 0 % (ref 0.0–0.2)

## 2022-11-22 LAB — COMPREHENSIVE METABOLIC PANEL
ALT: 16 U/L (ref 0–44)
AST: 22 U/L (ref 15–41)
Albumin: 3.9 g/dL (ref 3.5–5.0)
Alkaline Phosphatase: 54 U/L (ref 38–126)
Anion gap: 9 (ref 5–15)
BUN: 17 mg/dL (ref 8–23)
CO2: 27 mmol/L (ref 22–32)
Calcium: 9.9 mg/dL (ref 8.9–10.3)
Chloride: 99 mmol/L (ref 98–111)
Creatinine, Ser: 1.35 mg/dL — ABNORMAL HIGH (ref 0.44–1.00)
GFR, Estimated: 44 mL/min — ABNORMAL LOW (ref >60)
Glucose, Bld: 132 mg/dL — ABNORMAL HIGH (ref 70–99)
Potassium: 3.7 mmol/L (ref 3.5–5.1)
Sodium: 135 mmol/L (ref 135–145)
Total Bilirubin: 0.7 mg/dL (ref 0.3–1.2)
Total Protein: 7.2 g/dL (ref 6.5–8.1)

## 2022-11-22 LAB — TYPE AND SCREEN
ABO/RH(D): O POS
Antibody Screen: NEGATIVE

## 2022-11-22 LAB — POC OCCULT BLOOD, ED: Fecal Occult Bld: POSITIVE — AB

## 2022-11-22 NOTE — ED Provider Triage Note (Cosign Needed Addendum)
Emergency Medicine Provider Triage Evaluation Note  LISSETTE SCHENK , a 62 y.o. female  was evaluated in triage.  Pt complains of black stool. Pt also taking sea moss capsules Review of Systems  Positive: Abdominal pain  Negative: fever  Physical Exam  BP (!) 158/80 (BP Location: Right Arm)   Pulse 73   Temp 98.9 F (37.2 C)   Resp 16   Ht '5\' 7"'$  (1.702 m)   Wt 99.3 kg   SpO2 94%   BMI 34.30 kg/m  Gen:   Awake, no distress   Resp:  Normal effort  MSK:   Moves extremities without difficulty  Other:    Medical Decision Making  Medically screening exam initiated at 2:07 PM.  Appropriate orders placed.  TRINKA KESHISHYAN was informed that the remainder of the evaluation will be completed by another provider, this initial triage assessment does not replace that evaluation, and the importance of remaining in the ED until their evaluation is complete.     Fransico Meadow, PA-C 11/22/22 1408    Fransico Meadow, [place] 11/22/22 1435

## 2022-11-22 NOTE — ED Provider Notes (Signed)
[facility] Provider Note   CSN: [id number] Arrival date & time: 11/22/22  1352     History  Chief Complaint  Patient presents with   Melena    Margaret Webster is a 62 y.o. female.  HPI   Pt presents to the ED for evaluation of dark stools.  Patient states she has been having some abdominal discomfort this past week.  More so in the right side of her abdomen.  She has tried a few medications including Pepto-Bismol.  She was also taking sea moss capsules.  Patient thinks she started to notice a dark stools before she took the Pepto-Bismol but after this sea moss.  She also has been taking Goody powders.  She was googling and was concerned about intestinal bleeding.   Home Medications Prior to Admission medications   Medication Sig Start Date End Date Taking? Authorizing Provider  acetaminophen (TYLENOL) 500 MG tablet Take 1 tablet (500 mg total) by mouth every 6 (six) hours as needed. 11/08/22   Vanessa Kick, MD  amLODipine (NORVASC) 2.5 MG tablet TAKE 1 TABLET BY MOUTH ONCE  DAILY 09/26/22   [name], FNP  amoxicillin-clavulanate (AUGMENTIN) 875-125 MG tablet Take 1 tablet by mouth every 12 (twelve) hours. 11/08/22   Vanessa Kick, MD  aspirin EC 81 MG tablet Take 81 mg by mouth in the morning.    [provider]  cyclobenzaprine (FLEXERIL) 10 MG tablet Take 1 tablet (10 mg total) by mouth 3 (three) times daily as needed for muscle spasms. 05/29/22   [name], FNP  diclofenac Sodium (VOLTAREN) 1 % GEL Apply topically 4 (four) times daily as needed (pain.).    [provider]  DULoxetine (CYMBALTA) 30 MG capsule Take 60 mg by mouth daily at 2 PM. 07/06/22   [provider]  Evolocumab (REPATHA SURECLICK) 322 MG/ML SOAJ Inject 1 Dose into the skin every 14 (fourteen) days. 01/21/22   Hilty, Nadean Corwin, MD  ezetimibe (ZETIA) 10 MG tablet Take 1 tablet by mouth once daily 11/19/22   Rudean Haskell A, MD   losartan (COZAAR) 25 MG tablet Take 0.5 tablets (12.5 mg total) by mouth daily. Patient taking differently: Take 25 mg by mouth in the morning. 07/28/22   [name], FNP  metFORMIN (GLUCOPHAGE-XR) 500 MG 24 hr tablet TAKE 1 TABLET BY MOUTH ONCE  DAILY WITH BREAKFAST 09/26/22   [name], FNP  NEXLETOL 180 MG TABS TAKE 1 TABLET BY MOUTH AT  BEDTIME Patient taking differently: Take 1 tablet by mouth in the morning. 07/01/22   [name], FNP  promethazine-dextromethorphan (PROMETHAZINE-DM) 6.25-15 MG/5ML syrup Take 5 mLs by mouth 4 (four) times daily as needed for cough. 11/08/22   Vanessa Kick, MD  triamcinolone cream (KENALOG) 0.5 % Apply 1 application topically 2 (two) times daily. Patient taking differently: Apply 1 application  topically 2 (two) times daily as needed (skin irritation.). 11/14/21   Bary Castilla, NP  Vitamin D, Ergocalciferol, (DRISDOL) 1.25 MG (50000 UNIT) CAPS capsule Take 1 capsule (50,000 Units total) by mouth every Monday. 10/27/22   [name], FNP      Allergies    Sulfa antibiotics, Crestor [rosuvastatin], Ibuprofen, Polytrim [polymyxin b-trimethoprim], Influenza vaccines, Lipitor [atorvastatin calcium], and Naproxen    Review of Systems   Review of Systems  Physical Exam Updated Vital Signs BP (!) 140/58 (BP Location: Right Arm)   Pulse 63   Temp 98.8 F (37.1 C) (Oral)   Resp 18  Ht 1.702 m ('5\' 7"'$ )   Wt 99.3 kg   SpO2 97%   BMI 34.30 kg/m  Physical Exam Vitals and nursing note reviewed.  Constitutional:      General: She is not in acute distress.    Appearance: She is well-developed.  HENT:     Head: Normocephalic and atraumatic.     Right Ear: External ear normal.     Left Ear: External ear normal.  Eyes:     General: No scleral icterus.       Right eye: No discharge.        Left eye: No discharge.     Conjunctiva/sclera: Conjunctivae normal.  Neck:     Trachea: No tracheal deviation.  Cardiovascular:     Rate and Rhythm:  Normal rate and regular rhythm.  Pulmonary:     Effort: Pulmonary effort is normal. No respiratory distress.     Breath sounds: Normal breath sounds. No stridor. No wheezing or rales.  Abdominal:     General: Bowel sounds are normal. There is no distension.     Palpations: Abdomen is soft.     Tenderness: There is no abdominal tenderness. There is no guarding or rebound.  Musculoskeletal:        General: No tenderness or deformity.     Cervical back: Neck supple.  Skin:    General: Skin is warm and dry.     Findings: No rash.  Neurological:     General: No focal deficit present.     Mental Status: She is alert.     Cranial Nerves: No cranial nerve deficit, dysarthria or facial asymmetry.     Sensory: No sensory deficit.     Motor: No abnormal muscle tone or seizure activity.     Coordination: Coordination normal.  Psychiatric:        Mood and Affect: Mood normal.     ED Results / Procedures / Treatments   Labs (all labs ordered are listed, but only abnormal results are displayed) Labs Reviewed  COMPREHENSIVE METABOLIC PANEL - Abnormal; Notable for the following components:      Result Value   Glucose, Bld 132 (*)    Creatinine, Ser 1.35 (*)    GFR, Estimated 44 (*)    All other components within normal limits  POC OCCULT BLOOD, ED - Abnormal; Notable for the following components:   Fecal Occult Bld POSITIVE (*)    All other components within normal limits  CBC WITH DIFFERENTIAL/PLATELET  OCCULT BLOOD X 1 CARD TO LAB, STOOL  TYPE AND SCREEN    EKG None  Radiology No results found.  Procedures Procedures    Medications Ordered in ED Medications - No data to display  ED Course/ Medical Decision Making/ A&P Clinical Course as of 11/22/22 2253  Sat Nov 22, 2022  1551 Labs reviewed.  CBC normal.  Metabolic panel normal except creatinine slightly elevated 1.35 [JK]  1551 POC occult blood, ED Provider will collect(!) Occult stool positive [JK]  1621  Glasgow-Batchford score low.  0 points [JK]  1746 Discussed with Dr Estanislado Spire that outpatient follow up is reasonable.  Pt has seen Dr Benson Norway in the past.  [facility] had an endoscopy in october [JK]    Clinical Course User Index [JK] Dorie Rank, MD                             Medical Decision Making Amount and/or Complexity  of Data Reviewed Labs:  Decision-making details documented in ED Course.   Patient presented to the ED for evaluation of dark stools.  Patient has not been having any abdominal pain.  She has not had hematemesis.  Patient does admit to recent Pepto-Bismol use as well as sea moss.  She has used some NSAIDs.  Patient did not have any gross blood on rectal exam however it was trace guaiac positive.  Her laboratory tests are otherwise reassuring.  Patient overall low risk for severe GI bleeding.  I discussed the case with Dr. Silverio Decamp and she does agree it is reasonable for the patient to follow-up closely with Dr. Benson Norway as an outpatient.  Evaluation and diagnostic testing in the emergency department does not suggest an emergent condition requiring admission or immediate intervention beyond what has been performed at this time.  The patient is safe for discharge and has been instructed to return immediately for worsening symptoms, change in symptoms or any other concerns.        Final Clinical Impression(s) / ED Diagnoses Final diagnoses:  Guaiac positive stools    Rx / DC Orders ED Discharge Orders     None         Dorie Rank, MD 11/22/22 2253

## 2022-11-22 NOTE — ED Triage Notes (Signed)
Pt complains of black stools x 3 days. Pt started taking sea moss capsules to detox 5 days ago and then started taking pepto 2 days ago. Pt states abd pain x 2 days. Pt took a laxative at 10am today and states abd pain is worse in RLQ.

## 2022-11-22 NOTE — Discharge Instructions (Signed)
Stop taking the sea moss and the Pepto-Bismol.  That may help with the change in stool color.  Follow-up with Dr. Benson Norway for further evaluation of the dark-colored stools and trace blood noted.  Return to the ER for vomiting of blood abdominal pain fevers or other concerning symptoms.

## 2022-11-23 DIAGNOSIS — R5383 Other fatigue: Secondary | ICD-10-CM | POA: Diagnosis not present

## 2022-11-23 DIAGNOSIS — R112 Nausea with vomiting, unspecified: Secondary | ICD-10-CM | POA: Diagnosis not present

## 2022-11-23 DIAGNOSIS — N281 Cyst of kidney, acquired: Secondary | ICD-10-CM | POA: Diagnosis not present

## 2022-11-23 DIAGNOSIS — R1013 Epigastric pain: Secondary | ICD-10-CM | POA: Diagnosis not present

## 2022-11-23 DIAGNOSIS — M160 Bilateral primary osteoarthritis of hip: Secondary | ICD-10-CM | POA: Diagnosis not present

## 2022-11-23 DIAGNOSIS — I700 Atherosclerosis of aorta: Secondary | ICD-10-CM | POA: Diagnosis not present

## 2022-11-23 DIAGNOSIS — R109 Unspecified abdominal pain: Secondary | ICD-10-CM | POA: Diagnosis not present

## 2022-11-23 DIAGNOSIS — M47896 Other spondylosis, lumbar region: Secondary | ICD-10-CM | POA: Diagnosis not present

## 2022-11-23 DIAGNOSIS — K5790 Diverticulosis of intestine, part unspecified, without perforation or abscess without bleeding: Secondary | ICD-10-CM | POA: Diagnosis not present

## 2022-11-23 DIAGNOSIS — K551 Chronic vascular disorders of intestine: Secondary | ICD-10-CM | POA: Diagnosis not present

## 2022-11-23 DIAGNOSIS — K922 Gastrointestinal hemorrhage, unspecified: Secondary | ICD-10-CM | POA: Diagnosis not present

## 2022-11-23 DIAGNOSIS — J9811 Atelectasis: Secondary | ICD-10-CM | POA: Diagnosis not present

## 2022-11-24 ENCOUNTER — Telehealth: Payer: Self-pay

## 2022-11-24 NOTE — Telephone Encounter (Signed)
Transition Care Management Follow-up Telephone Call Date of discharge and from where: 11/22/2022 [facility]  How have you been since you were released from the hospital? Pt states she feels alright. She has found a new GI specialists.  Any questions or concerns? No  Items Reviewed: Did the pt receive and understand the discharge instructions provided? Yes  Medications obtained and verified? Yes  Other? Yes  Any new allergies since your discharge? No  Dietary orders reviewed? Yes Do you have support at home? Yes   Home Care and Equipment/Supplies: Were home health services ordered? no If so, what is the name of the agency? N/a  Has the agency set up a time to come to the patient's home? no Were any new equipment or medical supplies ordered?  No What is the name of the medical supply agency? N/a Were you able to get the supplies/equipment? no Do you have any questions related to the use of the equipment or supplies? No  Functional Questionnaire: (I = Independent and D = Dependent) ADLs: i  Bathing/Dressing- i  Meal Prep- i  Eating- i  Maintaining continence- i  Transferring/Ambulation- i  Managing Meds- i  Follow up appointments reviewed:  PCP Hospital f/u appt confirmed? Yes  Scheduled to see [name] on 11/25/2021  @ [facility]. [facility] f/u appt confirmed? No  Scheduled to see n/a on n/a @ n/a. Are transportation arrangements needed? No  If their condition worsens, is the pt aware to call PCP or go to the Emergency Dept.? Yes Was the patient provided with contact information for the PCP's office or ED? Yes Was to pt encouraged to call back with questions or concerns? Yes

## 2022-11-25 ENCOUNTER — Encounter: Payer: Commercial Managed Care - HMO | Admitting: Nurse Practitioner

## 2022-11-25 ENCOUNTER — Encounter: Payer: Commercial Managed Care - HMO | Admitting: Internal Medicine

## 2022-11-27 ENCOUNTER — Encounter: Payer: Self-pay | Admitting: Nurse Practitioner

## 2022-11-27 ENCOUNTER — Ambulatory Visit (INDEPENDENT_AMBULATORY_CARE_PROVIDER_SITE_OTHER): Payer: 59 | Admitting: Nurse Practitioner

## 2022-11-27 VITALS — BP 132/70 | HR 71 | Temp 98.1°F | Ht 65.2 in | Wt 220.6 lb

## 2022-11-27 DIAGNOSIS — Z23 Encounter for immunization: Secondary | ICD-10-CM

## 2022-11-27 DIAGNOSIS — K219 Gastro-esophageal reflux disease without esophagitis: Secondary | ICD-10-CM

## 2022-11-27 DIAGNOSIS — G720 Drug-induced myopathy: Secondary | ICD-10-CM | POA: Diagnosis not present

## 2022-11-27 DIAGNOSIS — I739 Peripheral vascular disease, unspecified: Secondary | ICD-10-CM | POA: Insufficient documentation

## 2022-11-27 DIAGNOSIS — Z0000 Encounter for general adult medical examination without abnormal findings: Secondary | ICD-10-CM

## 2022-11-27 DIAGNOSIS — E559 Vitamin D deficiency, unspecified: Secondary | ICD-10-CM

## 2022-11-27 DIAGNOSIS — I10 Essential (primary) hypertension: Secondary | ICD-10-CM

## 2022-11-27 DIAGNOSIS — E669 Obesity, unspecified: Secondary | ICD-10-CM

## 2022-11-27 DIAGNOSIS — R7303 Prediabetes: Secondary | ICD-10-CM

## 2022-11-27 DIAGNOSIS — I129 Hypertensive chronic kidney disease with stage 1 through stage 4 chronic kidney disease, or unspecified chronic kidney disease: Secondary | ICD-10-CM | POA: Diagnosis not present

## 2022-11-27 DIAGNOSIS — N183 Hypertensive chronic kidney disease with stage 1 through stage 4 chronic kidney disease, or unspecified chronic kidney disease: Secondary | ICD-10-CM

## 2022-11-27 DIAGNOSIS — T466X5A Adverse effect of antihyperlipidemic and antiarteriosclerotic drugs, initial encounter: Secondary | ICD-10-CM | POA: Diagnosis not present

## 2022-11-27 DIAGNOSIS — R195 Other fecal abnormalities: Secondary | ICD-10-CM

## 2022-11-27 DIAGNOSIS — Z789 Other specified health status: Secondary | ICD-10-CM

## 2022-11-27 DIAGNOSIS — Z0001 Encounter for general adult medical examination with abnormal findings: Secondary | ICD-10-CM

## 2022-11-27 DIAGNOSIS — N1831 Chronic kidney disease, stage 3a: Secondary | ICD-10-CM

## 2022-11-27 DIAGNOSIS — E782 Mixed hyperlipidemia: Secondary | ICD-10-CM

## 2022-11-27 LAB — POCT URINALYSIS DIPSTICK
Bilirubin, UA: NEGATIVE
Blood, UA: NEGATIVE
Glucose, UA: NEGATIVE
Ketones, UA: NEGATIVE
Leukocytes, UA: NEGATIVE
Nitrite, UA: NEGATIVE
Protein, UA: NEGATIVE
Spec Grav, UA: 1.01 (ref 1.010–1.025)
Urobilinogen, UA: 0.2 E.U./dL
pH, UA: 6.5 (ref 5.0–8.0)

## 2022-11-27 MED ORDER — SAXENDA 18 MG/3ML ~~LOC~~ SOPN: 3.0000 mg | PEN_INJECTOR | Freq: Every day | SUBCUTANEOUS | 1 refills | Status: DC

## 2022-11-27 NOTE — Patient Instructions (Addendum)
Health Maintenance, Female Adopting a healthy lifestyle and getting preventive care are important in promoting health and wellness. Ask your health care provider about: The right schedule for you to have regular tests and exams. Things you can do on your own to prevent diseases and keep yourself healthy. What should I know about diet, weight, and exercise? Eat a healthy diet  Eat a diet that includes plenty of vegetables, fruits, low-fat dairy products, and lean protein. Do not eat a lot of foods that are high in solid fats, added sugars, or sodium. Maintain a healthy weight Body mass index (BMI) is used to identify weight problems. It estimates body fat based on height and weight. Your health care provider can help determine your BMI and help you achieve or maintain a healthy weight. Get regular exercise Get regular exercise. This is one of the most important things you can do for your health. Most adults should: Exercise for at least 150 minutes each week. The exercise should increase your heart rate and make you sweat (moderate-intensity exercise). Do strengthening exercises at least twice a week. This is in addition to the moderate-intensity exercise. Spend less time sitting. Even light physical activity can be beneficial. Watch cholesterol and blood lipids Have your blood tested for lipids and cholesterol at 64 years of age, then have this test every 5 years. Have your cholesterol levels checked more often if: Your lipid or cholesterol levels are high. You are older than 64 years of age. You are at high risk for heart disease. What should I know about cancer screening? Depending on your health history and family history, you may need to have cancer screening at various ages. This may include screening for: Breast cancer. Cervical cancer. Colorectal cancer. Skin cancer. Lung cancer. What should I know about heart disease, diabetes, and high blood pressure? Blood pressure and heart  disease High blood pressure causes heart disease and increases the risk of stroke. This is more likely to develop in people who have high blood pressure readings or are overweight. Have your blood pressure checked: Every 3-5 years if you are 18-39 years of age. Every year if you are 40 years old or older. Diabetes Have regular diabetes screenings. This checks your fasting blood sugar level. Have the screening done: Once every three years after age 40 if you are at a normal weight and have a low risk for diabetes. More often and at a younger age if you are overweight or have a high risk for diabetes. What should I know about preventing infection? Hepatitis B If you have a higher risk for hepatitis B, you should be screened for this virus. Talk with your health care provider to find out if you are at risk for hepatitis B infection. Hepatitis C Testing is recommended for: Everyone born from 1945 through 1965. Anyone with known risk factors for hepatitis C. Sexually transmitted infections (STIs) Get screened for STIs, including gonorrhea and chlamydia, if: You are sexually active and are younger than 64 years of age. You are older than 64 years of age and your health care provider tells you that you are at risk for this type of infection. Your sexual activity has changed since you were last screened, and you are at increased risk for chlamydia or gonorrhea. Ask your health care provider if you are at risk. Ask your health care provider about whether you are at high risk for HIV. Your health care provider may recommend a prescription medicine to help prevent HIV   infection. If you choose to take medicine to prevent HIV, you should first get tested for HIV. You should then be tested every 3 months for as long as you are taking the medicine. Pregnancy If you are about to stop having your period (premenopausal) and you may become pregnant, seek counseling before you get pregnant. Take 400 to 800  micrograms (mcg) of folic acid every day if you become pregnant. Ask for birth control (contraception) if you want to prevent pregnancy. Osteoporosis and menopause Osteoporosis is a disease in which the bones lose minerals and strength with aging. This can result in bone fractures. If you are 60 years old or older, or if you are at risk for osteoporosis and fractures, ask your health care provider if you should: Be screened for bone loss. Take a calcium or vitamin D supplement to lower your risk of fractures. Be given hormone replacement therapy (HRT) to treat symptoms of menopause. Follow these instructions at home: Alcohol use Do not drink alcohol if: Your health care provider tells you not to drink. You are pregnant, may be pregnant, or are planning to become pregnant. If you drink alcohol: Limit how much you have to: 0-1 drink a day. Know how much alcohol is in your drink. In the [place] one drink equals one 12 oz bottle of beer (355 mL), one 5 oz glass of wine (148 mL), or one 1 oz glass of hard liquor (44 mL). Lifestyle Do not use any products that contain nicotine or tobacco. These products include cigarettes, chewing tobacco, and vaping devices, such as e-cigarettes. If you need help quitting, ask your health care provider. Do not use street drugs. Do not share needles. Ask your health care provider for help if you need support or information about quitting drugs. General instructions Schedule regular health, dental, and eye exams. Stay current with your vaccines. Tell your health care provider if: You often feel depressed. You have ever been abused or do not feel safe at home. Summary Adopting a healthy lifestyle and getting preventive care are important in promoting health and wellness. Follow your health care provider's instructions about healthy diet, exercising, and getting tested or screened for diseases. Follow your health care provider's instructions on monitoring your  cholesterol and blood pressure. This information is not intended to replace advice given to you by your health care provider. Make sure you discuss any questions you have with your health care provider. Document Revised: 03/04/2021 Document Reviewed: 03/04/2021 Elsevier Patient Education  [place].  Liraglutide Injection (Weight Management) - Kirke Shaggy What is this medication? LIRAGLUTIDE (LIR a GLOO tide) promotes weight loss. It may also be used to maintain weight loss. It works by decreasing appetite. Changes to diet and exercise are often combined with this medication. This medicine may be used for other purposes; ask your health care provider or pharmacist if you have questions. COMMON BRAND NAME(S): Saxenda What should I tell my care team before I take this medication? They need to know if you have any of these conditions: Endocrine tumors (MEN 2) or if someone in your family had these tumors Gallbladder disease High cholesterol History of pancreatitis Kidney disease or if you are on dialysis Liver disease Previous swelling of the tongue, face, or lips with difficulty breathing, difficulty swallowing, hoarseness, or tightening of the throat Stomach problems Substance use disorder Suicidal thoughts, plans, or attempt by you or a family member Thyroid cancer or if someone in your family had thyroid cancer An unusual or  allergic reaction to liraglutide, other medications, foods, dyes, or preservatives Pregnant or trying to get pregnant Breast-feeding How should I use this medication? This medication is for injection under the skin of your upper leg, stomach area, or upper arm. You will be taught how to prepare and give this medication. Use exactly as directed. Take your medication at regular intervals. Do not take it more often than directed. This medication comes with INSTRUCTIONS FOR USE. Ask your pharmacist for directions on how to use this medication. Read the information  carefully. Talk to your pharmacist or care team if you have questions. It is important that you put your used needles and syringes in a special sharps container. Do not put them in a trash can. If you do not have a sharps container, call your pharmacist or care team to get one. A special MedGuide will be given to you by the pharmacist with each prescription and refill. Be sure to read this information carefully each time. Talk to your care team about the use of this medication in children. While it may be prescribed for children as young as 92 years of age for selected conditions, precautions do apply. Overdosage: If you think you have taken too much of this medicine contact a poison control center or emergency room at once. NOTE: This medicine is only for you. Do not share this medicine with others. What if I miss a dose? If you miss a dose, take it as soon as you can. If it is almost time for your next dose, take only that dose. Do not take double or extra doses. If you miss your dose for 3 days or more, call your care team to talk about how to restart this medicine. What may interact with this medication? Insulin and other medications for diabetes This list may not describe all possible interactions. Give your health care provider a list of all the medicines, herbs, non-prescription drugs, or dietary supplements you use. Also tell them if you smoke, drink alcohol, or use illegal drugs. Some items may interact with your medicine. What should I watch for while using this medication? Visit your care team for regular checks on your progress. Drink plenty of fluids while taking this medication. Check with your care team if you get an attack of severe diarrhea, nausea, and vomiting. The loss of too much body fluid can make it dangerous for you to take this medication. This medication may affect blood sugar levels. Ask your care team if changes in diet or medications are needed if you have  diabetes. Patients and their families should watch out for worsening depression or thoughts of suicide. Also watch out for sudden changes in feelings such as feeling anxious, agitated, panicky, irritable, hostile, aggressive, impulsive, severely restless, overly excited and hyperactive, or not being able to sleep. If this happens, especially at the beginning of treatment or after a change in dose, call your care team. Talk to your care team if you wish to become pregnant or think you might be pregnant. Losing weight while pregnant is not advised and may cause harm to the unborn child. Talk to your care team for more information. What side effects may I notice from receiving this medication? Side effects that you should report to your care team as soon as possible: Allergic reactions or angioedema--skin rash, itching, hives, swelling of the face, eyes, lips, tongue, arms, or legs, trouble swallowing or breathing Fast or irregular heartbeat Gallbladder problems--severe stomach pain, nausea, vomiting, fever Kidney  injury--decrease in the amount of urine, swelling of the ankles, hands, or feet Pancreatitis--severe stomach pain that spreads to your back or gets worse after eating or when touched, fever, nausea, vomiting Thoughts of suicide or self-harm, worsening mood, feelings of depression Thyroid cancer--new mass or lump in the neck, pain or trouble swallowing, trouble breathing, hoarseness Side effects that usually do not require medical attention (report to your care team if they continue or are bothersome): Constipation Dizziness Fatigue Headache Loss of Appetite Nausea Upset stomach This list may not describe all possible side effects. Call your doctor for medical advice about side effects. You may report side effects to FDA at 1-800-FDA-1088. Where should I keep my medication? Keep out of the reach of children and pets. Store unopened pen in a refrigerator between 2 and 8 degrees C (36 and  46 degrees F). Do not freeze or use if the medication has been frozen. Protect from light and excessive heat. After you first use the pen, it can be stored at room temperature between 15 and 30 degrees C (59 and 86 degrees F) or in a refrigerator. Throw away your used pen after 30 days or after the expiration date, whichever comes first. Do not store your pen with the needle attached. If the needle is left on, medication may leak from the pen. NOTE: This sheet is a summary. It may not cover all possible information. If you have questions about this medicine, talk to your doctor, pharmacist, or health care provider.  2023 Elsevier/Gold Standard (2020-11-16 00:00:00)  You need to get an over the counter probiotic to take daily for your gut health.  If you are able to get the Saxenda call to office for a nurse visit.

## 2022-11-27 NOTE — Progress Notes (Signed)
Subjective:     Patient ID: Margaret Webster , female    DOB: 1961-03-25 , 62 y.o.   MRN: [id number]   Chief Complaint  Patient presents with   Annual Exam    HPI  She is here for her HM.  She went to the ER and urgent care two times one for blood in stools and abdomen pain. She is having problems eating. She is due to see Nephrology on 2/16 and cardiology later this month.   Wt Readings from Last 3 Encounters: 11/27/22 : 220 lb 9.6 oz (100.1 kg) 11/22/22 : 219 lb (99.3 kg) 10/17/22 : 226 lb 12.8 oz (102.9 kg)       Past Medical History:  Diagnosis Date   Arthritis    bil shoulders   Bronchitis    Chronic back pain    Chronic kidney disease    Diabetes mellitus    stopped Mrtformin 1 mo ago, made her "sick"   Fatty liver    Hypertension    Hypothyroidism    Stroke [facility])    either 2019/2020   Thyroid disease    Trigger thumb of right hand      Family History  Problem Relation Age of Onset   Diabetes Mother    Cystic fibrosis Sister    Cancer Sister        ?   Breast cancer Maternal Aunt    Heart disease Maternal Grandmother    Clotting disorder Maternal Grandfather      Current Outpatient Medications:    acetaminophen (TYLENOL) 500 MG tablet, Take 1 tablet (500 mg total) by mouth every 6 (six) hours as needed., Disp: 30 tablet, Rfl: 0   amLODipine (NORVASC) 2.5 MG tablet, TAKE 1 TABLET BY MOUTH ONCE  DAILY, Disp: 100 tablet, Rfl: 2   aspirin EC 81 MG tablet, Take 81 mg by mouth in the morning., Disp: , Rfl:    cyclobenzaprine (FLEXERIL) 10 MG tablet, Take 1 tablet (10 mg total) by mouth 3 (three) times daily as needed for muscle spasms., Disp: 30 tablet, Rfl: 0   diclofenac Sodium (VOLTAREN) 1 % GEL, Apply topically 4 (four) times daily as needed (pain.)., Disp: , Rfl:    DULoxetine (CYMBALTA) 30 MG capsule, Take 60 mg by mouth daily at 2 PM., Disp: , Rfl:    Evolocumab (REPATHA SURECLICK) [id number] MG/ML SOAJ, Inject 1 Dose into the skin every 14 (fourteen)  days., Disp: 2 mL, Rfl: 11   ezetimibe (ZETIA) 10 MG tablet, Take 1 tablet by mouth once daily, Disp: 30 tablet, Rfl: 0   Liraglutide -Weight Management (SAXENDA) 18 MG/3ML SOPN, Inject 3 mg into the skin daily., Disp: 15 mL, Rfl: 1   losartan (COZAAR) 25 MG tablet, Take 0.5 tablets (12.5 mg total) by mouth daily. (Patient taking differently: Take 25 mg by mouth in the morning.), Disp: 90 tablet, Rfl: 1   metFORMIN (GLUCOPHAGE-XR) 500 MG 24 hr tablet, TAKE 1 TABLET BY MOUTH ONCE  DAILY WITH BREAKFAST, Disp: 100 tablet, Rfl: 2   NEXLETOL 180 MG TABS, TAKE 1 TABLET BY MOUTH AT  BEDTIME (Patient taking differently: Take 1 tablet by mouth in the morning.), Disp: 100 tablet, Rfl: 2   promethazine-dextromethorphan (PROMETHAZINE-DM) 6.25-15 MG/5ML syrup, Take 5 mLs by mouth 4 (four) times daily as needed for cough., Disp: 118 mL, Rfl: 0   triamcinolone cream (KENALOG) 0.5 %, Apply 1 application topically 2 (two) times daily. (Patient taking differently: Apply 1 application  topically 2 (two)  times daily as needed (skin irritation.).), Disp: 30 g, Rfl: 0   Vitamin D, Ergocalciferol, (DRISDOL) 1.25 MG (50000 UNIT) CAPS capsule, Take 1 capsule (50,000 Units total) by mouth every Monday., Disp: 12 capsule, Rfl: 1   Allergies  Allergen Reactions   Sulfa Antibiotics Other (See Comments)    Hard to breathe   Amoxil [Amoxicillin]    Crestor [Rosuvastatin] Other (See Comments)    Makes her mouth numb and causes side pain   Ibuprofen Other (See Comments)    Stomach upset   Polytrim [Polymyxin B-Trimethoprim] Itching and Swelling    redness   Influenza Vaccines Hives   Lipitor [Atorvastatin Calcium] Other (See Comments)    Muscle ache    Naproxen Nausea And Vomiting      The patient states she is post menopausal status.  No LMP recorded. Patient is postmenopausal.. Negative for Dysmenorrhea and Negative for Menorrhagia. Negative for: breast discharge, breast lump(s), breast pain and breast self exam.  Associated symptoms include abnormal vaginal bleeding. Pertinent negatives include abnormal bleeding (hematology), anxiety, decreased libido, depression, difficulty falling sleep, dyspareunia, history of infertility, nocturia, sexual dysfunction, sleep disturbances, urinary incontinence, urinary urgency, vaginal discharge and vaginal itching. Diet regular - she had cut back on fried foods and rice. The patient states her exercise level is minimal - she is exercising Mon and Fri  The patient's tobacco use is:  Social History   Tobacco Use  Smoking Status Every Day   Packs/day: 0.25   Years: 45.00   Total pack years: 11.25   Types: Cigarettes  Smokeless Tobacco Never  Tobacco Comments   4 cig a day, not interested in chantix, 11/27/22 - she is smoking one cigarette a day   She has been exposed to passive smoke. The patient's alcohol use is:  Social History   Substance and Sexual Activity  Alcohol Use Not Currently   Comment: occasional  . Additional information: Last pap 12/03/2021, next one scheduled for 12/03/2024.    Review of Systems  Constitutional: Negative.   HENT: Negative.    Eyes: Negative.   Respiratory: Negative.    Cardiovascular: Negative.   Gastrointestinal: Negative.   Endocrine: Negative.   Genitourinary: Negative.   Musculoskeletal:  Negative for arthralgias.       Right hip pain - when she sleeps on that side feels better.  She is using pain cream. Feels like she is sitting on her bone. Sharp and burning pain  Skin:  Negative for rash.  Allergic/Immunologic: Negative.   Neurological: Negative.   Hematological: Negative.   Psychiatric/Behavioral: Negative.       Today's Vitals   11/27/22 1008  BP: 132/70  Pulse: 71  Temp: 98.1 F (36.7 C)  TempSrc: Oral  Weight: 220 lb 9.6 oz (100.1 kg)  Height: 5' 5.2" (1.656 m)   Body mass index is 36.48 kg/m.   Objective:  Physical Exam Vitals reviewed.  Constitutional:      General: She is not in acute  distress.    Appearance: Normal appearance. She is well-developed. She is obese.  HENT:     Head: Normocephalic and atraumatic.     Right Ear: Hearing, tympanic membrane, ear canal and external ear normal. There is no impacted cerumen.     Left Ear: Hearing, tympanic membrane, ear canal and external ear normal. There is no impacted cerumen.     Nose:     Comments: Deferred - masked    Mouth/Throat:     Comments: Deferred - masked  Eyes:     General: Lids are normal.     Extraocular Movements: Extraocular movements intact.     Conjunctiva/sclera: Conjunctivae normal.     Pupils: Pupils are equal, round, and reactive to light.     Funduscopic exam:    Right eye: No papilledema.        Left eye: No papilledema.  Neck:     Thyroid: No thyroid mass.     Vascular: No carotid bruit.  Cardiovascular:     Rate and Rhythm: Normal rate and regular rhythm.     Pulses: Normal pulses.     Heart sounds: Normal heart sounds. No murmur heard. Pulmonary:     Effort: Pulmonary effort is normal. No respiratory distress.     Breath sounds: Normal breath sounds. No wheezing.  Chest:     Chest wall: No mass.  Breasts:    Tanner Score is 5.     Right: Normal. No mass or tenderness.     Left: Normal. No mass or tenderness.  Abdominal:     General: Abdomen is flat. Bowel sounds are normal. There is no distension.     Palpations: Abdomen is soft.     Tenderness: There is no abdominal tenderness.  Genitourinary:    Rectum: Guaiac result negative.  Musculoskeletal:        General: No swelling or tenderness.     Right shoulder: Normal range of motion.     Left shoulder: Normal range of motion.     Cervical back: Full passive range of motion without pain, normal range of motion and neck supple.     Right lower leg: No edema.     Left lower leg: No edema.  Lymphadenopathy:     Upper Body:     Right upper body: No supraclavicular, axillary or pectoral adenopathy.     Left upper body: No  supraclavicular, axillary or pectoral adenopathy.  Skin:    General: Skin is warm and dry.     Capillary Refill: Capillary refill takes less than 2 seconds.  Neurological:     General: No focal deficit present.     Mental Status: She is alert and oriented to person, place, and time.     Cranial Nerves: No cranial nerve deficit.     Sensory: No sensory deficit.  Psychiatric:        Mood and Affect: Mood normal.        Behavior: Behavior normal.        Thought Content: Thought content normal.        Judgment: Judgment normal.         Assessment And Plan:     1. Encounter for general adult medical examination w/o abnormal findings Behavior modifications discussed and diet history reviewed.   Pt will continue to exercise regularly and modify diet with low GI, plant based foods and decrease intake of processed foods.  Recommend intake of daily multivitamin, Vitamin D, and calcium.  Recommend mammogram and colonoscopy for preventive screenings, as well as recommend immunizations that include influenza, TDAP, and Shingles  2. Benign hypertension with CKD (chronic kidney disease) stage III (HCC) Comments: Blood pressure is controlled.  Continue current medications.  Encouraged to avoid nephrotoxic medications.  She is being followed by nephrology. - POCT Urinalysis Dipstick (81002) - Microalbumin / Creatinine Urine Ratio - Amb Referral To Provider Referral Exercise Program (P.R.E.P)  3. Mixed hyperlipidemia Comments: She is currently on Nexletol unable to tolerate statins. - Lipid panel - Amb  Referral To Provider Referral Exercise Program (P.R.E.P)  4. Vitamin D deficiency  5. Prediabetes Comments: Hemoglobin A1c levels are stable.  Diet controlled. - Hemoglobin A1c - Amb Referral To Provider Referral Exercise Program (P.R.E.P)  6. Obesity (BMI 35.0-39.9 without comorbidity) Comments: Will try to get her approved for Saxenda.  Once picked up she will call the office to get  education.  She is advised to follow-up in 2 months. - Liraglutide -Weight Management (SAXENDA) 18 MG/3ML SOPN; Inject 3 mg into the skin daily.  Dispense: 15 mL; Refill: 1  7. Gastroesophageal reflux disease without esophagitis Comments: Encouraged to avoid foods that cause increased gas reduction.  8. Guaiac positive stools Comments: She is advised to contact Dr. Benson [place] office to get scheduled for further evaluation she is already established.  Had blood in her stools  9. PAD (peripheral artery disease) ([place])  10. Statin intolerance Comments: She has an intolerance to statins because of mouth numbness.  She is currently on next Nexletol  11. Statin myopathy [G72.0, [id number]   Patient was given opportunity to ask questions. Patient verbalized understanding of the plan and was able to repeat key elements of the plan. All questions were answered to their satisfaction.   [name], FNP    I, [name], FNP, have reviewed all documentation for this visit. The documentation on 11/27/22 for the exam, diagnosis, procedures, and orders are all accurate and complete.  THE PATIENT IS ENCOURAGED TO PRACTICE SOCIAL DISTANCING DUE TO THE COVID-19 PANDEMIC.

## 2022-11-28 ENCOUNTER — Telehealth: Payer: Self-pay

## 2022-11-28 NOTE — Telephone Encounter (Signed)
Called to discuss PREP program referral, left voicemail  

## 2022-12-01 ENCOUNTER — Ambulatory Visit: Payer: Self-pay

## 2022-12-01 NOTE — Patient Outreach (Signed)
  Care Coordination   12/01/2022 Name: Margaret Webster MRN: [id number] DOB: 13-Jan-1961   Care Coordination Outreach Attempts:  An unsuccessful telephone outreach was attempted for a scheduled appointment today.  Follow Up Plan:  Additional outreach attempts will be made to offer the patient care coordination information and services.   Encounter Outcome:  Pt. Request to Call Back   Care Coordination Interventions:  No, not indicated    Barb Merino, RN, BSN, CCM Care Management Coordinator [facility]  Direct Phone: [phone]

## 2022-12-10 ENCOUNTER — Other Ambulatory Visit: Payer: Self-pay

## 2022-12-10 ENCOUNTER — Other Ambulatory Visit: Payer: 59

## 2022-12-10 DIAGNOSIS — R21 Rash and other nonspecific skin eruption: Secondary | ICD-10-CM

## 2022-12-10 DIAGNOSIS — I129 Hypertensive chronic kidney disease with stage 1 through stage 4 chronic kidney disease, or unspecified chronic kidney disease: Secondary | ICD-10-CM | POA: Diagnosis not present

## 2022-12-10 DIAGNOSIS — E782 Mixed hyperlipidemia: Secondary | ICD-10-CM | POA: Diagnosis not present

## 2022-12-10 DIAGNOSIS — N183 Chronic kidney disease, stage 3 unspecified: Secondary | ICD-10-CM | POA: Diagnosis not present

## 2022-12-10 DIAGNOSIS — R7303 Prediabetes: Secondary | ICD-10-CM | POA: Diagnosis not present

## 2022-12-10 MED ORDER — TRIAMCINOLONE ACETONIDE 0.5 % EX CREA: 1.0000 | TOPICAL_CREAM | Freq: Two times a day (BID) | CUTANEOUS | 0 refills | Status: DC

## 2022-12-11 LAB — LIPID PANEL
Chol/HDL Ratio: 2.7 ratio (ref 0.0–4.4)
Cholesterol, Total: 128 mg/dL (ref 100–199)
HDL: 47 mg/dL (ref >39)
LDL Chol Calc (NIH): 55 mg/dL (ref 0–99)
Triglycerides: 152 mg/dL — ABNORMAL HIGH (ref 0–149)
VLDL Cholesterol Cal: 26 mg/dL (ref 5–40)

## 2022-12-11 LAB — MICROALBUMIN / CREATININE URINE RATIO
Creatinine, Urine: 157.9 mg/dL
Microalb/Creat Ratio: 124 mg/g creat — ABNORMAL HIGH (ref 0–29)
Microalbumin, Urine: 195.1 ug/mL

## 2022-12-11 LAB — HEMOGLOBIN A1C
Est. average glucose Bld gHb Est-mCnc: 131 mg/dL
Hgb A1c MFr Bld: 6.2 % — ABNORMAL HIGH (ref 4.8–5.6)

## 2022-12-12 ENCOUNTER — Telehealth: Payer: Self-pay

## 2022-12-12 DIAGNOSIS — I129 Hypertensive chronic kidney disease with stage 1 through stage 4 chronic kidney disease, or unspecified chronic kidney disease: Secondary | ICD-10-CM | POA: Diagnosis not present

## 2022-12-12 DIAGNOSIS — N1831 Chronic kidney disease, stage 3a: Secondary | ICD-10-CM | POA: Diagnosis not present

## 2022-12-12 NOTE — Telephone Encounter (Signed)
She returned my call, wanting to attend PREP closer to her home; left voicemail letting her know only class availability is at [place] in April.

## 2022-12-15 ENCOUNTER — Other Ambulatory Visit: Payer: Self-pay | Admitting: Internal Medicine

## 2022-12-19 ENCOUNTER — Ambulatory Visit: Payer: 59

## 2022-12-19 VITALS — BP 134/80 | HR 84 | Temp 98.5°F | Ht 65.0 in | Wt 220.0 lb

## 2022-12-19 DIAGNOSIS — I129 Hypertensive chronic kidney disease with stage 1 through stage 4 chronic kidney disease, or unspecified chronic kidney disease: Secondary | ICD-10-CM

## 2022-12-19 NOTE — Progress Notes (Signed)
Patient presents today a bp check, patient currently taking amlodipine 2.'5mg'$  and losartan '25mg'$ . Patient hasn't taking amlodipine today.  BP Readings from Last 3 Encounters:  12/19/22 134/80  11/27/22 132/70  11/22/22 (!) 140/58  Per provider- take amlodipine in PM. Provider wants to follow up in 79m

## 2023-01-02 ENCOUNTER — Ambulatory Visit: Payer: Self-pay

## 2023-01-02 NOTE — Patient Instructions (Signed)
Visit Information  Thank you for taking time to visit with me today. Please don't hesitate to contact me if I can be of assistance to you.   Following are the goals we discussed today:   Goals Addressed             This Visit's Progress    COMPLETED: I need help with transportation       Care Coordination Interventions: Determined patient voices having no other concerns related to transportation at this time        To improve diabetes by lowering A1c       Care Coordination Interventions: Review of patient status, including review of consultants reports, relevant laboratory and other test results, and medications completed Counseled on Diabetic diet, my plate method, [id number] minutes of moderate intensity exercise/week Discussed with patient PCP referral for PREP, determined patient would like to sign up with the [facility] near her home Emailed [place] nurse requesting patient outreach to schedule at [place] scheduled/upcoming provider appointment including: next PCP follow up appointment with [name] FNP scheduled on 03/02/23 '@12'$ :00 PM  Mailed printed educational materials related to Diabetes management Lab Results  Component Value Date   HGBA1C 6.2 (H) 12/10/2022             Our next appointment is by telephone on 03/04/23 at 12 PM  Please call the care guide team at [phone] if you need to cancel or reschedule your appointment.   If you are experiencing a Mental Health or [place] or need someone to talk to, please call 1-800-273-TALK (toll free, 24 hour hotline) go to [facility] [address] [place] [phone])  Patient verbalizes understanding of instructions and care plan provided today and agrees to view in [place]. Active MyChart status and patient understanding of how to access instructions and care plan via MyChart confirmed with patient.     Barb Merino, RN, BSN,  CCM Care Management Coordinator [facility] Care Management  Direct Phone: [phone]

## 2023-01-02 NOTE — Patient Outreach (Signed)
  Care Coordination   Follow Up Visit Note   01/02/2023 Name: Margaret Webster MRN: [id number] DOB: 1961/09/29  Margaret Webster is a 62 y.o. year old female who sees [name], [place] for primary care. I spoke with  Margaret Webster by phone today.  What matters to the patients health and wellness today?  Patient will continue to work on eating a low carb diet to improve her A1c. She will start the PREP program at next availability.     Goals Addressed             This Visit's Progress    COMPLETED: I need help with transportation       Care Coordination Interventions: Determined patient voices having no other concerns related to transportation at this time        To improve diabetes by lowering A1c       Care Coordination Interventions: Review of patient status, including review of consultants reports, relevant laboratory and other test results, and medications completed Counseled on Diabetic diet, my plate method, 825 minutes of moderate intensity exercise/week Discussed with patient PCP referral for PREP, determined patient would like to sign up with the [facility] near her home Emailed [place] nurse requesting patient outreach to schedule at [place] scheduled/upcoming provider appointment including: next PCP follow up appointment with [name] FNP scheduled on 03/02/23 @12 :00 PM  Mailed printed educational materials related to Diabetes management Lab Results  Component Value Date   HGBA1C 6.2 (H) 12/10/2022         Interventions Today    Flowsheet Row Most Recent Value  Chronic Disease   Chronic disease during today's visit Diabetes  General Interventions   General Interventions Discussed/Reviewed General Interventions Discussed, General Interventions Reviewed, Labs, Lipid Profile, Doctor Visits  Labs Hgb A1c every 3 months  Doctor Visits Discussed/Reviewed Doctor Visits Discussed, Doctor Visits Reviewed, PCP  Exercise Interventions    Exercise Discussed/Reviewed Physical Activity  Physical Activity Discussed/Reviewed PREP, Physical Activity Reviewed, Physical Activity Discussed  Education Interventions   Education Provided Provided Printed Education, Provided Education  Provided Verbal Education On Nutrition, Exercise, Labs  Labs Reviewed Hgb A1c, Lipid Profile  Nutrition Interventions   Nutrition Discussed/Reviewed Nutrition Discussed, Portion sizes, Carbohydrate meal planning, Decreasing salt          SDOH assessments and interventions completed:  No     Care Coordination Interventions:  Yes, provided   Follow up plan: Follow up call scheduled for 03/04/23 @12  PM    Encounter Outcome:  Pt. Visit Completed

## 2023-01-03 ENCOUNTER — Telehealth: Payer: Self-pay

## 2023-01-03 NOTE — Telephone Encounter (Signed)
Call to pt reference PREP.  Would prefer to come to a location closer to home. Can do [facility].  Can do a 1030a MW class. Next one starts on 02/09/23. Will call her closer to start of class to schedule intake.  Has number for call back

## 2023-01-19 IMAGING — XA DG FLUORO GUIDE NDL PLC/BX
1 series · 1 of 1 positions shown · non-contrast
Comparison: none

CLINICAL DATA: Chronic right thumb pain around the first MCP joint.

[Series 1: ortho standard · 1 of 1 slices shown]
[im 1/1]
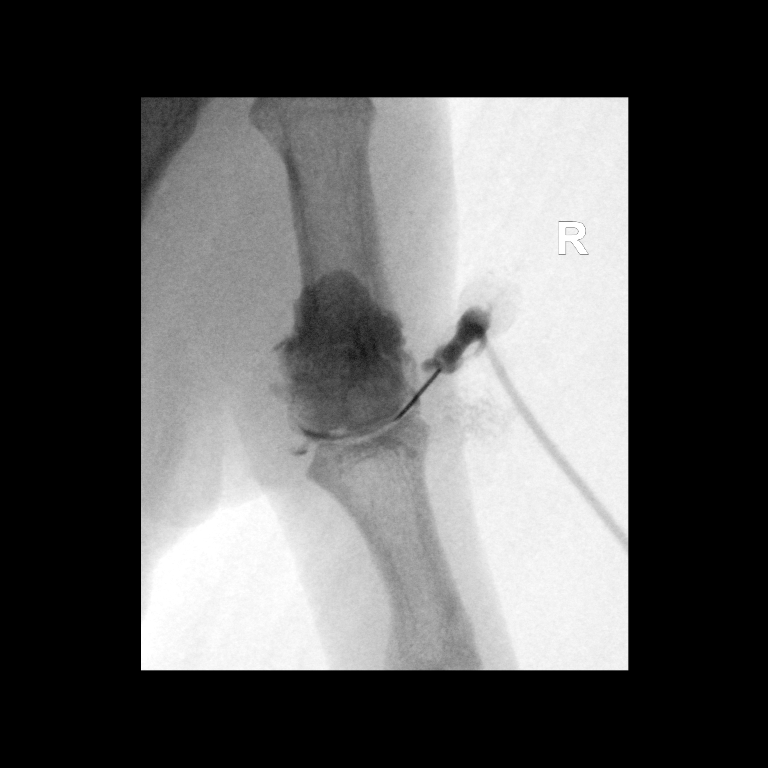

[1 of 1 positions shown; findings below may reference images not displayed]

FLUOROSCOPY TIME:  Radiation Exposure Index (as provided by the
fluoroscopic device): 0.1 mGy

Fluoroscopy Time:  12 seconds

Number of Acquired Images:  0

PROCEDURE:
The risks and benefits of the procedure were discussed with the
patient, and written informed consent was obtained. The patient
stated no history of allergy to contrast media. A formal timeout
procedure was performed with the patient according to departmental
protocol.

The patient was placed supine on the fluoroscopy table and the right
thumb MCP joint was identified under fluoroscopy. The skin overlying
the right thumb MCP joint was subsequently cleaned with Betadine and
a sterile drape was placed over the area of interest. 2 ml 1%
Lidocaine was used to anesthetize the skin around the needle
insertion site.

A 25 gauge needle was inserted into the right thumb MCP joint under
fluoroscopy.

1 ml of gadolinium mixture (0.1 ml of Multihance mixed with 15 ml of
Isovue-M 200 contrast and 5 ml of sterile saline) were injected into
the right thumb MCP joint.

The needle was removed and hemostasis was achieved. The patient was
subsequently transferred to MRI for imaging.
IMPRESSION: Technically successful right thumb MCP joint injection for MRI.

## 2023-01-30 ENCOUNTER — Telehealth: Payer: Self-pay

## 2023-01-30 NOTE — Telephone Encounter (Signed)
Call to pt reference starting PREP on 02/09/23 at [facility]. Can start then.  Intake scheduled for 02/04/23 at 1030a.  Will meet pt at [facility] in lobby

## 2023-02-04 ENCOUNTER — Telehealth: Payer: Self-pay

## 2023-02-04 NOTE — Telephone Encounter (Signed)
Call to pt this am. Did not show for intake for 4/15 class.  Pt sts she is not feeling well. Explained would need to have this done before she starts class on the 15th. Confirmed she wants to do program.  Rescheduled intake for 02/05/23 at 1130a.  Asked that she call me if she cannot make it

## 2023-02-05 NOTE — Progress Notes (Signed)
[facility] PREP Evaluation  Patient Details  Name: Margaret Webster MRN: [id number] Date of Birth: [birth year] Age: 62 y.o. PCP: [name], FNP  Vitals:   02/05/23 1130  BP: (!) 158/70  SpO2: 99%  Weight: 215 lb 12.8 oz (97.9 kg)     [facility] Eval - 02/05/23 1200       [facility] "PREP" Location   [facility] "PREP" Location [facility]      Referral    Referring Provider [name]    Reason for referral Inactivity;Diabetes;Hypertension;Current Smoker;Stroke    Program Start Date 02/09/23   MW [id number] x 12 wks     Measurement   Waist Circumference 47 inches    Hip Circumference 46 inches    Body fat 45.1 percent      Information for Trainer   Goals get weight under 200 lbs, get more energy, be able to walk 30 min, loose inch off waist    Current Exercise None    Orthopedic Concerns Back, Hips, Knees    Pertinent Medical History Stroke, HTN, DM, smoker    Current Barriers none    Restrictions/Precautions --   tripped on step recently   Medications that affect exercise Medication causing dizziness/drowsiness      Timed Up and Go (TUGS)   Timed Up and Go Low risk <9 seconds      Mobility and Daily Activities   I have no trouble taking out the trash. 1    I do housework such as vacuuming and dusting on my own without difficulty. 4    I can easily lift a gallon of milk (8lbs). 2    I can easily walk a mile. 2    I have no trouble reaching into high cupboards or reaching down to pick up something from the floor. 2    I do not have trouble doing out-door work such as Loss adjuster, chartered, raking leaves, or gardening. 3      Mobility and Daily Activities   I feel younger than my age. 2    I feel independent. 4    I feel energetic. 2    I live an active life.  2    I feel strong. 4    I feel healthy. 2    I feel active as other people my age. 1      How fit and strong are you.   Fit and Strong Total Score 31            Past Medical History:  Diagnosis Date   Arthritis    bil  shoulders   Bronchitis    Chronic back pain    Chronic kidney disease    Diabetes mellitus    stopped Mrtformin 1 mo ago, made her "sick"   Fatty liver    Hypertension    Hypothyroidism    Stroke [facility])    either 2019/2020   Thyroid disease    Trigger thumb of right hand    Past Surgical History:  Procedure Laterality Date   BACK SURGERY  1993   CARPAL TUNNEL RELEASE  08/2015   CARPAL TUNNEL RELEASE Left 09/10/2017   Procedure: CARPAL TUNNEL RELEASE;  Surgeon: Betha Loa, MD;  Location: [facility];  Service: Orthopedics;  Laterality: Left;   CERVICAL SPINE SURGERY Right 11/2012   ESOPHAGOGASTRODUODENOSCOPY (EGD) WITH PROPOFOL N/A 08/01/2022   Procedure: ESOPHAGOGASTRODUODENOSCOPY (EGD) WITH PROPOFOL;  Surgeon: Jeani Hawking, MD;  Location: [facility];  Service: Gastroenterology;  Laterality: N/A;  OOPHORECTOMY     TRIGGER FINGER RELEASE Right 08/06/2017   Procedure: RIGHT THUMB TRIGGER RELEASE;  Surgeon: Betha Loa, MD;  Location: [facility];  Service: Orthopedics;  Laterality: Right;   ULNAR COLLATERAL LIGAMENT REPAIR Right 03/21/2021   Procedure: RIGHT THUMB ULNAR COLLATERAL LIGAMENT REPAIR;  Surgeon: Betha Loa, MD;  Location: [facility];  Service: Orthopedics;  Laterality: Right;   UPPER ESOPHAGEAL ENDOSCOPIC ULTRASOUND (EUS) N/A 08/01/2022   Procedure: UPPER ESOPHAGEAL ENDOSCOPIC ULTRASOUND (EUS);  Surgeon: Jeani Hawking, MD;  Location: [facility];  Service: Gastroenterology;  Laterality: N/A;   Social History   Tobacco Use  Smoking Status Every Day   Packs/day: 0.25   Years: 45.00   Additional pack years: 0.00   Total pack years: 11.25   Types: Cigarettes  Smokeless Tobacco Never  Tobacco Comments   4 cig a day, not interested in chantix, 11/27/22 - she is smoking one cigarette a day    Bonnye Fava 02/05/2023, 12:27 PM

## 2023-02-12 NOTE — Progress Notes (Signed)
[facility] PREP Weekly Session  Patient Details  Name: Margaret Webster MRN: [id number] Date of Birth: [birth year] Age: 62 y.o. PCP: [name], FNP  There were no vitals filed for this visit.   [facility] Weekly seesion - 02/12/23 1500       [facility] "PREP" Location   [facility] "PREP" Location [facility]      Weekly Session   Topic Discussed Goal setting and welcome to the program   fit testing   Classes attended to date 2             Bonnye Fava 02/12/2023, 3:54 PM

## 2023-02-17 NOTE — Progress Notes (Signed)
[facility] PREP Weekly Session  Patient Details  Name: Margaret Webster MRN: [id number] Date of Birth: [birth year] Age: 62 y.o. PCP: [name], FNP  Vitals:   02/17/23 1030  Weight: 213 lb (96.6 kg)     [facility] Weekly seesion - 02/17/23 1700       [facility] "PREP" Location   [facility] "PREP" Location [facility]      Weekly Session   Topic Discussed Importance of resistance training;Other ways to be active    Minutes exercised this week 40 minutes    Classes attended to date 3             Bonnye Fava 02/17/2023, 5:21 PM

## 2023-02-18 DIAGNOSIS — H10023 Other mucopurulent conjunctivitis, bilateral: Secondary | ICD-10-CM | POA: Diagnosis not present

## 2023-02-18 DIAGNOSIS — H16003 Unspecified corneal ulcer, bilateral: Secondary | ICD-10-CM | POA: Diagnosis not present

## 2023-02-18 DIAGNOSIS — N1831 Chronic kidney disease, stage 3a: Secondary | ICD-10-CM | POA: Diagnosis not present

## 2023-02-24 DIAGNOSIS — H10403 Unspecified chronic conjunctivitis, bilateral: Secondary | ICD-10-CM | POA: Diagnosis not present

## 2023-02-24 DIAGNOSIS — H16003 Unspecified corneal ulcer, bilateral: Secondary | ICD-10-CM | POA: Diagnosis not present

## 2023-02-25 DIAGNOSIS — G8929 Other chronic pain: Secondary | ICD-10-CM | POA: Diagnosis not present

## 2023-02-25 DIAGNOSIS — E1122 Type 2 diabetes mellitus with diabetic chronic kidney disease: Secondary | ICD-10-CM | POA: Diagnosis not present

## 2023-02-25 DIAGNOSIS — E875 Hyperkalemia: Secondary | ICD-10-CM | POA: Diagnosis not present

## 2023-02-25 DIAGNOSIS — I129 Hypertensive chronic kidney disease with stage 1 through stage 4 chronic kidney disease, or unspecified chronic kidney disease: Secondary | ICD-10-CM | POA: Diagnosis not present

## 2023-02-25 DIAGNOSIS — N1831 Chronic kidney disease, stage 3a: Secondary | ICD-10-CM | POA: Diagnosis not present

## 2023-02-25 DIAGNOSIS — M549 Dorsalgia, unspecified: Secondary | ICD-10-CM | POA: Diagnosis not present

## 2023-02-25 DIAGNOSIS — Z720 Tobacco use: Secondary | ICD-10-CM | POA: Diagnosis not present

## 2023-02-25 NOTE — Progress Notes (Signed)
[facility] PREP Weekly Session  Patient Details  Name: Margaret Webster MRN: [id number] Date of Birth: Jan 22, 1961 Age: 62 y.o. PCP: [name], FNP  Vitals:   02/23/23 1030  Weight: 217 lb (98.4 kg)     [facility] Weekly seesion - 02/25/23 0900       [facility] "PREP" Location   [facility]" [facility]      Weekly Session   Topic Discussed Healthy eating tips   keep added sugars below 24 g   Minutes exercised this week 1250 minutes    Classes attended to date 4             Pam Jerral Bonito 02/25/2023, 9:26 AM

## 2023-03-02 ENCOUNTER — Ambulatory Visit: Payer: 59 | Admitting: Nurse Practitioner

## 2023-03-04 ENCOUNTER — Ambulatory Visit: Payer: Self-pay

## 2023-03-04 NOTE — Patient Outreach (Signed)
  Care Coordination   03/04/2023 Name: Margaret Webster MRN: [id number] DOB: 13-Oct-1961   Care Coordination Outreach Attempts:  An unsuccessful telephone outreach was attempted for a scheduled appointment today.  Follow Up Plan:  Additional outreach attempts will be made to offer the patient care coordination information and services.   Encounter Outcome:  No Answer   Care Coordination Interventions:  No, not indicated    Delsa Sale, RN, BSN, CCM Care Management Coordinator [facility] Care Management  Direct Phone: [phone]

## 2023-03-04 NOTE — Progress Notes (Signed)
[facility] PREP Weekly Session  Patient Details  Name: Margaret Webster MRN: [id number] Date of Birth: [birth year] Age: 62 y.o. PCP: [name], FNP  Vitals:   03/02/23 1030  Weight: 220 lb (99.8 kg)     [facility] Weekly seesion - 03/04/23 1500       [facility] "PREP" Location   [facility] "PREP" Location [facility]      Weekly Session   Topic Discussed Health habits    Minutes exercised this week 165 minutes    Classes attended to date 6            Class held on 03/02/23 Bonnye Fava 03/04/2023, 3:45 PM

## 2023-03-10 ENCOUNTER — Telehealth: Payer: Self-pay | Admitting: *Deleted

## 2023-03-10 DIAGNOSIS — H10403 Unspecified chronic conjunctivitis, bilateral: Secondary | ICD-10-CM | POA: Diagnosis not present

## 2023-03-10 NOTE — Progress Notes (Signed)
[facility] PREP Weekly Session  Patient Details  Name: VIE DECELLES MRN: [id number] Date of Birth: 01-Aug-1961 Age: 62 y.o. PCP: [name], FNP  Vitals:     [facility] Weekly seesion - 03/10/23 1200       [facility] "PREP" Location   [facility]      Weekly Session   Topic Discussed Restaurant Eating    Minutes exercised this week 135 minutes    Classes attended to date 7             Bonnye Fava 03/10/2023, 12:04 PM

## 2023-03-10 NOTE — Progress Notes (Unsigned)
  Care Coordination Note  03/10/2023 Name: Margaret Webster MRN: [id number] DOB: 1961-04-29  Margaret Webster is a 62 y.o. year old female who is a primary care patient of [name], FNP and is actively engaged with the care management team. I reached out to [name] by phone today to assist with re-scheduling a follow up visit with the RN Case Manager  Follow up plan: Unsuccessful telephone outreach attempt made. A HIPAA compliant phone message was left for the patient providing contact information and requesting a return call.   [facility]  Care Coordination Care Guide  Direct Dial: [phone]

## 2023-03-11 DIAGNOSIS — N1831 Chronic kidney disease, stage 3a: Secondary | ICD-10-CM | POA: Diagnosis not present

## 2023-03-12 ENCOUNTER — Ambulatory Visit: Payer: 59 | Admitting: Nurse Practitioner

## 2023-03-12 NOTE — Progress Notes (Signed)
  Care Coordination Note  03/12/2023 Name: Margaret Webster MRN: [id number] DOB: Oct 27, 1961  Margaret Webster is a 62 y.o. year old female who is a primary care patient of [name], FNP and is actively engaged with the care management team. I reached out to [name] by phone today to assist with re-scheduling a follow up visit with the RN Case Manager  Follow up plan: Unsuccessful telephone outreach attempt made. A HIPAA compliant phone message was left for the patient providing contact information and requesting a return call.  We have been unable to make contact with the patient for follow up. The care management team is available to follow up with the patient after provider conversation with the patient regarding recommendation for care management engagement and subsequent re-referral to the care management team.   [facility] Coordination Care Guide  Direct Dial: [phone]

## 2023-03-12 NOTE — Progress Notes (Signed)
Unable to make contact to reschedule.  [facility]  Care Coordination Care Guide  Direct Dial: [phone]

## 2023-03-17 DIAGNOSIS — R051 Acute cough: Secondary | ICD-10-CM | POA: Diagnosis not present

## 2023-03-27 ENCOUNTER — Other Ambulatory Visit: Payer: Self-pay

## 2023-03-30 ENCOUNTER — Ambulatory Visit: Payer: Self-pay | Admitting: Nurse Practitioner

## 2023-03-31 NOTE — Progress Notes (Signed)
[facility] PREP Weekly Session  Patient Details  Name: Margaret Webster MRN: [id number] Date of Birth: August 21, 1961 Age: 62 y.o. PCP: [name], FNP  Vitals:   03/30/23 1031  Weight: 218 lb (98.9 kg)     [facility] Weekly seesion - 03/31/23 1000       [facility] "PREP" Location   [facility] "PREP" Location [facility]      Weekly Session   Topic Discussed Expectations and non-scale victories    Minutes exercised this week 119 minutes    Classes attended to date 50             Pam Jerral Bonito 03/31/2023, 10:32 AM

## 2023-04-06 NOTE — Progress Notes (Signed)
[facility] PREP Weekly Session  Patient Details  Name: Margaret Webster MRN: [id number] Date of Birth: [birth year] Age: 63 y.o. PCP: [name], FNP  Vitals:   04/06/23 1030  Weight: 218 lb (98.9 kg)     [facility] Weekly seesion - 04/06/23 1200       [facility] "PREP" Location   [facility] "PREP" Location [facility]      Weekly Session   Topic Discussed --   portions   Minutes exercised this week 120 minutes    Classes attended to date 11             Bonnye Fava 04/06/2023, 12:36 PM

## 2023-04-08 ENCOUNTER — Encounter: Payer: Self-pay | Admitting: Pharmacist

## 2023-04-08 DIAGNOSIS — G720 Drug-induced myopathy: Secondary | ICD-10-CM

## 2023-04-08 NOTE — Progress Notes (Signed)
[facility]) [facility] Quality Pharmacy Team Statin Quality Measure Assessment  04/08/2023  Margaret Webster 11/25/1960 [id number]  Per review of chart and payor information, patient has a diagnosis of diabetes but is not currently filling a statin prescription.  This places patient into the Statin Use In Patients with Diabetes (SUPD) measure for CMS.    Patient has documented trials of statins with reported myalgia, but no corresponding CPT codes that would exclude patient from SUPD measure. She is on Repatha and ezetimibe.  She has an upcoming appointment 04/09/23.  If deemed therapeutically appropriate, a statin exclusion code could be associated with the upcoming visit. Associating a statin exclusion code would remove the patient from the measure.  The ASCVD Risk score (Arnett DK, et al., 2019) failed to calculate for the following reasons:   The patient has a prior MI or stroke diagnosis 12/10/2022     Component Value Date/Time   CHOL 128 12/10/2022 0856   TRIG 152 (H) 12/10/2022 0856   HDL 47 12/10/2022 0856   CHOLHDL 2.7 12/10/2022 0856   CHOLHDL 7.1 02/27/2020 0234   VLDL 37 02/27/2020 0234   LDLCALC 55 12/10/2022 0856    Please consider ONE of the following recommendations:  Initiate high intensity statin Atorvastatin 40 mg once daily, #90, 3 refills   Rosuvastatin 20 mg once daily, #90, 3 refills    Initiate moderate intensity          statin with reduced frequency if prior          statin intolerance 1x weekly, #13, 3 refills   2x weekly, #26, 3 refills   3x weekly, #39, 3 refills    Code for past statin intolerance or  other exclusions (required annually)  Provider Requirements: Associate code during an office visit or telehealth encounter  Drug Induced Myopathy G72.0   Myopathy, unspecified G72.9   Myositis, unspecified M60.9   Rhabdomyolysis M62.82   Cirrhosis of liver K74.69   Prediabetes R73.03   PCOS E28.2   Plan: Route note to Patient's  Provider prior to the upcoming appointment.  Beecher Mcardle, PharmD, BCACP [facility] Clinical Pharmacist [phone]

## 2023-04-09 ENCOUNTER — Ambulatory Visit: Payer: 59 | Admitting: Nurse Practitioner

## 2023-04-09 NOTE — Progress Notes (Deleted)
Hershal Coria Lashaya Kienitz,acting as a Neurosurgeon for [name], FNP.,have documented all relevant documentation on the behalf of [name], FNP,as directed by  [name], FNP while in the presence of [name], FNP.  Subjective:  Patient ID: Margaret Webster , female    DOB: 1961/06/04 , 62 y.o.   MRN: [id number]  No chief complaint on file.   HPI  Patient presents today for a BP and chol check, patient reports compliance with medications and has no other concerns today. Patient denies any chest pain, SOB, or headaches.     Past Medical History:  Diagnosis Date  . Arthritis    bil shoulders  . Bronchitis   . Chronic back pain   . Chronic kidney disease   . Diabetes mellitus    stopped Mrtformin 1 mo ago, made her "sick"  . Fatty liver   . Hypertension   . Hypothyroidism   . Stroke (HCC)    either 2019/2020  . Thyroid disease   . Trigger thumb of right hand      Family History  Problem Relation Age of Onset  . Diabetes Mother   . Cystic fibrosis Sister   . Cancer Sister        ?  Marland Kitchen Breast cancer Maternal Aunt   . Heart disease Maternal Grandmother   . Clotting disorder Maternal Grandfather      Current Outpatient Medications:  .  acetaminophen (TYLENOL) 500 MG tablet, Take 1 tablet (500 mg total) by mouth every 6 (six) hours as needed., Disp: 30 tablet, Rfl: 0 .  amLODipine (NORVASC) 2.5 MG tablet, TAKE 1 TABLET BY MOUTH ONCE  DAILY, Disp: 100 tablet, Rfl: 2 .  aspirin EC 81 MG tablet, Take 81 mg by mouth in the morning., Disp: , Rfl:  .  cyclobenzaprine (FLEXERIL) 10 MG tablet, Take 1 tablet (10 mg total) by mouth 3 (three) times daily as needed for muscle spasms., Disp: 30 tablet, Rfl: 0 .  diclofenac Sodium (VOLTAREN) 1 % GEL, Apply topically 4 (four) times daily as needed (pain.)., Disp: , Rfl:  .  DULoxetine (CYMBALTA) 30 MG capsule, Take 60 mg by mouth daily at 2 PM., Disp: , Rfl:  .  Evolocumab (REPATHA SURECLICK) 140 MG/ML SOAJ, Inject 1 Dose into the skin every  14 (fourteen) days., Disp: 2 mL, Rfl: 11 .  ezetimibe (ZETIA) 10 MG tablet, Take 1 tablet (10 mg total) by mouth daily. Please call [phone] to schedule an overdue appointment for future refills. Thank you. 2nd attempt., Disp: 15 tablet, Rfl: 0 .  Liraglutide -Weight Management (SAXENDA) 18 MG/3ML SOPN, Inject 3 mg into the skin daily., Disp: 15 mL, Rfl: 1 .  losartan (COZAAR) 25 MG tablet, Take 0.5 tablets (12.5 mg total) by mouth daily. (Patient taking differently: Take 25 mg by mouth in the morning.), Disp: 90 tablet, Rfl: 1 .  metFORMIN (GLUCOPHAGE-XR) 500 MG 24 hr tablet, TAKE 1 TABLET BY MOUTH ONCE  DAILY WITH BREAKFAST, Disp: 100 tablet, Rfl: 2 .  NEXLETOL 180 MG TABS, TAKE 1 TABLET BY MOUTH AT  BEDTIME (Patient taking differently: Take 1 tablet by mouth in the morning.), Disp: 100 tablet, Rfl: 2 .  promethazine-dextromethorphan (PROMETHAZINE-DM) 6.25-15 MG/5ML syrup, Take 5 mLs by mouth 4 (four) times daily as needed for cough. (Patient not taking: Reported on 12/19/2022), Disp: 118 mL, Rfl: 0 .  triamcinolone cream (KENALOG) 0.5 %, Apply 1 Application topically 2 (two) times daily., Disp: 30 g, Rfl: 0 .  Vitamin D,  Ergocalciferol, (DRISDOL) 1.25 MG (50000 UNIT) CAPS capsule, Take 1 capsule (50,000 Units total) by mouth every Monday., Disp: 12 capsule, Rfl: 1   Allergies  Allergen Reactions  . Sulfa Antibiotics Other (See Comments)    Hard to breathe  . Amoxil [Amoxicillin]   . Ampicillin Other (See Comments) and Hypertension    Bloody stools   . Crestor [Rosuvastatin] Other (See Comments)    Makes her mouth numb and causes side pain  . Ibuprofen Other (See Comments)    Stomach upset  . Polytrim [Polymyxin B-Trimethoprim] Itching and Swelling    redness  . Influenza Vaccines Hives  . Lipitor [Atorvastatin Calcium] Other (See Comments)    Muscle ache   . Naproxen Nausea And Vomiting     Review of Systems   There were no vitals filed for this visit. There is no height or  weight on file to calculate BMI.  Wt Readings from Last 3 Encounters:  04/06/23 218 lb (98.9 kg)  03/30/23 218 lb (98.9 kg)  03/02/23 220 lb (99.8 kg)    The ASCVD Risk score (Arnett DK, et al., 2019) failed to calculate for the following reasons:   The patient has a prior MI or stroke diagnosis  Objective:  Physical Exam      Assessment And Plan:  1. Benign hypertension with CKD (chronic kidney disease) stage III (HCC)  2. Prediabetes  3. Mixed hyperlipidemia    No follow-ups on file.  Patient was given opportunity to ask questions. Patient verbalized understanding of the plan and was able to repeat key elements of the plan. All questions were answered to their satisfaction.  [name], FNP  I, [name], FNP, have reviewed all documentation for this visit. The documentation on 04/09/23 for the exam, diagnosis, procedures, and orders are all accurate and complete.   IF YOU HAVE BEEN REFERRED TO A SPECIALIST, IT MAY TAKE 1-2 WEEKS TO SCHEDULE/PROCESS THE REFERRAL. IF YOU HAVE NOT HEARD FROM US/SPECIALIST IN TWO WEEKS, PLEASE GIVE [place] A CALL AT [phone].

## 2023-04-13 DIAGNOSIS — M19012 Primary osteoarthritis, left shoulder: Secondary | ICD-10-CM | POA: Diagnosis not present

## 2023-04-13 DIAGNOSIS — M25512 Pain in left shoulder: Secondary | ICD-10-CM | POA: Diagnosis not present

## 2023-04-13 DIAGNOSIS — M25511 Pain in right shoulder: Secondary | ICD-10-CM | POA: Diagnosis not present

## 2023-04-13 DIAGNOSIS — M19011 Primary osteoarthritis, right shoulder: Secondary | ICD-10-CM | POA: Diagnosis not present

## 2023-04-13 DIAGNOSIS — G8929 Other chronic pain: Secondary | ICD-10-CM | POA: Diagnosis not present

## 2023-04-14 DIAGNOSIS — E785 Hyperlipidemia, unspecified: Secondary | ICD-10-CM | POA: Diagnosis not present

## 2023-04-15 DIAGNOSIS — M25551 Pain in right hip: Secondary | ICD-10-CM | POA: Diagnosis not present

## 2023-04-15 DIAGNOSIS — M25511 Pain in right shoulder: Secondary | ICD-10-CM | POA: Diagnosis not present

## 2023-04-15 DIAGNOSIS — G8929 Other chronic pain: Secondary | ICD-10-CM | POA: Diagnosis not present

## 2023-04-15 DIAGNOSIS — M25512 Pain in left shoulder: Secondary | ICD-10-CM | POA: Diagnosis not present

## 2023-04-15 LAB — NMR, LIPOPROFILE
Cholesterol, Total: 225 mg/dL — ABNORMAL HIGH (ref 100–199)
HDL Particle Number: 32.7 umol/L (ref >=30.5)
HDL-C: 48 mg/dL (ref >39)
LDL Particle Number: 2051 nmol/L — ABNORMAL HIGH (ref <1000)
LDL Size: 20.5 nm — ABNORMAL LOW (ref >20.5)
LDL-C (NIH Calc): 153 mg/dL — ABNORMAL HIGH (ref 0–99)
LP-IR Score: 60 — ABNORMAL HIGH (ref <=45)
Small LDL Particle Number: 1115 nmol/L — ABNORMAL HIGH (ref <=527)
Triglycerides: 134 mg/dL (ref 0–149)

## 2023-04-17 ENCOUNTER — Other Ambulatory Visit: Payer: Self-pay | Admitting: *Deleted

## 2023-04-17 DIAGNOSIS — E782 Mixed hyperlipidemia: Secondary | ICD-10-CM

## 2023-04-17 DIAGNOSIS — Z8673 Personal history of transient ischemic attack (TIA), and cerebral infarction without residual deficits: Secondary | ICD-10-CM

## 2023-04-17 DIAGNOSIS — Z789 Other specified health status: Secondary | ICD-10-CM

## 2023-04-17 MED ORDER — NEXLETOL 180 MG PO TABS: 1.0000 | ORAL_TABLET | Freq: Every day | ORAL | 2 refills | Status: DC

## 2023-04-17 MED ORDER — EZETIMIBE 10 MG PO TABS: 10.0000 mg | ORAL_TABLET | Freq: Every day | ORAL | 2 refills | Status: DC

## 2023-04-17 MED ORDER — REPATHA SURECLICK 140 MG/ML ~~LOC~~ SOAJ: 140.0000 mg | SUBCUTANEOUS | 5 refills | Status: DC

## 2023-04-21 NOTE — Progress Notes (Signed)
[facility] PREP Weekly Session  Patient Details  Name: KATTI PELLE MRN: [id number] Date of Birth: 03/02/1961 Age: 62 y.o. PCP: [name], FNP  Vitals:   04/20/23 1030  Weight: 214 lb (97.1 kg)     [facility] Weekly seesion - 04/21/23 1000       [facility] "PREP" Location   [facility] "PREP" Location [facility]      Weekly Session   Topic Discussed Calorie breakdown    Minutes exercised this week 255 minutes    Classes attended to date 14             Pam Jerral Bonito 04/21/2023, 10:23 AM

## 2023-05-12 DIAGNOSIS — M25551 Pain in right hip: Secondary | ICD-10-CM | POA: Diagnosis not present

## 2023-05-12 DIAGNOSIS — G8929 Other chronic pain: Secondary | ICD-10-CM | POA: Diagnosis not present

## 2023-05-12 DIAGNOSIS — M25511 Pain in right shoulder: Secondary | ICD-10-CM | POA: Diagnosis not present

## 2023-05-12 DIAGNOSIS — M25512 Pain in left shoulder: Secondary | ICD-10-CM | POA: Diagnosis not present

## 2023-05-13 DIAGNOSIS — M25551 Pain in right hip: Secondary | ICD-10-CM | POA: Diagnosis not present

## 2023-05-13 DIAGNOSIS — G8929 Other chronic pain: Secondary | ICD-10-CM | POA: Diagnosis not present

## 2023-05-19 NOTE — Progress Notes (Signed)
[facility] PREP Evaluation  Patient Details  Name: Margaret Webster MRN: [id number] Date of Birth: 1960-10-31 Age: 62 y.o. PCP: [name], FNP  Vitals:   05/19/23 1600  BP: 128/62  Pulse: (!) 57  SpO2: 97%  Weight: 217 lb 9.6 oz (98.7 kg)     [facility] Eval - 05/19/23 1600       [facility] "PREP" Location   [facility] "PREP" Location [facility]      Referral    Referring Provider [name]    Program Start Date 02/05/23    Program End Date 05/11/23      Measurement   Waist Circumference 47 inches    Waist Circumference End Program 46.5 inches    Hip Circumference 46 inches    Hip Circumference End Program 49 inches    Body fat 45.4 percent      Information for Trainer   Goals Joined Y. working with PT for hip pain      Mobility and Daily Activities   I find it easy to walk up or down two or more flights of stairs. 2    I have no trouble taking out the trash. 4    I do housework such as vacuuming and dusting on my own without difficulty. 4    I can easily lift a gallon of milk (8lbs). 2    I can easily walk a mile. 2    I have no trouble reaching into high cupboards or reaching down to pick up something from the floor. 2    I do not have trouble doing out-door work such as Loss adjuster, chartered, raking leaves, or gardening. 2      Mobility and Daily Activities   I feel younger than my age. 2    I feel independent. 4    I feel energetic. 2    I live an active life.  2    I feel strong. 4    I feel healthy. 4    I feel active as other people my age. 4      How fit and strong are you.   Fit and Strong Total Score 40            Past Medical History:  Diagnosis Date   Arthritis    bil shoulders   Bronchitis    Chronic back pain    Chronic kidney disease    Diabetes mellitus    stopped Mrtformin 1 mo ago, made her "sick"   Fatty liver    Hypertension    Hypothyroidism    Stroke [facility])    either 2019/2020   Thyroid disease    Trigger thumb of right hand    Past Surgical  History:  Procedure Laterality Date   BACK SURGERY  1993   CARPAL TUNNEL RELEASE  08/2015   CARPAL TUNNEL RELEASE Left 09/10/2017   Procedure: CARPAL TUNNEL RELEASE;  Surgeon: Betha Loa, MD;  Location: [facility];  Service: Orthopedics;  Laterality: Left;   CERVICAL SPINE SURGERY Right 11/2012   ESOPHAGOGASTRODUODENOSCOPY (EGD) WITH PROPOFOL N/A 08/01/2022   Procedure: ESOPHAGOGASTRODUODENOSCOPY (EGD) WITH PROPOFOL;  Surgeon: Jeani Hawking, MD;  Location: [facility];  Service: Gastroenterology;  Laterality: N/A;   OOPHORECTOMY     TRIGGER FINGER RELEASE Right 08/06/2017   Procedure: RIGHT THUMB TRIGGER RELEASE;  Surgeon: Betha Loa, MD;  Location: [facility];  Service: Orthopedics;  Laterality: Right;   ULNAR COLLATERAL LIGAMENT REPAIR Right 03/21/2021   Procedure:  RIGHT THUMB ULNAR COLLATERAL LIGAMENT REPAIR;  Surgeon: Betha Loa, MD;  Location: [facility];  Service: Orthopedics;  Laterality: Right;   UPPER ESOPHAGEAL ENDOSCOPIC ULTRASOUND (EUS) N/A 08/01/2022   Procedure: UPPER ESOPHAGEAL ENDOSCOPIC ULTRASOUND (EUS);  Surgeon: Jeani Hawking, MD;  Location: [facility];  Service: Gastroenterology;  Laterality: N/A;   Social History   Tobacco Use  Smoking Status Every Day   Current packs/day: 0.25   Average packs/day: 0.3 packs/day for 45.0 years (11.3 ttl pk-yrs)   Types: Cigarettes  Smokeless Tobacco Never  Tobacco Comments   4 cig a day, not interested in chantix, 11/27/22 - she is smoking one cigarette a day  Attended 16 sessions, 7 educational Fit testing: Cardio march: 260 to 182 Sit to stand: 10 with hands down, 6 with arms crossed Bicep curls: 16 to 12 Encouraged to continue to exercise and work on balance Currently doing PT for right hip pain s/p injections.  Joined Y and plans on continuing to exercise   Bonnye Fava 05/19/2023, 4:58 PM

## 2023-05-20 DIAGNOSIS — G8929 Other chronic pain: Secondary | ICD-10-CM | POA: Diagnosis not present

## 2023-05-20 DIAGNOSIS — M25512 Pain in left shoulder: Secondary | ICD-10-CM | POA: Diagnosis not present

## 2023-05-20 DIAGNOSIS — M25511 Pain in right shoulder: Secondary | ICD-10-CM | POA: Diagnosis not present

## 2023-05-20 DIAGNOSIS — M25551 Pain in right hip: Secondary | ICD-10-CM | POA: Diagnosis not present

## 2023-05-21 ENCOUNTER — Ambulatory Visit (INDEPENDENT_AMBULATORY_CARE_PROVIDER_SITE_OTHER): Payer: 59 | Admitting: Nurse Practitioner

## 2023-05-21 ENCOUNTER — Encounter: Payer: Self-pay | Admitting: Nurse Practitioner

## 2023-05-21 VITALS — BP 130/80 | HR 80 | Temp 97.8°F | Ht 65.0 in | Wt 214.0 lb

## 2023-05-21 DIAGNOSIS — E559 Vitamin D deficiency, unspecified: Secondary | ICD-10-CM | POA: Diagnosis not present

## 2023-05-21 DIAGNOSIS — R21 Rash and other nonspecific skin eruption: Secondary | ICD-10-CM | POA: Diagnosis not present

## 2023-05-21 DIAGNOSIS — N183 Chronic kidney disease, stage 3 unspecified: Secondary | ICD-10-CM | POA: Diagnosis not present

## 2023-05-21 DIAGNOSIS — I739 Peripheral vascular disease, unspecified: Secondary | ICD-10-CM

## 2023-05-21 DIAGNOSIS — I129 Hypertensive chronic kidney disease with stage 1 through stage 4 chronic kidney disease, or unspecified chronic kidney disease: Secondary | ICD-10-CM | POA: Diagnosis not present

## 2023-05-21 DIAGNOSIS — E669 Obesity, unspecified: Secondary | ICD-10-CM | POA: Insufficient documentation

## 2023-05-21 DIAGNOSIS — R7303 Prediabetes: Secondary | ICD-10-CM

## 2023-05-21 DIAGNOSIS — E039 Hypothyroidism, unspecified: Secondary | ICD-10-CM

## 2023-05-21 DIAGNOSIS — R0982 Postnasal drip: Secondary | ICD-10-CM

## 2023-05-21 DIAGNOSIS — E782 Mixed hyperlipidemia: Secondary | ICD-10-CM | POA: Diagnosis not present

## 2023-05-21 DIAGNOSIS — Z2821 Immunization not carried out because of patient refusal: Secondary | ICD-10-CM | POA: Insufficient documentation

## 2023-05-21 MED ORDER — WEGOVY 0.5 MG/0.5ML ~~LOC~~ SOAJ: 0.5000 mg | SUBCUTANEOUS | 0 refills | Status: DC

## 2023-05-21 MED ORDER — TRIAMCINOLONE ACETONIDE 0.5 % EX CREA: 1.0000 | TOPICAL_CREAM | Freq: Two times a day (BID) | CUTANEOUS | 0 refills | Status: DC

## 2023-05-21 MED ORDER — CETIRIZINE HCL 10 MG PO TABS: 10.0000 mg | ORAL_TABLET | Freq: Every day | ORAL | 2 refills | Status: DC

## 2023-05-21 MED ORDER — WEGOVY 0.25 MG/0.5ML ~~LOC~~ SOAJ: 0.2500 mg | SUBCUTANEOUS | 0 refills | Status: DC

## 2023-05-21 NOTE — Progress Notes (Signed)
[name], CMA,acting as a Neurosurgeon for [name], FNP.,have documented all relevant documentation on the behalf of [name], FNP,as directed by  [name], FNP while in the presence of [name], FNP.  Subjective:  Patient ID: Margaret Webster , female    DOB: Oct 23, 1961 , 62 y.o.   MRN: [id number]  Chief Complaint  Patient presents with   Hypertension    HPI  Patient presents today for a bp, chol, and pre DM follow up. Patient reports compliance with medications.  Patient denies any chest pain, SOB, or headaches. Patient has no other concerns today.  She has been going to the Nephrologist for her blood pressure. She has also been having back and hip pain, Dr. Sharlet Salina has given her injections. She has not been getting her Repatha and her cholesterol levels were up. She has completed PREP and restarted her membership at the [employer] Readings from Last 3 Encounters: 05/21/23 : 214 lb (97.1 kg) 05/19/23 : 217 lb 9.6 oz (98.7 kg) 04/20/23 : 214 lb (97.1 kg)    BP Readings from Last 3 Encounters: 05/21/23 : 130/80 05/19/23 : 128/62 02/05/23 : (!) 158/70       Past Medical History:  Diagnosis Date   Arthritis    bil shoulders   Bronchitis    Chronic back pain    Chronic kidney disease    Diabetes mellitus    stopped Mrtformin 1 mo ago, made her "sick"   Fatty liver    Hypertension    Hypothyroidism    Stroke [facility])    either 2019/2020   Thyroid disease    Trigger thumb of right hand      Family History  Problem Relation Age of Onset   Diabetes Mother    Cystic fibrosis Sister    Cancer Sister        ?   Breast cancer Maternal Aunt    Heart disease Maternal Grandmother    Clotting disorder Maternal Grandfather      Current Outpatient Medications:    acetaminophen (TYLENOL) 500 MG tablet, Take 1 tablet (500 mg total) by mouth every 6 (six) hours as needed., Disp: 30 tablet, Rfl: 0   amLODipine (NORVASC) 2.5 MG tablet, TAKE 1 TABLET BY MOUTH ONCE   DAILY, Disp: 100 tablet, Rfl: 2   aspirin EC 81 MG tablet, Take 81 mg by mouth in the morning., Disp: , Rfl:    Bempedoic Acid (NEXLETOL) 180 MG TABS, Take 1 tablet (180 mg total) by mouth at bedtime., Disp: 90 tablet, Rfl: 2   cetirizine (ZYRTEC ALLERGY) 10 MG tablet, Take 1 tablet (10 mg total) by mouth daily., Disp: 30 tablet, Rfl: 2   cyclobenzaprine (FLEXERIL) 10 MG tablet, Take 1 tablet (10 mg total) by mouth 3 (three) times daily as needed for muscle spasms., Disp: 30 tablet, Rfl: 0   diclofenac Sodium (VOLTAREN) 1 % GEL, Apply topically 4 (four) times daily as needed (pain.)., Disp: , Rfl:    DULoxetine (CYMBALTA) 30 MG capsule, Take 60 mg by mouth daily at 2 PM., Disp: , Rfl:    ezetimibe (ZETIA) 10 MG tablet, Take 1 tablet (10 mg total) by mouth daily., Disp: 90 tablet, Rfl: 2   losartan (COZAAR) 25 MG tablet, Take 0.5 tablets (12.5 mg total) by mouth daily. (Patient taking differently: Take 25 mg by mouth in the morning.), Disp: 90 tablet, Rfl: 1   metFORMIN (GLUCOPHAGE-XR) 500 MG 24 hr tablet, TAKE 1 TABLET BY MOUTH  ONCE  DAILY WITH BREAKFAST, Disp: 100 tablet, Rfl: 2   Semaglutide-Weight Management (WEGOVY) 0.5 MG/0.5ML SOAJ, Inject 0.5 mg into the skin once a week., Disp: 2 mL, Rfl: 0   Evolocumab (REPATHA SURECLICK) 140 MG/ML SOAJ, Inject 140 mg into the skin every 14 (fourteen) days. (Patient not taking: Reported on 05/21/2023), Disp: 2 mL, Rfl: 5   promethazine-dextromethorphan (PROMETHAZINE-DM) 6.25-15 MG/5ML syrup, Take 5 mLs by mouth 4 (four) times daily as needed for cough. (Patient not taking: Reported on 12/19/2022), Disp: 118 mL, Rfl: 0   triamcinolone cream (KENALOG) 0.5 %, Apply 1 Application topically 2 (two) times daily., Disp: 30 g, Rfl: 0   Vitamin D, Ergocalciferol, (DRISDOL) 1.25 MG (50000 UNIT) CAPS capsule, Take 1 capsule (50,000 Units total) by mouth every Monday. (Patient not taking: Reported on 05/21/2023), Disp: 12 capsule, Rfl: 1   Allergies  Allergen Reactions    Sulfa Antibiotics Other (See Comments)    Hard to breathe   Amoxil [Amoxicillin]    Ampicillin Other (See Comments) and Hypertension    Bloody stools    Crestor [Rosuvastatin] Other (See Comments)    Makes her mouth numb and causes side pain   Ibuprofen Other (See Comments)    Stomach upset   Polytrim [Polymyxin B-Trimethoprim] Itching and Swelling    redness   Influenza Vaccines Hives   Lipitor [Atorvastatin Calcium] Other (See Comments)    Muscle ache    Naproxen Nausea And Vomiting     Review of Systems  Constitutional: Negative.  Negative for fatigue.  HENT: Negative.    Eyes: Negative.   Respiratory: Negative.    Cardiovascular: Negative.  Negative for chest pain, palpitations and leg swelling.  Gastrointestinal: Negative.   Endocrine: Negative.  Negative for polydipsia, polyphagia and polyuria.  Genitourinary: Negative.   Musculoskeletal: Negative.   Skin: Negative.   Allergic/Immunologic: Negative.   Neurological: Negative.  Negative for dizziness and headaches.  Hematological: Negative.   Psychiatric/Behavioral: Negative.  The patient is not nervous/anxious.      Today's Vitals   05/21/23 1517  BP: 130/80  Pulse: 80  Temp: 97.8 F (36.6 C)  TempSrc: Oral  Weight: 214 lb (97.1 kg)  Height: 5\' 5"  (1.651 m)  PainSc: 0-No pain   Body mass index is 35.61 kg/m.  Wt Readings from Last 3 Encounters:  05/21/23 214 lb (97.1 kg)  05/19/23 217 lb 9.6 oz (98.7 kg)  04/20/23 214 lb (97.1 kg)     Objective:  Physical Exam Constitutional:      Appearance: Normal appearance.  HENT:     Head: Normocephalic and atraumatic.  Cardiovascular:     Rate and Rhythm: Normal rate and regular rhythm.     Pulses: Normal pulses.     Heart sounds: Normal heart sounds. No murmur heard. Pulmonary:     Effort: Pulmonary effort is normal. No respiratory distress.     Breath sounds: Normal breath sounds. No wheezing.  Skin:    General: Skin is warm and dry.     Capillary  Refill: Capillary refill takes less than 2 seconds.  Neurological:     General: No focal deficit present.     Mental Status: She is alert and oriented to person, place, and time.     Cranial Nerves: No cranial nerve deficit.     Motor: No weakness.  Psychiatric:        Mood and Affect: Mood normal.        Behavior: Behavior normal.  Thought Content: Thought content normal.        Judgment: Judgment normal.         Assessment And Plan:  Benign hypertension with CKD (chronic kidney disease) stage III (HCC) Assessment & Plan: Blood pressure is at border of goal, encouraged to focus on lifestyle modifications   Prediabetes Assessment & Plan: Will check HgbA1c, continue metformin.   Orders: -     Hemoglobin A1c  Mixed hyperlipidemia Assessment & Plan: Continue Repatha and f/u with Cardiology   Rash and nonspecific skin eruption Assessment & Plan: Will refill steroid cream  Orders: -     Triamcinolone Acetonide; Apply 1 Application topically 2 (two) times daily.  Dispense: 30 g; Refill: 0  Post-nasal drainage -     Cetirizine HCl; Take 1 tablet (10 mg total) by mouth daily.  Dispense: 30 tablet; Refill: 2  Acquired hypothyroidism Assessment & Plan: Will check thyroid levels, no current medications  Orders: -     TSH + free T4  PAD (peripheral artery disease) (HCC) Assessment & Plan: Continue statin, doing well  Orders: -     ZOXWRU; Inject 0.5 mg into the skin once a week.  Dispense: 2 mL; Refill: 0  Vitamin D deficiency Assessment & Plan: Will check vitamin D level and supplement as needed.    Also encouraged to spend 15 minutes in the sun daily.    Orders: -     VITAMIN D 25 Hydroxy (Vit-D Deficiency, Fractures)  Obesity (BMI 35.0-39.9 without comorbidity) Assessment & Plan: She is encouraged to strive for BMI less than 30 to decrease cardiac risk. Advised to aim for at least 150 minutes of exercise per week. I have also sent a prescription for  [facility], she also has PAD. Will provide sample once she is approved, return for teaching   Herpes zoster vaccination declined Assessment & Plan: Declines shingrix, educated on disease process and is aware if he changes his mind to notify office     Return for 6 month bp check.   Patient was given opportunity to ask questions. Patient verbalized understanding of the plan and was able to repeat key elements of the plan. All questions were answered to their satisfaction.    Jeanell Sparrow, FNP, have reviewed all documentation for this visit. The documentation on 05/21/23 for the exam, diagnosis, procedures, and orders are all accurate and complete.   IF YOU HAVE BEEN REFERRED TO A SPECIALIST, IT MAY TAKE 1-2 WEEKS TO SCHEDULE/PROCESS THE REFERRAL. IF YOU HAVE NOT HEARD FROM US/SPECIALIST IN TWO WEEKS, PLEASE GIVE [place] A CALL AT [phone] X [phone].

## 2023-05-28 DIAGNOSIS — G8929 Other chronic pain: Secondary | ICD-10-CM | POA: Diagnosis not present

## 2023-05-28 DIAGNOSIS — M25551 Pain in right hip: Secondary | ICD-10-CM | POA: Diagnosis not present

## 2023-05-28 DIAGNOSIS — M25512 Pain in left shoulder: Secondary | ICD-10-CM | POA: Diagnosis not present

## 2023-05-28 DIAGNOSIS — M25511 Pain in right shoulder: Secondary | ICD-10-CM | POA: Diagnosis not present

## 2023-05-31 MED ORDER — LEVOTHYROXINE SODIUM 25 MCG PO TABS: 25.0000 ug | ORAL_TABLET | Freq: Every day | ORAL | 2 refills | Status: DC

## 2023-05-31 MED ORDER — VITAMIN D (ERGOCALCIFEROL) 1.25 MG (50000 UNIT) PO CAPS: 50000.0000 [IU] | ORAL_CAPSULE | ORAL | 1 refills | Status: DC

## 2023-05-31 NOTE — Assessment & Plan Note (Signed)
Continue Repatha and f/u with Cardiology

## 2023-05-31 NOTE — Assessment & Plan Note (Signed)
Declines shingrix, educated on disease process and is aware if he changes his mind to notify office  

## 2023-05-31 NOTE — Assessment & Plan Note (Signed)
Blood pressure is at border of goal, encouraged to focus on lifestyle modifications

## 2023-05-31 NOTE — Assessment & Plan Note (Signed)
Will check thyroid levels, no current medications

## 2023-05-31 NOTE — Assessment & Plan Note (Signed)
Will check vitamin D level and supplement as needed.    Also encouraged to spend 15 minutes in the sun daily.   

## 2023-05-31 NOTE — Assessment & Plan Note (Signed)
Will refill steroid cream

## 2023-05-31 NOTE — Assessment & Plan Note (Addendum)
Will check HgbA1c, continue metformin.

## 2023-05-31 NOTE — Assessment & Plan Note (Signed)
She is encouraged to strive for BMI less than 30 to decrease cardiac risk. Advised to aim for at least 150 minutes of exercise per week. I have also sent a prescription for [facility], she also has PAD. Will provide sample once she is approved, return for teaching

## 2023-05-31 NOTE — Assessment & Plan Note (Signed)
Continue statin, doing well.

## 2023-06-02 DIAGNOSIS — M25551 Pain in right hip: Secondary | ICD-10-CM | POA: Diagnosis not present

## 2023-06-02 DIAGNOSIS — G8929 Other chronic pain: Secondary | ICD-10-CM | POA: Diagnosis not present

## 2023-06-02 DIAGNOSIS — M25511 Pain in right shoulder: Secondary | ICD-10-CM | POA: Diagnosis not present

## 2023-06-02 DIAGNOSIS — M25512 Pain in left shoulder: Secondary | ICD-10-CM | POA: Diagnosis not present

## 2023-06-09 DIAGNOSIS — M25511 Pain in right shoulder: Secondary | ICD-10-CM | POA: Diagnosis not present

## 2023-06-09 DIAGNOSIS — M25512 Pain in left shoulder: Secondary | ICD-10-CM | POA: Diagnosis not present

## 2023-06-09 DIAGNOSIS — G8929 Other chronic pain: Secondary | ICD-10-CM | POA: Diagnosis not present

## 2023-06-09 DIAGNOSIS — M25551 Pain in right hip: Secondary | ICD-10-CM | POA: Diagnosis not present

## 2023-06-12 ENCOUNTER — Other Ambulatory Visit: Payer: Self-pay | Admitting: Nurse Practitioner

## 2023-06-12 DIAGNOSIS — R7303 Prediabetes: Secondary | ICD-10-CM

## 2023-06-17 ENCOUNTER — Ambulatory Visit (INDEPENDENT_AMBULATORY_CARE_PROVIDER_SITE_OTHER): Payer: 59

## 2023-06-17 DIAGNOSIS — Z0000 Encounter for general adult medical examination without abnormal findings: Secondary | ICD-10-CM

## 2023-06-17 NOTE — Patient Instructions (Signed)
Margaret Webster , Thank you for taking time to come for your Medicare Wellness Visit. I appreciate your ongoing commitment to your health goals. Please review the following plan we discussed and let me know if I can assist you in the future.   Referrals/Orders/Follow-Ups/Clinician Recommendations: none  This is a list of the screening recommended for you and due dates:  Health Maintenance  Topic Date Due   Colon Cancer Screening  12/07/2021   COVID-19 Vaccine (4 - 2023-24 season) 06/27/2022   Zoster (Shingles) Vaccine (2 of 2) 07/03/2022   Complete foot exam   12/03/2022   Flu Shot  05/28/2023   Eye exam for diabetics  07/31/2023   Hemoglobin A1C  11/21/2023   Yearly kidney function blood test for diabetes  11/23/2023   Yearly kidney health urinalysis for diabetes  12/11/2023   Medicare Annual Wellness Visit  06/16/2024   Mammogram  07/09/2024   Pap Smear  12/03/2024   DTaP/Tdap/Td vaccine (5 - Td or Tdap) 11/27/2032   Hepatitis C Screening  Completed   HIV Screening  Completed   HPV Vaccine  Aged Out    Advanced directives: (ACP Link)Information on Advanced Care Planning can be found at [facility] [place] Advance Health Care Directives Advance Health Care Directives ([email or web address]   Next Medicare Annual Wellness Visit scheduled for next year: No, office will schedule appointment  insert Preventive Care Attachment Reference

## 2023-06-17 NOTE — Progress Notes (Signed)
Subjective:   Margaret Webster is a 62 y.o. female who presents for Medicare Annual (Subsequent) preventive examination.  Visit Complete: Virtual  I connected with  Margaret Webster on 06/17/23 by a audio enabled telemedicine application and verified that I am speaking with the correct person using two identifiers.  Patient Location: Home  Provider Location: Office/Clinic  I discussed the limitations of evaluation and management by telemedicine. The patient expressed understanding and agreed to proceed.  Vital Signs: Unable to obtain new vitals due to this being a telehealth visit.  Review of Systems     Cardiac Risk Factors include: advanced age (>29men, >2 women);diabetes mellitus;hypertension     Objective:    Today's Vitals   06/17/23 0811  PainSc: 7    There is no height or weight on file to calculate BMI.     06/17/2023    8:20 AM 11/22/2022    2:01 PM 08/01/2022    7:12 AM 05/08/2022    2:52 PM 07/23/2021   12:09 AM 06/22/2021    1:46 PM 04/24/2021    9:35 AM  Advanced Directives  Does Patient Have a Medical Advance Directive? No No No No No No No  Would patient like information on creating a medical advance directive?  No - Patient declined Yes (MAU/Ambulatory/Procedural Areas - Information given) No - Patient declined  No - Patient declined No - Patient declined    Current Medications (verified) Outpatient Encounter Medications as of 06/17/2023  Medication Sig   acetaminophen (TYLENOL) 500 MG tablet Take 1 tablet (500 mg total) by mouth every 6 (six) hours as needed.   amLODipine (NORVASC) 2.5 MG tablet TAKE 1 TABLET BY MOUTH ONCE  DAILY   aspirin EC 81 MG tablet Take 81 mg by mouth in the morning.   cetirizine (ZYRTEC ALLERGY) 10 MG tablet Take 1 tablet (10 mg total) by mouth daily.   cyclobenzaprine (FLEXERIL) 10 MG tablet Take 1 tablet (10 mg total) by mouth 3 (three) times daily as needed for muscle spasms.   diclofenac Sodium (VOLTAREN) 1 % GEL Apply  topically 4 (four) times daily as needed (pain.).   DULoxetine (CYMBALTA) 30 MG capsule Take 60 mg by mouth daily at 2 PM.   Evolocumab (REPATHA SURECLICK) 140 MG/ML SOAJ Inject 140 mg into the skin every 14 (fourteen) days.   ezetimibe (ZETIA) 10 MG tablet Take 1 tablet (10 mg total) by mouth daily.   levothyroxine (SYNTHROID) 25 MCG tablet Take 1 tablet (25 mcg total) by mouth daily before breakfast.   losartan (COZAAR) 25 MG tablet Take 0.5 tablets (12.5 mg total) by mouth daily. (Patient taking differently: Take 25 mg by mouth in the morning.)   metFORMIN (GLUCOPHAGE-XR) 500 MG 24 hr tablet TAKE 1 TABLET BY MOUTH ONCE  DAILY WITH BREAKFAST   triamcinolone cream (KENALOG) 0.5 % Apply 1 Application topically 2 (two) times daily.   Vitamin D, Ergocalciferol, (DRISDOL) 1.25 MG (50000 UNIT) CAPS capsule Take 1 capsule (50,000 Units total) by mouth every Monday.   Bempedoic Acid (NEXLETOL) 180 MG TABS Take 1 tablet (180 mg total) by mouth at bedtime. (Patient not taking: Reported on 06/17/2023)   promethazine-dextromethorphan (PROMETHAZINE-DM) 6.25-15 MG/5ML syrup Take 5 mLs by mouth 4 (four) times daily as needed for cough. (Patient not taking: Reported on 12/19/2022)   Semaglutide-Weight Management (WEGOVY) 0.5 MG/0.5ML SOAJ Inject 0.5 mg into the skin once a week. (Patient not taking: Reported on 06/17/2023)   No facility-administered encounter medications on file as  of 06/17/2023.    Allergies (verified) Sulfa antibiotics, Amoxil [amoxicillin], Ampicillin, Crestor [rosuvastatin], Ibuprofen, Polytrim [polymyxin b-trimethoprim], Influenza vaccines, Lipitor [atorvastatin calcium], and Naproxen   History: Past Medical History:  Diagnosis Date   Arthritis    bil shoulders   Bronchitis    Chronic back pain    Chronic kidney disease    Diabetes mellitus    stopped Mrtformin 1 mo ago, made her "sick"   Fatty liver    Hypertension    Hypothyroidism    Stroke [facility])    either 2019/2020   Thyroid  disease    Trigger thumb of right hand    Past Surgical History:  Procedure Laterality Date   BACK SURGERY  1993   CARPAL TUNNEL RELEASE  08/2015   CARPAL TUNNEL RELEASE Left 09/10/2017   Procedure: CARPAL TUNNEL RELEASE;  Surgeon: Betha Loa, MD;  Location: [facility];  Service: Orthopedics;  Laterality: Left;   CERVICAL SPINE SURGERY Right 11/2012   ESOPHAGOGASTRODUODENOSCOPY (EGD) WITH PROPOFOL N/A 08/01/2022   Procedure: ESOPHAGOGASTRODUODENOSCOPY (EGD) WITH PROPOFOL;  Surgeon: Jeani Hawking, MD;  Location: [facility];  Service: Gastroenterology;  Laterality: N/A;   OOPHORECTOMY     TRIGGER FINGER RELEASE Right 08/06/2017   Procedure: RIGHT THUMB TRIGGER RELEASE;  Surgeon: Betha Loa, MD;  Location: [facility];  Service: Orthopedics;  Laterality: Right;   ULNAR COLLATERAL LIGAMENT REPAIR Right 03/21/2021   Procedure: RIGHT THUMB ULNAR COLLATERAL LIGAMENT REPAIR;  Surgeon: Betha Loa, MD;  Location:  SURGERY CENTER;  Service: Orthopedics;  Laterality: Right;   UPPER ESOPHAGEAL ENDOSCOPIC ULTRASOUND (EUS) N/A 08/01/2022   Procedure: UPPER ESOPHAGEAL ENDOSCOPIC ULTRASOUND (EUS);  Surgeon: Jeani Hawking, MD;  Location: [facility];  Service: Gastroenterology;  Laterality: N/A;   Family History  Problem Relation Age of Onset   Diabetes Mother    Cystic fibrosis Sister    Cancer Sister        ?   Breast cancer Maternal Aunt    Heart disease Maternal Grandmother    Clotting disorder Maternal Grandfather    Social History   Socioeconomic History   Marital status: Single    Spouse name: Not on file   Number of children: 1   Years of education: Not on file   Highest education level: Not on file  Occupational History   Occupation: DISABLED    Employer: DISABLED  Tobacco Use   Smoking status: Every Day    Current packs/day: 0.25    Average packs/day: 0.3 packs/day for 45.0 years (11.3 ttl pk-yrs)    Types: Cigarettes   Smokeless  tobacco: Never   Tobacco comments:    4 cig a day, not interested in chantix, 11/27/22 - she is smoking one cigarette a day  Vaping Use   Vaping status: Never Used  Substance and Sexual Activity   Alcohol use: Not Currently    Comment: occasional   Drug use: Yes    Types: Marijuana   Sexual activity: Yes    Partners: Male    Birth control/protection: None  Other Topics Concern   Not on file  Social History Narrative   Not on file   Social Determinants of Health   Financial Resource Strain: Low Risk  (06/17/2023)   Overall Financial Resource Strain (CARDIA)    Difficulty of Paying Living Expenses: Not hard at all  Food Insecurity: No Food Insecurity (06/17/2023)   Hunger Vital Sign    Worried About Running Out of Food in the Last Year:  Never true    Ran Out of Food in the Last Year: Never true  Transportation Needs: No Transportation Needs (06/17/2023)   PRAPARE - Administrator, Civil Service (Medical): No    Lack of Transportation (Non-Medical): No  Physical Activity: Inactive (06/17/2023)   Exercise Vital Sign    Days of Exercise per Week: 0 days    Minutes of Exercise per Session: 0 min  Stress: No Stress Concern Present (06/17/2023)   [employer] of Occupational Health - Occupational Stress Questionnaire    Feeling of Stress : Not at all  Social Connections: Socially Isolated (06/17/2023)   Social Connection and Isolation Panel [NHANES]    Frequency of Communication with Friends and Family: More than three times a week    Frequency of Social Gatherings with Friends and Family: More than three times a week    Attends Religious Services: Never    Database administrator or Organizations: No    Attends Engineer, structural: Never    Marital Status: Never married    Tobacco Counseling Ready to quit: Yes Counseling given: Not Answered Tobacco comments: 4 cig a day, not interested in chantix, 11/27/22 - she is smoking one cigarette a day   Clinical  Intake:  Pre-visit preparation completed: Yes  Pain : 0-10 Pain Score: 7  Pain Type: Chronic pain Pain Location: Hip Pain Orientation: Right Pain Descriptors / Indicators: Aching Pain Onset: More than a month ago Pain Frequency: Constant     Nutritional Risks: None Diabetes: Yes CBG done?: No Did pt. bring in CBG monitor from home?: No  How often do you need to have someone help you when you read instructions, pamphlets, or other written materials from your doctor or pharmacy?: 1 - Never  Interpreter Needed?: No  Information entered by :: NAllen LPN   Activities of Daily Living    06/17/2023    8:13 AM  In your present state of health, do you have any difficulty performing the following activities:  Hearing? 0  Vision? 0  Difficulty concentrating or making decisions? 0  Walking or climbing stairs? 1  Comment due to hip  Dressing or bathing? 0  Doing errands, shopping? 0  Preparing Food and eating ? N  Using the Toilet? N  In the past six months, have you accidently leaked urine? N  Do you have problems with loss of bowel control? N  Managing your Medications? N  Managing your Finances? N  Housekeeping or managing your Housekeeping? N    Patient Care Team: [name], FNP as PCP - General (General Practice) Christell Constant, MD as PCP - Cardiology (Cardiology)  Indicate any recent Medical Services you may have received from other than [facility] providers in the past year (date may be approximate).     Assessment:   This is a routine wellness examination for Unique.  Hearing/Vision screen Hearing Screening - Comments:: Denies hearing issues Vision Screening - Comments:: Regular eye exams, [facility]  Dietary issues and exercise activities discussed:     Goals Addressed             This Visit's Progress    Patient Stated       06/17/2023, eat more healthy       Depression Screen    06/17/2023    8:22 AM 05/21/2023    4:03 PM  05/08/2022    2:54 PM 05/08/2022    2:13 PM 04/24/2021    9:36 AM  03/14/2021    3:29 PM 10/25/2020    2:25 PM  PHQ 2/9 Scores  PHQ - 2 Score 0 0 0 0 1 0 6  PHQ- 9 Score 1 2    0 8    Fall Risk    06/17/2023    8:21 AM 05/21/2023    4:03 PM 05/08/2022    2:53 PM 05/08/2022    2:12 PM 04/24/2021    9:36 AM  Fall Risk   Falls in the past year? 0 0 1 1 0  Comment   tripped    Number falls in past yr: 0 0 0 0   Injury with Fall? 0 0 0 0   Risk for fall due to : Medication side effect;Impaired mobility;Impaired balance/gait No Fall Risks Medication side effect History of fall(s) Medication side effect  Follow up Falls prevention discussed;Falls evaluation completed Falls evaluation completed Falls evaluation completed;Education provided;Falls prevention discussed Falls evaluation completed Falls evaluation completed;Education provided;Falls prevention discussed    MEDICARE RISK AT HOME: Medicare Risk at Home Any stairs in or around the home?: Yes If so, are there any without handrails?: No Home free of loose throw rugs in walkways, pet beds, electrical cords, etc?: Yes Adequate lighting in your home to reduce risk of falls?: Yes Life alert?: No Use of a cane, walker or w/c?: Yes Grab bars in the bathroom?: No Shower chair or bench in shower?: No Elevated toilet seat or a handicapped toilet?: No  TIMED UP AND GO:  Was the test performed?  No    Cognitive Function:        06/17/2023    8:22 AM 05/08/2022    2:55 PM 04/24/2021    9:42 AM 01/25/2020    3:28 PM 10/25/2019    9:57 AM  6CIT Screen  What Year? 0 points 0 points 0 points 0 points 0 points  What month? 0 points 0 points 0 points 0 points 0 points  What time? 0 points 0 points 3 points 0 points 0 points  Count back from 20 0 points 0 points 0 points 0 points 0 points  Months in reverse 4 points 2 points 0 points 0 points 4 points  Repeat phrase 2 points 4 points 6 points 2 points 8 points  Total Score 6 points 6 points  9 points 2 points 12 points    Immunizations Immunization History  Administered Date(s) Administered   Influenza-Unspecified 09/10/2020   PFIZER(Purple Top)SARS-COV-2 Vaccination 01/12/2020, 02/07/2020, 09/10/2020   Tdap 05/27/2013, 03/21/2015, 05/10/2018, 11/27/2022   Zoster Recombinant(Shingrix) 05/08/2022    TDAP status: Up to date  Flu Vaccine status: Due, Education has been provided regarding the importance of this vaccine. Advised may receive this vaccine at local pharmacy or Health Dept. Aware to provide a copy of the vaccination record if obtained from local pharmacy or Health Dept. Verbalized acceptance and understanding.  Pneumococcal vaccine status: Up to date  Covid-19 vaccine status: Information provided on how to obtain vaccines.   Qualifies for Shingles Vaccine? Yes   Zostavax completed No   Shingrix Completed?: needs second dose  Screening Tests Health Maintenance  Topic Date Due   Colonoscopy  12/07/2021   COVID-19 Vaccine (4 - 2023-24 season) 06/27/2022   Zoster Vaccines- Shingrix (2 of 2) 07/03/2022   FOOT EXAM  12/03/2022   INFLUENZA VACCINE  05/28/2023   OPHTHALMOLOGY EXAM  07/31/2023   HEMOGLOBIN A1C  11/21/2023   Diabetic kidney evaluation - eGFR measurement  11/23/2023   Diabetic  kidney evaluation - Urine ACR  12/11/2023   Medicare Annual Wellness (AWV)  06/16/2024   MAMMOGRAM  07/09/2024   PAP SMEAR-Modifier  12/03/2024   DTaP/Tdap/Td (5 - Td or Tdap) 11/27/2032   Hepatitis C Screening  Completed   HIV Screening  Completed   HPV VACCINES  Aged Out    Health Maintenance  Health Maintenance Due  Topic Date Due   Colonoscopy  12/07/2021   COVID-19 Vaccine (4 - 2023-24 season) 06/27/2022   Zoster Vaccines- Shingrix (2 of 2) 07/03/2022   FOOT EXAM  12/03/2022   INFLUENZA VACCINE  05/28/2023    Colorectal cancer screening: declines at this time  Mammogram status: Completed 07/09/2022. Repeat every year  Bone Density status: n/a  Lung  Cancer Screening: (Low Dose CT Chest recommended if Age 88-80 years, 20 pack-year currently smoking OR have quit w/in 15years.) does not qualify.   Lung Cancer Screening Referral: no  Additional Screening:  Hepatitis C Screening: does qualify; Completed 12/03/2021  Vision Screening: Recommended annual ophthalmology exams for early detection of glaucoma and other disorders of the eye. Is the patient up to date with their annual eye exam?  Yes  Who is the provider or what is the name of the office in which the patient attends annual eye exams? [facility] If pt is not established with a provider, would they like to be referred to a provider to establish care? No .   Dental Screening: Recommended annual dental exams for proper oral hygiene  Diabetic Foot Exam: Diabetic Foot Exam: Overdue, Pt has been advised about the importance in completing this exam. Pt is scheduled for diabetic foot exam on next appointment.  Community Resource Referral / Chronic Care Management: CRR required this visit?  No   CCM required this visit?  No     Plan:     I have personally reviewed and noted the following in the patient's chart:   Medical and social history Use of alcohol, tobacco or illicit drugs  Current medications and supplements including opioid prescriptions. Patient is not currently taking opioid prescriptions. Functional ability and status Nutritional status Physical activity Advanced directives List of other physicians Hospitalizations, surgeries, and ER visits in previous 12 months Vitals Screenings to include cognitive, depression, and falls Referrals and appointments  In addition, I have reviewed and discussed with patient certain preventive protocols, quality metrics, and best practice recommendations. A written personalized care plan for preventive services as well as general preventive health recommendations were provided to patient.     Barb Merino, LPN   02/02/8118    After Visit Summary: (Pick Up) Due to this being a telephonic visit, with patients personalized plan was offered to patient and patient has requested to Pick up at office.  Nurse Notes: none

## 2023-07-01 DIAGNOSIS — S93601A Unspecified sprain of right foot, initial encounter: Secondary | ICD-10-CM | POA: Diagnosis not present

## 2023-07-01 DIAGNOSIS — R600 Localized edema: Secondary | ICD-10-CM | POA: Diagnosis not present

## 2023-07-13 NOTE — Progress Notes (Unsigned)
[name], CMA,acting as a Neurosurgeon for [name], FNP.,have documented all relevant documentation on the behalf of [name], FNP,as directed by  [name], FNP while in the presence of [name], FNP.  Subjective:  Patient ID: Margaret Webster , female    DOB: 1961-01-17 , 62 y.o.   MRN: [id number]  No chief complaint on file.   HPI  Patient presents today for a DM and BP follow up, patient reports compliance with medications. Patient denies any chest pain, SOB, or headaches. Patient has no concerns today.     Past Medical History:  Diagnosis Date  . Arthritis    bil shoulders  . Bronchitis   . Chronic back pain   . Chronic kidney disease   . Diabetes mellitus    stopped Mrtformin 1 mo ago, made her "sick"  . Fatty liver   . Hypertension   . Hypothyroidism   . Stroke (HCC)    either 2019/2020  . Thyroid disease   . Trigger thumb of right hand      Family History  Problem Relation Age of Onset  . Diabetes Mother   . Cystic fibrosis Sister   . Cancer Sister        ?  Marland Kitchen Breast cancer Maternal Aunt   . Heart disease Maternal Grandmother   . Clotting disorder Maternal Grandfather      Current Outpatient Medications:  .  acetaminophen (TYLENOL) 500 MG tablet, Take 1 tablet (500 mg total) by mouth every 6 (six) hours as needed., Disp: 30 tablet, Rfl: 0 .  amLODipine (NORVASC) 2.5 MG tablet, TAKE 1 TABLET BY MOUTH ONCE  DAILY, Disp: 100 tablet, Rfl: 2 .  aspirin EC 81 MG tablet, Take 81 mg by mouth in the morning., Disp: , Rfl:  .  Bempedoic Acid (NEXLETOL) 180 MG TABS, Take 1 tablet (180 mg total) by mouth at bedtime. (Patient not taking: Reported on 06/17/2023), Disp: 90 tablet, Rfl: 2 .  cetirizine (ZYRTEC ALLERGY) 10 MG tablet, Take 1 tablet (10 mg total) by mouth daily., Disp: 30 tablet, Rfl: 2 .  cyclobenzaprine (FLEXERIL) 10 MG tablet, Take 1 tablet (10 mg total) by mouth 3 (three) times daily as needed for muscle spasms., Disp: 30 tablet, Rfl: 0 .   diclofenac Sodium (VOLTAREN) 1 % GEL, Apply topically 4 (four) times daily as needed (pain.)., Disp: , Rfl:  .  DULoxetine (CYMBALTA) 30 MG capsule, Take 60 mg by mouth daily at 2 PM., Disp: , Rfl:  .  Evolocumab (REPATHA SURECLICK) 140 MG/ML SOAJ, Inject 140 mg into the skin every 14 (fourteen) days., Disp: 2 mL, Rfl: 5 .  ezetimibe (ZETIA) 10 MG tablet, Take 1 tablet (10 mg total) by mouth daily., Disp: 90 tablet, Rfl: 2 .  levothyroxine (SYNTHROID) 25 MCG tablet, Take 1 tablet (25 mcg total) by mouth daily before breakfast., Disp: 30 tablet, Rfl: 2 .  losartan (COZAAR) 25 MG tablet, Take 0.5 tablets (12.5 mg total) by mouth daily. (Patient taking differently: Take 25 mg by mouth in the morning.), Disp: 90 tablet, Rfl: 1 .  metFORMIN (GLUCOPHAGE-XR) 500 MG 24 hr tablet, TAKE 1 TABLET BY MOUTH ONCE  DAILY WITH BREAKFAST, Disp: 100 tablet, Rfl: 2 .  promethazine-dextromethorphan (PROMETHAZINE-DM) 6.25-15 MG/5ML syrup, Take 5 mLs by mouth 4 (four) times daily as needed for cough. (Patient not taking: Reported on 12/19/2022), Disp: 118 mL, Rfl: 0 .  Semaglutide-Weight Management (WEGOVY) 0.5 MG/0.5ML SOAJ, Inject 0.5 mg into the skin once  a week. (Patient not taking: Reported on 06/17/2023), Disp: 2 mL, Rfl: 0 .  triamcinolone cream (KENALOG) 0.5 %, Apply 1 Application topically 2 (two) times daily., Disp: 30 g, Rfl: 0 .  Vitamin D, Ergocalciferol, (DRISDOL) 1.25 MG (50000 UNIT) CAPS capsule, Take 1 capsule (50,000 Units total) by mouth every Monday., Disp: 12 capsule, Rfl: 1   Allergies  Allergen Reactions  . Sulfa Antibiotics Other (See Comments)    Hard to breathe  . Amoxil [Amoxicillin]   . Ampicillin Other (See Comments) and Hypertension    Bloody stools   . Crestor [Rosuvastatin] Other (See Comments)    Makes her mouth numb and causes side pain  . Ibuprofen Other (See Comments)    Stomach upset  . Polytrim [Polymyxin B-Trimethoprim] Itching and Swelling    redness  . Influenza Vaccines  Hives  . Lipitor [Atorvastatin Calcium] Other (See Comments)    Muscle ache   . Naproxen Nausea And Vomiting     Review of Systems   There were no vitals filed for this visit. There is no height or weight on file to calculate BMI.  Wt Readings from Last 3 Encounters:  05/21/23 214 lb (97.1 kg)  05/19/23 217 lb 9.6 oz (98.7 kg)  04/20/23 214 lb (97.1 kg)    The ASCVD Risk score (Arnett DK, et al., 2019) failed to calculate for the following reasons:   The patient has a prior MI or stroke diagnosis  Objective:  Physical Exam      Assessment And Plan:  Prediabetes  Mixed hyperlipidemia  Benign hypertension with CKD (chronic kidney disease) stage III (HCC)    No follow-ups on file.  Patient was given opportunity to ask questions. Patient verbalized understanding of the plan and was able to repeat key elements of the plan. All questions were answered to their satisfaction.    Jeanell Sparrow, FNP, have reviewed all documentation for this visit. The documentation on 07/13/23 for the exam, diagnosis, procedures, and orders are all accurate and complete.   IF YOU HAVE BEEN REFERRED TO A SPECIALIST, IT MAY TAKE 1-2 WEEKS TO SCHEDULE/PROCESS THE REFERRAL. IF YOU HAVE NOT HEARD FROM US/SPECIALIST IN TWO WEEKS, PLEASE GIVE [place] A CALL AT [phone].

## 2023-07-14 ENCOUNTER — Ambulatory Visit: Payer: Self-pay | Admitting: Nurse Practitioner

## 2023-07-14 DIAGNOSIS — E782 Mixed hyperlipidemia: Secondary | ICD-10-CM

## 2023-07-14 DIAGNOSIS — I129 Hypertensive chronic kidney disease with stage 1 through stage 4 chronic kidney disease, or unspecified chronic kidney disease: Secondary | ICD-10-CM

## 2023-07-14 DIAGNOSIS — R7303 Prediabetes: Secondary | ICD-10-CM

## 2023-07-20 NOTE — Progress Notes (Addendum)
[name], CMA,acting as a Neurosurgeon for Margaret Felts, FNP.,have documented all relevant documentation on the behalf of Margaret Felts, FNP,as directed by  Margaret Felts, FNP while in the presence of Margaret Felts, FNP.  Subjective:  Patient ID: Margaret Webster , female    DOB: 1960/11/24 , 62 y.o.   MRN: [id number]  Chief Complaint  Patient presents with   Hypertension    HPI  Patient presents today for BP, CHOL, and Pre-DM. Patient reports compliance with medication. Patient denies any chest pain, SOB, or headaches. Patient report she is having boils coming back on her back. Patient would like to try ozempic. She had an injection to her hip and has not been to the gym since.   BP Readings from Last 3 Encounters: 07/21/23 : 120/60 05/21/23 : 130/80 05/19/23 : 128/62       Past Medical History:  Diagnosis Date   Arthritis    bil shoulders   Bronchitis    Chronic back pain    Chronic kidney disease    Diabetes mellitus    stopped Mrtformin 1 mo ago, made her "sick"   Fatty liver    Hypertension    Hypothyroidism    Stroke [facility])    either 2019/2020   Thyroid disease    Trigger thumb of right hand      Family History  Problem Relation Age of Onset   Diabetes Mother    Cystic fibrosis Sister    Cancer Sister        ?   Breast cancer Maternal Aunt    Heart disease Maternal Grandmother    Clotting disorder Maternal Grandfather      Current Outpatient Medications:    acetaminophen (TYLENOL) 500 MG tablet, Take 1 tablet (500 mg total) by mouth every 6 (six) hours as needed., Disp: 30 tablet, Rfl: 0   amLODipine (NORVASC) 2.5 MG tablet, TAKE 1 TABLET BY MOUTH ONCE  DAILY, Disp: 100 tablet, Rfl: 2   aspirin EC 81 MG tablet, Take 81 mg by mouth in the morning., Disp: , Rfl:    cetirizine (ZYRTEC ALLERGY) 10 MG tablet, Take 1 tablet (10 mg total) by mouth daily., Disp: 30 tablet, Rfl: 2   cyclobenzaprine (FLEXERIL) 10 MG tablet, Take 1 tablet (10 mg total) by mouth 3  (three) times daily as needed for muscle spasms., Disp: 30 tablet, Rfl: 0   diclofenac Sodium (VOLTAREN) 1 % GEL, Apply topically 4 (four) times daily as needed (pain.)., Disp: , Rfl:    DULoxetine (CYMBALTA) 30 MG capsule, Take 60 mg by mouth daily at 2 PM., Disp: , Rfl:    Evolocumab (REPATHA SURECLICK) 140 MG/ML SOAJ, Inject 140 mg into the skin every 14 (fourteen) days., Disp: 2 mL, Rfl: 5   ezetimibe (ZETIA) 10 MG tablet, Take 1 tablet (10 mg total) by mouth daily., Disp: 90 tablet, Rfl: 2   losartan (COZAAR) 25 MG tablet, Take 0.5 tablets (12.5 mg total) by mouth daily. (Patient taking differently: Take 25 mg by mouth in the morning.), Disp: 90 tablet, Rfl: 1   metFORMIN (GLUCOPHAGE-XR) 500 MG 24 hr tablet, TAKE 1 TABLET BY MOUTH ONCE  DAILY WITH BREAKFAST, Disp: 100 tablet, Rfl: 2   Vitamin D, Ergocalciferol, (DRISDOL) 1.25 MG (50000 UNIT) CAPS capsule, Take 1 capsule (50,000 Units total) by mouth every Monday., Disp: 12 capsule, Rfl: 1   Bempedoic Acid (NEXLETOL) 180 MG TABS, Take 1 tablet (180 mg total) by mouth at bedtime. (Patient not taking: Reported  on 06/17/2023), Disp: 90 tablet, Rfl: 2   levothyroxine (SYNTHROID) 50 MCG tablet, Take 1 tablet (50 mcg total) by mouth daily before breakfast., Disp: 90 tablet, Rfl: 1   promethazine-dextromethorphan (PROMETHAZINE-DM) 6.25-15 MG/5ML syrup, Take 5 mLs by mouth 4 (four) times daily as needed for cough. (Patient not taking: Reported on 12/19/2022), Disp: 118 mL, Rfl: 0   Semaglutide,0.25 or 0.5MG /DOS, 2 MG/3ML SOPN, Inject 0.5 mg into the skin once a week., Disp: 9 mL, Rfl: 1   triamcinolone cream (KENALOG) 0.5 %, Apply 1 Application topically 2 (two) times daily., Disp: 60 g, Rfl: 2   Allergies  Allergen Reactions   Sulfa Antibiotics Other (See Comments)    Hard to breathe   Amoxil [Amoxicillin]    Ampicillin Other (See Comments) and Hypertension    Bloody stools    Crestor [Rosuvastatin] Other (See Comments)    Makes her mouth numb and  causes side pain   Ibuprofen Other (See Comments)    Stomach upset   Polytrim [Polymyxin B-Trimethoprim] Itching and Swelling    redness   Influenza Vaccines Hives   Lipitor [Atorvastatin Calcium] Other (See Comments)    Muscle ache    Naproxen Nausea And Vomiting     Review of Systems  Constitutional: Negative.   HENT: Negative.    Eyes: Negative.   Respiratory: Negative.    Cardiovascular: Negative.   Gastrointestinal: Negative.      Today's Vitals   07/21/23 1214  BP: 120/60  Pulse: (!) 55  Temp: 98.7 F (37.1 C)  TempSrc: Oral  Weight: 214 lb 12.8 oz (97.4 kg)  Height: 5\' 5"  (1.651 m)  PainSc: 6   PainLoc: Hip   Body mass index is 35.74 kg/m.  Wt Readings from Last 3 Encounters:  07/21/23 214 lb 12.8 oz (97.4 kg)  05/21/23 214 lb (97.1 kg)  05/19/23 217 lb 9.6 oz (98.7 kg)  3 having left  Objective:  Physical Exam Vitals reviewed.  Constitutional:      Appearance: Normal appearance.  HENT:     Head: Normocephalic and atraumatic.  Cardiovascular:     Rate and Rhythm: Normal rate and regular rhythm.     Pulses: Normal pulses.     Heart sounds: Normal heart sounds. No murmur heard. Pulmonary:     Effort: Pulmonary effort is normal. No respiratory distress.     Breath sounds: Normal breath sounds. No wheezing.     Comments: Wearing O2 via [place] Skin:    General: Skin is warm and dry.     Capillary Refill: Capillary refill takes less than 2 seconds.  Neurological:     General: No focal deficit present.     Mental Status: She is alert and oriented to person, place, and time.     Cranial Nerves: No cranial nerve deficit.     Motor: No weakness.  Psychiatric:        Mood and Affect: Mood normal.        Behavior: Behavior normal.        Thought Content: Thought content normal.        Judgment: Judgment normal.         Assessment And Plan:  Benign hypertension with CKD (chronic kidney disease) stage III (HCC) Assessment & Plan: Blood pressure is well  controlled, continue current medications  Orders: -     BMP8+eGFR  Type 2 diabetes mellitus with stage 3a chronic kidney disease, with long-term current use of insulin (HCC) Assessment & Plan: HgbA1c is  stable. Will check HgbA1c, continue metformin.   Orders: -     Hemoglobin A1c -     BMP8+eGFR -     Semaglutide(0.25 or 0.5MG /DOS); Inject 0.5 mg into the skin once a week.  Dispense: 9 mL; Refill: 1  Mixed hyperlipidemia Assessment & Plan: Continue Repatha and f/u with Cardiology, she is not currently taking the Nexlotol  Orders: -     Lipid panel  Fatigue, unspecified type Assessment & Plan: Will check for any metabolic causes.  Encouraged to continue wearing his CPAP machine.  Encouraged to also get his regular physical activity to help with deconditioning.  Orders: -     VITAMIN D 25 Hydroxy (Vit-D Deficiency, Fractures) -     Vitamin B12 -     Iron, TIBC and Ferritin Panel -     CBC with Differential/Platelet  Obesity (BMI 35.0-39.9 without comorbidity) Assessment & Plan: She is encouraged to strive for BMI less than 30 to decrease cardiac risk. Advised to aim for at least 150 minutes of exercise per week.    PAD (peripheral artery disease) (HCC) Assessment & Plan: Unable to tolerate statin, she is on Repatha and has an order for Nexlotol but unsure.   Orders: -     Semaglutide(0.25 or 0.5MG /DOS); Inject 0.5 mg into the skin once a week.  Dispense: 9 mL; Refill: 1  Vitamin D deficiency Assessment & Plan: Will check vitamin D level and supplement as needed.    Also encouraged to spend 15 minutes in the sun daily.     Rash and nonspecific skin eruption Assessment & Plan: Will send Rx for valtrex due to having rash to buttocks area.  Orders: -     valACYclovir HCl; Take 1 tablet (500 mg total) by mouth 2 (two) times daily for 5 days.  Dispense: 10 tablet; Refill: 0 -     Triamcinolone Acetonide; Apply 1 Application topically 2 (two) times daily.  Dispense: 60  g; Refill: 2  Acquired hypothyroidism Assessment & Plan: Will check thyroid levels, no current medications  Orders: -     TSH + free T4  History of CVA (cerebrovascular accident)  Sleep apnea with use of continuous positive airway pressure (CPAP) Assessment & Plan: Encouraged to continue wearing CPAP   Statin myopathy Assessment & Plan: Continue Repatha     Return for 6 month bp check.  Patient was given opportunity to ask questions. Patient verbalized understanding of the plan and was able to repeat key elements of the plan. All questions were answered to their satisfaction.    Jeanell Sparrow, FNP, have reviewed all documentation for this visit. The documentation on 07/21/23 for the exam, diagnosis, procedures, and orders are all accurate and complete.   IF YOU HAVE BEEN REFERRED TO A SPECIALIST, IT MAY TAKE 1-2 WEEKS TO SCHEDULE/PROCESS THE REFERRAL. IF YOU HAVE NOT HEARD FROM US/SPECIALIST IN TWO WEEKS, PLEASE GIVE [place] A CALL AT [phone].

## 2023-07-21 ENCOUNTER — Encounter: Payer: Self-pay | Admitting: Nurse Practitioner

## 2023-07-21 ENCOUNTER — Ambulatory Visit: Payer: 59 | Admitting: Nurse Practitioner

## 2023-07-21 VITALS — BP 120/60 | HR 55 | Temp 98.7°F | Ht 65.0 in | Wt 214.8 lb

## 2023-07-21 DIAGNOSIS — E782 Mixed hyperlipidemia: Secondary | ICD-10-CM | POA: Diagnosis not present

## 2023-07-21 DIAGNOSIS — R5383 Other fatigue: Secondary | ICD-10-CM

## 2023-07-21 DIAGNOSIS — N1831 Chronic kidney disease, stage 3a: Secondary | ICD-10-CM

## 2023-07-21 DIAGNOSIS — I739 Peripheral vascular disease, unspecified: Secondary | ICD-10-CM

## 2023-07-21 DIAGNOSIS — E1151 Type 2 diabetes mellitus with diabetic peripheral angiopathy without gangrene: Secondary | ICD-10-CM | POA: Diagnosis not present

## 2023-07-21 DIAGNOSIS — I129 Hypertensive chronic kidney disease with stage 1 through stage 4 chronic kidney disease, or unspecified chronic kidney disease: Secondary | ICD-10-CM | POA: Diagnosis not present

## 2023-07-21 DIAGNOSIS — Z9989 Dependence on other enabling machines and devices: Secondary | ICD-10-CM

## 2023-07-21 DIAGNOSIS — N183 Chronic kidney disease, stage 3 unspecified: Secondary | ICD-10-CM | POA: Diagnosis not present

## 2023-07-21 DIAGNOSIS — E039 Hypothyroidism, unspecified: Secondary | ICD-10-CM

## 2023-07-21 DIAGNOSIS — R21 Rash and other nonspecific skin eruption: Secondary | ICD-10-CM

## 2023-07-21 DIAGNOSIS — Z794 Long term (current) use of insulin: Secondary | ICD-10-CM

## 2023-07-21 DIAGNOSIS — G4730 Sleep apnea, unspecified: Secondary | ICD-10-CM

## 2023-07-21 DIAGNOSIS — T466X5A Adverse effect of antihyperlipidemic and antiarteriosclerotic drugs, initial encounter: Secondary | ICD-10-CM

## 2023-07-21 DIAGNOSIS — G720 Drug-induced myopathy: Secondary | ICD-10-CM | POA: Diagnosis not present

## 2023-07-21 DIAGNOSIS — E1122 Type 2 diabetes mellitus with diabetic chronic kidney disease: Secondary | ICD-10-CM

## 2023-07-21 DIAGNOSIS — Z8673 Personal history of transient ischemic attack (TIA), and cerebral infarction without residual deficits: Secondary | ICD-10-CM

## 2023-07-21 DIAGNOSIS — E669 Obesity, unspecified: Secondary | ICD-10-CM

## 2023-07-21 DIAGNOSIS — E559 Vitamin D deficiency, unspecified: Secondary | ICD-10-CM

## 2023-07-21 DIAGNOSIS — Z23 Encounter for immunization: Secondary | ICD-10-CM

## 2023-07-21 DIAGNOSIS — R7303 Prediabetes: Secondary | ICD-10-CM

## 2023-07-21 MED ORDER — SEMAGLUTIDE(0.25 OR 0.5MG/DOS) 2 MG/3ML ~~LOC~~ SOPN: 0.5000 mg | PEN_INJECTOR | SUBCUTANEOUS | 1 refills | Status: DC

## 2023-07-21 MED ORDER — WEGOVY 0.5 MG/0.5ML ~~LOC~~ SOAJ
0.5000 mg | SUBCUTANEOUS | 0 refills | Status: DC
Start: 2023-07-21 — End: 2023-07-21

## 2023-07-21 MED ORDER — SEMAGLUTIDE(0.25 OR 0.5MG/DOS) 2 MG/3ML ~~LOC~~ SOPN
0.5000 mg | PEN_INJECTOR | SUBCUTANEOUS | 1 refills | Status: DC
Start: 2023-07-21 — End: 2024-03-15

## 2023-07-21 MED ORDER — VALACYCLOVIR HCL 500 MG PO TABS
500.0000 mg | ORAL_TABLET | Freq: Two times a day (BID) | ORAL | 0 refills | Status: AC
Start: 2023-07-21 — End: 2023-07-26

## 2023-07-21 MED ORDER — TRIAMCINOLONE ACETONIDE 0.5 % EX CREA
1.0000 | TOPICAL_CREAM | Freq: Two times a day (BID) | CUTANEOUS | 2 refills | Status: DC
Start: 2023-07-21 — End: 2024-04-20

## 2023-07-22 ENCOUNTER — Other Ambulatory Visit: Payer: Self-pay | Admitting: Nurse Practitioner

## 2023-07-22 DIAGNOSIS — E039 Hypothyroidism, unspecified: Secondary | ICD-10-CM

## 2023-07-22 LAB — IRON,TIBC AND FERRITIN PANEL
Ferritin: 62 ng/mL (ref 15–150)
Iron Saturation: 37 % (ref 15–55)
Iron: 109 ug/dL (ref 27–139)
Total Iron Binding Capacity: 296 ug/dL (ref 250–450)
UIBC: 187 ug/dL (ref 118–369)

## 2023-07-22 LAB — LIPID PANEL
Chol/HDL Ratio: 3.8 ratio (ref 0.0–4.4)
Cholesterol, Total: 179 mg/dL (ref 100–199)
HDL: 47 mg/dL (ref >39)
LDL Chol Calc (NIH): 105 mg/dL — ABNORMAL HIGH (ref 0–99)
Triglycerides: 155 mg/dL — ABNORMAL HIGH (ref 0–149)
VLDL Cholesterol Cal: 27 mg/dL (ref 5–40)

## 2023-07-22 LAB — CBC WITH DIFFERENTIAL/PLATELET
Basophils Absolute: 0 10*3/uL (ref 0.0–0.2)
Basos: 1 %
EOS (ABSOLUTE): 0.1 10*3/uL (ref 0.0–0.4)
Eos: 2 %
Hematocrit: 45.4 % (ref 34.0–46.6)
Hemoglobin: 15.2 g/dL (ref 11.1–15.9)
Immature Grans (Abs): 0 10*3/uL (ref 0.0–0.1)
Immature Granulocytes: 0 %
Lymphocytes Absolute: 1.7 10*3/uL (ref 0.7–3.1)
Lymphs: 47 %
MCH: 31 pg (ref 26.6–33.0)
MCHC: 33.5 g/dL (ref 31.5–35.7)
MCV: 93 fL (ref 79–97)
Monocytes Absolute: 0.6 10*3/uL (ref 0.1–0.9)
Monocytes: 15 %
Neutrophils Absolute: 1.3 10*3/uL — ABNORMAL LOW (ref 1.4–7.0)
Neutrophils: 35 %
Platelets: 248 10*3/uL (ref 150–450)
RBC: 4.9 x10E6/uL (ref 3.77–5.28)
RDW: 12.8 % (ref 11.7–15.4)
WBC: 3.7 10*3/uL (ref 3.4–10.8)

## 2023-07-22 LAB — TSH+FREE T4
Free T4: 0.89 ng/dL (ref 0.82–1.77)
TSH: 8.32 u[IU]/mL — ABNORMAL HIGH (ref 0.450–4.500)

## 2023-07-22 LAB — HEMOGLOBIN A1C
Est. average glucose Bld gHb Est-mCnc: 134 mg/dL
Hgb A1c MFr Bld: 6.3 % — ABNORMAL HIGH (ref 4.8–5.6)

## 2023-07-22 LAB — VITAMIN D 25 HYDROXY (VIT D DEFICIENCY, FRACTURES): Vit D, 25-Hydroxy: 40 ng/mL (ref 30.0–100.0)

## 2023-07-22 LAB — BMP8+EGFR
BUN/Creatinine Ratio: 14 (ref 12–28)
BUN: 15 mg/dL (ref 8–27)
CO2: 23 mmol/L (ref 20–29)
Calcium: 9.8 mg/dL (ref 8.7–10.3)
Chloride: 103 mmol/L (ref 96–106)
Creatinine, Ser: 1.11 mg/dL — ABNORMAL HIGH (ref 0.57–1.00)
Glucose: 83 mg/dL (ref 70–99)
Potassium: 5.4 mmol/L — ABNORMAL HIGH (ref 3.5–5.2)
Sodium: 140 mmol/L (ref 134–144)
eGFR: 56 mL/min/{1.73_m2} — ABNORMAL LOW (ref >59)

## 2023-07-22 LAB — VITAMIN B12: Vitamin B-12: 921 pg/mL (ref 232–1245)

## 2023-07-22 MED ORDER — LEVOTHYROXINE SODIUM 50 MCG PO TABS
50.0000 ug | ORAL_TABLET | Freq: Every day | ORAL | 1 refills | Status: DC
Start: 2023-07-22 — End: 2023-09-15

## 2023-07-30 DIAGNOSIS — Z23 Encounter for immunization: Secondary | ICD-10-CM | POA: Insufficient documentation

## 2023-07-30 DIAGNOSIS — R5383 Other fatigue: Secondary | ICD-10-CM | POA: Insufficient documentation

## 2023-07-30 NOTE — Assessment & Plan Note (Addendum)
HgbA1c is stable. Will check HgbA1c, continue metformin.

## 2023-07-30 NOTE — Assessment & Plan Note (Signed)
Continue Repatha and f/u with Cardiology, she is not currently taking the Nexlotol

## 2023-07-30 NOTE — Assessment & Plan Note (Signed)
Will check vitamin D level and supplement as needed.    Also encouraged to spend 15 minutes in the sun daily.   

## 2023-07-30 NOTE — Assessment & Plan Note (Signed)
Will send Rx for valtrex due to having rash to buttocks area.

## 2023-07-30 NOTE — Assessment & Plan Note (Addendum)
Transrx done for shingrix however due to having an active rash did not receive.

## 2023-07-30 NOTE — Assessment & Plan Note (Signed)
Will check thyroid levels, no current medications

## 2023-07-30 NOTE — Assessment & Plan Note (Signed)
She is encouraged to strive for BMI less than 30 to decrease cardiac risk. Advised to aim for at least 150 minutes of exercise per week.  

## 2023-07-30 NOTE — Assessment & Plan Note (Signed)
Unable to tolerate statin, she is on Repatha and has an order for Nexlotol but unsure.

## 2023-07-30 NOTE — Assessment & Plan Note (Addendum)
Will check for any metabolic causes.  Encouraged to continue wearing his CPAP machine.  Encouraged to also get his regular physical activity to help with deconditioning.

## 2023-07-30 NOTE — Assessment & Plan Note (Signed)
Blood pressure is well controlled, continue current medications.

## 2023-07-31 ENCOUNTER — Other Ambulatory Visit: Payer: Self-pay | Admitting: Nurse Practitioner

## 2023-07-31 DIAGNOSIS — Z1212 Encounter for screening for malignant neoplasm of rectum: Secondary | ICD-10-CM

## 2023-07-31 DIAGNOSIS — Z1211 Encounter for screening for malignant neoplasm of colon: Secondary | ICD-10-CM

## 2023-08-04 DIAGNOSIS — H04123 Dry eye syndrome of bilateral lacrimal glands: Secondary | ICD-10-CM | POA: Diagnosis not present

## 2023-08-04 DIAGNOSIS — G4730 Sleep apnea, unspecified: Secondary | ICD-10-CM | POA: Diagnosis not present

## 2023-08-04 DIAGNOSIS — H2513 Age-related nuclear cataract, bilateral: Secondary | ICD-10-CM | POA: Diagnosis not present

## 2023-08-04 DIAGNOSIS — I639 Cerebral infarction, unspecified: Secondary | ICD-10-CM | POA: Diagnosis not present

## 2023-08-04 DIAGNOSIS — H5213 Myopia, bilateral: Secondary | ICD-10-CM | POA: Diagnosis not present

## 2023-08-04 DIAGNOSIS — H1045 Other chronic allergic conjunctivitis: Secondary | ICD-10-CM | POA: Diagnosis not present

## 2023-08-04 DIAGNOSIS — E119 Type 2 diabetes mellitus without complications: Secondary | ICD-10-CM | POA: Diagnosis not present

## 2023-08-04 LAB — HM DIABETES EYE EXAM

## 2023-08-20 DIAGNOSIS — G720 Drug-induced myopathy: Secondary | ICD-10-CM | POA: Insufficient documentation

## 2023-08-20 DIAGNOSIS — G4730 Sleep apnea, unspecified: Secondary | ICD-10-CM | POA: Insufficient documentation

## 2023-08-20 NOTE — Assessment & Plan Note (Signed)
Continue Repatha.

## 2023-08-20 NOTE — Assessment & Plan Note (Signed)
Encouraged to continue wearing CPAP

## 2023-09-02 DIAGNOSIS — H52223 Regular astigmatism, bilateral: Secondary | ICD-10-CM | POA: Diagnosis not present

## 2023-09-10 ENCOUNTER — Other Ambulatory Visit: Payer: 59

## 2023-09-10 DIAGNOSIS — E039 Hypothyroidism, unspecified: Secondary | ICD-10-CM

## 2023-09-11 LAB — TSH+FREE T4
Free T4: 0.95 ng/dL (ref 0.82–1.77)
TSH: 7.71 u[IU]/mL — ABNORMAL HIGH (ref 0.450–4.500)

## 2023-09-15 ENCOUNTER — Other Ambulatory Visit: Payer: Self-pay | Admitting: Nurse Practitioner

## 2023-09-15 DIAGNOSIS — E039 Hypothyroidism, unspecified: Secondary | ICD-10-CM

## 2023-09-15 MED ORDER — LEVOTHYROXINE SODIUM 75 MCG PO TABS
75.0000 ug | ORAL_TABLET | Freq: Every day | ORAL | 1 refills | Status: DC
Start: 2023-09-15 — End: 2024-03-15

## 2023-09-17 DIAGNOSIS — Z720 Tobacco use: Secondary | ICD-10-CM | POA: Diagnosis not present

## 2023-09-17 DIAGNOSIS — I129 Hypertensive chronic kidney disease with stage 1 through stage 4 chronic kidney disease, or unspecified chronic kidney disease: Secondary | ICD-10-CM | POA: Diagnosis not present

## 2023-09-17 DIAGNOSIS — E1122 Type 2 diabetes mellitus with diabetic chronic kidney disease: Secondary | ICD-10-CM | POA: Diagnosis not present

## 2023-09-17 DIAGNOSIS — E875 Hyperkalemia: Secondary | ICD-10-CM | POA: Diagnosis not present

## 2023-09-17 DIAGNOSIS — M549 Dorsalgia, unspecified: Secondary | ICD-10-CM | POA: Diagnosis not present

## 2023-09-17 DIAGNOSIS — N1831 Chronic kidney disease, stage 3a: Secondary | ICD-10-CM | POA: Diagnosis not present

## 2023-09-17 DIAGNOSIS — G8929 Other chronic pain: Secondary | ICD-10-CM | POA: Diagnosis not present

## 2023-09-18 LAB — MICROALBUMIN / CREATININE URINE RATIO
Albumin, Urine POC: 62.3
Creatinine, POC: 19.2 mg/dL
Microalb Creat Ratio: 324

## 2023-09-21 ENCOUNTER — Other Ambulatory Visit: Payer: Self-pay

## 2023-09-21 DIAGNOSIS — E039 Hypothyroidism, unspecified: Secondary | ICD-10-CM

## 2023-10-01 ENCOUNTER — Ambulatory Visit: Payer: 59 | Attending: Internal Medicine | Admitting: Internal Medicine

## 2023-10-04 ENCOUNTER — Encounter (HOSPITAL_COMMUNITY): Payer: Self-pay | Admitting: *Deleted

## 2023-10-04 ENCOUNTER — Emergency Department (HOSPITAL_COMMUNITY): Payer: 59

## 2023-10-04 ENCOUNTER — Other Ambulatory Visit: Payer: Self-pay

## 2023-10-04 ENCOUNTER — Emergency Department (HOSPITAL_COMMUNITY)
Admission: EM | Admit: 2023-10-04 | Discharge: 2023-10-04 | Disposition: A | Discharge status: Home / Self Care | Payer: 59 | Attending: Emergency Medicine | Admitting: Emergency Medicine

## 2023-10-04 DIAGNOSIS — I129 Hypertensive chronic kidney disease with stage 1 through stage 4 chronic kidney disease, or unspecified chronic kidney disease: Secondary | ICD-10-CM | POA: Diagnosis not present

## 2023-10-04 DIAGNOSIS — R0789 Other chest pain: Secondary | ICD-10-CM | POA: Diagnosis not present

## 2023-10-04 DIAGNOSIS — N189 Chronic kidney disease, unspecified: Secondary | ICD-10-CM | POA: Insufficient documentation

## 2023-10-04 DIAGNOSIS — E1122 Type 2 diabetes mellitus with diabetic chronic kidney disease: Secondary | ICD-10-CM | POA: Insufficient documentation

## 2023-10-04 DIAGNOSIS — Z79899 Other long term (current) drug therapy: Secondary | ICD-10-CM | POA: Insufficient documentation

## 2023-10-04 DIAGNOSIS — Z7982 Long term (current) use of aspirin: Secondary | ICD-10-CM | POA: Insufficient documentation

## 2023-10-04 DIAGNOSIS — R079 Chest pain, unspecified: Secondary | ICD-10-CM | POA: Diagnosis not present

## 2023-10-04 LAB — BASIC METABOLIC PANEL
Anion gap: 9 (ref 5–15)
BUN: 15 mg/dL (ref 8–23)
CO2: 23 mmol/L (ref 22–32)
Calcium: 9.1 mg/dL (ref 8.9–10.3)
Chloride: 106 mmol/L (ref 98–111)
Creatinine, Ser: 1.15 mg/dL — ABNORMAL HIGH (ref 0.44–1.00)
GFR, Estimated: 53 mL/min — ABNORMAL LOW (ref >60)
Glucose, Bld: 100 mg/dL — ABNORMAL HIGH (ref 70–99)
Potassium: 4 mmol/L (ref 3.5–5.1)
Sodium: 138 mmol/L (ref 135–145)

## 2023-10-04 LAB — TROPONIN I (HIGH SENSITIVITY)
Troponin I (High Sensitivity): 5 ng/L (ref <18)
Troponin I (High Sensitivity): 6 ng/L (ref <18)

## 2023-10-04 LAB — CBC
HCT: 43.8 % (ref 36.0–46.0)
Hemoglobin: 15 g/dL (ref 12.0–15.0)
MCH: 31.3 pg (ref 26.0–34.0)
MCHC: 34.2 g/dL (ref 30.0–36.0)
MCV: 91.4 fL (ref 80.0–100.0)
Platelets: 277 10*3/uL (ref 150–400)
RBC: 4.79 MIL/uL (ref 3.87–5.11)
RDW: 12.8 % (ref 11.5–15.5)
WBC: 5 10*3/uL (ref 4.0–10.5)
nRBC: 0 % (ref 0.0–0.2)

## 2023-10-04 MED ORDER — CYCLOBENZAPRINE HCL 10 MG PO TABS: 10.0000 mg | ORAL_TABLET | Freq: Two times a day (BID) | ORAL | 0 refills | Status: DC | PRN

## 2023-10-04 MED ORDER — LIDOCAINE 5 % EX PTCH: 1.0000 | MEDICATED_PATCH | CUTANEOUS | 0 refills | Status: DC

## 2023-10-04 NOTE — ED Provider Notes (Signed)
[place] EMERGENCY DEPARTMENT AT [facility] Provider Note   CSN: [id number] Arrival date & time: 10/04/23  0607     History {Add pertinent medical, surgical, social history, OB history to HPI:1} No chief complaint on file.   Margaret Webster is a 62 y.o. female.  HPI     63yo female with history of hypertension, Dm or pre-DM, CKD, CVA,   Friday was cold, was cleaning at the church and when got home shoulder started hurting when left arm up abduction it hurts in the chest like a pulling pain then started getting heart burn and got nervous.  Pain sore spot, tender spots over chest and it pulls when lifting left arm up.  Burning heart burn started this AM, take thyroid pills and wine With coughing it hurts too.  Was spitting up phlegm like cloudy, not green.  No vomiting, did have nausea this AM.  No shortnss of breath, no leg pain or swelling.  No fever. No specific injury to shoulder but has been using it when clearning, can hear shoulder clicking when moving it. No falls or trauma. No neck pain, numbness nor weakness.  No known hx of heart disease, grandma had heart attack, mom mini strokes  Cutting back on cigarettes, occ thc, little bit of wine occ  Past Medical History:  Diagnosis Date   Arthritis    bil shoulders   Bronchitis    Chronic back pain    Chronic kidney disease    Diabetes mellitus    stopped Mrtformin 1 mo ago, made her "sick"   Fatty liver    Hypertension    Hypothyroidism    Stroke [facility])    either 2019/2020   Thyroid disease    Trigger thumb of right hand      Home Medications Prior to Admission medications   Medication Sig Start Date End Date Taking? Authorizing Provider  acetaminophen (TYLENOL) 500 MG tablet Take 1 tablet (500 mg total) by mouth every 6 (six) hours as needed. 11/08/22   Mardella Layman, MD  amLODipine (NORVASC) 2.5 MG tablet TAKE 1 TABLET BY MOUTH ONCE  DAILY 06/15/23   [name], FNP  aspirin EC 81 MG tablet Take 81  mg by mouth in the morning.    [provider]  Bempedoic Acid (NEXLETOL) 180 MG TABS Take 1 tablet (180 mg total) by mouth at bedtime. Patient not taking: Reported on 06/17/2023 04/17/23   Chrystie Nose, MD  cetirizine (ZYRTEC ALLERGY) 10 MG tablet Take 1 tablet (10 mg total) by mouth daily. 05/21/23 05/20/24  [name], FNP  cyclobenzaprine (FLEXERIL) 10 MG tablet Take 1 tablet (10 mg total) by mouth 3 (three) times daily as needed for muscle spasms. 05/29/22   [name], FNP  diclofenac Sodium (VOLTAREN) 1 % GEL Apply topically 4 (four) times daily as needed (pain.).    [provider]  DULoxetine (CYMBALTA) 30 MG capsule Take 60 mg by mouth daily at 2 PM. 07/06/22   [provider]  Evolocumab (REPATHA SURECLICK) 140 MG/ML SOAJ Inject 140 mg into the skin every 14 (fourteen) days. 04/17/23   Hilty, Lisette Abu, MD  ezetimibe (ZETIA) 10 MG tablet Take 1 tablet (10 mg total) by mouth daily. 04/17/23   Hilty, Lisette Abu, MD  levothyroxine (SYNTHROID) 75 MCG tablet Take 1 tablet (75 mcg total) by mouth daily before breakfast. 09/15/23   [name], FNP  losartan (COZAAR) 25 MG tablet Take 0.5 tablets (12.5 mg total) by  mouth daily. Patient taking differently: Take 25 mg by mouth in the morning. 07/28/22   [name], FNP  metFORMIN (GLUCOPHAGE-XR) 500 MG 24 hr tablet TAKE 1 TABLET BY MOUTH ONCE  DAILY WITH BREAKFAST 06/15/23   [name], FNP  promethazine-dextromethorphan (PROMETHAZINE-DM) 6.25-15 MG/5ML syrup Take 5 mLs by mouth 4 (four) times daily as needed for cough. Patient not taking: Reported on 12/19/2022 11/08/22   Mardella Layman, MD  Semaglutide,0.25 or 0.5MG /DOS, 2 MG/3ML SOPN Inject 0.5 mg into the skin once a week. 07/21/23   [name], FNP  triamcinolone cream (KENALOG) 0.5 % Apply 1 Application topically 2 (two) times daily. 07/21/23   [name], FNP  Vitamin D, Ergocalciferol, (DRISDOL) 1.25 MG (50000 UNIT) CAPS capsule Take 1 capsule (50,000  Units total) by mouth every Monday. 06/01/23   [name], FNP      Allergies    Sulfa antibiotics, Amoxil [amoxicillin], Ampicillin, Crestor [rosuvastatin], Ibuprofen, Polytrim [polymyxin b-trimethoprim], Influenza vaccines, Lipitor [atorvastatin calcium], and Naproxen    Review of Systems   Review of Systems  Physical Exam Updated Vital Signs BP (!) 148/70 (BP Location: Right Arm)   Pulse 68   Temp 97.8 F (36.6 C) (Oral)   Resp (!) 21   Ht 5\' 7"  (1.702 m)   Wt 98 kg   SpO2 100%   BMI 33.83 kg/m  Physical Exam  ED Results / Procedures / Treatments   Labs (all labs ordered are listed, but only abnormal results are displayed) Labs Reviewed  BASIC METABOLIC PANEL - Abnormal; Notable for the following components:      Result Value   Glucose, Bld 100 (*)    Creatinine, Ser 1.15 (*)    GFR, Estimated 53 (*)    All other components within normal limits  CBC  TROPONIN I (HIGH SENSITIVITY)  TROPONIN I (HIGH SENSITIVITY)    EKG None  Radiology DG Chest 2 View  Result Date: 10/04/2023 CLINICAL DATA:  Chest pain EXAM: CHEST - 2 VIEW COMPARISON:  None Available. FINDINGS: The heart size and mediastinal contours are within normal limits. Both lungs are clear. The visualized skeletal structures are unremarkable. IMPRESSION: No active cardiopulmonary disease. Electronically Signed   By: Kennith Center M.D.   On: 10/04/2023 07:56    Procedures Procedures  {Document cardiac monitor, telemetry assessment procedure when appropriate:1}  Medications Ordered in ED Medications - No data to display  ED Course/ Medical Decision Making/ A&P   {   Click here for ABCD2, HEART and other calculatorsREFRESH Note before signing :1}                              Medical Decision Making Amount and/or Complexity of Data Reviewed Labs: ordered. Radiology: ordered.   ***  {Document critical care time when appropriate:1} {Document review of labs and clinical decision tools ie heart  score, Chads2Vasc2 etc:1}  {Document your independent review of radiology images, and any outside records:1} {Document your discussion with family members, caretakers, and with consultants:1} {Document social determinants of health affecting pt's care:1} {Document your decision making why or why not admission, treatments were needed:1} Final Clinical Impression(s) / ED Diagnoses Final diagnoses:  None    Rx / DC Orders ED Discharge Orders     None

## 2023-10-04 NOTE — ED Notes (Signed)
Pt states she has heart burn as well.

## 2023-10-04 NOTE — ED Triage Notes (Signed)
Patient states  she started having left anterior chest pain worse with movement . States she can't raise her left arm.

## 2023-10-06 ENCOUNTER — Telehealth: Payer: Self-pay

## 2023-10-06 NOTE — Transitions of Care (Post Inpatient/ED Visit) (Signed)
   10/06/2023  Name: Margaret Webster MRN: [id number] DOB: 04-Dec-1960  Today's TOC FU Call Status: Today's TOC FU Call Status:: Unsuccessful Call (1st Attempt) Unsuccessful Call (1st Attempt) Date: 10/06/23  Attempted to reach the patient regarding the most recent Inpatient/ED visit.  Follow Up Plan: Additional outreach attempts will be made to reach the patient to complete the Transitions of Care (Post Inpatient/ED visit) call.   Signature Karena Addison, LPN [facility] Nurse Health Advisor Direct Dial [phone]

## 2023-10-12 NOTE — Transitions of Care (Post Inpatient/ED Visit) (Signed)
10/12/2023  Name: Margaret Webster MRN: [id number] DOB: September 16, 1961  Today's TOC FU Call Status: Today's TOC FU Call Status:: Successful TOC FU Call Completed Unsuccessful Call (1st Attempt) Date: 10/06/23 [facility] FU Call Complete Date: 10/12/23 Patient's Name and Date of Birth confirmed.  Transition Care Management Follow-up Telephone Call Date of Discharge: 10/03/23 Discharge Facility: [facility]) Type of Discharge: Emergency Department Reason for ED Visit: Other: (other chest pain) How have you been since you were released from the hospital?: Better Any questions or concerns?: No  Items Reviewed: Did you receive and understand the discharge instructions provided?: No Medications obtained,verified, and reconciled?: Yes (Medications Reviewed) Any new allergies since your discharge?: No Dietary orders reviewed?: Yes Do you have support at home?: Yes People in Home: grandchild(ren)  Medications Reviewed Today: Medications Reviewed Today     Reviewed by Karena Addison, LPN (Licensed Practical Nurse) on 10/12/23 at 1708  Med List Status: <None>   Medication Order Taking? Sig Documenting Provider Last Dose Status Informant  acetaminophen (TYLENOL) 500 MG tablet [id number] No Take 1 tablet (500 mg total) by mouth every 6 (six) hours as needed. Mardella Layman, MD Taking Active   amLODipine (NORVASC) 2.5 MG tablet [id number] No TAKE 1 TABLET BY MOUTH ONCE  DAILY [name], FNP Taking Active   aspirin EC 81 MG tablet [id number] No Take 81 mg by mouth in the morning. [provider] Taking Active Self  Bempedoic Acid (NEXLETOL) 180 MG TABS [id number] No Take 1 tablet (180 mg total) by mouth at bedtime.  Patient not taking: Reported on 06/17/2023   Chrystie Nose, MD Not Taking Active   cetirizine (ZYRTEC ALLERGY) 10 MG tablet [id number] No Take 1 tablet (10 mg total) by mouth daily. [name], FNP Taking Active   cyclobenzaprine (FLEXERIL) 10 MG tablet [id number]  Take 1  tablet (10 mg total) by mouth 2 (two) times daily as needed for muscle spasms. Alvira Monday, MD  Active   diclofenac Sodium (VOLTAREN) 1 % GEL [id number] No Apply topically 4 (four) times daily as needed (pain.). [provider] Taking Active Self  DULoxetine (CYMBALTA) 30 MG capsule [id number] No Take 60 mg by mouth daily at 2 PM. [provider] Taking Active Self  Evolocumab (REPATHA SURECLICK) 140 MG/ML SOAJ [id number] No Inject 140 mg into the skin every 14 (fourteen) days. Chrystie Nose, MD Taking Active   ezetimibe (ZETIA) 10 MG tablet [id number] No Take 1 tablet (10 mg total) by mouth daily. Chrystie Nose, MD Taking Active   levothyroxine (SYNTHROID) 75 MCG tablet [id number]  Take 1 tablet (75 mcg total) by mouth daily before breakfast. [name], FNP  Active   lidocaine (LIDODERM) 5 % [id number]  Place 1 patch onto the skin daily. Remove & Discard patch within 12 hours or as directed by MD Alvira Monday, MD  Active   losartan (COZAAR) 25 MG tablet [id number] No Take 0.5 tablets (12.5 mg total) by mouth daily.  Patient taking differently: Take 25 mg by mouth in the morning.   [name], FNP Taking Active Self  metFORMIN (GLUCOPHAGE-XR) 500 MG 24 hr tablet [id number] No TAKE 1 TABLET BY MOUTH ONCE  DAILY WITH Golden Hurter, FNP Taking Active   promethazine-dextromethorphan (PROMETHAZINE-DM) 6.25-15 MG/5ML syrup [id number] No Take 5 mLs by mouth 4 (four) times daily as needed for cough.  Patient not taking: Reported on 12/19/2022   Mardella Layman, MD Not Taking Active   Semaglutide,0.25 or 0.5MG /DOS, 2 MG/3ML Namon Cirri [id number]  Inject 0.5 mg into the skin once a week. [name], FNP  Active   triamcinolone cream (KENALOG) 0.5 % [id number]  Apply 1 Application topically 2 (two) times daily. [name], FNP  Active   Vitamin D, Ergocalciferol, (DRISDOL) 1.25 MG (50000 UNIT) CAPS capsule [id number] No Take 1 capsule (50,000 Units total) by mouth every  Monday. [name], FNP Taking Active             Home Care and Equipment/Supplies: Were Home Health Services Ordered?: NA Any new equipment or medical supplies ordered?: NA  Functional Questionnaire: Do you need assistance with bathing/showering or dressing?: No Do you need assistance with meal preparation?: No Do you need assistance with eating?: No Do you have difficulty maintaining continence: No Do you need assistance with getting out of bed/getting out of a chair/moving?: No Do you have difficulty managing or taking your medications?: No  Follow up appointments reviewed: PCP Follow-up appointment confirmed?: No (no avail appts, sent message to staff to schedule) MD Provider Line Number:[phone] Given: No Specialist Hospital Follow-up appointment confirmed?: NA Do you need transportation to your follow-up appointment?: No Do you understand care options if your condition(s) worsen?: Yes-patient verbalized understanding    SIGNATURE Karena Addison, LPN [facility] Nurse Health Advisor Direct Dial [phone]

## 2023-11-03 ENCOUNTER — Other Ambulatory Visit: Payer: Self-pay

## 2023-11-18 ENCOUNTER — Encounter (HOSPITAL_COMMUNITY): Payer: Self-pay

## 2023-11-18 ENCOUNTER — Ambulatory Visit (HOSPITAL_COMMUNITY)
Admission: EM | Admit: 2023-11-18 | Discharge: 2023-11-18 | Disposition: A | Discharge status: Home / Self Care | Payer: 59 | Attending: Emergency Medicine | Admitting: Emergency Medicine

## 2023-11-18 DIAGNOSIS — J069 Acute upper respiratory infection, unspecified: Secondary | ICD-10-CM | POA: Diagnosis not present

## 2023-11-18 DIAGNOSIS — R051 Acute cough: Secondary | ICD-10-CM

## 2023-11-18 MED ORDER — AZITHROMYCIN 250 MG PO TABS: ORAL_TABLET | ORAL | 0 refills | Status: DC

## 2023-11-18 MED ORDER — PREDNISONE 20 MG PO TABS: 40.0000 mg | ORAL_TABLET | Freq: Every day | ORAL | 0 refills | Status: AC

## 2023-11-18 MED ORDER — IPRATROPIUM-ALBUTEROL 0.5-2.5 (3) MG/3ML IN SOLN
RESPIRATORY_TRACT | Status: AC
Start: 2023-11-18 — End: ?
  Filled 2023-11-18: qty 3

## 2023-11-18 MED ORDER — BENZONATATE 100 MG PO CAPS: 100.0000 mg | ORAL_CAPSULE | Freq: Three times a day (TID) | ORAL | 0 refills | Status: DC

## 2023-11-18 MED ORDER — IPRATROPIUM-ALBUTEROL 0.5-2.5 (3) MG/3ML IN SOLN
3.0000 mL | Freq: Once | RESPIRATORY_TRACT | Status: AC
  Administered 2023-11-18: 3 mL via RESPIRATORY_TRACT

## 2023-11-18 NOTE — ED Triage Notes (Signed)
Pt c/o coughing, chills, and body aches since Monday. States coughing worse at night. States taken mucinex with no relief.

## 2023-11-18 NOTE — Discharge Instructions (Signed)
Start taking azithromycin and prednisone over the next 5 days.  Continue taking Mucinex for cough and congestion.  Alternate between Tylenol and ibuprofen as needed for body aches and fever.  If you develop severe shortness of breath, chest pain, fevers unrelieved by medication please seek medical treatment in the emergency department.  Follow-up with primary care or return here as needed.

## 2023-11-18 NOTE — ED Provider Notes (Signed)
[facility]    CSN: [id number] Arrival date & time: 11/18/23  0849      History   Chief Complaint Chief Complaint  Patient presents with   Cough    HPI Margaret Webster is a 63 y.o. female.   Patient presents with chills, body aches, cough, and congestion since Saturday.  Denies known fever, vomiting, diarrhea, abdominal pain, shortness of breath, and chest pain.  Patient reports taking Mucinex with minimal relief.  Patient denies asthma or COPD.  Patient states that she does smoke cigarettes daily.   Cough Associated symptoms: chills, rhinorrhea and sore throat   Associated symptoms: no chest pain, no fever, no shortness of breath and no wheezing     Past Medical History:  Diagnosis Date   Arthritis    bil shoulders   Bronchitis    Chronic back pain    Chronic kidney disease    Diabetes mellitus    stopped Mrtformin 1 mo ago, made her "sick"   Fatty liver    Hypertension    Hypothyroidism    Stroke (HCC)    either 2019/2020   Thyroid disease    Trigger thumb of right hand     Patient Active Problem List   Diagnosis Date Noted   Sleep apnea with use of continuous positive airway pressure (CPAP) 08/20/2023   Statin myopathy 08/20/2023   Need for zoster vaccination 07/30/2023   Fatigue 07/30/2023   Rash and nonspecific skin eruption 05/21/2023   Post-nasal drainage 05/21/2023   Obesity (BMI 35.0-39.9 without comorbidity) 05/21/2023   Herpes zoster vaccination declined 05/21/2023   Vitamin D deficiency 05/21/2023   Mixed hyperlipidemia 05/21/2023   Benign hypertension with CKD (chronic kidney disease) stage III (HCC) 05/21/2023   PAD (peripheral artery disease) (HCC) 11/27/2022   Depression 07/05/2020   Ringing in ears 07/05/2020   CVA (cerebral vascular accident) (HCC) 02/26/2020   CKD (chronic kidney disease) stage 3, GFR 30-59 ml/min (HCC)    Bradycardia    Elevated TSH    Multiple joint pain 11/07/2019   Acute pain of right shoulder  09/19/2019   Right hip pain 09/19/2019   Prediabetes 09/08/2018   Mass of joint of finger 07/26/2015   Chest pain 01/10/2015   Hypertension 01/10/2015   Hypothyroidism 01/10/2015   Headache 01/10/2015   Abdominal pain 01/10/2015   Vaginitis 04/07/2014   Pain 04/07/2014   Vaginal irritation 04/07/2014   Boil of buttock 04/06/2014   BV (bacterial vaginosis) 04/06/2014   Female genital symptoms 11/16/2013   Candidiasis of vulva and vagina 11/16/2013   Vaginitis and vulvovaginitis 06/07/2013   Pain aggravated by activities of daily living 06/07/2013   S/P cervical spinal fusion 01/17/2013   Cervical radiculopathy 12/13/2012   Carpal tunnel syndrome on both sides 12/06/2012   Cervical spondylosis with myelopathy 12/06/2012   Nonspecific abnormal finding in stool contents 12/02/2011   Diabetes mellitus without mention of complication 12/02/2011   Chronic back pain     Past Surgical History:  Procedure Laterality Date   BACK SURGERY  1993   CARPAL TUNNEL RELEASE  08/2015   CARPAL TUNNEL RELEASE Left 09/10/2017   Procedure: CARPAL TUNNEL RELEASE;  Surgeon: Betha Loa, MD;  Location: [facility];  Service: Orthopedics;  Laterality: Left;   CERVICAL SPINE SURGERY Right 11/2012   ESOPHAGOGASTRODUODENOSCOPY (EGD) WITH PROPOFOL N/A 08/01/2022   Procedure: ESOPHAGOGASTRODUODENOSCOPY (EGD) WITH PROPOFOL;  Surgeon: Jeani Hawking, MD;  Location: [facility];  Service: Gastroenterology;  Laterality: N/A;  OOPHORECTOMY     TRIGGER FINGER RELEASE Right 08/06/2017   Procedure: RIGHT THUMB TRIGGER RELEASE;  Surgeon: Betha Loa, MD;  Location: [facility];  Service: Orthopedics;  Laterality: Right;   ULNAR COLLATERAL LIGAMENT REPAIR Right 03/21/2021   Procedure: RIGHT THUMB ULNAR COLLATERAL LIGAMENT REPAIR;  Surgeon: Betha Loa, MD;  Location: [facility];  Service: Orthopedics;  Laterality: Right;   UPPER ESOPHAGEAL ENDOSCOPIC ULTRASOUND (EUS) N/A  08/01/2022   Procedure: UPPER ESOPHAGEAL ENDOSCOPIC ULTRASOUND (EUS);  Surgeon: Jeani Hawking, MD;  Location: [facility];  Service: Gastroenterology;  Laterality: N/A;    OB History     Gravida  1   Para  0   Term  0   Preterm  0   AB  0   Living  1      SAB  0   IAB  0   Ectopic  0   Multiple  0   Live Births  1            Home Medications    Prior to Admission medications   Medication Sig Start Date End Date Taking? Authorizing Provider  azithromycin (ZITHROMAX Z-PAK) 250 MG tablet Take 2 pills (500mg ) first day and one pill (250mg ) the remaining 4 days. 11/18/23  Yes Susann Givens, Wanell Lorenzi A, NP  predniSONE (DELTASONE) 20 MG tablet Take 2 tablets (40 mg total) by mouth daily for 5 days. 11/18/23 11/23/23 Yes Wynonia Lawman A, NP  acetaminophen (TYLENOL) 500 MG tablet Take 1 tablet (500 mg total) by mouth every 6 (six) hours as needed. 11/08/22   Mardella Layman, MD  amLODipine (NORVASC) 2.5 MG tablet TAKE 1 TABLET BY MOUTH ONCE  DAILY 06/15/23   [name], FNP  aspirin EC 81 MG tablet Take 81 mg by mouth in the morning.    [provider]  Bempedoic Acid (NEXLETOL) 180 MG TABS Take 1 tablet (180 mg total) by mouth at bedtime. Patient not taking: Reported on 06/17/2023 04/17/23   Chrystie Nose, MD  cetirizine (ZYRTEC ALLERGY) 10 MG tablet Take 1 tablet (10 mg total) by mouth daily. 05/21/23 05/20/24  [name], FNP  cyclobenzaprine (FLEXERIL) 10 MG tablet Take 1 tablet (10 mg total) by mouth 2 (two) times daily as needed for muscle spasms. 10/04/23   Alvira Monday, MD  diclofenac Sodium (VOLTAREN) 1 % GEL Apply topically 4 (four) times daily as needed (pain.).    [provider]  DULoxetine (CYMBALTA) 30 MG capsule Take 60 mg by mouth daily at 2 PM. 07/06/22   [provider]  Evolocumab (REPATHA SURECLICK) 140 MG/ML SOAJ Inject 140 mg into the skin every 14 (fourteen) days. 04/17/23   Hilty, Lisette Abu, MD  ezetimibe (ZETIA) 10 MG tablet  Take 1 tablet (10 mg total) by mouth daily. 04/17/23   Hilty, Lisette Abu, MD  levothyroxine (SYNTHROID) 75 MCG tablet Take 1 tablet (75 mcg total) by mouth daily before breakfast. 09/15/23   [name], FNP  lidocaine (LIDODERM) 5 % Place 1 patch onto the skin daily. Remove & Discard patch within 12 hours or as directed by MD 10/04/23   Alvira Monday, MD  losartan (COZAAR) 25 MG tablet Take 0.5 tablets (12.5 mg total) by mouth daily. Patient taking differently: Take 25 mg by mouth in the morning. 07/28/22   [name], FNP  metFORMIN (GLUCOPHAGE-XR) 500 MG 24 hr tablet TAKE 1 TABLET BY MOUTH ONCE  DAILY WITH BREAKFAST 06/15/23   [name], FNP  promethazine-dextromethorphan (PROMETHAZINE-DM)  6.25-15 MG/5ML syrup Take 5 mLs by mouth 4 (four) times daily as needed for cough. Patient not taking: Reported on 12/19/2022 11/08/22   Mardella Layman, MD  Semaglutide,0.25 or 0.5MG /DOS, 2 MG/3ML SOPN Inject 0.5 mg into the skin once a week. 07/21/23   [name], FNP  triamcinolone cream (KENALOG) 0.5 % Apply 1 Application topically 2 (two) times daily. 07/21/23   [name], FNP  Vitamin D, Ergocalciferol, (DRISDOL) 1.25 MG (50000 UNIT) CAPS capsule Take 1 capsule (50,000 Units total) by mouth every Monday. 06/01/23   [name], FNP    Family History Family History  Problem Relation Age of Onset   Diabetes Mother    Cystic fibrosis Sister    Cancer Sister        ?   Breast cancer Maternal Aunt    Heart disease Maternal Grandmother    Clotting disorder Maternal Grandfather     Social History Social History   Tobacco Use   Smoking status: Every Day    Current packs/day: 0.25    Average packs/day: 0.3 packs/day for 45.0 years (11.3 ttl pk-yrs)    Types: Cigarettes   Smokeless tobacco: Never   Tobacco comments:    4 cig a day, not interested in chantix, 11/27/22 - she is smoking one cigarette a day  Vaping Use   Vaping status: Never Used  Substance Use Topics   Alcohol use: Not  Currently    Comment: occasional   Drug use: Yes    Types: Marijuana     Allergies   Sulfa antibiotics, Amoxil [amoxicillin], Ampicillin, Crestor [rosuvastatin], Ibuprofen, Polytrim [polymyxin b-trimethoprim], Influenza vaccines, Lipitor [atorvastatin calcium], and Naproxen   Review of Systems Review of Systems  Constitutional:  Positive for chills and fatigue. Negative for fever.  HENT:  Positive for congestion, rhinorrhea and sore throat.   Respiratory:  Positive for cough. Negative for chest tightness, shortness of breath and wheezing.   Cardiovascular:  Negative for chest pain.  Gastrointestinal:  Negative for abdominal pain, diarrhea, nausea and vomiting.     Physical Exam Triage Vital Signs ED Triage Vitals  Encounter Vitals Group     BP 11/18/23 0913 117/76     Systolic BP Percentile --      Diastolic BP Percentile --      Pulse Rate 11/18/23 0913 71     Resp 11/18/23 0913 18     Temp 11/18/23 0913 98.5 F (36.9 C)     Temp Source 11/18/23 0913 Oral     SpO2 11/18/23 0913 95 %     Weight --      Height --      Head Circumference --      Peak Flow --      Pain Score 11/18/23 0914 4     Pain Loc --      Pain Education --      Exclude from Growth Chart --    No data found.  Updated Vital Signs BP 117/76 (BP Location: Left Arm)   Pulse 71   Temp 98.5 F (36.9 C) (Oral)   Resp 18   SpO2 95%   Visual Acuity Right Eye Distance:   Left Eye Distance:   Bilateral Distance:    Right Eye Near:   Left Eye Near:    Bilateral Near:     Physical Exam Vitals and nursing note reviewed.  Constitutional:      General: She is awake. She is not in acute distress.    Appearance:  Normal appearance. She is well-developed and well-groomed. She is not ill-appearing.  HENT:     Right Ear: Tympanic membrane, ear canal and external ear normal.     Left Ear: Tympanic membrane, ear canal and external ear normal.     Nose: Congestion and rhinorrhea present.      Mouth/Throat:     Mouth: Mucous membranes are moist.     Pharynx: Posterior oropharyngeal erythema and postnasal drip present. No oropharyngeal exudate.     Tonsils: No tonsillar exudate.  Cardiovascular:     Rate and Rhythm: Normal rate and regular rhythm.  Pulmonary:     Effort: Pulmonary effort is normal.     Breath sounds: Decreased breath sounds present. No wheezing.  Skin:    General: Skin is warm and dry.  Neurological:     Mental Status: She is alert.  Psychiatric:        Behavior: Behavior is cooperative.      UC Treatments / Results  Labs (all labs ordered are listed, but only abnormal results are displayed) Labs Reviewed - No data to display  EKG   Radiology No results found.  Procedures Procedures (including critical care time)  Medications Ordered in UC Medications  ipratropium-albuterol (DUONEB) 0.5-2.5 (3) MG/3ML nebulizer solution 3 mL (3 mLs Nebulization Given 11/18/23 0956)    Initial Impression / Assessment and Plan / UC Course  I have reviewed the triage vital signs and the nursing notes.  Pertinent labs & imaging results that were available during my care of the patient were reviewed by me and considered in my medical decision making (see chart for details).    Patient presented with 5-day history of chills, body aches, cough, and congestion.  Denies any other symptoms.  Denies history of asthma or COPD, but states that she does note cigarettes daily.  Upon assessment patient has diminished breath sounds lung fields, without wheezing.  Productive cough with thick yellow/green mucus.  Noted. Congestion and rhinorrhea are present erythema and postnasal drip noted to pharynx. DuoNeb given in clinic with increased lung sounds, without wheezing.  Prescribed azithromycin and prednisone for upper respiratory infection.  Discussed over-the-counter medication for symptoms.  Discussed follow-up, return, strict ER precautions. Final Clinical Impressions(s) /  UC Diagnoses   Final diagnoses:  Acute upper respiratory infection  Acute cough     Discharge Instructions      Start taking azithromycin and prednisone over the next 5 days.  Continue taking Mucinex for cough and congestion.  Alternate between Tylenol and ibuprofen as needed for body aches and fever.  If you develop severe shortness of breath, chest pain, fevers unrelieved by medication please seek medical treatment in the emergency department.  Follow-up with primary care or return here as needed.     ED Prescriptions     Medication Sig Dispense Auth. Provider   azithromycin (ZITHROMAX Z-PAK) 250 MG tablet Take 2 pills (500mg ) first day and one pill (250mg ) the remaining 4 days. 6 tablet Wynonia Lawman A, NP   predniSONE (DELTASONE) 20 MG tablet Take 2 tablets (40 mg total) by mouth daily for 5 days. 10 tablet Wynonia Lawman A, NP      PDMP not reviewed this encounter.   Wynonia Lawman A, NP 11/18/23 1010

## 2023-11-24 ENCOUNTER — Encounter: Payer: Self-pay | Admitting: Nurse Practitioner

## 2023-11-24 ENCOUNTER — Ambulatory Visit (INDEPENDENT_AMBULATORY_CARE_PROVIDER_SITE_OTHER): Payer: 59 | Admitting: Nurse Practitioner

## 2023-11-24 VITALS — BP 136/70 | HR 65 | Temp 98.5°F | Ht 67.0 in | Wt 216.4 lb

## 2023-11-24 DIAGNOSIS — E66811 Obesity, class 1: Secondary | ICD-10-CM

## 2023-11-24 DIAGNOSIS — E6609 Other obesity due to excess calories: Secondary | ICD-10-CM

## 2023-11-24 DIAGNOSIS — Z794 Long term (current) use of insulin: Secondary | ICD-10-CM

## 2023-11-24 DIAGNOSIS — E039 Hypothyroidism, unspecified: Secondary | ICD-10-CM

## 2023-11-24 DIAGNOSIS — Z2821 Immunization not carried out because of patient refusal: Secondary | ICD-10-CM

## 2023-11-24 DIAGNOSIS — E1122 Type 2 diabetes mellitus with diabetic chronic kidney disease: Secondary | ICD-10-CM

## 2023-11-24 DIAGNOSIS — T466X5A Adverse effect of antihyperlipidemic and antiarteriosclerotic drugs, initial encounter: Secondary | ICD-10-CM | POA: Diagnosis not present

## 2023-11-24 DIAGNOSIS — I129 Hypertensive chronic kidney disease with stage 1 through stage 4 chronic kidney disease, or unspecified chronic kidney disease: Secondary | ICD-10-CM

## 2023-11-24 DIAGNOSIS — N1831 Chronic kidney disease, stage 3a: Secondary | ICD-10-CM

## 2023-11-24 DIAGNOSIS — N183 Chronic kidney disease, stage 3 unspecified: Secondary | ICD-10-CM

## 2023-11-24 DIAGNOSIS — E782 Mixed hyperlipidemia: Secondary | ICD-10-CM | POA: Diagnosis not present

## 2023-11-24 DIAGNOSIS — G720 Drug-induced myopathy: Secondary | ICD-10-CM

## 2023-11-24 DIAGNOSIS — Z6833 Body mass index (BMI) 33.0-33.9, adult: Secondary | ICD-10-CM

## 2023-11-24 MED ORDER — LOSARTAN POTASSIUM 50 MG PO TABS: 50.0000 mg | ORAL_TABLET | Freq: Every morning | ORAL | 1 refills | Status: AC

## 2023-11-24 NOTE — Progress Notes (Signed)
[name], CMA,acting as a Neurosurgeon for [name], FNP.,have documented all relevant documentation on the behalf of [name], FNP,as directed by  [name], FNP while in the presence of [name], FNP.  Subjective:  Patient ID: Margaret Webster , female    DOB: 1961/06/07 , 63 y.o.   MRN: [id number]  Chief Complaint  Patient presents with   Hypertension    HPI  Patient presents today for a bp and dm follow up, Patient reports compliance with medication. Patient denies any chest pain, SOB, or headaches. Patient has no concerns today. She was seen at Urgent care on Monday 11/18/2023 for congestion but feels better. She has not been taking Repatha. She was also changed to a new blood pressure medication. According to the Nephrologist note she was to be holding her Chauncey Mann due to elevated Potassium, she was offered [place] 5mg  but is not sure if she is to take this medication. She has not been taking her metformin since she is taking Ozempic. She is doing well with her Ozempic. She was out of her losartan for about a month, but has been taking the other medication the nephrologist took her off but does not know the name.   She has not done her Cologuard yet but has the box at home.      Past Medical History:  Diagnosis Date   Arthritis    bil shoulders   Bronchitis    Chronic back pain    Chronic kidney disease    Diabetes mellitus    stopped Mrtformin 1 mo ago, made her "sick"   Fatty liver    Hypertension    Hypothyroidism    Stroke [facility])    either 2019/2020   Thyroid disease    Trigger thumb of right hand      Family History  Problem Relation Age of Onset   Diabetes Mother    Cystic fibrosis Sister    Cancer Sister        ?   Breast cancer Maternal Aunt    Heart disease Maternal Grandmother    Clotting disorder Maternal Grandfather      Current Outpatient Medications:    acetaminophen (TYLENOL) 500 MG tablet, Take 1 tablet (500 mg total) by mouth every 6  (six) hours as needed., Disp: 30 tablet, Rfl: 0   amLODipine (NORVASC) 2.5 MG tablet, TAKE 1 TABLET BY MOUTH ONCE  DAILY, Disp: 100 tablet, Rfl: 2   aspirin EC 81 MG tablet, Take 81 mg by mouth in the morning., Disp: , Rfl:    azithromycin (ZITHROMAX Z-PAK) 250 MG tablet, Take 2 pills (500mg ) first day and one pill (250mg ) the remaining 4 days., Disp: 6 tablet, Rfl: 0   benzonatate (TESSALON) 100 MG capsule, Take 1 capsule (100 mg total) by mouth every 8 (eight) hours., Disp: 21 capsule, Rfl: 0   cetirizine (ZYRTEC ALLERGY) 10 MG tablet, Take 1 tablet (10 mg total) by mouth daily., Disp: 30 tablet, Rfl: 2   cyclobenzaprine (FLEXERIL) 10 MG tablet, Take 1 tablet (10 mg total) by mouth 2 (two) times daily as needed for muscle spasms., Disp: 20 tablet, Rfl: 0   diclofenac Sodium (VOLTAREN) 1 % GEL, Apply topically 4 (four) times daily as needed (pain.)., Disp: , Rfl:    DULoxetine (CYMBALTA) 30 MG capsule, Take 60 mg by mouth daily at 2 PM., Disp: , Rfl:    Evolocumab (REPATHA SURECLICK) 140 MG/ML SOAJ, Inject 140 mg into the skin every 14 (fourteen)  days., Disp: 2 mL, Rfl: 5   ezetimibe (ZETIA) 10 MG tablet, Take 1 tablet (10 mg total) by mouth daily., Disp: 90 tablet, Rfl: 2   levothyroxine (SYNTHROID) 75 MCG tablet, Take 1 tablet (75 mcg total) by mouth daily before breakfast., Disp: 90 tablet, Rfl: 1   lidocaine (LIDODERM) 5 %, Place 1 patch onto the skin daily. Remove & Discard patch within 12 hours or as directed by MD, Disp: 30 patch, Rfl: 0   metFORMIN (GLUCOPHAGE-XR) 500 MG 24 hr tablet, TAKE 1 TABLET BY MOUTH ONCE  DAILY WITH BREAKFAST, Disp: 100 tablet, Rfl: 2   Semaglutide,0.25 or 0.5MG /DOS, 2 MG/3ML SOPN, Inject 0.5 mg into the skin once a week., Disp: 9 mL, Rfl: 1   triamcinolone cream (KENALOG) 0.5 %, Apply 1 Application topically 2 (two) times daily., Disp: 60 g, Rfl: 2   Vitamin D, Ergocalciferol, (DRISDOL) 1.25 MG (50000 UNIT) CAPS capsule, Take 1 capsule (50,000 Units total) by mouth  every Monday., Disp: 12 capsule, Rfl: 1   Bempedoic Acid (NEXLETOL) 180 MG TABS, Take 1 tablet (180 mg total) by mouth at bedtime. (Patient not taking: Reported on 11/24/2023), Disp: 90 tablet, Rfl: 2   losartan (COZAAR) 50 MG tablet, Take 1 tablet (50 mg total) by mouth in the morning., Disp: 90 tablet, Rfl: 1   promethazine-dextromethorphan (PROMETHAZINE-DM) 6.25-15 MG/5ML syrup, Take 5 mLs by mouth 4 (four) times daily as needed for cough. (Patient not taking: Reported on 11/24/2023), Disp: 118 mL, Rfl: 0   Allergies  Allergen Reactions   Sulfa Antibiotics Other (See Comments)    Hard to breathe   Amoxil [Amoxicillin]    Ampicillin Other (See Comments) and Hypertension    Bloody stools    Crestor [Rosuvastatin] Other (See Comments)    Makes her mouth numb and causes side pain   Ibuprofen Other (See Comments)    Stomach upset   Polytrim [Polymyxin B-Trimethoprim] Itching and Swelling    redness   Influenza Vaccines Hives   Lipitor [Atorvastatin Calcium] Other (See Comments)    Muscle ache    Naproxen Nausea And Vomiting     Review of Systems  Constitutional: Negative.   HENT:  Positive for congestion.   Eyes: Negative.   Respiratory: Negative.    Cardiovascular: Negative.   Gastrointestinal: Negative.   Neurological: Negative.   Psychiatric/Behavioral: Negative.       Today's Vitals   11/24/23 0832 11/24/23 0904  BP: (!) 140/80 136/70  Pulse: 65   Temp: 98.5 F (36.9 C)   TempSrc: Oral   Weight: 216 lb 6.4 oz (98.2 kg)   Height: 5\' 7"  (1.702 m)   PainSc: 0-No pain    Body mass index is 33.89 kg/m.  Wt Readings from Last 3 Encounters:  11/24/23 216 lb 6.4 oz (98.2 kg)  10/04/23 216 lb (98 kg)  07/21/23 214 lb 12.8 oz (97.4 kg)    Objective:  Physical Exam Vitals reviewed.  Constitutional:      General: She is not in acute distress.    Appearance: Normal appearance.  HENT:     Head: Normocephalic and atraumatic.  Cardiovascular:     Rate and Rhythm: Normal  rate and regular rhythm.     Pulses: Normal pulses.     Heart sounds: Normal heart sounds. No murmur heard. Pulmonary:     Effort: Pulmonary effort is normal. No respiratory distress.     Breath sounds: Normal breath sounds. No wheezing.  Skin:    General: Skin is  warm and dry.     Capillary Refill: Capillary refill takes less than 2 seconds.  Neurological:     General: No focal deficit present.     Mental Status: She is alert and oriented to person, place, and time.     Cranial Nerves: No cranial nerve deficit.     Motor: No weakness.  Psychiatric:        Mood and Affect: Mood normal.        Behavior: Behavior normal.        Thought Content: Thought content normal.        Judgment: Judgment normal.         Assessment And Plan:  Benign hypertension with CKD (chronic kidney disease) stage III (HCC) Assessment & Plan: Blood pressure is well controlled, continue current medications  Orders: -     Losartan Potassium; Take 1 tablet (50 mg total) by mouth in the morning.  Dispense: 90 tablet; Refill: 1 -     CMP14+EGFR -     Microalbumin / creatinine urine ratio  Type 2 diabetes mellitus with stage 3a chronic kidney disease, with long-term current use of insulin (HCC) Assessment & Plan: HgbA1c is stable. Continue current medications. Will check HgbA1c.   Orders: -     Hemoglobin A1c -     Microalbumin / creatinine urine ratio  Mixed hyperlipidemia Assessment & Plan: Continue Repatha and f/u with Cardiology, she has not had her Repatha in quite some time, she is to f/u with her Cardiologist as well.   Orders: -     Lipid panel  Acquired hypothyroidism Assessment & Plan: Will check thyroid levels, no current medications  Orders: -     TSH + free T4  Statin myopathy [G72.0, T46.6X5A] Assessment & Plan: Continue Repatha   COVID-19 vaccination declined  Influenza vaccination declined  Herpes zoster vaccination declined Assessment & Plan: Declines shingrix,  educated on disease process and is aware if he changes his mind to notify office    Class 1 obesity due to excess calories with body mass index (BMI) of 33.0 to 33.9 in adult, unspecified whether serious comorbidity present  Type 2 diabetes mellitus with stage 3a chronic kidney disease, without long-term current use of insulin (HCC) Assessment & Plan: HgbA1c is stable. Continue current medications. Will check HgbA1c.      Return for HM 4 months- reschedule next appt.  Patient was given opportunity to ask questions. Patient verbalized understanding of the plan and was able to repeat key elements of the plan. All questions were answered to their satisfaction.    Jeanell Sparrow, FNP, have reviewed all documentation for this visit. The documentation on 11/24/23 for the exam, diagnosis, procedures, and orders are all accurate and complete.   IF YOU HAVE BEEN REFERRED TO A SPECIALIST, IT MAY TAKE 1-2 WEEKS TO SCHEDULE/PROCESS THE REFERRAL. IF YOU HAVE NOT HEARD FROM US/SPECIALIST IN TWO WEEKS, PLEASE GIVE [place] A CALL AT [phone].

## 2023-11-25 LAB — CMP14+EGFR
ALT: 22 [IU]/L (ref 0–32)
AST: 21 [IU]/L (ref 0–40)
Albumin: 4.3 g/dL (ref 3.9–4.9)
Alkaline Phosphatase: 88 [IU]/L (ref 44–121)
BUN/Creatinine Ratio: 14 (ref 12–28)
BUN: 17 mg/dL (ref 8–27)
Bilirubin Total: 0.3 mg/dL (ref 0.0–1.2)
CO2: 22 mmol/L (ref 20–29)
Calcium: 9.8 mg/dL (ref 8.7–10.3)
Chloride: 101 mmol/L (ref 96–106)
Creatinine, Ser: 1.2 mg/dL — ABNORMAL HIGH (ref 0.57–1.00)
Globulin, Total: 2.6 g/dL (ref 1.5–4.5)
Glucose: 131 mg/dL — ABNORMAL HIGH (ref 70–99)
Potassium: 4.5 mmol/L (ref 3.5–5.2)
Sodium: 142 mmol/L (ref 134–144)
Total Protein: 6.9 g/dL (ref 6.0–8.5)
eGFR: 51 mL/min/{1.73_m2} — ABNORMAL LOW (ref >59)

## 2023-11-25 LAB — HEMOGLOBIN A1C
Est. average glucose Bld gHb Est-mCnc: 128 mg/dL
Hgb A1c MFr Bld: 6.1 % — ABNORMAL HIGH (ref 4.8–5.6)

## 2023-11-25 LAB — LIPID PANEL
Chol/HDL Ratio: 4.6 {ratio} — ABNORMAL HIGH (ref 0.0–4.4)
Cholesterol, Total: 218 mg/dL — ABNORMAL HIGH (ref 100–199)
HDL: 47 mg/dL (ref >39)
LDL Chol Calc (NIH): 139 mg/dL — ABNORMAL HIGH (ref 0–99)
Triglycerides: 180 mg/dL — ABNORMAL HIGH (ref 0–149)
VLDL Cholesterol Cal: 32 mg/dL (ref 5–40)

## 2023-11-25 LAB — MICROALBUMIN / CREATININE URINE RATIO
Creatinine, Urine: 218.2 mg/dL
Microalb/Creat Ratio: 176 mg/g{creat} — ABNORMAL HIGH (ref 0–29)
Microalbumin, Urine: 384.8 ug/mL

## 2023-11-25 LAB — TSH+FREE T4
Free T4: 1.19 ng/dL (ref 0.82–1.77)
TSH: 6.82 u[IU]/mL — ABNORMAL HIGH (ref 0.450–4.500)

## 2023-11-29 ENCOUNTER — Encounter: Payer: Self-pay | Admitting: Nurse Practitioner

## 2023-11-29 DIAGNOSIS — E66811 Obesity, class 1: Secondary | ICD-10-CM | POA: Insufficient documentation

## 2023-11-29 DIAGNOSIS — Z2821 Immunization not carried out because of patient refusal: Secondary | ICD-10-CM | POA: Insufficient documentation

## 2023-11-29 NOTE — Assessment & Plan Note (Signed)
 Declines shingrix, educated on disease process and is aware if he changes his mind to notify office

## 2023-11-29 NOTE — Assessment & Plan Note (Signed)
HgbA1c is stable. Continue current medications. Will check HgbA1c.

## 2023-11-29 NOTE — Assessment & Plan Note (Signed)
Continue Repatha.

## 2023-11-29 NOTE — Assessment & Plan Note (Signed)
 Blood pressure is well controlled, continue current medications.

## 2023-11-29 NOTE — Assessment & Plan Note (Signed)
Will check thyroid levels, no current medications

## 2023-11-29 NOTE — Assessment & Plan Note (Signed)
Continue Repatha and f/u with Cardiology, she has not had her Repatha in quite some time, she is to f/u with her Cardiologist as well.

## 2023-12-07 DIAGNOSIS — E785 Hyperlipidemia, unspecified: Secondary | ICD-10-CM | POA: Diagnosis not present

## 2023-12-07 DIAGNOSIS — I10 Essential (primary) hypertension: Secondary | ICD-10-CM | POA: Diagnosis not present

## 2023-12-07 DIAGNOSIS — R079 Chest pain, unspecified: Secondary | ICD-10-CM | POA: Diagnosis not present

## 2023-12-07 DIAGNOSIS — I7101 Dissection of ascending aorta: Secondary | ICD-10-CM | POA: Diagnosis not present

## 2023-12-07 DIAGNOSIS — I7103 Dissection of thoracoabdominal aorta: Secondary | ICD-10-CM | POA: Diagnosis not present

## 2023-12-08 ENCOUNTER — Encounter: Payer: Self-pay | Admitting: Nurse Practitioner

## 2023-12-08 DIAGNOSIS — G459 Transient cerebral ischemic attack, unspecified: Secondary | ICD-10-CM | POA: Diagnosis not present

## 2023-12-08 DIAGNOSIS — Z789 Other specified health status: Secondary | ICD-10-CM | POA: Diagnosis not present

## 2023-12-08 DIAGNOSIS — R2990 Unspecified symptoms and signs involving the nervous system: Secondary | ICD-10-CM | POA: Diagnosis not present

## 2023-12-08 NOTE — Code Documentation (Signed)
Do you mean from the plan/problem list?

## 2023-12-09 DIAGNOSIS — R2990 Unspecified symptoms and signs involving the nervous system: Secondary | ICD-10-CM | POA: Diagnosis not present

## 2023-12-09 DIAGNOSIS — Z789 Other specified health status: Secondary | ICD-10-CM | POA: Diagnosis not present

## 2023-12-10 DIAGNOSIS — J9601 Acute respiratory failure with hypoxia: Secondary | ICD-10-CM | POA: Diagnosis not present

## 2023-12-10 DIAGNOSIS — Z789 Other specified health status: Secondary | ICD-10-CM | POA: Diagnosis not present

## 2023-12-10 DIAGNOSIS — G9340 Encephalopathy, unspecified: Secondary | ICD-10-CM | POA: Diagnosis not present

## 2023-12-10 DIAGNOSIS — I7101 Dissection of ascending aorta: Secondary | ICD-10-CM | POA: Diagnosis not present

## 2023-12-10 DIAGNOSIS — R2990 Unspecified symptoms and signs involving the nervous system: Secondary | ICD-10-CM | POA: Diagnosis not present

## 2023-12-10 DIAGNOSIS — R918 Other nonspecific abnormal finding of lung field: Secondary | ICD-10-CM | POA: Diagnosis not present

## 2023-12-11 DIAGNOSIS — J90 Pleural effusion, not elsewhere classified: Secondary | ICD-10-CM | POA: Diagnosis not present

## 2023-12-11 DIAGNOSIS — J9601 Acute respiratory failure with hypoxia: Secondary | ICD-10-CM | POA: Diagnosis not present

## 2023-12-11 DIAGNOSIS — I7101 Dissection of ascending aorta: Secondary | ICD-10-CM | POA: Diagnosis not present

## 2023-12-12 DIAGNOSIS — J9601 Acute respiratory failure with hypoxia: Secondary | ICD-10-CM | POA: Diagnosis not present

## 2023-12-12 DIAGNOSIS — J90 Pleural effusion, not elsewhere classified: Secondary | ICD-10-CM | POA: Diagnosis not present

## 2023-12-12 DIAGNOSIS — I7101 Dissection of ascending aorta: Secondary | ICD-10-CM | POA: Diagnosis not present

## 2023-12-13 DIAGNOSIS — J90 Pleural effusion, not elsewhere classified: Secondary | ICD-10-CM | POA: Diagnosis not present

## 2023-12-13 DIAGNOSIS — J9601 Acute respiratory failure with hypoxia: Secondary | ICD-10-CM | POA: Diagnosis not present

## 2023-12-13 DIAGNOSIS — R079 Chest pain, unspecified: Secondary | ICD-10-CM | POA: Diagnosis not present

## 2023-12-13 DIAGNOSIS — I7101 Dissection of ascending aorta: Secondary | ICD-10-CM | POA: Diagnosis not present

## 2023-12-14 DIAGNOSIS — R918 Other nonspecific abnormal finding of lung field: Secondary | ICD-10-CM | POA: Diagnosis not present

## 2023-12-14 DIAGNOSIS — I7101 Dissection of ascending aorta: Secondary | ICD-10-CM | POA: Diagnosis not present

## 2023-12-14 DIAGNOSIS — R531 Weakness: Secondary | ICD-10-CM | POA: Diagnosis not present

## 2023-12-14 DIAGNOSIS — J9600 Acute respiratory failure, unspecified whether with hypoxia or hypercapnia: Secondary | ICD-10-CM | POA: Diagnosis not present

## 2023-12-14 DIAGNOSIS — R2990 Unspecified symptoms and signs involving the nervous system: Secondary | ICD-10-CM | POA: Diagnosis not present

## 2023-12-14 DIAGNOSIS — R079 Chest pain, unspecified: Secondary | ICD-10-CM | POA: Diagnosis not present

## 2023-12-14 DIAGNOSIS — G9340 Encephalopathy, unspecified: Secondary | ICD-10-CM | POA: Diagnosis not present

## 2023-12-14 DIAGNOSIS — J9601 Acute respiratory failure with hypoxia: Secondary | ICD-10-CM | POA: Diagnosis not present

## 2023-12-14 DIAGNOSIS — Z789 Other specified health status: Secondary | ICD-10-CM | POA: Diagnosis not present

## 2023-12-15 DIAGNOSIS — I4891 Unspecified atrial fibrillation: Secondary | ICD-10-CM | POA: Diagnosis not present

## 2023-12-15 DIAGNOSIS — I7100 Dissection of unspecified site of aorta: Secondary | ICD-10-CM | POA: Diagnosis not present

## 2023-12-15 DIAGNOSIS — I7101 Dissection of ascending aorta: Secondary | ICD-10-CM | POA: Diagnosis not present

## 2023-12-15 DIAGNOSIS — J9600 Acute respiratory failure, unspecified whether with hypoxia or hypercapnia: Secondary | ICD-10-CM | POA: Diagnosis not present

## 2023-12-15 DIAGNOSIS — G9340 Encephalopathy, unspecified: Secondary | ICD-10-CM | POA: Diagnosis not present

## 2023-12-15 DIAGNOSIS — J9601 Acute respiratory failure with hypoxia: Secondary | ICD-10-CM | POA: Diagnosis not present

## 2023-12-15 DIAGNOSIS — I499 Cardiac arrhythmia, unspecified: Secondary | ICD-10-CM | POA: Diagnosis not present

## 2023-12-15 DIAGNOSIS — R918 Other nonspecific abnormal finding of lung field: Secondary | ICD-10-CM | POA: Diagnosis not present

## 2023-12-15 DIAGNOSIS — R531 Weakness: Secondary | ICD-10-CM | POA: Diagnosis not present

## 2023-12-15 NOTE — Telephone Encounter (Signed)
 Closing encounter

## 2023-12-16 DIAGNOSIS — R531 Weakness: Secondary | ICD-10-CM | POA: Diagnosis not present

## 2023-12-16 DIAGNOSIS — G9340 Encephalopathy, unspecified: Secondary | ICD-10-CM | POA: Diagnosis not present

## 2023-12-16 DIAGNOSIS — J9600 Acute respiratory failure, unspecified whether with hypoxia or hypercapnia: Secondary | ICD-10-CM | POA: Diagnosis not present

## 2023-12-16 DIAGNOSIS — R918 Other nonspecific abnormal finding of lung field: Secondary | ICD-10-CM | POA: Diagnosis not present

## 2023-12-16 DIAGNOSIS — J9601 Acute respiratory failure with hypoxia: Secondary | ICD-10-CM | POA: Diagnosis not present

## 2023-12-16 DIAGNOSIS — I4819 Other persistent atrial fibrillation: Secondary | ICD-10-CM | POA: Diagnosis not present

## 2023-12-16 DIAGNOSIS — I7101 Dissection of ascending aorta: Secondary | ICD-10-CM | POA: Diagnosis not present

## 2023-12-17 DIAGNOSIS — G9340 Encephalopathy, unspecified: Secondary | ICD-10-CM | POA: Diagnosis not present

## 2023-12-17 DIAGNOSIS — I7101 Dissection of ascending aorta: Secondary | ICD-10-CM | POA: Diagnosis not present

## 2023-12-17 DIAGNOSIS — J9601 Acute respiratory failure with hypoxia: Secondary | ICD-10-CM | POA: Diagnosis not present

## 2023-12-17 DIAGNOSIS — I7100 Dissection of unspecified site of aorta: Secondary | ICD-10-CM | POA: Diagnosis not present

## 2023-12-17 DIAGNOSIS — I480 Paroxysmal atrial fibrillation: Secondary | ICD-10-CM | POA: Diagnosis not present

## 2023-12-17 DIAGNOSIS — J9600 Acute respiratory failure, unspecified whether with hypoxia or hypercapnia: Secondary | ICD-10-CM | POA: Diagnosis not present

## 2023-12-17 DIAGNOSIS — R918 Other nonspecific abnormal finding of lung field: Secondary | ICD-10-CM | POA: Diagnosis not present

## 2023-12-17 DIAGNOSIS — R531 Weakness: Secondary | ICD-10-CM | POA: Diagnosis not present

## 2023-12-18 DIAGNOSIS — I4891 Unspecified atrial fibrillation: Secondary | ICD-10-CM | POA: Diagnosis not present

## 2023-12-18 DIAGNOSIS — R531 Weakness: Secondary | ICD-10-CM | POA: Diagnosis not present

## 2023-12-18 DIAGNOSIS — G9340 Encephalopathy, unspecified: Secondary | ICD-10-CM | POA: Diagnosis not present

## 2023-12-18 DIAGNOSIS — J9600 Acute respiratory failure, unspecified whether with hypoxia or hypercapnia: Secondary | ICD-10-CM | POA: Diagnosis not present

## 2023-12-19 DIAGNOSIS — R531 Weakness: Secondary | ICD-10-CM | POA: Diagnosis not present

## 2023-12-19 DIAGNOSIS — G9340 Encephalopathy, unspecified: Secondary | ICD-10-CM | POA: Diagnosis not present

## 2023-12-19 DIAGNOSIS — J9600 Acute respiratory failure, unspecified whether with hypoxia or hypercapnia: Secondary | ICD-10-CM | POA: Diagnosis not present

## 2023-12-21 ENCOUNTER — Telehealth: Payer: Self-pay

## 2023-12-21 NOTE — Transitions of Care (Post Inpatient/ED Visit) (Signed)
   12/21/2023  Name: Margaret Webster MRN: [id number] DOB: October 02, 1961  Today's TOC FU Call Status: Today's TOC FU Call Status:: Unsuccessful Call (1st Attempt) Unsuccessful Call (1st Attempt) Date: 12/21/23  Attempted to reach the patient regarding the most recent Inpatient/ED visit.  Follow Up Plan: Additional outreach attempts will be made to reach the patient to complete the Transitions of Care (Post Inpatient/ED visit) call.   Chava Dulac A. Mliss Fritz RN, BA, [facility], CRRN [facility] RN Care Manager, Transition of Care [phone]

## 2023-12-22 ENCOUNTER — Telehealth: Payer: Self-pay

## 2023-12-22 NOTE — Transitions of Care (Post Inpatient/ED Visit) (Signed)
   12/22/2023  Name: Margaret Webster MRN: [id number] DOB: November 27, 1960  Today's TOC FU Call Status: Today's TOC FU Call Status:: Unsuccessful Call (2nd Attempt) Unsuccessful Call (2nd Attempt) Date: 12/22/23  Attempted to reach the patient regarding the most recent Inpatient/ED visit.  Follow Up Plan: Additional outreach attempts will be made to reach the patient to complete the Transitions of Care (Post Inpatient/ED visit) call.   Avanell Banwart A. Mliss Fritz RN, BA, [facility], CRRN [facility] RN Care Manager, Transition of Care [phone]

## 2023-12-23 ENCOUNTER — Telehealth: Payer: Self-pay

## 2023-12-23 DIAGNOSIS — Z1212 Encounter for screening for malignant neoplasm of rectum: Secondary | ICD-10-CM | POA: Diagnosis not present

## 2023-12-23 DIAGNOSIS — Z1211 Encounter for screening for malignant neoplasm of colon: Secondary | ICD-10-CM | POA: Diagnosis not present

## 2023-12-23 NOTE — Transitions of Care (Post Inpatient/ED Visit) (Signed)
   12/23/2023  Name: Margaret Webster MRN: [id number] DOB: 02/15/1961  Today's TOC FU Call Status: Today's TOC FU Call Status:: Unsuccessful Call (3rd Attempt) Unsuccessful Call (3rd Attempt) Date: 12/23/23  Attempted to reach the patient regarding the most recent Inpatient/ED visit.  Follow Up Plan: No further outreach attempts will be made at this time. We have been unable to contact the patient.  Danel Studzinski A. Mliss Fritz RN, BA, [facility], CRRN [facility] RN Care Manager, Transition of Care [phone]

## 2023-12-27 LAB — COLOGUARD: COLOGUARD: POSITIVE — AB

## 2024-01-02 ENCOUNTER — Other Ambulatory Visit: Payer: Self-pay

## 2024-01-02 ENCOUNTER — Ambulatory Visit (HOSPITAL_COMMUNITY)
Admission: EM | Admit: 2024-01-02 | Discharge: 2024-01-02 | Disposition: A | Discharge status: Home / Self Care | Attending: Emergency Medicine | Admitting: Emergency Medicine

## 2024-01-02 ENCOUNTER — Encounter (HOSPITAL_COMMUNITY): Payer: Self-pay | Admitting: Emergency Medicine

## 2024-01-02 DIAGNOSIS — J019 Acute sinusitis, unspecified: Secondary | ICD-10-CM

## 2024-01-02 DIAGNOSIS — B9689 Other specified bacterial agents as the cause of diseases classified elsewhere: Secondary | ICD-10-CM | POA: Diagnosis not present

## 2024-01-02 MED ORDER — DOXYCYCLINE HYCLATE 100 MG PO CAPS: 100.0000 mg | ORAL_CAPSULE | Freq: Two times a day (BID) | ORAL | 0 refills | Status: AC

## 2024-01-02 NOTE — ED Triage Notes (Signed)
 Bilateral ear pain and nasal congestion for a week now.

## 2024-01-02 NOTE — ED Provider Notes (Signed)
 [facility]    CSN: [id number] Arrival date & time: 01/02/24  1256      History   Chief Complaint Chief Complaint  Patient presents with   Otalgia    HPI Margaret Webster is a 63 y.o. female.  1+ week history of nasal congestion, sinus pressure, drainage, bilateral ear pain that radiates to the jaw.  Symptoms are more severe on the left side of her face. No known fevers but does report chills Has used mucinex and ear drops   Past Medical History:  Diagnosis Date   Acute pain of right shoulder 09/19/2019   Arthritis    bil shoulders   Bronchitis    Chronic back pain    Chronic kidney disease    Diabetes mellitus    stopped Mrtformin 1 mo ago, made her "sick"   Fatty liver    Hypertension    Hypothyroidism    Pain 04/07/2014   Stroke (HCC)    either 2019/2020   Thyroid disease    Trigger thumb of right hand     Patient Active Problem List   Diagnosis Date Noted   COVID-19 vaccination declined 11/29/2023   Influenza vaccination declined 11/29/2023   Class 1 obesity due to excess calories with body mass index (BMI) of 33.0 to 33.9 in adult 11/29/2023   Type 2 diabetes mellitus with stage 3a chronic kidney disease (HCC) 11/24/2023   Sleep apnea with use of continuous positive airway pressure (CPAP) 08/20/2023   Statin myopathy 08/20/2023   Need for zoster vaccination 07/30/2023   Fatigue 07/30/2023   Rash and nonspecific skin eruption 05/21/2023   Post-nasal drainage 05/21/2023   Obesity (BMI 35.0-39.9 without comorbidity) 05/21/2023   Herpes zoster vaccination declined 05/21/2023   Vitamin D deficiency 05/21/2023   Mixed hyperlipidemia 05/21/2023   PAD (peripheral artery disease) (HCC) 11/27/2022   Depression 07/05/2020   Ringing in ears 07/05/2020   CVA (cerebral vascular accident) (HCC) 02/26/2020   Bradycardia    Elevated TSH    Multiple joint pain 11/07/2019   Right hip pain 09/19/2019   Mass of joint of finger 07/26/2015   Hypothyroidism  01/10/2015   Benign hypertension with CKD (chronic kidney disease) stage III (HCC) 01/10/2015   Boil of buttock 04/06/2014   Female genital symptoms 11/16/2013   Candidiasis of vulva and vagina 11/16/2013   Pain aggravated by activities of daily living 06/07/2013   S/P cervical spinal fusion 01/17/2013   Cervical radiculopathy 12/13/2012   Carpal tunnel syndrome on both sides 12/06/2012   Cervical spondylosis with myelopathy 12/06/2012   Nonspecific abnormal finding in stool contents 12/02/2011   Chronic back pain     Past Surgical History:  Procedure Laterality Date   BACK SURGERY  1993   CARPAL TUNNEL RELEASE  08/2015   CARPAL TUNNEL RELEASE Left 09/10/2017   Procedure: CARPAL TUNNEL RELEASE;  Surgeon: Betha Loa, MD;  Location: [facility];  Service: Orthopedics;  Laterality: Left;   CERVICAL SPINE SURGERY Right 11/2012   ESOPHAGOGASTRODUODENOSCOPY (EGD) WITH PROPOFOL N/A 08/01/2022   Procedure: ESOPHAGOGASTRODUODENOSCOPY (EGD) WITH PROPOFOL;  Surgeon: Jeani Hawking, MD;  Location: [facility];  Service: Gastroenterology;  Laterality: N/A;   OOPHORECTOMY     TRIGGER FINGER RELEASE Right 08/06/2017   Procedure: RIGHT THUMB TRIGGER RELEASE;  Surgeon: Betha Loa, MD;  [facility];  Service: Orthopedics;  Laterality: Right;   ULNAR COLLATERAL LIGAMENT REPAIR Right 03/21/2021   Procedure: RIGHT THUMB ULNAR COLLATERAL LIGAMENT REPAIR;  Surgeon: Betha Loa, MD;  Location: [facility];  Service: Orthopedics;  Laterality: Right;   UPPER ESOPHAGEAL ENDOSCOPIC ULTRASOUND (EUS) N/A 08/01/2022   Procedure: UPPER ESOPHAGEAL ENDOSCOPIC ULTRASOUND (EUS);  Surgeon: Jeani Hawking, MD;  Location: [facility];  Service: Gastroenterology;  Laterality: N/A;    OB History     Gravida  1   Para  0   Term  0   Preterm  0   AB  0   Living  1      SAB  0   IAB  0   Ectopic  0   Multiple  0   Live Births  1             Home Medications    Prior to Admission medications   Medication Sig Start Date End Date Taking? Authorizing Provider  doxycycline (VIBRAMYCIN) 100 MG capsule Take 1 capsule (100 mg total) by mouth 2 (two) times daily for 7 days. 01/02/24 01/09/24 Yes Jefferie Holston, Lurena Joiner, PA-C  acetaminophen (TYLENOL) 500 MG tablet Take 1 tablet (500 mg total) by mouth every 6 (six) hours as needed. 11/08/22   Mardella Layman, MD  amLODipine (NORVASC) 2.5 MG tablet TAKE 1 TABLET BY MOUTH ONCE  DAILY 06/15/23   [name], FNP  aspirin EC 81 MG tablet Take 81 mg by mouth in the morning.    [provider]  Bempedoic Acid (NEXLETOL) 180 MG TABS Take 1 tablet (180 mg total) by mouth at bedtime. Patient not taking: Reported on 11/24/2023 04/17/23   Chrystie Nose, MD  cetirizine (ZYRTEC ALLERGY) 10 MG tablet Take 1 tablet (10 mg total) by mouth daily. 05/21/23 05/20/24  [name], FNP  cyclobenzaprine (FLEXERIL) 10 MG tablet Take 1 tablet (10 mg total) by mouth 2 (two) times daily as needed for muscle spasms. 10/04/23   Alvira Monday, MD  diclofenac Sodium (VOLTAREN) 1 % GEL Apply topically 4 (four) times daily as needed (pain.).    [provider]  DULoxetine (CYMBALTA) 30 MG capsule Take 60 mg by mouth daily at 2 PM. 07/06/22   [provider]  Evolocumab (REPATHA SURECLICK) 140 MG/ML SOAJ Inject 140 mg into the skin every 14 (fourteen) days. 04/17/23   Hilty, Lisette Abu, MD  ezetimibe (ZETIA) 10 MG tablet Take 1 tablet (10 mg total) by mouth daily. 04/17/23   Hilty, Lisette Abu, MD  levothyroxine (SYNTHROID) 75 MCG tablet Take 1 tablet (75 mcg total) by mouth daily before breakfast. 09/15/23   [name], FNP  lidocaine (LIDODERM) 5 % Place 1 patch onto the skin daily. Remove & Discard patch within 12 hours or as directed by MD 10/04/23   Alvira Monday, MD  losartan (COZAAR) 50 MG tablet Take 1 tablet (50 mg total) by mouth in the morning. 11/24/23   [name], FNP  metFORMIN  (GLUCOPHAGE-XR) 500 MG 24 hr tablet TAKE 1 TABLET BY MOUTH ONCE  DAILY WITH BREAKFAST 06/15/23   [name], FNP  Semaglutide,0.25 or 0.5MG /DOS, 2 MG/3ML SOPN Inject 0.5 mg into the skin once a week. 07/21/23   [name], FNP  triamcinolone cream (KENALOG) 0.5 % Apply 1 Application topically 2 (two) times daily. 07/21/23   [name], FNP  Vitamin D, Ergocalciferol, (DRISDOL) 1.25 MG (50000 UNIT) CAPS capsule Take 1 capsule (50,000 Units total) by mouth every Monday. 06/01/23   [name], FNP    Family History Family History  Problem Relation Age of Onset   Diabetes Mother    Cystic  fibrosis Sister    Cancer Sister        ?   Breast cancer Maternal Aunt    Heart disease Maternal Grandmother    Clotting disorder Maternal Grandfather     Social History Social History   Tobacco Use   Smoking status: Every Day    Current packs/day: 0.25    Average packs/day: 0.3 packs/day for 45.0 years (11.3 ttl pk-yrs)    Types: Cigarettes   Smokeless tobacco: Never   Tobacco comments:    4 cig a day, not interested in chantix, 11/27/22 - she is smoking one cigarette a day; 11/24/23 cutting back on her smoking  Vaping Use   Vaping status: Never Used  Substance Use Topics   Alcohol use: Not Currently    Comment: occasional   Drug use: Yes    Types: Marijuana     Allergies   Sulfa antibiotics, Amoxil [amoxicillin], Ampicillin, Crestor [rosuvastatin], Ibuprofen, Polytrim [polymyxin b-trimethoprim], Influenza vaccines, Lipitor [atorvastatin calcium], and Naproxen   Review of Systems Review of Systems Per HPI  Physical Exam Triage Vital Signs ED Triage Vitals [01/02/24 1315]  Encounter Vitals Group     BP (!) 143/85     Systolic BP Percentile      Diastolic BP Percentile      Pulse Rate 66     Resp 18     Temp 98.5 F (36.9 C)     Temp Source Oral     SpO2 99 %     Weight      Height      Head Circumference      Peak Flow      Pain Score 6     Pain Loc      Pain  Education      Exclude from Growth Chart    No data found.  Updated Vital Signs BP (!) 143/85 (BP Location: Left Arm)   Pulse 66   Temp 98.5 F (36.9 C) (Oral)   Resp 18   SpO2 99%   Visual Acuity Right Eye Distance:   Left Eye Distance:   Bilateral Distance:    Right Eye Near:   Left Eye Near:    Bilateral Near:     Physical Exam Vitals and nursing note reviewed.  Constitutional:      General: She is not in acute distress.    Appearance: She is not ill-appearing.  HENT:     Right Ear: Tympanic membrane, ear canal and external ear normal.     Left Ear: Tympanic membrane, ear canal and external ear normal.     Nose: Congestion present. No rhinorrhea.     Right Sinus: Maxillary sinus tenderness and frontal sinus tenderness present.     Left Sinus: Maxillary sinus tenderness and frontal sinus tenderness present.     Mouth/Throat:     Mouth: Mucous membranes are moist.     Pharynx: Oropharynx is clear. No posterior oropharyngeal erythema.  Eyes:     Conjunctiva/sclera: Conjunctivae normal.  Cardiovascular:     Rate and Rhythm: Normal rate and regular rhythm.     Pulses: Normal pulses.     Heart sounds: Normal heart sounds.  Pulmonary:     Effort: Pulmonary effort is normal. No respiratory distress.     Breath sounds: Normal breath sounds. No wheezing or rales.  Musculoskeletal:     Cervical back: Normal range of motion.  Lymphadenopathy:     Cervical: No cervical adenopathy.  Skin:    General:  Skin is warm and dry.  Neurological:     Mental Status: She is alert and oriented to person, place, and time.      UC Treatments / Results  Labs (all labs ordered are listed, but only abnormal results are displayed) Labs Reviewed - No data to display  EKG   Radiology No results found.  Procedures Procedures (including critical care time)  Medications Ordered in UC Medications - No data to display  Initial Impression / Assessment and Plan / UC Course  I have  reviewed the triage vital signs and the nursing notes.  Pertinent labs & imaging results that were available during my care of the patient were reviewed by me and considered in my medical decision making (see chart for details).  1+ week history of increasing sinus congestion, drainage, ear pain.  The ear and jaw pain are likely related to sinuses.  There is no sign of ear infection at this time.  Tender with palpation over sinuses. Treat for acute bacterial sinusitis with doxycycline twice a day for 7 days.  Advise reasons to return to clinic, patient is agreeable to plan, no questions at this time  Final Clinical Impressions(s) / UC Diagnoses   Final diagnoses:  Acute bacterial sinusitis     Discharge Instructions      Please take the Doxycycline (antibiotic) as prescribed. Take with food to avoid upset stomach. Finish the full course! It can take 2-3 days to start working.    ED Prescriptions     Medication Sig Dispense Auth. Provider   doxycycline (VIBRAMYCIN) 100 MG capsule Take 1 capsule (100 mg total) by mouth 2 (two) times daily for 7 days. 14 capsule Kahla Risdon, Lurena Joiner, PA-C      PDMP not reviewed this encounter.   Keirstyn Aydt, Lurena Joiner, PA-C 01/02/24 1420

## 2024-01-02 NOTE — Discharge Instructions (Signed)
 Please take the Doxycycline (antibiotic) as prescribed. Take with food to avoid upset stomach. Finish the full course! It can take 2-3 days to start working.

## 2024-01-19 ENCOUNTER — Ambulatory Visit: Payer: 59 | Admitting: Nurse Practitioner

## 2024-01-19 DIAGNOSIS — E785 Hyperlipidemia, unspecified: Secondary | ICD-10-CM | POA: Diagnosis not present

## 2024-01-19 DIAGNOSIS — I10 Essential (primary) hypertension: Secondary | ICD-10-CM | POA: Diagnosis not present

## 2024-01-19 DIAGNOSIS — I480 Paroxysmal atrial fibrillation: Secondary | ICD-10-CM | POA: Diagnosis not present

## 2024-01-19 DIAGNOSIS — I71019 Dissection of thoracic aorta, unspecified: Secondary | ICD-10-CM | POA: Diagnosis not present

## 2024-02-01 ENCOUNTER — Ambulatory Visit (HOSPITAL_COMMUNITY): Admission: EM | Admit: 2024-02-01 | Discharge: 2024-02-01 | Disposition: A | Discharge status: Home / Self Care

## 2024-02-01 ENCOUNTER — Encounter (HOSPITAL_COMMUNITY): Payer: Self-pay

## 2024-02-01 DIAGNOSIS — S0083XA Contusion of other part of head, initial encounter: Secondary | ICD-10-CM | POA: Diagnosis not present

## 2024-02-01 NOTE — ED Provider Notes (Signed)
 [facility]    CSN: [id number] Arrival date & time: 02/01/24  1513      History   Chief Complaint No chief complaint on file.   HPI Margaret Webster is a 63 y.o. female.   Patient reports that she was in a game room on Thursday night and the game screen fell hitting her in the forehead.  Patient states that she has had a headache since.  Patient has been taking Tylenol for her headache.  Patient reports she has had some soreness in her neck.  Patient did not lose consciousness.  She denies any nausea or vomiting she has not had any visual or hearing changes.  Patient reports she has been able to do her normal activities.  The history is provided by the patient. No language interpreter was used.    Past Medical History:  Diagnosis Date   Acute pain of right shoulder 09/19/2019   Arthritis    bil shoulders   Bronchitis    Chronic back pain    Chronic kidney disease    Diabetes mellitus    stopped Mrtformin 1 mo ago, made her "sick"   Fatty liver    Hypertension    Hypothyroidism    Pain 04/07/2014   Stroke (HCC)    either 2019/2020   Thyroid disease    Trigger thumb of right hand     Patient Active Problem List   Diagnosis Date Noted   COVID-19 vaccination declined 11/29/2023   Influenza vaccination declined 11/29/2023   Class 1 obesity due to excess calories with body mass index (BMI) of 33.0 to 33.9 in adult 11/29/2023   Type 2 diabetes mellitus with stage 3a chronic kidney disease (HCC) 11/24/2023   Sleep apnea with use of continuous positive airway pressure (CPAP) 08/20/2023   Statin myopathy 08/20/2023   Need for zoster vaccination 07/30/2023   Fatigue 07/30/2023   Rash and nonspecific skin eruption 05/21/2023   Post-nasal drainage 05/21/2023   Obesity (BMI 35.0-39.9 without comorbidity) 05/21/2023   Herpes zoster vaccination declined 05/21/2023   Vitamin D deficiency 05/21/2023   Mixed hyperlipidemia 05/21/2023   PAD (peripheral artery disease)  (HCC) 11/27/2022   Depression 07/05/2020   Ringing in ears 07/05/2020   CVA (cerebral vascular accident) (HCC) 02/26/2020   Bradycardia    Elevated TSH    Multiple joint pain 11/07/2019   Right hip pain 09/19/2019   Mass of joint of finger 07/26/2015   Hypothyroidism 01/10/2015   Benign hypertension with CKD (chronic kidney disease) stage III (HCC) 01/10/2015   Boil of buttock 04/06/2014   Female genital symptoms 11/16/2013   Candidiasis of vulva and vagina 11/16/2013   Pain aggravated by activities of daily living 06/07/2013   S/P cervical spinal fusion 01/17/2013   Cervical radiculopathy 12/13/2012   Carpal tunnel syndrome on both sides 12/06/2012   Cervical spondylosis with myelopathy 12/06/2012   Nonspecific abnormal finding in stool contents 12/02/2011   Chronic back pain     Past Surgical History:  Procedure Laterality Date   BACK SURGERY  1993   CARPAL TUNNEL RELEASE  08/2015   CARPAL TUNNEL RELEASE Left 09/10/2017   Procedure: CARPAL TUNNEL RELEASE;  Surgeon: Betha Loa, MD;  Location: [facility];  Service: Orthopedics;  Laterality: Left;   CERVICAL SPINE SURGERY Right 11/2012   ESOPHAGOGASTRODUODENOSCOPY (EGD) WITH PROPOFOL N/A 08/01/2022   Procedure: ESOPHAGOGASTRODUODENOSCOPY (EGD) WITH PROPOFOL;  Surgeon: Jeani Hawking, MD;  Location: [facility];  Service: Gastroenterology;  Laterality: N/A;  OOPHORECTOMY     TRIGGER FINGER RELEASE Right 08/06/2017   Procedure: RIGHT THUMB TRIGGER RELEASE;  Surgeon: Betha Loa, MD;  Location: [facility];  Service: Orthopedics;  Laterality: Right;   ULNAR COLLATERAL LIGAMENT REPAIR Right 03/21/2021   Procedure: RIGHT THUMB ULNAR COLLATERAL LIGAMENT REPAIR;  Surgeon: Betha Loa, MD;  Location: [facility];  Service: Orthopedics;  Laterality: Right;   UPPER ESOPHAGEAL ENDOSCOPIC ULTRASOUND (EUS) N/A 08/01/2022   Procedure: UPPER ESOPHAGEAL ENDOSCOPIC ULTRASOUND (EUS);  Surgeon: Jeani Hawking, MD;  Location: [facility];  Service: Gastroenterology;  Laterality: N/A;    OB History     Gravida  1   Para  0   Term  0   Preterm  0   AB  0   Living  1      SAB  0   IAB  0   Ectopic  0   Multiple  0   Live Births  1            Home Medications    Prior to Admission medications   Medication Sig Start Date End Date Taking? Authorizing Provider  acetaminophen (TYLENOL) 500 MG tablet Take 1 tablet (500 mg total) by mouth every 6 (six) hours as needed. 11/08/22   Mardella Layman, MD  amLODipine (NORVASC) 2.5 MG tablet TAKE 1 TABLET BY MOUTH ONCE  DAILY 06/15/23   [name], FNP  aspirin EC 81 MG tablet Take 81 mg by mouth in the morning.    [provider]  Bempedoic Acid (NEXLETOL) 180 MG TABS Take 1 tablet (180 mg total) by mouth at bedtime. Patient not taking: Reported on 11/24/2023 04/17/23   Chrystie Nose, MD  cetirizine (ZYRTEC ALLERGY) 10 MG tablet Take 1 tablet (10 mg total) by mouth daily. 05/21/23 05/20/24  [name], FNP  cyclobenzaprine (FLEXERIL) 10 MG tablet Take 1 tablet (10 mg total) by mouth 2 (two) times daily as needed for muscle spasms. 10/04/23   Alvira Monday, MD  diclofenac Sodium (VOLTAREN) 1 % GEL Apply topically 4 (four) times daily as needed (pain.).    [provider]  DULoxetine (CYMBALTA) 30 MG capsule Take 60 mg by mouth daily at 2 PM. 07/06/22   [provider]  Evolocumab (REPATHA SURECLICK) 140 MG/ML SOAJ Inject 140 mg into the skin every 14 (fourteen) days. 04/17/23   Hilty, Lisette Abu, MD  ezetimibe (ZETIA) 10 MG tablet Take 1 tablet (10 mg total) by mouth daily. 04/17/23   Hilty, Lisette Abu, MD  levothyroxine (SYNTHROID) 75 MCG tablet Take 1 tablet (75 mcg total) by mouth daily before breakfast. 09/15/23   [name], FNP  lidocaine (LIDODERM) 5 % Place 1 patch onto the skin daily. Remove & Discard patch within 12 hours or as directed by MD 10/04/23   Alvira Monday, MD  losartan (COZAAR)  50 MG tablet Take 1 tablet (50 mg total) by mouth in the morning. 11/24/23   [name], FNP  metFORMIN (GLUCOPHAGE-XR) 500 MG 24 hr tablet TAKE 1 TABLET BY MOUTH ONCE  DAILY WITH BREAKFAST 06/15/23   [name], FNP  Semaglutide,0.25 or 0.5MG /DOS, 2 MG/3ML SOPN Inject 0.5 mg into the skin once a week. 07/21/23   [name], FNP  triamcinolone cream (KENALOG) 0.5 % Apply 1 Application topically 2 (two) times daily. 07/21/23   [name], FNP  Vitamin D, Ergocalciferol, (DRISDOL) 1.25 MG (50000 UNIT) CAPS capsule Take 1 capsule (50,000 Units total) by mouth every Monday. 06/01/23  [name], FNP    Family History Family History  Problem Relation Age of Onset   Diabetes Mother    Cystic fibrosis Sister    Cancer Sister        ?   Breast cancer Maternal Aunt    Heart disease Maternal Grandmother    Clotting disorder Maternal Grandfather     Social History Social History   Tobacco Use   Smoking status: Every Day    Current packs/day: 0.25    Average packs/day: 0.3 packs/day for 45.0 years (11.3 ttl pk-yrs)    Types: Cigarettes   Smokeless tobacco: Never   Tobacco comments:    4 cig a day, not interested in chantix, 11/27/22 - she is smoking one cigarette a day; 11/24/23 cutting back on her smoking  Vaping Use   Vaping status: Never Used  Substance Use Topics   Alcohol use: Yes    Comment: occasional   Drug use: Yes    Types: Marijuana     Allergies   Sulfa antibiotics, Amoxil [amoxicillin], Ampicillin, Crestor [rosuvastatin], Ibuprofen, Polytrim [polymyxin b-trimethoprim], Influenza vaccines, Lipitor [atorvastatin calcium], and Naproxen   Review of Systems Review of Systems  All other systems reviewed and are negative.    Physical Exam Triage Vital Signs ED Triage Vitals  Encounter Vitals Group     BP 02/01/24 1629 (!) 144/82     Systolic BP Percentile --      Diastolic BP Percentile --      Pulse Rate 02/01/24 1629 (!) 58     Resp 02/01/24 1629 16      Temp 02/01/24 1629 97.7 F (36.5 C)     Temp Source 02/01/24 1629 Oral     SpO2 02/01/24 1629 95 %     Weight --      Height --      Head Circumference --      Peak Flow --      Pain Score 02/01/24 1628 5     Pain Loc --      Pain Education --      Exclude from Growth Chart --    No data found.  Updated Vital Signs BP (!) 144/82 (BP Location: Left Arm)   Pulse (!) 58   Temp 97.7 F (36.5 C) (Oral)   Resp 16   SpO2 95%   Visual Acuity Right Eye Distance:   Left Eye Distance:   Bilateral Distance:    Right Eye Near:   Left Eye Near:    Bilateral Near:     Physical Exam Vitals and nursing note reviewed.  Constitutional:      Appearance: She is well-developed.  HENT:     Head: Normocephalic.     Comments: No sign of bruising or swelling to face or head.    Right Ear: External ear normal.     Left Ear: Tympanic membrane and external ear normal.  Neck:     Comments: Cervical spine diffusely tender no point tenderness full range of motion Cardiovascular:     Rate and Rhythm: Normal rate.  Pulmonary:     Effort: Pulmonary effort is normal.  Abdominal:     General: There is no distension.  Musculoskeletal:        General: Normal range of motion.     Cervical back: Normal range of motion.  Skin:    General: Skin is warm.  Neurological:     General: No focal deficit present.     Mental Status: She  is alert and oriented to person, place, and time.      UC Treatments / Results  Labs (all labs ordered are listed, but only abnormal results are displayed) Labs Reviewed - No data to display  EKG   Radiology No results found.  Procedures Procedures (including critical care time)  Medications Ordered in UC Medications - No data to display  Initial Impression / Assessment and Plan / UC Course  I have reviewed the triage vital signs and the nursing notes.  Pertinent labs & imaging results that were available during my care of the patient were reviewed by me  and considered in my medical decision making (see chart for details).     Patient states that she just wanted to make sure that she got checked.  I advised patient Tylenol for headache rest no strenuous activity for the next week.  I do not think patient has any type of intercranial injury. Final Clinical Impressions(s) / UC Diagnoses   Final diagnoses:  Contusion of forehead, initial encounter   Discharge Instructions   None    ED Prescriptions   None    PDMP not reviewed this encounter. An After Visit Summary was printed and given to the patient.       Elson Areas, [place] 02/01/24 1646

## 2024-02-01 NOTE — ED Triage Notes (Signed)
 Patient reports that she was in a Game Room 3 days ago and a stand up computer fell forward and hit her in the forehead. Patient c/o headache and right shoulder pain.  Patient states she had blurred vision when the incident occurred, but none now.

## 2024-02-18 ENCOUNTER — Other Ambulatory Visit: Payer: Self-pay | Admitting: Nurse Practitioner

## 2024-02-18 DIAGNOSIS — R7303 Prediabetes: Secondary | ICD-10-CM

## 2024-03-15 ENCOUNTER — Other Ambulatory Visit: Payer: Self-pay

## 2024-03-15 DIAGNOSIS — E1122 Type 2 diabetes mellitus with diabetic chronic kidney disease: Secondary | ICD-10-CM

## 2024-03-15 DIAGNOSIS — Z794 Long term (current) use of insulin: Secondary | ICD-10-CM

## 2024-03-15 DIAGNOSIS — I739 Peripheral vascular disease, unspecified: Secondary | ICD-10-CM

## 2024-03-15 DIAGNOSIS — E039 Hypothyroidism, unspecified: Secondary | ICD-10-CM

## 2024-03-15 MED ORDER — SEMAGLUTIDE(0.25 OR 0.5MG/DOS) 2 MG/3ML ~~LOC~~ SOPN: 0.5000 mg | PEN_INJECTOR | SUBCUTANEOUS | 1 refills | Status: DC

## 2024-03-15 MED ORDER — REPATHA SURECLICK 140 MG/ML ~~LOC~~ SOAJ: 140.0000 mg | SUBCUTANEOUS | 5 refills | Status: DC

## 2024-03-15 MED ORDER — LEVOTHYROXINE SODIUM 75 MCG PO TABS: 75.0000 ug | ORAL_TABLET | Freq: Every day | ORAL | 1 refills | Status: AC

## 2024-04-20 ENCOUNTER — Ambulatory Visit (INDEPENDENT_AMBULATORY_CARE_PROVIDER_SITE_OTHER): Payer: 59 | Admitting: Nurse Practitioner

## 2024-04-20 ENCOUNTER — Encounter: Payer: Self-pay | Admitting: Nurse Practitioner

## 2024-04-20 VITALS — BP 120/64 | HR 57 | Temp 98.9°F | Ht 67.0 in | Wt 205.0 lb

## 2024-04-20 DIAGNOSIS — N1831 Chronic kidney disease, stage 3a: Secondary | ICD-10-CM | POA: Diagnosis not present

## 2024-04-20 DIAGNOSIS — N183 Chronic kidney disease, stage 3 unspecified: Secondary | ICD-10-CM | POA: Diagnosis not present

## 2024-04-20 DIAGNOSIS — R21 Rash and other nonspecific skin eruption: Secondary | ICD-10-CM

## 2024-04-20 DIAGNOSIS — E66811 Obesity, class 1: Secondary | ICD-10-CM

## 2024-04-20 DIAGNOSIS — E1122 Type 2 diabetes mellitus with diabetic chronic kidney disease: Secondary | ICD-10-CM

## 2024-04-20 DIAGNOSIS — E782 Mixed hyperlipidemia: Secondary | ICD-10-CM | POA: Diagnosis not present

## 2024-04-20 DIAGNOSIS — Z0000 Encounter for general adult medical examination without abnormal findings: Secondary | ICD-10-CM

## 2024-04-20 DIAGNOSIS — Z794 Long term (current) use of insulin: Secondary | ICD-10-CM | POA: Diagnosis not present

## 2024-04-20 DIAGNOSIS — Z23 Encounter for immunization: Secondary | ICD-10-CM

## 2024-04-20 DIAGNOSIS — M25551 Pain in right hip: Secondary | ICD-10-CM

## 2024-04-20 DIAGNOSIS — I10 Essential (primary) hypertension: Secondary | ICD-10-CM

## 2024-04-20 DIAGNOSIS — Z6832 Body mass index (BMI) 32.0-32.9, adult: Secondary | ICD-10-CM

## 2024-04-20 DIAGNOSIS — I129 Hypertensive chronic kidney disease with stage 1 through stage 4 chronic kidney disease, or unspecified chronic kidney disease: Secondary | ICD-10-CM

## 2024-04-20 DIAGNOSIS — E039 Hypothyroidism, unspecified: Secondary | ICD-10-CM

## 2024-04-20 DIAGNOSIS — E6609 Other obesity due to excess calories: Secondary | ICD-10-CM

## 2024-04-20 MED ORDER — TRIAMCINOLONE ACETONIDE 0.5 % EX CREA: 1.0000 | TOPICAL_CREAM | Freq: Two times a day (BID) | CUTANEOUS | 2 refills | Status: DC

## 2024-04-20 MED ORDER — TRIAMCINOLONE ACETONIDE 40 MG/ML IJ SUSP
40.0000 mg | Freq: Once | INTRAMUSCULAR | Status: AC
  Administered 2024-04-20: 40 mg via INTRAMUSCULAR

## 2024-04-20 NOTE — Progress Notes (Addendum)
 I,Jameka J Llittleton, CMA,acting as a Neurosurgeon for [employer], FNP.,have documented all relevant documentation on the behalf of Margaret Ada, FNP,as directed by  Margaret Ada, FNP while in the presence of Margaret Ada, FNP.  Subjective:    Patient ID: Margaret Webster , female    DOB: November 05, 1960 , 63 y.o.   MRN: [id number]  Chief Complaint  Patient presents with   Annual Exam    Patient presents today for her physical. Patient denies having chest pain,sob or headaches at this time. Patient reports her thyroid  medication is making her feel bad.     HPI  [name] is a postmenopausal woman presenting for her annual physical exam. She has lost 11 pounds since January, with her current weight at 205 lbs. The patient reports hip and back pain, requesting a cortisone shot. She also mentions that her thyroid  medication may be making her feel unwell. [name] has hypothyroidism requiring ongoing medication. She reports soreness in her breast area and issues with her ankles and feet, describing a sensation of 'walking on bubbles'. The patient is due for pneumonia and COVID vaccines, as well as her second shingles vaccine, which was previously delayed due to COVID and kidney issues.     Past Medical History:  Diagnosis Date   Acute pain of right shoulder 09/19/2019   Arthritis    bil shoulders   Bronchitis    Chronic back pain    Chronic kidney disease    Diabetes mellitus    stopped Mrtformin 1 mo ago, made her sick   Fatty liver    Hypertension    Hypothyroidism    Pain 04/07/2014   Stroke (HCC)    either 2019/2020   Thyroid  disease    Trigger thumb of right hand      Family History  Problem Relation Age of Onset   Diabetes Mother    Cystic fibrosis Sister    Cancer Sister        ?   Breast cancer Maternal Aunt    Heart disease Maternal Grandmother    Clotting disorder Maternal Grandfather      Current Outpatient Medications:    acetaminophen  (TYLENOL ) 500 MG tablet, Take 1 tablet  (500 mg total) by mouth every 6 (six) hours as needed., Disp: 30 tablet, Rfl: 0   amLODipine  (NORVASC ) 2.5 MG tablet, TAKE 1 TABLET BY MOUTH ONCE  DAILY, Disp: 100 tablet, Rfl: 2   aspirin  EC 81 MG tablet, Take 81 mg by mouth in the morning., Disp: , Rfl:    Bempedoic Acid  (NEXLETOL ) 180 MG TABS, Take 1 tablet (180 mg total) by mouth at bedtime., Disp: 90 tablet, Rfl: 2   cetirizine  (ZYRTEC  ALLERGY) 10 MG tablet, Take 1 tablet (10 mg total) by mouth daily., Disp: 30 tablet, Rfl: 2   cyclobenzaprine  (FLEXERIL ) 10 MG tablet, Take 1 tablet (10 mg total) by mouth 2 (two) times daily as needed for muscle spasms., Disp: 20 tablet, Rfl: 0   diclofenac  Sodium (VOLTAREN ) 1 % GEL, Apply topically 4 (four) times daily as needed (pain.)., Disp: , Rfl:    DULoxetine (CYMBALTA) 30 MG capsule, Take 60 mg by mouth daily at 2 PM., Disp: , Rfl:    Evolocumab  (REPATHA  SURECLICK) 140 MG/ML SOAJ, Inject 140 mg into the skin every 14 (fourteen) days., Disp: 2 mL, Rfl: 5   ezetimibe  (ZETIA ) 10 MG tablet, Take 1 tablet (10 mg total) by mouth daily., Disp: 90 tablet, Rfl: 2   levothyroxine  (SYNTHROID ) 75 MCG tablet,  Take 1 tablet (75 mcg total) by mouth daily before breakfast., Disp: 90 tablet, Rfl: 1   lidocaine  (LIDODERM ) 5 %, Place 1 patch onto the skin daily. Remove & Discard patch within 12 hours or as directed by MD, Disp: 30 patch, Rfl: 0   losartan  (COZAAR ) 50 MG tablet, Take 1 tablet (50 mg total) by mouth in the morning., Disp: 90 tablet, Rfl: 1   metFORMIN  (GLUCOPHAGE -XR) 500 MG 24 hr tablet, TAKE 1 TABLET BY MOUTH ONCE  DAILY WITH BREAKFAST, Disp: 100 tablet, Rfl: 2   Semaglutide ,0.25 or 0.5MG /DOS, 2 MG/3ML SOPN, Inject 0.5 mg into the skin once a week., Disp: 9 mL, Rfl: 1   Vitamin D , Ergocalciferol , (DRISDOL ) 1.25 MG (50000 UNIT) CAPS capsule, Take 1 capsule (50,000 Units total) by mouth every Monday., Disp: 12 capsule, Rfl: 1   triamcinolone  cream (KENALOG ) 0.5 %, Apply 1 Application topically 2 (two) times  daily., Disp: 60 g, Rfl: 2   Allergies  Allergen Reactions   Sulfa Antibiotics Other (See Comments)    Hard to breathe   Amoxil  [Amoxicillin ]    Ampicillin Other (See Comments) and Hypertension    Bloody stools    Crestor  [Rosuvastatin ] Other (See Comments)    Makes her mouth numb and causes side pain   Ibuprofen  Other (See Comments)    Stomach upset   Polytrim  [Polymyxin B -Trimethoprim ] Itching and Swelling    redness   Influenza Vaccines Hives   Lipitor [Atorvastatin Calcium ] Other (See Comments)    Muscle ache    Naproxen Nausea And Vomiting      The patient states she uses post menopausal status for birth control. No LMP recorded. Patient is postmenopausal.   Negative for Dysmenorrhea and Negative for Menorrhagia. Negative for: breast discharge, breast lump(s), breast pain and breast self exam. Associated symptoms include abnormal vaginal bleeding. Pertinent negatives include abnormal bleeding (hematology), anxiety, decreased libido, depression, difficulty falling sleep, dyspareunia, history of infertility, nocturia, sexual dysfunction, sleep disturbances, urinary incontinence, urinary urgency, vaginal discharge and vaginal itching. Diet regular; healthier options The patient states her exercise level is minimal - she gets her exercise with cleaning.  She does admit she needs to get out and walk due to her right knee, ankle and foot.   The patient's tobacco use is:  Social History   Tobacco Use  Smoking Status Every Day   Current packs/day: 0.25   Average packs/day: 0.3 packs/day for 45.0 years (11.3 ttl pk-yrs)   Types: Cigarettes  Smokeless Tobacco Never  Tobacco Comments   4 cig a day, not interested in chantix , 11/27/22 - she is smoking one cigarette a day; 11/24/23 cutting back on her smoking   She has been exposed to passive smoke. The patient's alcohol use is:  Social History   Substance and Sexual Activity  Alcohol Use Yes   Comment: occasional   Additional  information: Last pap 12/03/2021, next one scheduled for 12/03/2024.    Review of Systems  Constitutional: Negative.   HENT: Negative.    Eyes: Negative.   Respiratory: Negative.    Cardiovascular: Negative.   Gastrointestinal: Negative.   Endocrine: Negative.   Genitourinary: Negative.   Musculoskeletal: Negative.   Skin: Negative.   Allergic/Immunologic: Negative.   Neurological: Negative.   Hematological: Negative.   Psychiatric/Behavioral: Negative.       Today's Vitals   04/20/24 0933  BP: 120/64  Pulse: (!) 57  Temp: 98.9 F (37.2 C)  TempSrc: Oral  Weight: 205 lb (93 kg)  Height:  5' 7 (1.702 m)  PainSc: 0-No pain   Body mass index is 32.11 kg/m.  Wt Readings from Last 3 Encounters:  04/20/24 205 lb (93 kg)  11/24/23 216 lb 6.4 oz (98.2 kg)  10/04/23 216 lb (98 kg)     Objective:  Physical Exam Vitals and nursing note reviewed.  Constitutional:      General: She is not in acute distress.    Appearance: Normal appearance. She is well-developed. She is obese.  HENT:     Head: Normocephalic and atraumatic.     Right Ear: Hearing, tympanic membrane, ear canal and external ear normal. There is no impacted cerumen.     Left Ear: Hearing, tympanic membrane, ear canal and external ear normal. There is no impacted cerumen.     Nose: Nose normal.     Mouth/Throat:     Mouth: Mucous membranes are moist.  Eyes:     General: Lids are normal.     Extraocular Movements: Extraocular movements intact.     Conjunctiva/sclera: Conjunctivae normal.     Pupils: Pupils are equal, round, and reactive to light.     Funduscopic exam:    Right eye: No papilledema.        Left eye: No papilledema.  Neck:     Thyroid : No thyroid  mass.     Vascular: No carotid bruit.  Cardiovascular:     Rate and Rhythm: Normal rate and regular rhythm.     Pulses: Normal pulses.     Heart sounds: Normal heart sounds. No murmur heard. Pulmonary:     Effort: Pulmonary effort is normal. No  respiratory distress.     Breath sounds: Normal breath sounds. No wheezing.  Chest:     Chest wall: No mass.  Breasts:    Tanner Score is 5.     Right: Normal. No mass or tenderness.     Left: Normal. No mass or tenderness.  Abdominal:     General: Abdomen is flat. Bowel sounds are normal. There is no distension.     Palpations: Abdomen is soft.     Tenderness: There is no abdominal tenderness.  Genitourinary:    Rectum: Guaiac result negative.  Musculoskeletal:        General: No swelling or tenderness.     Right shoulder: Normal range of motion.     Left shoulder: Normal range of motion.     Cervical back: Full passive range of motion without pain, normal range of motion and neck supple. No rigidity.     Right lower leg: No edema.     Left lower leg: No edema.  Lymphadenopathy:     Upper Body:     Right upper body: No supraclavicular, axillary or pectoral adenopathy.     Left upper body: No supraclavicular, axillary or pectoral adenopathy.  Skin:    General: Skin is warm and dry.     Capillary Refill: Capillary refill takes less than 2 seconds.  Neurological:     General: No focal deficit present.     Mental Status: She is alert and oriented to person, place, and time.     Cranial Nerves: No cranial nerve deficit.     Sensory: No sensory deficit.  Psychiatric:        Mood and Affect: Mood normal.        Behavior: Behavior normal.        Thought Content: Thought content normal.        Judgment: Judgment normal.  Diabetic foot exam was performed with the following findings:   No deformities, ulcerations, or other skin breakdown Normal sensation of 10g monofilament Intact posterior tibialis and dorsalis pedis pulses     Assessment And Plan:     Benign hypertension Assessment & Plan: Blood pressure is well controlled, continue current medications  Orders: -     CBC -     CMP14+EGFR -     EKG 12-Lead  Encounter for general adult medical examination w/o  abnormal findings Assessment & Plan: Behavior modifications discussed and diet history reviewed.   Pt will continue to exercise regularly and modify diet with low GI, plant based foods and decrease intake of processed foods.  Recommend intake of daily multivitamin, Vitamin D , and calcium .  Recommend mammogram and colonoscopy for preventive screenings, as well as recommend immunizations that include influenza, TDAP, and Shingles    Acquired hypothyroidism Assessment & Plan: Will check thyroid  levels.   Orders: -     TSH + free T4  Type 2 diabetes mellitus with stage 3a chronic kidney disease, with long-term current use of insulin  (HCC) Assessment & Plan: HgbA1c is stable. Continue current medications. Will check HgbA1c.   Orders: -     Hemoglobin A1c -     Triamcinolone  Acetonide  Mixed hyperlipidemia Assessment & Plan: Continue Repatha  and f/u with Cardiology  Orders: -     Lipid panel  Rash and nonspecific skin eruption -     Triamcinolone  Acetonide; Apply 1 Application topically 2 (two) times daily.  Dispense: 60 g; Refill: 2  Right hip pain Assessment & Plan: Tenderness to right hip.    Need for pneumococcal 20-valent conjugate vaccination Assessment & Plan: Pneumonia 20 given in office.  Orders: -     Pneumococcal conjugate vaccine 20-valent  Class 1 obesity due to excess calories with body mass index (BMI) of 32.0 to 32.9 in adult, unspecified whether serious comorbidity present Assessment & Plan: She is encouraged to strive for BMI less than 30 to decrease cardiac risk. Advised to aim for at least 150 minutes of exercise per week.     Return for 1 year physical; 4 month DM f/u; schedule shingrix  #2 in 2 weeks or more.  Patient was given opportunity to ask questions. Patient verbalized understanding of the plan and was able to repeat key elements of the plan. All questions were answered to their satisfaction.   Margaret Ada, FNP  I, Margaret Ada, FNP,  have reviewed all documentation for this visit. The documentation on 04/20/24 for the exam, diagnosis, procedures, and orders are all accurate and complete.

## 2024-04-20 NOTE — Patient Instructions (Signed)
 Health Maintenance  Topic Date Due   Pneumococcal Vaccine for age over 30 (1 of 2 - PCV) Never done   Zoster (Shingles) Vaccine (2 of 2) 07/03/2022   COVID-19 Vaccine (4 - 2024-25 season) 06/28/2023   Medicare Annual Wellness Visit  06/16/2024   Hemoglobin A1C  05/23/2024   Flu Shot  05/27/2024   Mammogram  07/09/2024   Eye exam for diabetics  08/03/2024   Yearly kidney function blood test for diabetes  11/23/2024   Yearly kidney health urinalysis for diabetes  11/23/2024   Complete foot exam   04/20/2025   Pap with HPV screening  12/03/2026   Cologuard (Stool DNA test)  12/22/2026   DTaP/Tdap/Td vaccine (5 - Td or Tdap) 11/27/2032   DEXA scan (bone density measurement)  Completed   Hepatitis C Screening  Completed   HIV Screening  Completed   Hepatitis B Vaccine  Aged Out   HPV Vaccine  Aged Out   Meningitis B Vaccine  Aged Out   Colon Cancer Screening  Discontinued

## 2024-04-21 LAB — TSH+FREE T4
Free T4: 1.12 ng/dL (ref 0.82–1.77)
TSH: 7.96 u[IU]/mL — ABNORMAL HIGH (ref 0.450–4.500)

## 2024-04-21 LAB — CMP14+EGFR
ALT: 16 IU/L (ref 0–32)
AST: 22 IU/L (ref 0–40)
Albumin: 4.3 g/dL (ref 3.9–4.9)
Alkaline Phosphatase: 102 IU/L (ref 44–121)
BUN/Creatinine Ratio: 16 (ref 12–28)
BUN: 16 mg/dL (ref 8–27)
Bilirubin Total: 0.3 mg/dL (ref 0.0–1.2)
CO2: 21 mmol/L (ref 20–29)
Calcium: 10.1 mg/dL (ref 8.7–10.3)
Chloride: 103 mmol/L (ref 96–106)
Creatinine, Ser: 1.03 mg/dL — ABNORMAL HIGH (ref 0.57–1.00)
Globulin, Total: 3 g/dL (ref 1.5–4.5)
Glucose: 88 mg/dL (ref 70–99)
Potassium: 4.8 mmol/L (ref 3.5–5.2)
Sodium: 138 mmol/L (ref 134–144)
Total Protein: 7.3 g/dL (ref 6.0–8.5)
eGFR: 60 mL/min/{1.73_m2} (ref >59)

## 2024-04-21 LAB — CBC
Hematocrit: 45.5 % (ref 34.0–46.6)
Hemoglobin: 15.3 g/dL (ref 11.1–15.9)
MCH: 31.6 pg (ref 26.6–33.0)
MCHC: 33.6 g/dL (ref 31.5–35.7)
MCV: 94 fL (ref 79–97)
Platelets: 273 10*3/uL (ref 150–450)
RBC: 4.84 x10E6/uL (ref 3.77–5.28)
RDW: 12.5 % (ref 11.7–15.4)
WBC: 5.1 10*3/uL (ref 3.4–10.8)

## 2024-04-21 LAB — LIPID PANEL
Chol/HDL Ratio: 3.9 ratio (ref 0.0–4.4)
Cholesterol, Total: 181 mg/dL (ref 100–199)
HDL: 46 mg/dL (ref >39)
LDL Chol Calc (NIH): 95 mg/dL (ref 0–99)
Triglycerides: 239 mg/dL — ABNORMAL HIGH (ref 0–149)
VLDL Cholesterol Cal: 40 mg/dL (ref 5–40)

## 2024-04-21 LAB — HEMOGLOBIN A1C
Est. average glucose Bld gHb Est-mCnc: 120 mg/dL
Hgb A1c MFr Bld: 5.8 % — ABNORMAL HIGH (ref 4.8–5.6)

## 2024-04-25 ENCOUNTER — Ambulatory Visit: Payer: Self-pay | Admitting: Nurse Practitioner

## 2024-04-25 DIAGNOSIS — Z0000 Encounter for general adult medical examination without abnormal findings: Secondary | ICD-10-CM | POA: Insufficient documentation

## 2024-04-25 DIAGNOSIS — Z23 Encounter for immunization: Secondary | ICD-10-CM | POA: Insufficient documentation

## 2024-04-25 DIAGNOSIS — Z6832 Body mass index (BMI) 32.0-32.9, adult: Secondary | ICD-10-CM | POA: Insufficient documentation

## 2024-04-25 NOTE — Assessment & Plan Note (Signed)
 HgbA1c is stable. Continue current medications. Will check HgbA1c.

## 2024-04-25 NOTE — Assessment & Plan Note (Signed)
 Blood pressure is well controlled, continue current medications.

## 2024-04-25 NOTE — Assessment & Plan Note (Signed)
Continue Repatha and f/u with Cardiology

## 2024-04-25 NOTE — Assessment & Plan Note (Signed)

## 2024-04-25 NOTE — Assessment & Plan Note (Signed)
 She is encouraged to strive for BMI less than 30 to decrease cardiac risk. Advised to aim for at least 150 minutes of exercise per week.

## 2024-04-25 NOTE — Assessment & Plan Note (Signed)
Will check thyroid levels

## 2024-04-25 NOTE — Assessment & Plan Note (Signed)
 Tenderness to right hip.

## 2024-04-25 NOTE — Assessment & Plan Note (Signed)
 Pneumonia 20 given in office.

## 2024-05-02 DIAGNOSIS — I480 Paroxysmal atrial fibrillation: Secondary | ICD-10-CM | POA: Diagnosis not present

## 2024-05-02 DIAGNOSIS — E785 Hyperlipidemia, unspecified: Secondary | ICD-10-CM | POA: Diagnosis not present

## 2024-05-02 DIAGNOSIS — I71019 Dissection of thoracic aorta, unspecified: Secondary | ICD-10-CM | POA: Diagnosis not present

## 2024-05-02 DIAGNOSIS — I10 Essential (primary) hypertension: Secondary | ICD-10-CM | POA: Diagnosis not present

## 2024-05-02 NOTE — Progress Notes (Signed)
 updated

## 2024-05-03 ENCOUNTER — Ambulatory Visit

## 2024-05-03 DIAGNOSIS — S92344D Nondisplaced fracture of fourth metatarsal bone, right foot, subsequent encounter for fracture with routine healing: Secondary | ICD-10-CM | POA: Diagnosis not present

## 2024-05-03 DIAGNOSIS — S92351D Displaced fracture of fifth metatarsal bone, right foot, subsequent encounter for fracture with routine healing: Secondary | ICD-10-CM | POA: Diagnosis not present

## 2024-05-23 DIAGNOSIS — N3090 Cystitis, unspecified without hematuria: Secondary | ICD-10-CM | POA: Diagnosis not present

## 2024-06-14 ENCOUNTER — Encounter (HOSPITAL_COMMUNITY): Payer: Self-pay | Admitting: *Deleted

## 2024-06-14 ENCOUNTER — Ambulatory Visit (HOSPITAL_COMMUNITY)
Admission: EM | Admit: 2024-06-14 | Discharge: 2024-06-14 | Disposition: A | Discharge status: Home / Self Care | Attending: Family Medicine | Admitting: Family Medicine

## 2024-06-14 DIAGNOSIS — J40 Bronchitis, not specified as acute or chronic: Secondary | ICD-10-CM

## 2024-06-14 DIAGNOSIS — J0100 Acute maxillary sinusitis, unspecified: Secondary | ICD-10-CM

## 2024-06-14 MED ORDER — BENZONATATE 200 MG PO CAPS: 200.0000 mg | ORAL_CAPSULE | Freq: Three times a day (TID) | ORAL | 0 refills | Status: DC | PRN

## 2024-06-14 MED ORDER — ALBUTEROL SULFATE HFA 108 (90 BASE) MCG/ACT IN AERS: 2.0000 | INHALATION_SPRAY | RESPIRATORY_TRACT | 0 refills | Status: AC | PRN

## 2024-06-14 MED ORDER — DOXYCYCLINE HYCLATE 100 MG PO CAPS: 100.0000 mg | ORAL_CAPSULE | Freq: Two times a day (BID) | ORAL | 0 refills | Status: DC

## 2024-06-14 NOTE — ED Provider Notes (Signed)
 [facility]    CSN: [id number] Arrival date & time: 06/14/24  9197      History   Chief Complaint Chief Complaint  Patient presents with   Cough   Headache    HPI Margaret Webster is a 63 y.o. female.    Cough Associated symptoms: chills, headaches, rhinorrhea and wheezing   Associated symptoms: no fever   Headache Associated symptoms: congestion, cough, fatigue and sinus pressure   Associated symptoms: no fever    Patient is here for URI symptoms.  Started about 9 days ago with cough, congestion, headache, body aches.  It has been worse the last several days  She states she was wheezing so bad last night but kept her up most of the night.  Some chills, no fevers, but having sweats.  Also with sinus pain/pressure.  She does have h/o bronchitis.  She does not have any inhalers at home.         Past Medical History:  Diagnosis Date   Acute pain of right shoulder 09/19/2019   Arthritis    bil shoulders   Bronchitis    Chronic back pain    Chronic kidney disease    Diabetes mellitus    stopped Mrtformin 1 mo ago, made her sick   Fatty liver    Hypertension    Hypothyroidism    Pain 04/07/2014   Stroke (HCC)    either 2019/2020   Thyroid  disease    Trigger thumb of right hand     Patient Active Problem List   Diagnosis Date Noted   Encounter for general adult medical examination w/o abnormal findings 04/25/2024   Need for pneumococcal 20-valent conjugate vaccination 04/25/2024   Class 1 obesity due to excess calories with body mass index (BMI) of 32.0 to 32.9 in adult 04/25/2024   COVID-19 vaccination declined 11/29/2023   Influenza vaccination declined 11/29/2023   Class 1 obesity due to excess calories with body mass index (BMI) of 33.0 to 33.9 in adult 11/29/2023   Type 2 diabetes mellitus with stage 3a chronic kidney disease (HCC) 11/24/2023   Sleep apnea with use of continuous positive airway pressure (CPAP) 08/20/2023   Statin  myopathy 08/20/2023   Need for zoster vaccination 07/30/2023   Fatigue 07/30/2023   Rash and nonspecific skin eruption 05/21/2023   Post-nasal drainage 05/21/2023   Obesity (BMI 35.0-39.9 without comorbidity) 05/21/2023   Herpes zoster vaccination declined 05/21/2023   Vitamin D  deficiency 05/21/2023   Mixed hyperlipidemia 05/21/2023   PAD (peripheral artery disease) (HCC) 11/27/2022   Depression 07/05/2020   Ringing in ears 07/05/2020   CVA (cerebral vascular accident) (HCC) 02/26/2020   Bradycardia    Elevated TSH    Multiple joint pain 11/07/2019   Right hip pain 09/19/2019   Mass of joint of finger 07/26/2015   Hypothyroidism 01/10/2015   Benign hypertension with CKD (chronic kidney disease) stage III (HCC) 01/10/2015   Boil of buttock 04/06/2014   Female genital symptoms 11/16/2013   Candidiasis of vulva and vagina 11/16/2013   Pain aggravated by activities of daily living 06/07/2013   S/P cervical spinal fusion 01/17/2013   Cervical radiculopathy 12/13/2012   Carpal tunnel syndrome on both sides 12/06/2012   Cervical spondylosis with myelopathy 12/06/2012   Nonspecific abnormal finding in stool contents 12/02/2011   Chronic back pain     Past Surgical History:  Procedure Laterality Date   BACK SURGERY  1993   CARPAL TUNNEL RELEASE  08/2015   CARPAL  TUNNEL RELEASE Left 09/10/2017   Procedure: CARPAL TUNNEL RELEASE;  Surgeon: Murrell Drivers, MD;  Location: [facility];  Service: Orthopedics;  Laterality: Left;   CERVICAL SPINE SURGERY Right 11/2012   ESOPHAGOGASTRODUODENOSCOPY (EGD) WITH PROPOFOL  N/A 08/01/2022   Procedure: ESOPHAGOGASTRODUODENOSCOPY (EGD) WITH PROPOFOL ;  Surgeon: Rollin Dover, MD;  Location: [facility];  Service: Gastroenterology;  Laterality: N/A;   OOPHORECTOMY     TRIGGER FINGER RELEASE Right 08/06/2017   Procedure: RIGHT THUMB TRIGGER RELEASE;  Surgeon: Murrell Drivers, MD;  Location: [facility];  Service: Orthopedics;   Laterality: Right;   ULNAR COLLATERAL LIGAMENT REPAIR Right 03/21/2021   Procedure: RIGHT THUMB ULNAR COLLATERAL LIGAMENT REPAIR;  Surgeon: Murrell Drivers, MD;  Location: [facility];  Service: Orthopedics;  Laterality: Right;   UPPER ESOPHAGEAL ENDOSCOPIC ULTRASOUND (EUS) N/A 08/01/2022   Procedure: UPPER ESOPHAGEAL ENDOSCOPIC ULTRASOUND (EUS);  Surgeon: Rollin Dover, MD;  Location: [facility];  Service: Gastroenterology;  Laterality: N/A;    OB History     Gravida  1   Para  0   Term  0   Preterm  0   AB  0   Living  1      SAB  0   IAB  0   Ectopic  0   Multiple  0   Live Births  1            Home Medications    Prior to Admission medications   Medication Sig Start Date End Date Taking? Authorizing Provider  amLODipine  (NORVASC ) 2.5 MG tablet TAKE 1 TABLET BY MOUTH ONCE  DAILY 02/19/24  Yes Georgina Speaks, FNP  aspirin  EC 81 MG tablet Take 81 mg by mouth in the morning.   Yes [provider]  Bempedoic Acid  (NEXLETOL ) 180 MG TABS Take 1 tablet (180 mg total) by mouth at bedtime. 04/17/23  Yes Hilty, Vinie BROCKS, MD  DULoxetine (CYMBALTA) 30 MG capsule Take 60 mg by mouth daily at 2 PM. 07/06/22  Yes [provider]  Evolocumab  (REPATHA  SURECLICK) 140 MG/ML SOAJ Inject 140 mg into the skin every 14 (fourteen) days. 03/15/24  Yes Georgina Speaks, FNP  ezetimibe  (ZETIA ) 10 MG tablet Take 1 tablet (10 mg total) by mouth daily. 04/17/23  Yes Hilty, Vinie BROCKS, MD  FARXIGA 5 MG TABS tablet Take 5 mg by mouth daily. 01/27/24  Yes [provider]  levothyroxine  (SYNTHROID ) 75 MCG tablet Take 1 tablet (75 mcg total) by mouth daily before breakfast. 03/15/24  Yes Georgina Speaks, FNP  losartan  (COZAAR ) 50 MG tablet Take 1 tablet (50 mg total) by mouth in the morning. 11/24/23  Yes Georgina Speaks, FNP  metFORMIN  (GLUCOPHAGE -XR) 500 MG 24 hr tablet TAKE 1 TABLET BY MOUTH ONCE  DAILY WITH BREAKFAST 02/19/24  Yes Georgina Speaks, FNP  Semaglutide ,0.25 or  0.5MG /DOS, 2 MG/3ML SOPN Inject 0.5 mg into the skin once a week. 03/15/24  Yes Georgina Speaks, FNP  Vitamin D , Ergocalciferol , (DRISDOL ) 1.25 MG (50000 UNIT) CAPS capsule Take 1 capsule (50,000 Units total) by mouth every Monday. 06/01/23  Yes Georgina Speaks, FNP  acetaminophen  (TYLENOL ) 500 MG tablet Take 1 tablet (500 mg total) by mouth every 6 (six) hours as needed. 11/08/22   Rolinda Rogue, MD  cetirizine  (ZYRTEC  ALLERGY) 10 MG tablet Take 1 tablet (10 mg total) by mouth daily. 05/21/23 05/20/24  [name], Janece, FNP  cyclobenzaprine  (FLEXERIL ) 10 MG tablet Take 1 tablet (10 mg total) by mouth 2 (two) times daily as needed  for muscle spasms. 10/04/23   Dreama Longs, MD  diclofenac  Sodium (VOLTAREN ) 1 % GEL Apply topically 4 (four) times daily as needed (pain.).    [provider]  lidocaine  (LIDODERM ) 5 % Place 1 patch onto the skin daily. Remove & Discard patch within 12 hours or as directed by MD 10/04/23   Dreama Longs, MD  triamcinolone  cream (KENALOG ) 0.5 % Apply 1 Application topically 2 (two) times daily. 04/20/24   Georgina Speaks, FNP    Family History Family History  Problem Relation Age of Onset   Diabetes Mother    Cystic fibrosis Sister    Cancer Sister        ?   Breast cancer Maternal Aunt    Heart disease Maternal Grandmother    Clotting disorder Maternal Grandfather     Social History Social History   Tobacco Use   Smoking status: Every Day    Current packs/day: 0.25    Average packs/day: 0.3 packs/day for 45.0 years (11.3 ttl pk-yrs)    Types: Cigarettes   Smokeless tobacco: Never   Tobacco comments:    4 cig a day, not interested in chantix , 11/27/22 - she is smoking one cigarette a day; 11/24/23 cutting back on her smoking  Vaping Use   Vaping status: Never Used  Substance Use Topics   Alcohol use: Yes    Comment: occasional   Drug use: Yes    Types: Marijuana     Allergies   Sulfa antibiotics, Amoxil  [amoxicillin ], Ampicillin, Crestor   [rosuvastatin ], Ibuprofen , Polytrim  [polymyxin b -trimethoprim ], Influenza vaccines, Lipitor [atorvastatin calcium ], and Naproxen   Review of Systems Review of Systems  Constitutional:  Positive for chills and fatigue. Negative for fever.  HENT:  Positive for congestion, rhinorrhea and sinus pressure.   Respiratory:  Positive for cough and wheezing.   Cardiovascular: Negative.   Gastrointestinal: Negative.   Musculoskeletal: Negative.   Neurological:  Positive for headaches.  Psychiatric/Behavioral: Negative.       Physical Exam Triage Vital Signs ED Triage Vitals  Encounter Vitals Group     BP 06/14/24 0825 137/85     Girls Systolic BP Percentile --      Girls Diastolic BP Percentile --      Boys Systolic BP Percentile --      Boys Diastolic BP Percentile --      Pulse Rate 06/14/24 0825 62     Resp 06/14/24 0825 20     Temp 06/14/24 0825 98.3 F (36.8 C)     Temp Source 06/14/24 0825 Oral     SpO2 06/14/24 0825 97 %     Weight --      Height --      Head Circumference --      Peak Flow --      Pain Score 06/14/24 0823 4     Pain Loc --      Pain Education --      Exclude from Growth Chart --    No data found.  Updated Vital Signs BP 137/85 (BP Location: Left Arm)   Pulse 62   Temp 98.3 F (36.8 C) (Oral)   Resp 20   SpO2 97%   Visual Acuity Right Eye Distance:   Left Eye Distance:   Bilateral Distance:    Right Eye Near:   Left Eye Near:    Bilateral Near:     Physical Exam Constitutional:      General: She is not in acute distress.    Appearance:  She is well-developed and normal weight. She is not ill-appearing or toxic-appearing.  HENT:     Nose: Congestion and rhinorrhea present.     Mouth/Throat:     Mouth: Mucous membranes are moist.  Eyes:     Extraocular Movements: Extraocular movements intact.  Cardiovascular:     Rate and Rhythm: Normal rate and regular rhythm.  Pulmonary:     Effort: Pulmonary effort is normal.     Breath sounds:  Normal breath sounds. No wheezing or rhonchi.  Musculoskeletal:     Cervical back: Normal range of motion and neck supple.  Lymphadenopathy:     Cervical: No cervical adenopathy.  Skin:    General: Skin is warm.  Neurological:     General: No focal deficit present.     Mental Status: She is alert.  Psychiatric:        Mood and Affect: Mood normal.      UC Treatments / Results  Labs (all labs ordered are listed, but only abnormal results are displayed) Labs Reviewed - No data to display  EKG   Radiology No results found.  Procedures Procedures (including critical care time)  Medications Ordered in UC Medications - No data to display  Initial Impression / Assessment and Plan / UC Course  I have reviewed the triage vital signs and the nursing notes.  Pertinent labs & imaging results that were available during my care of the patient were reviewed by me and considered in my medical decision making (see chart for details).   Final Clinical Impressions(s) / UC Diagnoses   Final diagnoses:  Acute non-recurrent maxillary sinusitis  Bronchitis     Discharge Instructions      You were seen today for upper respiratory symptoms.  I have diagnosed you with sinusitis and bronchitis.  I have sent out an oral antibiotic, cough medication and inhaler to take for your symptoms.  Please get plenty of rest and fluids.  You may continue over the counter medications for sinus congestion as well.  Please return if you are not improving as expected.     ED Prescriptions     Medication Sig Dispense Auth. Provider   doxycycline  (VIBRAMYCIN ) 100 MG capsule Take 1 capsule (100 mg total) by mouth 2 (two) times daily. 20 capsule Geral Tuch, MD   benzonatate  (TESSALON ) 200 MG capsule Take 1 capsule (200 mg total) by mouth 3 (three) times daily as needed for cough. 21 capsule Quanasia Defino, MD   albuterol  (VENTOLIN  HFA) 108 (90 Base) MCG/ACT inhaler Inhale 2 puffs into the lungs every  4 (four) hours as needed for wheezing or shortness of breath. 1 each Darral Longs, MD      PDMP not reviewed this encounter.   Darral Longs, MD 06/14/24 [phone]

## 2024-06-14 NOTE — Discharge Instructions (Signed)
 You were seen today for upper respiratory symptoms.  I have diagnosed you with sinusitis and bronchitis.  I have sent out an oral antibiotic, cough medication and inhaler to take for your symptoms.  Please get plenty of rest and fluids.  You may continue over the counter medications for sinus congestion as well.  Please return if you are not improving as expected.

## 2024-06-14 NOTE — ED Triage Notes (Signed)
 Pt states since 06/05/24 she has had cough, congestion, headache, headache, body sore from coughing so much. The last couple days she has had a productive cough and some wheezing last night. She states she has been taking mucinex, some [place] pill and antibiotic her boyfriend gave she she is unsure of the name, and a muscle relaxer.

## 2024-07-11 ENCOUNTER — Other Ambulatory Visit: Payer: Self-pay

## 2024-07-11 ENCOUNTER — Emergency Department (HOSPITAL_BASED_OUTPATIENT_CLINIC_OR_DEPARTMENT_OTHER)

## 2024-07-11 ENCOUNTER — Encounter (HOSPITAL_BASED_OUTPATIENT_CLINIC_OR_DEPARTMENT_OTHER): Payer: Self-pay | Admitting: *Deleted

## 2024-07-11 ENCOUNTER — Emergency Department (HOSPITAL_BASED_OUTPATIENT_CLINIC_OR_DEPARTMENT_OTHER)
Admission: EM | Admit: 2024-07-11 | Discharge: 2024-07-11 | Disposition: A | Discharge status: Home / Self Care | Attending: Emergency Medicine | Admitting: Emergency Medicine

## 2024-07-11 DIAGNOSIS — I129 Hypertensive chronic kidney disease with stage 1 through stage 4 chronic kidney disease, or unspecified chronic kidney disease: Secondary | ICD-10-CM | POA: Diagnosis not present

## 2024-07-11 DIAGNOSIS — E1122 Type 2 diabetes mellitus with diabetic chronic kidney disease: Secondary | ICD-10-CM | POA: Diagnosis not present

## 2024-07-11 DIAGNOSIS — N189 Chronic kidney disease, unspecified: Secondary | ICD-10-CM | POA: Diagnosis not present

## 2024-07-11 DIAGNOSIS — R519 Headache, unspecified: Secondary | ICD-10-CM | POA: Diagnosis not present

## 2024-07-11 DIAGNOSIS — R079 Chest pain, unspecified: Secondary | ICD-10-CM | POA: Diagnosis not present

## 2024-07-11 DIAGNOSIS — R0789 Other chest pain: Secondary | ICD-10-CM | POA: Diagnosis not present

## 2024-07-11 DIAGNOSIS — I6381 Other cerebral infarction due to occlusion or stenosis of small artery: Secondary | ICD-10-CM | POA: Diagnosis not present

## 2024-07-11 DIAGNOSIS — G44209 Tension-type headache, unspecified, not intractable: Secondary | ICD-10-CM | POA: Diagnosis not present

## 2024-07-11 DIAGNOSIS — M25512 Pain in left shoulder: Secondary | ICD-10-CM | POA: Diagnosis not present

## 2024-07-11 LAB — RESP PANEL BY RT-PCR (RSV, FLU A&B, COVID)  RVPGX2
Influenza A by PCR: NEGATIVE
Influenza B by PCR: NEGATIVE
Resp Syncytial Virus by PCR: NEGATIVE
SARS Coronavirus 2 by RT PCR: NEGATIVE

## 2024-07-11 LAB — BASIC METABOLIC PANEL WITH GFR
Anion gap: 11 (ref 5–15)
BUN: 20 mg/dL (ref 8–23)
CO2: 24 mmol/L (ref 22–32)
Calcium: 9.2 mg/dL (ref 8.9–10.3)
Chloride: 105 mmol/L (ref 98–111)
Creatinine, Ser: 1.17 mg/dL — ABNORMAL HIGH (ref 0.44–1.00)
GFR, Estimated: 52 mL/min — ABNORMAL LOW (ref >60)
Glucose, Bld: 110 mg/dL — ABNORMAL HIGH (ref 70–99)
Potassium: 5 mmol/L (ref 3.5–5.1)
Sodium: 139 mmol/L (ref 135–145)

## 2024-07-11 LAB — TROPONIN T, HIGH SENSITIVITY
Troponin T High Sensitivity: <15 ng/L (ref 0–19)
Troponin T High Sensitivity: <15 ng/L (ref 0–19)

## 2024-07-11 LAB — CBC
HCT: 43.4 % (ref 36.0–46.0)
Hemoglobin: 14.6 g/dL (ref 12.0–15.0)
MCH: 31 pg (ref 26.0–34.0)
MCHC: 33.6 g/dL (ref 30.0–36.0)
MCV: 92.1 fL (ref 80.0–100.0)
Platelets: 258 K/uL (ref 150–400)
RBC: 4.71 MIL/uL (ref 3.87–5.11)
RDW: 12.8 % (ref 11.5–15.5)
WBC: 6.3 K/uL (ref 4.0–10.5)
nRBC: 0 % (ref 0.0–0.2)

## 2024-07-11 MED ORDER — SODIUM CHLORIDE 0.9 % IV BOLUS
500.0000 mL | Freq: Once | INTRAVENOUS | Status: AC
  Administered 2024-07-11: 500 mL via INTRAVENOUS

## 2024-07-11 MED ORDER — DIPHENHYDRAMINE HCL 50 MG/ML IJ SOLN
12.5000 mg | Freq: Once | INTRAMUSCULAR | Status: AC
  Administered 2024-07-11: 12.5 mg via INTRAVENOUS
  Filled 2024-07-11: qty 1

## 2024-07-11 MED ORDER — PROCHLORPERAZINE EDISYLATE 10 MG/2ML IJ SOLN
10.0000 mg | Freq: Once | INTRAMUSCULAR | Status: AC
  Administered 2024-07-11: 10 mg via INTRAVENOUS
  Filled 2024-07-11: qty 2

## 2024-07-11 NOTE — ED Notes (Signed)
 Pt transported to CT ?

## 2024-07-11 NOTE — ED Triage Notes (Addendum)
 Pt has been having high blood pressure and headache and left shoulder pain since Friday.  No sob with this.  Pt also reports pain in her entire right side.  Pt has had URI for 2 weeks. Pt states that during triage her chest pain began.

## 2024-07-11 NOTE — Discharge Instructions (Addendum)
 You were seen today for your headache No specific cause was found today for your headache. It may have been a tension headache or other cause of headache. Stress, anxiety, fatigue, weather changes are common triggers for headaches.   You may take up to 1000mg  of tylenol  every 6 hours as needed for headache. Do not take more then 4g per day.    Tests performed today include: CT of your head which was normal and did not show any serious cause of your headache   Blood testing which showed normal blood counts and electrolytes.  Your kidney function (creatinine) was slightly elevated today.  This appears to be at your baseline.  Please have your PCP continue to monitor this value.  Your COVID, flu, and RSV is testing was negative  It is very unlikely that your chest pain was due to an issue in your heart.Your cardiac enzyme (troponin) was normal today. Your EKG which is a measure of the heart's electrical activity and rhythm is normal today. These would both show abnormalities if you were having a heart attack.  Your chest x-ray is normal today.  No signs of pneumonia or other abnormality.   Follow-up instructions: Please follow-up with your primary care provider if your headaches are not being controlled with over-the-counter medications like Tylenol  or becoming more frequent.  Return instructions:  Please return to the Emergency Department if you experience worsening symptoms. Return if the medications do not resolve your headache, if it recurs, or if you have multiple episodes of vomiting or cannot keep down fluids. Return if you have a change from your usual headache. RETURN IMMEDIATELY IF you: Develop a sudden, severe headache Develop confusion or become poorly responsive or faint Develop a fever above 100.55F or problem breathing Have a change in speech, vision, swallowing, or understanding Develop new weakness, numbness, tingling, incoordination in your arms or legs Have a  seizure Have lots of pain in your neck Please return if you have any other emergent concerns.

## 2024-07-11 NOTE — ED Notes (Signed)
 Pt returned from CT

## 2024-07-11 NOTE — ED Provider Notes (Signed)
 [place] [facility] Provider Note   CSN: [id number] Arrival date & time: 07/11/24  1307     Patient presents with: Headache   Margaret Webster is a 63 y.o. female with history of CKD, type 2 diabetes, hypertension, CVA with residual right-sided deficits, presents with concern for a on and off headache for the past 2 weeks.  States the headache is behind her forehead.  Reports the headache does seem to improve with Tylenol , but has not completely resolved.  She denies any photosensitivity or sensitivity to sounds, no nausea or vomiting.  No fever or chills.  She does report some chronic muscular neck pain. No nuchal rigidity. Denies any head trauma.  She reports she also had a cold about 3 weeks ago which has seemed to improve on its own.  She reports she is still taking some Mucinex.  She states she thinks she may have gotten this medication mixed up with her normal medications, and is unsure if she is taking her normal dosing of her blood pressure medications and metformin .   Upon arrival to the ER, she also reported some chest pain across the upper chest on both the left and the right side.  This last about 10 minutes and then resolved on its own.  This was non-exertional, non-pleuritic. It did not radiate to the back. She denies any current chest pain or shortness of breath.  She does report some pain in her left shoulder when this occurred as well.    Headache      Prior to Admission medications   Medication Sig Start Date End Date Taking? Authorizing Provider  acetaminophen  (TYLENOL ) 500 MG tablet Take 1 tablet (500 mg total) by mouth every 6 (six) hours as needed. 11/08/22   Rolinda Rogue, MD  albuterol  (VENTOLIN  HFA) 108 (90 Base) MCG/ACT inhaler Inhale 2 puffs into the lungs every 4 (four) hours as needed for wheezing or shortness of breath. 06/14/24   Piontek, Rocky, MD  amLODipine  (NORVASC ) 2.5 MG tablet TAKE 1 TABLET BY MOUTH ONCE  DAILY 02/19/24    Georgina Speaks, FNP  aspirin  EC 81 MG tablet Take 81 mg by mouth in the morning.    [provider]  Bempedoic Acid  (NEXLETOL ) 180 MG TABS Take 1 tablet (180 mg total) by mouth at bedtime. 04/17/23   Hilty, Vinie BROCKS, MD  benzonatate  (TESSALON ) 200 MG capsule Take 1 capsule (200 mg total) by mouth 3 (three) times daily as needed for cough. 06/14/24   Piontek, Rocky, MD  cetirizine  (ZYRTEC  ALLERGY) 10 MG tablet Take 1 tablet (10 mg total) by mouth daily. 05/21/23 05/20/24  [name], Janece, FNP  cyclobenzaprine  (FLEXERIL ) 10 MG tablet Take 1 tablet (10 mg total) by mouth 2 (two) times daily as needed for muscle spasms. 10/04/23   Dreama Rocky, MD  diclofenac  Sodium (VOLTAREN ) 1 % GEL Apply topically 4 (four) times daily as needed (pain.).    [provider]  doxycycline  (VIBRAMYCIN ) 100 MG capsule Take 1 capsule (100 mg total) by mouth 2 (two) times daily. 06/14/24   Piontek, Rocky, MD  DULoxetine (CYMBALTA) 30 MG capsule Take 60 mg by mouth daily at 2 PM. 07/06/22   [provider]  Evolocumab  (REPATHA  SURECLICK) 140 MG/ML SOAJ Inject 140 mg into the skin every 14 (fourteen) days. 03/15/24   Georgina Speaks, FNP  ezetimibe  (ZETIA ) 10 MG tablet Take 1 tablet (10 mg total) by mouth daily. 04/17/23   Hilty, Vinie BROCKS, MD  FARXIGA 5 MG TABS tablet Take 5 mg by mouth daily. 01/27/24   [provider]  levothyroxine  (SYNTHROID ) 75 MCG tablet Take 1 tablet (75 mcg total) by mouth daily before breakfast. 03/15/24   Georgina Speaks, FNP  lidocaine  (LIDODERM ) 5 % Place 1 patch onto the skin daily. Remove & Discard patch within 12 hours or as directed by MD 10/04/23   Dreama Longs, MD  losartan  (COZAAR ) 50 MG tablet Take 1 tablet (50 mg total) by mouth in the morning. 11/24/23   Georgina Speaks, FNP  metFORMIN  (GLUCOPHAGE -XR) 500 MG 24 hr tablet TAKE 1 TABLET BY MOUTH ONCE  DAILY WITH BREAKFAST 02/19/24   Georgina Speaks, FNP  Semaglutide ,0.25 or 0.5MG /DOS, 2 MG/3ML SOPN Inject 0.5 mg into the  skin once a week. 03/15/24   [name], Janece, FNP  triamcinolone  cream (KENALOG ) 0.5 % Apply 1 Application topically 2 (two) times daily. 04/20/24   Georgina Speaks, FNP  Vitamin D , Ergocalciferol , (DRISDOL ) 1.25 MG (50000 UNIT) CAPS capsule Take 1 capsule (50,000 Units total) by mouth every Monday. 06/01/23   Georgina Speaks, FNP    Allergies: Sulfa antibiotics, Amoxil  [amoxicillin ], Ampicillin, Crestor  [rosuvastatin ], Ibuprofen , Polytrim  [polymyxin b -trimethoprim ], Influenza vaccines, Lipitor [atorvastatin calcium ], and Naproxen    Review of Systems  Neurological:  Positive for headaches.    Updated Vital Signs BP (!) 141/50 (BP Location: Right Arm)   Pulse (!) 57   Temp 97.8 F (36.6 C) (Oral)   Resp 18   Ht 5' 7 (1.702 m)   Wt 98.9 kg   SpO2 100%   BMI 34.14 kg/m   Physical Exam Vitals and nursing note reviewed.  Constitutional:      General: She is not in acute distress.    Appearance: She is well-developed.  HENT:     Head: Normocephalic and atraumatic.     Right Ear: Tympanic membrane normal.     Left Ear: Tympanic membrane normal.     Nose: Nose normal.     Mouth/Throat:     Pharynx: No oropharyngeal exudate or posterior oropharyngeal erythema.  Eyes:     Extraocular Movements: Extraocular movements intact.     Conjunctiva/sclera: Conjunctivae normal.     Pupils: Pupils are equal, round, and reactive to light.  Neck:     Comments: Tender to palpation of the musculature of the neck by the trapezius bilaterally Cardiovascular:     Rate and Rhythm: Normal rate and regular rhythm.     Heart sounds: No murmur heard. Pulmonary:     Effort: Pulmonary effort is normal. No respiratory distress.     Breath sounds: Normal breath sounds.  Abdominal:     Palpations: Abdomen is soft.     Tenderness: There is no abdominal tenderness.  Musculoskeletal:        General: No swelling.       Arms:     Cervical back: Normal range of motion and neck supple.  Skin:    General: Skin is  warm and dry.     Capillary Refill: Capillary refill takes less than 2 seconds.  Neurological:     General: No focal deficit present.     Mental Status: She is alert.     Comments: Mental status: Alert and oriented to self, place, and month  Speech: Answers questions appropriately  Cranial Nerves: III, IV, VI: EOM intact, Pupils equal round and reactive, no gaze preference or deviation, no nystagmus. V: normal sensation in V1, V2, and V3 segments bilaterally VII: smiles, puffs cheeks,  raises eyebrows, and closes eyes without asymmetry.  VIII: normal hearing to speech IX, X: normal palatal elevation, no uvular deviation XI: 5/5 head turn and 5/5 shoulder shrug bilaterally XII: midline tongue protrusion  Motor: 4+/5 strength with the right elbow flexion and extension, wrist flexion and extension 5/5 strength with left elbow flexion and extension, wrist flexion and extension  5/5 strength with resisted ankle plantarflexion and dorsiflexion bilaterally    Sensory: Intact sensation in upper and lower extremity bilaterally   Gait: Normal   Psychiatric:        Mood and Affect: Mood normal.     (all labs ordered are listed, but only abnormal results are displayed) Labs Reviewed  BASIC METABOLIC PANEL WITH GFR - Abnormal; Notable for the following components:      Result Value   Glucose, Bld 110 (*)    Creatinine, Ser 1.17 (*)    GFR, Estimated 52 (*)    All other components within normal limits  RESP PANEL BY RT-PCR (RSV, FLU A&B, COVID)  RVPGX2  CBC  TROPONIN T, HIGH SENSITIVITY  TROPONIN T, HIGH SENSITIVITY    EKG: EKG Interpretation Date/Time:  Monday July 11 2024 13:24:15 EDT Ventricular Rate:  61 PR Interval:  149 QRS Duration:  97 QT Interval:  440 QTC Calculation: 444 R Axis:   71  Text Interpretation: Sinus rhythm Abnormal R-wave progression, early transition Borderline repolarization abnormality when compared to prior, similar appearance No sTEMI  Confirmed by Ginger Barefoot ([id number]) on 07/11/2024 4:38:36 PM  Radiology: CT Head Wo Contrast Result Date: 07/11/2024 CLINICAL DATA:  Headache, new onset (Age >= 51y) EXAM: CT HEAD WITHOUT CONTRAST TECHNIQUE: Contiguous axial images were obtained from the base of the skull through the vertex without intravenous contrast. RADIATION DOSE REDUCTION: This exam was performed according to the departmental dose-optimization program which includes automated exposure control, adjustment of the mA and/or kV according to patient size and/or use of iterative reconstruction technique. COMPARISON:  Head CT and brain MRI 11/04/2021 FINDINGS: Brain: No intracranial hemorrhage, mass effect, or midline shift. No hydrocephalus. The basilar cisterns are patent. No evidence of territorial infarct or acute ischemia. No extra-axial or intracranial fluid collection. The lacunar infarcts in the left thalamus and basal ganglia on MRI have no definite CT correlate. Vascular: No hyperdense vessel or unexpected calcification. Skull: Normal. Negative for fracture or focal lesion. Sinuses/Orbits: Paranasal sinuses and mastoid air cells are clear. The visualized orbits are unremarkable. Other: None. IMPRESSION: No acute intracranial abnormality or explanation for headache. Electronically Signed   By: Andrea Gasman M.D.   On: 07/11/2024 15:14   DG Chest 2 View Result Date: 07/11/2024 CLINICAL DATA:  left shoulder pain, hypertension 170's pta. EXAM: CHEST - 2 VIEW COMPARISON:  10/04/2023. FINDINGS: Bilateral lung fields are clear. Bilateral costophrenic angles are clear. Normal cardio-mediastinal silhouette. No acute osseous abnormalities. Cervicothoracic spinal fixation hardware noted. The soft tissues are within normal limits. IMPRESSION: No active cardiopulmonary disease. Electronically Signed   By: Ree Molt M.D.   On: 07/11/2024 13:53     Procedures   Medications Ordered in the ED  prochlorperazine  (COMPAZINE ) injection 10  mg (10 mg Intravenous Given 07/11/24 1432)  diphenhydrAMINE  (BENADRYL ) injection 12.5 mg (12.5 mg Intravenous Given 07/11/24 1432)  sodium chloride  0.9 % bolus 500 mL (0 mLs Intravenous Stopped 07/11/24 1525)    Clinical Course as of 07/11/24 1739  Mon Jul 11, 2024  1652 Upon reevaluation, patient states headache has resolved.  She remains without chest pain. [  AF]    Clinical Course User Index [AF] Veta Palma, PA-C                                 Medical Decision Making Amount and/or Complexity of Data Reviewed Labs: ordered. Radiology: ordered.  Risk Prescription drug management.     Differential diagnosis includes but is not limited to Tension headache, migraine, temporal arteritis, trigeminal neuralgia, cluster headache, meningitis, concussion, intracranial mass, intracranial hemorrhage, carbon monoxide poisoning, sinus venous thrombosis, sinusitis, ACS, URI, bronchitis    ED Course:  Upon initial evaluation, patient is well-appearing, no acute distress.  Stable vitals aside from mildly elevated blood pressure 154/74.  She is reporting on and off headache for the past 2 weeks.  This is a frontal headache.  No head trauma.  No neurologic deficits on exam.  She also reports some chest pain when she was in triage, but no chest pain currently.  No shortness of breath. Lungs clear to auscultation bilaterally  Labs Ordered: I Ordered, and personally interpreted labs.  The pertinent results include:   CBC within normal limits BMP with elevated creatinine 1.17, and this does appear to be at baseline.  Glucose mildly elevated at 110.  No other electrolyte abnormalities COVID, flu, RSV negative Troponin within normal limits  Imaging Studies ordered: I ordered imaging studies including CT head, chest x-ray I independently visualized the imaging with scope of interpretation limited to determining acute life threatening conditions related to emergency care.   CT head without  acute abnormality Chest x-ray without acute abnormality  I agree with the radiologist interpretation   Cardiac Monitoring: / EKG: The patient was maintained on a cardiac monitor.  I personally viewed and interpreted the cardiac monitored which showed an underlying rhythm of: Normal sinus rhythm   Medications Given: Benadryl  Compazine  NS bolus  Upon re-evaluation, patient reports her headache has resolved.   CT head did not show any abnormalities to explain her headache.  She does not have any nuchal rigidity, no leukocytosis, no fevers, no concern for meningitis at this time. She does have a slightly elevated blood pressure from normal at 141/50, but she states she has been mixing up her blood pressure medication with her Mucinex, so may not have been fully compliant which could cause her elevated blood pressure today. Given headache behind forehead without any other symptoms, suspect tension headache She states she has not had any further episodes of chest pain, and her left arm pain has resolved.  Her workup is reassuring today. Low concern for ACS at this time given troponin remains stable with initial troponin of less than 15 and repeat of less than 15, her chest pain was non-exertional, and EKG with normal sinus rhythm and no ST changes.  Chest x-ray without any acute abnormality. Low concern for PE at this time given her episode of chest pain earlier today was very brief, no associated pleuritic pain, no shortness of breath or tachycardia, and no leg swelling or pain.  Patient stable and appropriate for discharge home at this time.   Impression: Tension headache  Chest pain, resolved  Disposition:  The patient was discharged home with instructions to take tylenol  at home as needed for headache. Follow up with PCP for any further concerns.  Return precautions given.   This chart was dictated using voice recognition software, Dragon. Despite the best efforts of this provider  to proofread and correct errors,  errors may still occur which can change documentation meaning.       Final diagnoses:  Tension-type headache, not intractable, unspecified chronicity pattern    ED Discharge Orders     None          Veta Palma, PA-C 07/11/24 1739    Tonia Chew, MD 07/12/24 [phone]

## 2024-07-11 NOTE — ED Notes (Signed)
 Pt currently at xray. Will assume pt care upon return to ED RM 10

## 2024-07-18 ENCOUNTER — Encounter: Payer: Self-pay | Admitting: Nurse Practitioner

## 2024-07-18 ENCOUNTER — Ambulatory Visit (INDEPENDENT_AMBULATORY_CARE_PROVIDER_SITE_OTHER): Payer: Self-pay | Admitting: Nurse Practitioner

## 2024-07-18 VITALS — BP 120/70 | HR 64 | Temp 98.4°F | Ht 67.0 in | Wt 204.4 lb

## 2024-07-18 DIAGNOSIS — E782 Mixed hyperlipidemia: Secondary | ICD-10-CM | POA: Diagnosis not present

## 2024-07-18 DIAGNOSIS — I129 Hypertensive chronic kidney disease with stage 1 through stage 4 chronic kidney disease, or unspecified chronic kidney disease: Secondary | ICD-10-CM | POA: Diagnosis not present

## 2024-07-18 DIAGNOSIS — E1151 Type 2 diabetes mellitus with diabetic peripheral angiopathy without gangrene: Secondary | ICD-10-CM

## 2024-07-18 DIAGNOSIS — Z1231 Encounter for screening mammogram for malignant neoplasm of breast: Secondary | ICD-10-CM

## 2024-07-18 DIAGNOSIS — E66811 Obesity, class 1: Secondary | ICD-10-CM

## 2024-07-18 DIAGNOSIS — E039 Hypothyroidism, unspecified: Secondary | ICD-10-CM

## 2024-07-18 DIAGNOSIS — Z6832 Body mass index (BMI) 32.0-32.9, adult: Secondary | ICD-10-CM

## 2024-07-18 DIAGNOSIS — E6609 Other obesity due to excess calories: Secondary | ICD-10-CM

## 2024-07-18 DIAGNOSIS — G8929 Other chronic pain: Secondary | ICD-10-CM | POA: Diagnosis not present

## 2024-07-18 DIAGNOSIS — M545 Low back pain, unspecified: Secondary | ICD-10-CM

## 2024-07-18 DIAGNOSIS — N1831 Chronic kidney disease, stage 3a: Secondary | ICD-10-CM | POA: Diagnosis not present

## 2024-07-18 DIAGNOSIS — E1122 Type 2 diabetes mellitus with diabetic chronic kidney disease: Secondary | ICD-10-CM | POA: Diagnosis not present

## 2024-07-18 DIAGNOSIS — Z794 Long term (current) use of insulin: Secondary | ICD-10-CM | POA: Diagnosis not present

## 2024-07-18 DIAGNOSIS — M25551 Pain in right hip: Secondary | ICD-10-CM | POA: Diagnosis not present

## 2024-07-18 DIAGNOSIS — J0110 Acute frontal sinusitis, unspecified: Secondary | ICD-10-CM | POA: Insufficient documentation

## 2024-07-18 DIAGNOSIS — Z2821 Immunization not carried out because of patient refusal: Secondary | ICD-10-CM

## 2024-07-18 DIAGNOSIS — I10 Essential (primary) hypertension: Secondary | ICD-10-CM | POA: Insufficient documentation

## 2024-07-18 DIAGNOSIS — I739 Peripheral vascular disease, unspecified: Secondary | ICD-10-CM

## 2024-07-18 DIAGNOSIS — R21 Rash and other nonspecific skin eruption: Secondary | ICD-10-CM | POA: Diagnosis not present

## 2024-07-18 MED ORDER — LIDOCAINE 5 % EX PTCH: 1.0000 | MEDICATED_PATCH | CUTANEOUS | 0 refills | Status: AC

## 2024-07-18 MED ORDER — CETIRIZINE HCL 10 MG PO TABS: 10.0000 mg | ORAL_TABLET | Freq: Every day | ORAL | 2 refills | Status: AC

## 2024-07-18 MED ORDER — CYCLOBENZAPRINE HCL 10 MG PO TABS: 10.0000 mg | ORAL_TABLET | Freq: Two times a day (BID) | ORAL | 0 refills | Status: AC | PRN

## 2024-07-18 MED ORDER — TRIAMCINOLONE ACETONIDE 0.5 % EX CREA: 1.0000 | TOPICAL_CREAM | Freq: Two times a day (BID) | CUTANEOUS | 2 refills | Status: AC

## 2024-07-18 MED ORDER — PREDNISONE 10 MG PO TABS: ORAL_TABLET | ORAL | 0 refills | Status: DC

## 2024-07-18 MED ORDER — SEMAGLUTIDE(0.25 OR 0.5MG/DOS) 2 MG/3ML ~~LOC~~ SOPN: 0.5000 mg | PEN_INJECTOR | SUBCUTANEOUS | 1 refills | Status: AC

## 2024-07-18 MED ORDER — MOMETASONE FUROATE 50 MCG/ACT NA SUSP: 2.0000 | Freq: Every day | NASAL | 2 refills | Status: AC

## 2024-07-18 NOTE — Assessment & Plan Note (Signed)
 Chronic sinusitis with tension headaches likely due to sinus congestion. Recent ER visit for severe symptoms. - Prescribed Nasonex  nasal spray, two sprays once daily. - Prescribed short course of steroids, two tablets in the morning for three days. - Advised Tylenol  or ibuprofen  as needed for headache relief.

## 2024-07-18 NOTE — Progress Notes (Signed)
 LILLETTE Kristeen JINNY Gladis, CMA,acting as a Neurosurgeon for [name], FNP.,have documented all relevant documentation on the behalf of [name], FNP,as directed by  [name], FNP while in the presence of [name], FNP.  Subjective:  Patient ID: Margaret Webster , female    DOB: 01/18/1961 , 63 y.o.   MRN: [id number]  Chief Complaint  Patient presents with   Hypertension    Patient presents today for a bp and dm follow up, Patient reports compliance with medication. Patient denies any chest pain, SOB.   Headache    Patient was seen at ER for Tension-type headache, not intractable, unspecified chronicity pattern on 07/11/2024 patient reports today she is feeling. She reports she needs a note to go back to work on Wednesday, she was taken out of work by [employer].   Sinus Problem    Patient reports she has a runny/stuffy nose and sinus head pressure.      HPI  Discussed the use of AI scribe software for clinical note transcription with the patient, who gave verbal consent to proceed.  History of Present Illness Margaret Webster is a 63 year old female who presents with persistent sinus and bronchitis following a recent trip to see her granddaughter.  She has been experiencing severe sinus and bronchial symptoms, including sneezing, nasal congestion, and headaches, for approximately four weeks after returning from a trip to [place] . She visited the ER on September 15th due to weakness and elevated blood pressure, where she received Benadryl , medication for nausea, and IV fluids. She also visited urgent care on August 19th, was diagnosed with sinusitis and bronchitis, and was prescribed an antibiotic. Despite treatment, she continues to experience headaches and sinus congestion. No current use of nasal spray.  She reports ongoing low back and hip pain, attributed to a past disc issue and previous surgery in 1993. She reports having received 'Trilyte shots' in her hip in the past for pain. The pain has been  persistent for years and is exacerbated by physical activity.  She has a history of thyroid  issues and is currently taking levothyroxine  75 mcg daily. She has missed doses in the past and is concerned about the impact on her symptoms.  She is currently smoking approximately two and a half cigarettes per day and is trying to reduce her smoking. She also takes Tylenol  for headaches and tension headache medication, which provides some relief.  Her medication list includes Repatha , Ozempic , and Doreen, among others. She has expressed confusion about her current medication regimen and is seeking clarification on which medications she should be taking.   Past Medical History:  Diagnosis Date   Acute pain of right shoulder 09/19/2019   Arthritis    bil shoulders   Bronchitis    Chronic back pain    Chronic kidney disease    Diabetes mellitus    stopped Mrtformin 1 mo ago, made her sick   Fatty liver    Hypertension    Hypothyroidism    Pain 04/07/2014   Stroke (HCC)    either 2019/2020   Thyroid  disease    Trigger thumb of right hand      Family History  Problem Relation Age of Onset   Diabetes Mother    Cystic fibrosis Sister    Cancer Sister        ?   Breast cancer Maternal Aunt    Heart disease Maternal Grandmother    Clotting disorder Maternal Grandfather      Current  Outpatient Medications:    acetaminophen  (TYLENOL ) 500 MG tablet, Take 1 tablet (500 mg total) by mouth every 6 (six) hours as needed., Disp: 30 tablet, Rfl: 0   albuterol  (VENTOLIN  HFA) 108 (90 Base) MCG/ACT inhaler, Inhale 2 puffs into the lungs every 4 (four) hours as needed for wheezing or shortness of breath., Disp: 1 each, Rfl: 0   amLODipine  (NORVASC ) 2.5 MG tablet, TAKE 1 TABLET BY MOUTH ONCE  DAILY, Disp: 100 tablet, Rfl: 2   aspirin  EC 81 MG tablet, Take 81 mg by mouth in the morning., Disp: , Rfl:    Bempedoic Acid  (NEXLETOL ) 180 MG TABS, Take 1 tablet (180 mg total) by mouth at bedtime., Disp:  90 tablet, Rfl: 2   Evolocumab  (REPATHA  SURECLICK) 140 MG/ML SOAJ, Inject 140 mg into the skin every 14 (fourteen) days., Disp: 2 mL, Rfl: 5   ezetimibe  (ZETIA ) 10 MG tablet, Take 1 tablet (10 mg total) by mouth daily., Disp: 90 tablet, Rfl: 2   FARXIGA 5 MG TABS tablet, Take 5 mg by mouth daily., Disp: , Rfl:    levothyroxine  (SYNTHROID ) 75 MCG tablet, Take 1 tablet (75 mcg total) by mouth daily before breakfast., Disp: 90 tablet, Rfl: 1   losartan  (COZAAR ) 50 MG tablet, Take 1 tablet (50 mg total) by mouth in the morning., Disp: 90 tablet, Rfl: 1   metFORMIN  (GLUCOPHAGE -XR) 500 MG 24 hr tablet, TAKE 1 TABLET BY MOUTH ONCE  DAILY WITH BREAKFAST, Disp: 100 tablet, Rfl: 2   mometasone  (NASONEX ) 50 MCG/ACT nasal spray, Place 2 sprays into the nose daily., Disp: 1 each, Rfl: 2   predniSONE  (DELTASONE ) 10 MG tablet, Take 2 tabs by mouth daily x 3 days in am, Disp: 6 tablet, Rfl: 0   Vitamin D , Ergocalciferol , (DRISDOL ) 1.25 MG (50000 UNIT) CAPS capsule, Take 1 capsule (50,000 Units total) by mouth every Monday., Disp: 12 capsule, Rfl: 1   cetirizine  (ZYRTEC  ALLERGY) 10 MG tablet, Take 1 tablet (10 mg total) by mouth daily., Disp: 30 tablet, Rfl: 2   cyclobenzaprine  (FLEXERIL ) 10 MG tablet, Take 1 tablet (10 mg total) by mouth 2 (two) times daily as needed for muscle spasms., Disp: 20 tablet, Rfl: 0   lidocaine  (LIDODERM ) 5 %, Place 1 patch onto the skin daily. Remove & Discard patch within 12 hours or as directed by MD, Disp: 30 patch, Rfl: 0   Semaglutide ,0.25 or 0.5MG /DOS, 2 MG/3ML SOPN, Inject 0.5 mg into the skin once a week., Disp: 9 mL, Rfl: 1   triamcinolone  cream (KENALOG ) 0.5 %, Apply 1 Application topically 2 (two) times daily., Disp: 60 g, Rfl: 2   Allergies  Allergen Reactions   Sulfa Antibiotics Other (See Comments)    Hard to breathe   Amoxil  [Amoxicillin ]    Ampicillin Other (See Comments) and Hypertension    Bloody stools    Crestor  [Rosuvastatin ] Other (See Comments)    Makes her  mouth numb and causes side pain   Ibuprofen  Other (See Comments)    Stomach upset   Polytrim  [Polymyxin B -Trimethoprim ] Itching and Swelling    redness   Influenza Vaccines Hives   Lipitor [Atorvastatin Calcium ] Other (See Comments)    Muscle ache    Naproxen Nausea And Vomiting     Review of Systems  Constitutional: Negative.   HENT: Negative.    Eyes: Negative.   Respiratory: Negative.    Cardiovascular: Negative.   Gastrointestinal: Negative.   Endocrine: Negative.   Genitourinary: Negative.   Musculoskeletal: Negative.  Skin: Negative.   Allergic/Immunologic: Negative.   Neurological: Negative.   Hematological: Negative.   Psychiatric/Behavioral: Negative.       Today's Vitals   07/18/24 0827  BP: 120/70  Pulse: 64  Temp: 98.4 F (36.9 C)  TempSrc: Oral  Weight: 204 lb 6.4 oz (92.7 kg)  Height: 5' 7 (1.702 m)  PainSc: 6   PainLoc: Hip   Body mass index is 32.01 kg/m.  Wt Readings from Last 3 Encounters:  07/18/24 204 lb 6.4 oz (92.7 kg)  07/11/24 218 lb (98.9 kg)  04/20/24 205 lb (93 kg)     Objective:  Physical Exam Vitals and nursing note reviewed.  Constitutional:      General: She is not in acute distress.    Appearance: Normal appearance. She is well-developed. She is obese.  HENT:     Head: Normocephalic and atraumatic.     Right Ear: Hearing, tympanic membrane, ear canal and external ear normal. There is no impacted cerumen.     Left Ear: Hearing, tympanic membrane, ear canal and external ear normal. There is no impacted cerumen.     Nose: Nose normal.     Mouth/Throat:     Mouth: Mucous membranes are moist.  Eyes:     General: Lids are normal.     Extraocular Movements: Extraocular movements intact.     Conjunctiva/sclera: Conjunctivae normal.     Pupils: Pupils are equal, round, and reactive to light.     Funduscopic exam:    Right eye: No papilledema.        Left eye: No papilledema.  Neck:     Thyroid : No thyroid  mass.      Vascular: No carotid bruit.  Cardiovascular:     Rate and Rhythm: Normal rate and regular rhythm.     Pulses: Normal pulses.     Heart sounds: Normal heart sounds. No murmur heard. Pulmonary:     Effort: Pulmonary effort is normal. No respiratory distress.     Breath sounds: Normal breath sounds. No wheezing.  Chest:     Chest wall: No mass.  Breasts:    Tanner Score is 5.     Right: Normal. No mass or tenderness.     Left: Normal. No mass or tenderness.  Abdominal:     General: Abdomen is flat. Bowel sounds are normal. There is no distension.     Palpations: Abdomen is soft.     Tenderness: There is no abdominal tenderness.  Genitourinary:    Rectum: Guaiac result negative.  Musculoskeletal:        General: No swelling or tenderness.     Right shoulder: Normal range of motion.     Left shoulder: Normal range of motion.     Cervical back: Full passive range of motion without pain, normal range of motion and neck supple. No rigidity.     Right lower leg: No edema.     Left lower leg: No edema.  Lymphadenopathy:     Upper Body:     Right upper body: No supraclavicular, axillary or pectoral adenopathy.     Left upper body: No supraclavicular, axillary or pectoral adenopathy.  Skin:    General: Skin is warm and dry.     Capillary Refill: Capillary refill takes less than 2 seconds.  Neurological:     General: No focal deficit present.     Mental Status: She is alert and oriented to person, place, and time.     Cranial Nerves: No  cranial nerve deficit.     Sensory: No sensory deficit.  Psychiatric:        Mood and Affect: Mood normal.        Behavior: Behavior normal.        Thought Content: Thought content normal.        Judgment: Judgment normal.         Assessment And Plan:  Benign hypertension Assessment & Plan: Blood pressure is well controlled today, better than when she was at ER   Acquired hypothyroidism Assessment & Plan: Hypothyroidism with improper  levothyroxine  timing affecting thyroid  levels. - Educated on proper timing of levothyroxine , 1-2 hours before other medications. - Recheck thyroid  levels to determine if medication adjustment is needed.  Orders: -     TSH + free T4  Type 2 diabetes mellitus with stage 3a chronic kidney disease, with long-term current use of insulin  (HCC) Assessment & Plan: Medication adherence issues noted. - Check with nephrologist regarding continuation of Farxiga. - Ensure follow-up with nephrologist by end of the year.  Orders: -     Lipid panel -     Semaglutide (0.25 or 0.5MG /DOS); Inject 0.5 mg into the skin once a week.  Dispense: 9 mL; Refill: 1 -     Hemoglobin A1c  Mixed hyperlipidemia Assessment & Plan: Mixed hyperlipidemia with medication adherence issues. Zetia  and Nexletol  not refilled. - Follow up with cardiologist, Dr. Mona, to discuss continuation of Zetia  and Nexletol . - Schedule appointment with Dr. Mona to address medication management.  Orders: -     Lipid panel  Herpes zoster vaccination declined  Right hip pain -     Ambulatory referral to Orthopedic Surgery  Chronic midline low back pain without sciatica Assessment & Plan: Chronic right hip and low back pain, possibly related to previous disc issues and surgery. - Refer to dermatologist for evaluation of back breakouts. - Discuss consulting a different orthopedic specialist.   Rash and nonspecific skin eruption Assessment & Plan: Will refer to Dermatology at patient request  Orders: -     Ambulatory referral to Dermatology -     Triamcinolone  Acetonide; Apply 1 Application topically 2 (two) times daily.  Dispense: 60 g; Refill: 2  Acute non-recurrent frontal sinusitis Assessment & Plan: Chronic sinusitis with tension headaches likely due to sinus congestion. Recent ER visit for severe symptoms. - Prescribed Nasonex  nasal spray, two sprays once daily. - Prescribed short course of steroids, two tablets in the  morning for three days. - Advised Tylenol  or ibuprofen  as needed for headache relief.  Orders: -     Mometasone  Furoate; Place 2 sprays into the nose daily.  Dispense: 1 each; Refill: 2 -     predniSONE ; Take 2 tabs by mouth daily x 3 days in am  Dispense: 6 tablet; Refill: 0 -     Cetirizine  HCl; Take 1 tablet (10 mg total) by mouth daily.  Dispense: 30 tablet; Refill: 2  PAD (peripheral artery disease) (HCC) Assessment & Plan: Unable to tolerate statin, continue Repatha   Orders: -     Semaglutide (0.25 or 0.5MG /DOS); Inject 0.5 mg into the skin once a week.  Dispense: 9 mL; Refill: 1  Class 1 obesity due to excess calories with body mass index (BMI) of 32.0 to 32.9 in adult, unspecified whether serious comorbidity present  Encounter for screening mammogram for breast cancer -     3D Screening Mammogram, Left and Right; Future  Other orders -     Cyclobenzaprine  HCl; Take 1  tablet (10 mg total) by mouth 2 (two) times daily as needed for muscle spasms.  Dispense: 20 tablet; Refill: 0 -     Lidocaine ; Place 1 patch onto the skin daily. Remove & Discard patch within 12 hours or as directed by MD  Dispense: 30 patch; Refill: 0     Return for NEEDS AWV ASAP.  Patient was given opportunity to ask questions. Patient verbalized understanding of the plan and was able to repeat key elements of the plan. All questions were answered to their satisfaction.    LILLETTE [name], FNP, have reviewed all documentation for this visit. The documentation on 07/18/24 for the exam, diagnosis, procedures, and orders are all accurate and complete.   IF YOU HAVE BEEN REFERRED TO A SPECIALIST, IT MAY TAKE 1-2 WEEKS TO SCHEDULE/PROCESS THE REFERRAL. IF YOU HAVE NOT HEARD FROM US /SPECIALIST IN TWO WEEKS, PLEASE GIVE US  A CALL AT [phone].

## 2024-07-18 NOTE — Assessment & Plan Note (Signed)
 Unable to tolerate statin, continue Repatha 

## 2024-07-18 NOTE — Patient Instructions (Addendum)
 Dr. Mona (Cardiology) Address: [address], [place], [place] [address] Phone: [phone]  Dr. Mona had prescribed your ezetimibe  and nexlitol. See if he wants you to continue this medication.

## 2024-07-18 NOTE — Assessment & Plan Note (Signed)
 Will refer to Dermatology at patient request

## 2024-07-18 NOTE — Assessment & Plan Note (Signed)
 Medication adherence issues noted. - Check with nephrologist regarding continuation of Farxiga. - Ensure follow-up with nephrologist by end of the year.

## 2024-07-18 NOTE — Assessment & Plan Note (Signed)
 Chronic right hip and low back pain, possibly related to previous disc issues and surgery. - Refer to dermatologist for evaluation of back breakouts. - Discuss consulting a different orthopedic specialist.

## 2024-07-18 NOTE — Assessment & Plan Note (Signed)
 Hypothyroidism with improper levothyroxine  timing affecting thyroid  levels. - Educated on proper timing of levothyroxine , 1-2 hours before other medications. - Recheck thyroid  levels to determine if medication adjustment is needed.

## 2024-07-18 NOTE — Assessment & Plan Note (Signed)
 Mixed hyperlipidemia with medication adherence issues. Zetia  and Nexletol  not refilled. - Follow up with cardiologist, Dr. Mona, to discuss continuation of Zetia  and Nexletol . - Schedule appointment with Dr. Mona to address medication management.

## 2024-07-18 NOTE — Assessment & Plan Note (Signed)
 Blood pressure is well controlled today, better than when she was at ER

## 2024-07-19 LAB — LIPID PANEL
Chol/HDL Ratio: 3.8 ratio (ref 0.0–4.4)
Cholesterol, Total: 177 mg/dL (ref 100–199)
HDL: 46 mg/dL (ref >39)
LDL Chol Calc (NIH): 89 mg/dL (ref 0–99)
Triglycerides: 252 mg/dL — ABNORMAL HIGH (ref 0–149)
VLDL Cholesterol Cal: 42 mg/dL — ABNORMAL HIGH (ref 5–40)

## 2024-07-19 LAB — HEMOGLOBIN A1C
Est. average glucose Bld gHb Est-mCnc: 123 mg/dL
Hgb A1c MFr Bld: 5.9 % — ABNORMAL HIGH (ref 4.8–5.6)

## 2024-07-19 LAB — TSH+FREE T4
Free T4: 1.12 ng/dL (ref 0.82–1.77)
TSH: 5.75 u[IU]/mL — ABNORMAL HIGH (ref 0.450–4.500)

## 2024-07-22 ENCOUNTER — Ambulatory Visit

## 2024-08-03 ENCOUNTER — Ambulatory Visit

## 2024-08-03 DIAGNOSIS — Z0000 Encounter for general adult medical examination without abnormal findings: Secondary | ICD-10-CM

## 2024-08-03 NOTE — Patient Instructions (Signed)
 Margaret Webster,  Thank you for taking the time for your Medicare Wellness Visit. I appreciate your continued commitment to your health goals. Please review the care plan we discussed, and feel free to reach out if I can assist you further.  Medicare recommends these wellness visits once per year to help you and your care team stay ahead of potential health issues. These visits are designed to focus on prevention, allowing your provider to concentrate on managing your acute and chronic conditions during your regular appointments.  Please note that Annual Wellness Visits do not include a physical exam. Some assessments may be limited, especially if the visit was conducted virtually. If needed, we may recommend a separate in-person follow-up with your provider.  Ongoing Care Seeing your primary care provider every 3 to 6 months helps us  monitor your health and provide consistent, personalized care.   Referrals If a referral was made during today's visit and you haven't received any updates within two weeks, please contact the referred provider directly to check on the status.  Recommended Screenings:  Health Maintenance  Topic Date Due   Breast Cancer Screening  07/09/2024   Eye exam for diabetics  08/03/2024   COVID-19 Vaccine (4 - 2025-26 season) 08/03/2024*   Zoster (Shingles) Vaccine (2 of 2) 10/17/2024*   Flu Shot  01/24/2025*   Yearly kidney health urinalysis for diabetes  11/23/2024   Hemoglobin A1C  01/15/2025   Complete foot exam   04/20/2025   Yearly kidney function blood test for diabetes  07/11/2025   Medicare Annual Wellness Visit  08/03/2025   Pap with HPV screening  12/03/2026   Cologuard (Stool DNA test)  12/22/2026   DTaP/Tdap/Td vaccine (5 - Td or Tdap) 11/27/2032   Pneumococcal Vaccine for age over 13  Completed   DEXA scan (bone density measurement)  Completed   Hepatitis C Screening  Completed   HIV Screening  Completed   Hepatitis B Vaccine  Aged Out   Meningitis B  Vaccine  Aged Out   Colon Cancer Screening  Discontinued  *Topic was postponed. The date shown is not the original due date.       08/03/2024    3:22 PM  Advanced Directives  Does Patient Have a Medical Advance Directive? No  Would patient like information on creating a medical advance directive? No - Patient declined   Advance Care Planning is important because it: Ensures you receive medical care that aligns with your values, goals, and preferences. Provides guidance to your family and loved ones, reducing the emotional burden of decision-making during critical moments.  Vision: Annual vision screenings are recommended for early detection of glaucoma, cataracts, and diabetic retinopathy. These exams can also reveal signs of chronic conditions such as diabetes and high blood pressure.  Dental: Annual dental screenings help detect early signs of oral cancer, gum disease, and other conditions linked to overall health, including heart disease and diabetes.  Please see the attached documents for additional preventive care recommendations.

## 2024-08-03 NOTE — Progress Notes (Signed)
 Subjective:   Margaret Webster is a 63 y.o. who presents for a Medicare Wellness preventive visit.  As a reminder, Annual Wellness Visits don't include a physical exam, and some assessments may be limited, especially if this visit is performed virtually. We may recommend an in-person follow-up visit with your provider if needed.  Visit Complete: Virtual I connected with  Margaret Webster on 08/03/24 by a audio enabled telemedicine application and verified that I am speaking with the correct person using two identifiers.  Patient Location: Home  Provider Location: Office/Clinic  I discussed the limitations of evaluation and management by telemedicine. The patient expressed understanding and agreed to proceed.  Vital Signs: Because this visit was a virtual/telehealth visit, some criteria may be missing or patient reported. Any vitals not documented were not able to be obtained and vitals that have been documented are patient reported.  VideoError- Librarian, academic were attempted between this provider and patient, however failed, due to patient having technical difficulties OR patient did not have access to video capability.  We continued and completed visit with audio only.   Persons Participating in Visit: Patient.  AWV Questionnaire: No: Patient Medicare AWV questionnaire was not completed prior to this visit.  Cardiac Risk Factors include: advanced age (>28men, >14 women);diabetes mellitus;hypertension     Objective:    Today's Vitals   08/03/24 1516  PainSc: 8    There is no height or weight on file to calculate BMI.     08/03/2024    3:22 PM 07/11/2024    1:24 PM 10/04/2023    6:41 AM 06/17/2023    8:20 AM 11/22/2022    2:01 PM 08/01/2022    7:12 AM 05/08/2022    2:52 PM  Advanced Directives  Does Patient Have a Medical Advance Directive? No No No No No No No  Would patient like information on creating a medical advance directive? No - Patient  declined    No - Patient declined Yes (MAU/Ambulatory/Procedural Areas - Information given) No - Patient declined    Current Medications (verified) Outpatient Encounter Medications as of 08/03/2024  Medication Sig   acetaminophen  (TYLENOL ) 500 MG tablet Take 1 tablet (500 mg total) by mouth every 6 (six) hours as needed.   albuterol  (VENTOLIN  HFA) 108 (90 Base) MCG/ACT inhaler Inhale 2 puffs into the lungs every 4 (four) hours as needed for wheezing or shortness of breath.   amLODipine  (NORVASC ) 2.5 MG tablet TAKE 1 TABLET BY MOUTH ONCE  DAILY   aspirin  EC 81 MG tablet Take 81 mg by mouth in the morning.   Bempedoic Acid  (NEXLETOL ) 180 MG TABS Take 1 tablet (180 mg total) by mouth at bedtime.   cetirizine  (ZYRTEC  ALLERGY) 10 MG tablet Take 1 tablet (10 mg total) by mouth daily.   cyclobenzaprine  (FLEXERIL ) 10 MG tablet Take 1 tablet (10 mg total) by mouth 2 (two) times daily as needed for muscle spasms.   Evolocumab  (REPATHA  SURECLICK) 140 MG/ML SOAJ Inject 140 mg into the skin every 14 (fourteen) days.   ezetimibe  (ZETIA ) 10 MG tablet Take 1 tablet (10 mg total) by mouth daily.   FARXIGA 5 MG TABS tablet Take 5 mg by mouth daily.   levothyroxine  (SYNTHROID ) 75 MCG tablet Take 1 tablet (75 mcg total) by mouth daily before breakfast.   lidocaine  (LIDODERM ) 5 % Place 1 patch onto the skin daily. Remove & Discard patch within 12 hours or as directed by MD   losartan  (COZAAR )  50 MG tablet Take 1 tablet (50 mg total) by mouth in the morning.   metFORMIN  (GLUCOPHAGE -XR) 500 MG 24 hr tablet TAKE 1 TABLET BY MOUTH ONCE  DAILY WITH BREAKFAST   mometasone  (NASONEX ) 50 MCG/ACT nasal spray Place 2 sprays into the nose daily.   predniSONE  (DELTASONE ) 10 MG tablet Take 2 tabs by mouth daily x 3 days in am   Semaglutide ,0.25 or 0.5MG /DOS, 2 MG/3ML SOPN Inject 0.5 mg into the skin once a week.   triamcinolone  cream (KENALOG ) 0.5 % Apply 1 Application topically 2 (two) times daily.   Vitamin D , Ergocalciferol ,  (DRISDOL ) 1.25 MG (50000 UNIT) CAPS capsule Take 1 capsule (50,000 Units total) by mouth every Monday.   No facility-administered encounter medications on file as of 08/03/2024.    Allergies (verified) Sulfa antibiotics, Amoxil  [amoxicillin ], Ampicillin, Crestor  [rosuvastatin ], Ibuprofen , Polytrim  [polymyxin b -trimethoprim ], Influenza vaccines, Lipitor [atorvastatin calcium ], and Naproxen   History: Past Medical History:  Diagnosis Date   Acute pain of right shoulder 09/19/2019   Arthritis    bil shoulders   Bronchitis    Chronic back pain    Chronic kidney disease    Diabetes mellitus    stopped Mrtformin 1 mo ago, made her sick   Fatty liver    Hypertension    Hypothyroidism    Pain 04/07/2014   Stroke (HCC)    either 2019/2020   Thyroid  disease    Trigger thumb of right hand    Past Surgical History:  Procedure Laterality Date   BACK SURGERY  1993   CARPAL TUNNEL RELEASE  08/2015   CARPAL TUNNEL RELEASE Left 09/10/2017   Procedure: CARPAL TUNNEL RELEASE;  Surgeon: Murrell Drivers, MD;  Location: [facility];  Service: Orthopedics;  Laterality: Left;   CERVICAL SPINE SURGERY Right 11/2012   ESOPHAGOGASTRODUODENOSCOPY (EGD) WITH PROPOFOL  N/A 08/01/2022   Procedure: ESOPHAGOGASTRODUODENOSCOPY (EGD) WITH PROPOFOL ;  Surgeon: Rollin Dover, MD;  Location: [facility];  Service: Gastroenterology;  Laterality: N/A;   OOPHORECTOMY     TRIGGER FINGER RELEASE Right 08/06/2017   Procedure: RIGHT THUMB TRIGGER RELEASE;  Surgeon: Murrell Drivers, MD;  Location: [facility];  Service: Orthopedics;  Laterality: Right;   ULNAR COLLATERAL LIGAMENT REPAIR Right 03/21/2021   Procedure: RIGHT THUMB ULNAR COLLATERAL LIGAMENT REPAIR;  Surgeon: Murrell Drivers, MD;  Location: [facility];  Service: Orthopedics;  Laterality: Right;   UPPER ESOPHAGEAL ENDOSCOPIC ULTRASOUND (EUS) N/A 08/01/2022   Procedure: UPPER ESOPHAGEAL ENDOSCOPIC ULTRASOUND (EUS);  Surgeon:  Rollin Dover, MD;  Location: [facility];  Service: Gastroenterology;  Laterality: N/A;   Family History  Problem Relation Age of Onset   Diabetes Mother    Cystic fibrosis Sister    Cancer Sister        ?   Breast cancer Maternal Aunt    Heart disease Maternal Grandmother    Clotting disorder Maternal Grandfather    Social History   Socioeconomic History   Marital status: Single    Spouse name: Not on file   Number of children: 1   Years of education: Not on file   Highest education level: Not on file  Occupational History   Occupation: DISABLED    Employer: DISABLED  Tobacco Use   Smoking status: Every Day    Current packs/day: 0.25    Average packs/day: 0.3 packs/day for 45.0 years (11.3 ttl pk-yrs)    Types: Cigarettes   Smokeless tobacco: Never   Tobacco comments:    4 cig a day,  not interested in chantix , 11/27/22 - she is smoking one cigarette a day; 11/24/23 cutting back on her smoking, 07/18/24 - 2.5 per day  Vaping Use   Vaping status: Never Used  Substance and Sexual Activity   Alcohol use: Yes    Comment: occasional   Drug use: Yes    Types: Marijuana   Sexual activity: Yes    Partners: Male    Birth control/protection: None  Other Topics Concern   Not on file  Social History Narrative   Not on file   Social Drivers of Health   Financial Resource Strain: Low Risk  (08/03/2024)   Overall Financial Resource Strain (CARDIA)    Difficulty of Paying Living Expenses: Not hard at all  Food Insecurity: No Food Insecurity (08/03/2024)   Hunger Vital Sign    Worried About Running Out of Food in the Last Year: Never true    Ran Out of Food in the Last Year: Never true  Transportation Needs: No Transportation Needs (08/03/2024)   PRAPARE - Administrator, Civil Service (Medical): No    Lack of Transportation (Non-Medical): No  Physical Activity: Sufficiently Active (08/03/2024)   Exercise Vital Sign    Days of Exercise per Week: 3 days    Minutes of  Exercise per Session: 60 min  Stress: No Stress Concern Present (08/03/2024)   [employer] of Occupational Health - Occupational Stress Questionnaire    Feeling of Stress: Not at all  Social Connections: Socially Isolated (08/03/2024)   Social Connection and Isolation Panel    Frequency of Communication with Friends and Family: More than three times a week    Frequency of Social Gatherings with Friends and Family: Twice a week    Attends Religious Services: Never    Database administrator or Organizations: No    Attends Engineer, structural: Never    Marital Status: Never married    Tobacco Counseling Ready to quit: Yes Counseling given: Not Answered Tobacco comments: 4 cig a day, not interested in chantix , 11/27/22 - she is smoking one cigarette a day; 11/24/23 cutting back on her smoking, 07/18/24 - 2.5 per day    Clinical Intake:  Pre-visit preparation completed: Yes  Pain : 0-10 Pain Score: 8  Pain Type: Chronic pain Pain Location: Back Pain Radiating Towards: down hip Pain Descriptors / Indicators: Aching Pain Onset: More than a month ago Pain Frequency: Constant     Nutritional Risks: None Diabetes: Yes CBG done?: No Did pt. bring in CBG monitor from home?: No  Lab Results  Component Value Date   HGBA1C 5.9 (H) 07/18/2024   HGBA1C 5.8 (H) 04/20/2024   HGBA1C 6.1 (H) 11/24/2023     How often do you need to have someone help you when you read instructions, pamphlets, or other written materials from your doctor or pharmacy?: 1 - Never  Interpreter Needed?: No  Information entered by :: NAllen LPN   Activities of Daily Living     08/03/2024    3:17 PM  In your present state of health, do you have any difficulty performing the following activities:  Hearing? 0  Vision? 1  Comment can't see at night well  Difficulty concentrating or making decisions? 0  Walking or climbing stairs? 1  Comment due to back and hip  Dressing or bathing? 0  Doing  errands, shopping? 0  Preparing Food and eating ? N  Using the Toilet? N  In the past six months, have  you accidently leaked urine? N  Do you have problems with loss of bowel control? N  Managing your Medications? N  Managing your Finances? N  Housekeeping or managing your Housekeeping? N    Patient Care Team: Georgina Speaks, FNP as PCP - General (General Practice) Santo Stanly LABOR, MD as PCP - Cardiology (Cardiology)  I have updated your Care Teams any recent Medical Services you may have received from other providers in the past year.     Assessment:   This is a routine wellness examination for Margaret Webster.  Hearing/Vision screen Hearing Screening - Comments:: Denies hearing issues Vision Screening - Comments:: Regular eye exams, [facility]   Goals Addressed             This Visit's Progress    Patient Stated       08/03/2024, weight loss       Depression Screen     08/03/2024    3:23 PM 04/20/2024    9:55 AM 11/24/2023    8:35 AM 06/17/2023    8:22 AM 05/21/2023    4:03 PM 05/08/2022    2:54 PM 05/08/2022    2:13 PM  PHQ 2/9 Scores  PHQ - 2 Score 1 5 0 0 0 0 0  PHQ- 9 Score  8 0 1 2      Fall Risk     08/03/2024    3:23 PM 04/20/2024    9:55 AM 11/24/2023    8:34 AM 06/17/2023    8:21 AM 05/21/2023    4:03 PM  Fall Risk   Falls in the past year? 0 1 0 0 0  Number falls in past yr: 0 1 0 0 0  Injury with Fall? 0 0 0 0 0  Risk for fall due to : Medication side effect History of fall(s) No Fall Risks Medication side effect;Impaired mobility;Impaired balance/gait No Fall Risks  Follow up Falls evaluation completed;Falls prevention discussed Falls evaluation completed Falls evaluation completed Falls prevention discussed;Falls evaluation completed Falls evaluation completed    MEDICARE RISK AT HOME:  Medicare Risk at Home Any stairs in or around the home?: Yes If so, are there any without handrails?: No Home free of loose throw rugs in walkways, pet beds,  electrical cords, etc?: Yes Adequate lighting in your home to reduce risk of falls?: Yes Life alert?: No Use of a cane, walker or w/c?: No Grab bars in the bathroom?: No Shower chair or bench in shower?: No Elevated toilet seat or a handicapped toilet?: No  TIMED UP AND GO:  Was the test performed?  No  Cognitive Function: 6CIT completed        08/03/2024    3:25 PM 06/17/2023    8:22 AM 05/08/2022    2:55 PM 04/24/2021    9:42 AM 01/25/2020    3:28 PM  6CIT Screen  What Year? 0 points 0 points 0 points 0 points 0 points  What month? 0 points 0 points 0 points 0 points 0 points  What time? 0 points 0 points 0 points 3 points 0 points  Count back from 20 0 points 0 points 0 points 0 points 0 points  Months in reverse 2 points 4 points 2 points 0 points 0 points  Repeat phrase 0 points 2 points 4 points 6 points 2 points  Total Score 2 points 6 points 6 points 9 points 2 points    Immunizations Immunization History  Administered Date(s) Administered   Influenza-Unspecified 09/10/2020  PFIZER(Purple Top)SARS-COV-2 Vaccination 01/12/2020, 02/07/2020, 09/10/2020   PNEUMOCOCCAL CONJUGATE-20 04/20/2024   Tdap 05/27/2013, 03/21/2015, 05/10/2018, 11/27/2022   Zoster Recombinant(Shingrix ) 05/08/2022    Screening Tests Health Maintenance  Topic Date Due   Mammogram  07/09/2024   OPHTHALMOLOGY EXAM  08/03/2024   COVID-19 Vaccine (4 - 2025-26 season) 08/03/2024 (Originally 06/27/2024)   Zoster Vaccines- Shingrix  (2 of 2) 10/17/2024 (Originally 07/03/2022)   Influenza Vaccine  01/24/2025 (Originally 05/27/2024)   Diabetic kidney evaluation - Urine ACR  11/23/2024   HEMOGLOBIN A1C  01/15/2025   FOOT EXAM  04/20/2025   Diabetic kidney evaluation - eGFR measurement  07/11/2025   Medicare Annual Wellness (AWV)  08/03/2025   Cervical Cancer Screening (HPV/Pap Cotest)  12/03/2026   Fecal DNA (Cologuard)  12/22/2026   DTaP/Tdap/Td (5 - Td or Tdap) 11/27/2032   Pneumococcal Vaccine: 50+  Years  Completed   DEXA SCAN  Completed   Hepatitis C Screening  Completed   HIV Screening  Completed   Hepatitis B Vaccines 19-59 Average Risk  Aged Out   Meningococcal B Vaccine  Aged Out   Colonoscopy  Discontinued    Health Maintenance Items Addressed: Has an appointment coming soon.  Additional Screening:  Vision Screening: Recommended annual ophthalmology exams for early detection of glaucoma and other disorders of the eye. Is the patient up to date with their annual eye exam?  Yes  Who is the provider or what is the name of the office in which the patient attends annual eye exams? [facility]  Dental Screening: Recommended annual dental exams for proper oral hygiene  Community Resource Referral / Chronic Care Management: CRR required this visit?  No   CCM required this visit?  No   Plan:    I have personally reviewed and noted the following in the patient's chart:   Medical and social history Use of alcohol, tobacco or illicit drugs  Current medications and supplements including opioid prescriptions. Patient is not currently taking opioid prescriptions. Functional ability and status Nutritional status Physical activity Advanced directives List of other physicians Hospitalizations, surgeries, and ER visits in previous 12 months Vitals Screenings to include cognitive, depression, and falls Referrals and appointments  In addition, I have reviewed and discussed with patient certain preventive protocols, quality metrics, and best practice recommendations. A written personalized care plan for preventive services as well as general preventive health recommendations were provided to patient.   Margaret FORBES Dawn, LPN   89/10/7972   After Visit Summary: (In Person-Printed) AVS printed and given to the patient  Notes: Nothing significant to report at this time.

## 2024-08-16 ENCOUNTER — Ambulatory Visit

## 2024-08-16 ENCOUNTER — Ambulatory Visit
Admission: RE | Admit: 2024-08-16 | Discharge: 2024-08-16 | Disposition: A | Discharge status: Home / Self Care | Source: Ambulatory Visit | Attending: Nurse Practitioner | Admitting: Nurse Practitioner

## 2024-08-16 DIAGNOSIS — Z1231 Encounter for screening mammogram for malignant neoplasm of breast: Secondary | ICD-10-CM | POA: Diagnosis not present

## 2024-09-03 ENCOUNTER — Other Ambulatory Visit: Payer: Self-pay | Admitting: Nurse Practitioner

## 2024-09-13 ENCOUNTER — Encounter: Payer: Self-pay | Admitting: Internal Medicine

## 2024-09-13 ENCOUNTER — Ambulatory Visit: Attending: Internal Medicine | Admitting: Internal Medicine

## 2024-10-16 NOTE — Progress Notes (Signed)
 Pharmacy Quality Measure Review  This patient is appearing on a report for being at risk of failing the adherence measure for hypertension (ACEi/ARB) medications this calendar year.   Medication: losartan  50 mg Last fill date: 10/14/2024 for 90 day supply  Insurance report was not up to date. No action needed at this time.   Woodie Jock, PharmD PGY1 Pharmacy Resident  10/16/2024

## 2024-10-25 NOTE — Progress Notes (Unsigned)
 " Cardiology Office Note   Date:  10/26/2024  ID:  Margaret Webster, Margaret Webster [birth year], MRN [id number] PCP: Georgina Speaks, FNP  [facility] Providers Cardiologist:  Stanly DELENA Leavens, MD     PMH CVA Statin myalgias Dyslipidemia Tobacco abuse Type 2 diabetes Hypertension  Referred to cardiology and seen by Dr. Leavens on 12/04/2021 for evaluation of sinus bradycardia.  She was not on AV nodal blocking agents.  No palpitations or irregular beats noted.  Had been feeling fatigued but this had improved following recovery from COVID-19.  No presyncope or syncope.  Was advised to start Zetia  10 mg daily and was referred for PCSK9 inhibitor therapy.  Echo revealed normal LVEF 60 to 65%, no RWMA, mild LVH, normal diastolic parameters, normal RV, mild aortic valve stenosis.  Seen by Dr. Mona in Advanced lipid disorders clinic on 01/21/2022.  Most recent lipid profile showed total cholesterol 329, HDL 52, triglycerides 177 and LDL 242.  Family history of heart disease in her parents but no clear history of elevated cholesterol.  She was started on Repatha  140 mg every 14 days in addition to ezetimibe .  Seen in follow-up 10/17/2022 with significant improvement in lipids with LDL particle number of 1322, LDL-C 90, HDL-C 60, and triglycerides 100.  Small LDL particle number was mildly elevated at 780.  She had some difficulty remembering Repatha  dosing and sometimes gets schedule.  She was also on Nexletol  and ezetimibe .  She was working on weight loss and considering GLP-1 agonist.  Recommendation to repeat lipids in 6 months.  History of Present Illness Discussed the use of AI scribe software for clinical note transcription with the patient, who gave verbal consent to proceed.  History of Present Illness Margaret Webster is a very pleasant 63 year old female who is here today for follow-up of hyperlipidemia. She resumed Repatha  injections about 1 to 2 months ago for cholesterol management.  Her LDL was 139 mg/dL a year ago and 89 mg/dL in September. She recalls prior use of a long, flat, white pill that she no longer has.  She had recent cataract surgery and has having a slight headache and discomfort in her right eye since that time.  She is being followed by her surgeon. Her diet includes whole grains and Wisdom rice. She is reducing canned vegetables and trying to eat more mixed vegetables, though she has difficulty with green vegetables. 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 Admits she needs to resume gym activity and would like to consider joining a [employer] membership.  She denies chest pain, dyspnea, palpitations, orthopnea, PND, edema, presyncope, syncope. She has one daughter who lives close by and 3 granddaughters, one of whom serves in the [employer].    ROS: See HPI  Studies Reviewed       Lipoprotein (a)  Date/Time Value Ref Range Status  01/21/2022 09:46 AM 145.2 (H) <75.0 nmol/L Final    Comment:    Note:  Values greater than or equal to 75.0 nmol/L may        indicate an independent risk factor for CHD,        but must be evaluated with caution when applied        to non-Caucasian populations due to the        influence of genetic factors on Lp(a) across        ethnicities.     Risk Assessment/Calculations  HYPERTENSION CONTROL Vitals:   10/26/24 0759 10/26/24 0852  BP: (!) 160/82 (!) 160/74    The patient's blood pressure is elevated above target today.  In order to address the patient's elevated BP: Blood pressure will be monitored at home to determine if medication changes need to be made.          Physical Exam VS:  BP (!) 160/74   Pulse (!) 58   Ht 5' 7 (1.702 m)   Wt 217 lb (98.4 kg)   SpO2 96%   BMI 33.99 kg/m    Wt Readings from Last 3 Encounters:  10/26/24 217 lb (98.4 kg)  07/18/24 204 lb 6.4 oz (92.7 kg)  07/11/24 218 lb (98.9 kg)    GEN: Well nourished, well  developed in no acute distress NECK: No JVD; No carotid bruits CARDIAC: RRR, no murmurs, rubs, gallops RESPIRATORY:  Clear to auscultation without rales, wheezing or rhonchi  ABDOMEN: Soft, non-tender, non-distended EXTREMITIES:  No edema; No deformity   Assessment & Plan Hyperlipidemia LDL goal < 70 Statin intolerance History of stroke Cardiac risk Lipid panel completed 07/18/2024 with total cholesterol 177, triglycerides 252, HDL 46, and LDL-C 89.  She is unsure if she is taking Zetia .  She is tolerating Repatha  injections without concerning side effects.  Advised due to history of stroke, LDL goal is 70 or lower.  Will have her resume Zetia  along with continuing Repatha  and repeat lipids in 2 to 3 months. She denies chest pain, dyspnea, or other symptoms concerning for angina.  No indication for further ischemic evaluation at this time. Advised CT calcium  score for further risk stratification. Reports she needs to save money for this test and will consider in the future.  -Resume Zetia  10 mg daily -Continue Repatha  140 mg every 14 days -Lipid panel and ALT in 2 to 3 months - Consider CT calcium  score for further risk stratification in the future  Type 2 diabetes mellitus  Obesity  A1C of 5.9% on 07/18/24, improved from 6.3 in 2024. She is on metformin  and Ozempic . Blood sugar and triglycerides are elevated, likely due to diet.  - Continue metformin  and Ozempic ; management per PCP - Encouraged dietary modifications to reduce simple carbohydrates and increase whole grains as well as avoid processed foods and saturated fat - Be as physically active as possible every day and aim for at least 150 minutes of moderate intensity exercise each week, specifically walking for 10 minutes after meals may help reduce blood sugar and triglycerides  Hypertension  Medication management BP is  elevated and remains elevated on my recheck at 160/74 mmHg.  She reports compliance with medications but had a  question about one medication not being in her pill pack recently.  She thinks it was Zetia  but will double check at home. Reports BP is generally better controlled.  She does have a headache this morning that she thinks is secondary to recent cataract surgery and problems with her right eye.  No change in antihypertensive therapy today. Renal function stable on labs completed 07/11/24. - Verify home medications with our list - Recommend regular home BP monitoring - Continue losartan , amlodipine  - Management per PCP        Dispo: 1 year with Dr. Mona or me  Signed, Rosaline Bane, NP-C "

## 2024-10-26 ENCOUNTER — Encounter (HOSPITAL_BASED_OUTPATIENT_CLINIC_OR_DEPARTMENT_OTHER): Payer: Self-pay | Admitting: Nurse Practitioner

## 2024-10-26 ENCOUNTER — Ambulatory Visit (INDEPENDENT_AMBULATORY_CARE_PROVIDER_SITE_OTHER): Admitting: Nurse Practitioner

## 2024-10-26 VITALS — BP 160/74 | HR 58 | Ht 67.0 in | Wt 217.0 lb

## 2024-10-26 DIAGNOSIS — Z8673 Personal history of transient ischemic attack (TIA), and cerebral infarction without residual deficits: Secondary | ICD-10-CM

## 2024-10-26 DIAGNOSIS — E6609 Other obesity due to excess calories: Secondary | ICD-10-CM

## 2024-10-26 DIAGNOSIS — Z789 Other specified health status: Secondary | ICD-10-CM | POA: Diagnosis not present

## 2024-10-26 DIAGNOSIS — Z7189 Other specified counseling: Secondary | ICD-10-CM | POA: Diagnosis not present

## 2024-10-26 DIAGNOSIS — Z79899 Other long term (current) drug therapy: Secondary | ICD-10-CM

## 2024-10-26 DIAGNOSIS — E785 Hyperlipidemia, unspecified: Secondary | ICD-10-CM | POA: Diagnosis not present

## 2024-10-26 DIAGNOSIS — E782 Mixed hyperlipidemia: Secondary | ICD-10-CM | POA: Diagnosis not present

## 2024-10-26 DIAGNOSIS — Z6833 Body mass index (BMI) 33.0-33.9, adult: Secondary | ICD-10-CM | POA: Diagnosis not present

## 2024-10-26 DIAGNOSIS — I10 Essential (primary) hypertension: Secondary | ICD-10-CM | POA: Diagnosis not present

## 2024-10-26 DIAGNOSIS — E1122 Type 2 diabetes mellitus with diabetic chronic kidney disease: Secondary | ICD-10-CM

## 2024-10-26 DIAGNOSIS — N1831 Chronic kidney disease, stage 3a: Secondary | ICD-10-CM | POA: Diagnosis not present

## 2024-10-26 DIAGNOSIS — E66811 Obesity, class 1: Secondary | ICD-10-CM | POA: Diagnosis not present

## 2024-10-26 MED ORDER — EZETIMIBE 10 MG PO TABS: 10.0000 mg | ORAL_TABLET | Freq: Every day | ORAL | 3 refills | Status: AC

## 2024-10-26 MED ORDER — REPATHA SURECLICK 140 MG/ML ~~LOC~~ SOAJ: 140.0000 mg | SUBCUTANEOUS | 3 refills | Status: AC

## 2024-10-26 NOTE — Patient Instructions (Signed)
 Medication Instructions:   RESTART Zetia  Take 1 tablet (10 mg total) by mouth daily. - Oral   *If you need a refill on your cardiac medications before your next appointment, please call your pharmacy*  Lab Work:  Your physician recommends that you return for a FASTING lipid profile/alt in 3 months, fasting after midnight. Patient given paperwork today.    If you have labs (blood work) drawn today and your tests are completely normal, you will receive your results only by: MyChart Message (if you have MyChart) OR A paper copy in the mail If you have any lab test that is abnormal or we need to change your treatment, we will call you to review the results.  Testing/Procedures:  Margaret Webster PIETY wants you to think about having this test,  CT coronary calcium  score.   Test locations:  [facility] at [facility] [place]  [place] [facility] at [facility]  This is $99 out of pocket.   Coronary CalciumScan A coronary calcium  scan is an imaging test used to look for deposits of calcium  and other fatty materials (plaques) in the inner lining of the blood vessels of the heart (coronary arteries). These deposits of calcium  and plaques can partly clog and narrow the coronary arteries without producing any symptoms or warning signs. This puts a person at risk for a heart attack. This test can detect these deposits before symptoms develop. Tell a health care provider about: Any allergies you have. All medicines you are taking, including vitamins, herbs, eye drops, creams, and over-the-counter medicines. Any problems you or family members have had with anesthetic medicines. Any blood disorders you have. Any surgeries you have had. Any medical conditions you have. Whether you are pregnant or may be pregnant. What are the risks? Generally, this is a safe procedure. However, problems may occur,  including: Harm to a pregnant woman and her unborn baby. This test involves the use of radiation. Radiation exposure can be dangerous to a pregnant woman and her unborn baby. If you are pregnant, you generally should not have this procedure done. Slight increase in the risk of cancer. This is because of the radiation involved in the test. What happens before the procedure? No preparation is needed for this procedure. What happens during the procedure? You will undress and remove any jewelry around your neck or chest. You will put on a hospital gown. Sticky electrodes will be placed on your chest. The electrodes will be connected to an electrocardiogram (ECG) machine to record a tracing of the electrical activity of your heart. A CT scanner will take pictures of your heart. During this time, you will be asked to lie still and hold your breath for 2-3 seconds while a picture of your heart is being taken. The procedure may vary among health care providers and hospitals. What happens after the procedure? You can get dressed. You can return to your normal activities. It is up to you to get the results of your test. Ask your health care provider, or the department that is doing the test, when your results will be ready. Summary A coronary calcium  scan is an imaging test used to look for deposits of calcium  and other fatty materials (plaques) in the inner lining of the blood vessels of the heart (coronary arteries). Generally, this is a safe procedure. Tell your health care provider if you are pregnant or may be pregnant. No preparation is needed for this  procedure. A CT scanner will take pictures of your heart. You can return to your normal activities after the scan is done. This information is not intended to replace advice given to you by your health care provider. Make sure you discuss any questions you have with your health care provider. Document Released: 04/10/2008 Document Revised: 09/01/2016  Document Reviewed: 09/01/2016 Elsevier Interactive Patient Education  2017 [employer].    Follow-Up: At [facility], you and your health needs are our priority.  As part of our continuing mission to provide you with exceptional heart care, our providers are all part of one team.  This team includes your primary Cardiologist (physician) and Advanced Practice Providers or APPs (Physician Assistants and Nurse Practitioners) who all work together to provide you with the care you need, when you need it.  Your next appointment:   1 year(s)  Provider:   Rosaline Bane, NP    We recommend signing up for the patient portal called MyChart.  Sign up information is provided on this After Visit Summary.  MyChart is used to connect with patients for Virtual Visits (Telemedicine).  Patients are able to view lab/test results, encounter notes, upcoming appointments, etc.  Non-urgent messages can be sent to your provider as well.   To learn more about what you can do with MyChart, go to [email or web address].   Other Instructions  Your physician wants you to follow-up in: 1 year.  You will receive a reminder letter in the mail two months in advance. If you don't receive a letter, please call our office to schedule the follow-up appointment.  Adopting a Healthy Lifestyle.   Weight: Know what a healthy weight is for you (roughly BMI <25) and aim to maintain this. You can calculate your body mass index on your smart phone. Unfortunately, this is not the most accurate measure of healthy weight, but it is the simplest measurement to use. A more accurate measurement involves body scanning which measures lean muscle, fat tissue and bony density. We do not have this equipment at [facility].    Diet: Aim for 7+ servings of fruits and vegetables daily Limit animal fats in diet for cholesterol and heart health - choose grass fed whenever available Avoid highly processed foods (fast food burgers,  tacos, fried chicken, pizza, hot dogs, french fries)  Saturated fat comes in the form of butter, lard, coconut oil, margarine, partially hydrogenated oils, dairy products, and fat in meat. These increase your risk of cardiovascular disease.  Use healthy plant oils, such as olive, canola, soy, corn, sunflower and peanut.  Whole foods such as fruits, vegetables and whole grains have fiber  Men need > 38 grams of fiber per day Women need > 25 grams of fiber per day  Load up on vegetables and fruits - one-half of your plate: Aim for color and variety, and remember that potatoes dont count. Go for whole grains - one-quarter of your plate: Whole wheat, barley, wheat berries, quinoa, oats, Hidrogo rice, and foods made with them. If you want pasta, go with whole wheat pasta. Protein power - one-quarter of your plate: Fish, chicken, beans, and nuts are all healthy, versatile protein sources. Limit red meat. You need carbohydrates for energy! The type of carbohydrate is more important than the amount. Choose carbohydrates such as vegetables, fruits, whole grains, beans, and nuts in the place of white rice, white pasta, potatoes (baked or fried), macaroni and cheese, cakes, cookies, and donuts.  If youre thirsty, drink water. Coffee  and tea are good in moderation, but skip sugary drinks and limit milk and dairy products to one or two daily servings. Keep sugar intake at 6 teaspoons or 24 grams or LESS       Exercise: Aim for 150 min of moderate intensity exercise weekly for heart health, and weights twice weekly for bone health Stay active - any steps are better than no steps! Aim for 7-9 hours of sleep daily   Sleep: This provides your body with the reset and relaxation that it needs!  Aim to get 7-8 hours of sleep each night. Limit caffeine, screen time, and other distractions prior to bedtime.  Keep your bedroom cool and dark and do not wear heavy clothing to bed or use heavy bed covers - layer if  needed.

## 2024-11-02 ENCOUNTER — Other Ambulatory Visit: Payer: Self-pay | Admitting: Nurse Practitioner

## 2024-11-02 DIAGNOSIS — R7303 Prediabetes: Secondary | ICD-10-CM

## 2024-11-09 ENCOUNTER — Ambulatory Visit

## 2025-02-23 ENCOUNTER — Ambulatory Visit: Admitting: Dermatology

## 2025-04-24 ENCOUNTER — Encounter: Payer: Self-pay | Admitting: Nurse Practitioner

## 2025-09-06 ENCOUNTER — Ambulatory Visit: Payer: Self-pay
